# Patient Record
Sex: Female | Born: 1935 | Race: White | Hispanic: No | Marital: Single | Smoking: Never smoker
Health system: Southern US, Community
[De-identification: ages and names within clinical notes are randomized; demographics above are authoritative.]

## PROBLEM LIST (undated history)

## (undated) DIAGNOSIS — M797 Fibromyalgia: Secondary | ICD-10-CM

## (undated) DIAGNOSIS — K219 Gastro-esophageal reflux disease without esophagitis: Secondary | ICD-10-CM

## (undated) DIAGNOSIS — R5382 Chronic fatigue, unspecified: Secondary | ICD-10-CM

## (undated) DIAGNOSIS — M1990 Unspecified osteoarthritis, unspecified site: Secondary | ICD-10-CM

## (undated) DIAGNOSIS — E119 Type 2 diabetes mellitus without complications: Secondary | ICD-10-CM

## (undated) DIAGNOSIS — M810 Age-related osteoporosis without current pathological fracture: Secondary | ICD-10-CM

## (undated) DIAGNOSIS — G4730 Sleep apnea, unspecified: Secondary | ICD-10-CM

## (undated) DIAGNOSIS — M48061 Spinal stenosis, lumbar region without neurogenic claudication: Secondary | ICD-10-CM

## (undated) DIAGNOSIS — I10 Essential (primary) hypertension: Secondary | ICD-10-CM

## (undated) DIAGNOSIS — K222 Esophageal obstruction: Secondary | ICD-10-CM

## (undated) HISTORY — DX: Sleep apnea, unspecified: G47.30

## (undated) HISTORY — DX: Gastro-esophageal reflux disease without esophagitis: K21.9

## (undated) HISTORY — DX: Fibromyalgia: M79.7

## (undated) HISTORY — DX: Essential (primary) hypertension: I10

## (undated) HISTORY — DX: Unspecified osteoarthritis, unspecified site: M19.90

## (undated) HISTORY — PX: ABDOMINAL HYSTERECTOMY: SHX81

## (undated) HISTORY — DX: Esophageal obstruction: K22.2

## (undated) HISTORY — PX: OTHER SURGICAL HISTORY: SHX169

## (undated) HISTORY — PX: ROTATOR CUFF REPAIR: SHX139

## (undated) HISTORY — DX: Type 2 diabetes mellitus without complications: E11.9

## (undated) HISTORY — PX: APPENDECTOMY: SHX54

## (undated) HISTORY — DX: Age-related osteoporosis without current pathological fracture: M81.0

## (undated) HISTORY — DX: Chronic fatigue, unspecified: R53.82

## (undated) HISTORY — DX: Spinal stenosis, lumbar region without neurogenic claudication: M48.061

---

## 2004-06-09 ENCOUNTER — Ambulatory Visit: Payer: Self-pay | Admitting: Internal Medicine

## 2004-07-26 ENCOUNTER — Ambulatory Visit: Payer: Self-pay | Admitting: Internal Medicine

## 2004-08-07 ENCOUNTER — Ambulatory Visit: Payer: Self-pay | Admitting: Internal Medicine

## 2004-09-06 ENCOUNTER — Ambulatory Visit: Payer: Self-pay | Admitting: Internal Medicine

## 2004-10-07 ENCOUNTER — Ambulatory Visit: Payer: Self-pay | Admitting: Internal Medicine

## 2004-12-14 ENCOUNTER — Ambulatory Visit: Payer: Self-pay | Admitting: Internal Medicine

## 2005-06-24 ENCOUNTER — Ambulatory Visit: Payer: Self-pay | Admitting: Internal Medicine

## 2006-06-26 ENCOUNTER — Ambulatory Visit: Payer: Self-pay | Admitting: Internal Medicine

## 2007-01-31 ENCOUNTER — Ambulatory Visit: Payer: Self-pay | Admitting: Internal Medicine

## 2007-06-28 ENCOUNTER — Ambulatory Visit: Payer: Self-pay | Admitting: Internal Medicine

## 2007-08-02 ENCOUNTER — Ambulatory Visit: Payer: Self-pay | Admitting: Podiatry

## 2008-05-20 ENCOUNTER — Ambulatory Visit: Payer: Self-pay | Admitting: Unknown Physician Specialty

## 2008-05-27 ENCOUNTER — Ambulatory Visit: Payer: Self-pay | Admitting: Unknown Physician Specialty

## 2008-06-26 ENCOUNTER — Ambulatory Visit: Payer: Self-pay | Admitting: Unknown Physician Specialty

## 2009-02-04 ENCOUNTER — Ambulatory Visit: Payer: Self-pay | Admitting: Pain Medicine

## 2009-02-11 ENCOUNTER — Ambulatory Visit: Payer: Self-pay | Admitting: Pain Medicine

## 2009-03-17 ENCOUNTER — Ambulatory Visit: Payer: Self-pay | Admitting: Pain Medicine

## 2009-03-23 ENCOUNTER — Ambulatory Visit: Payer: Self-pay | Admitting: Pain Medicine

## 2009-04-01 ENCOUNTER — Emergency Department: Payer: Self-pay | Admitting: Emergency Medicine

## 2009-04-28 ENCOUNTER — Ambulatory Visit: Payer: Self-pay | Admitting: Pain Medicine

## 2009-05-06 ENCOUNTER — Ambulatory Visit: Payer: Self-pay | Admitting: Pain Medicine

## 2009-05-28 ENCOUNTER — Ambulatory Visit: Payer: Self-pay | Admitting: Internal Medicine

## 2009-06-09 ENCOUNTER — Ambulatory Visit: Payer: Self-pay | Admitting: Pain Medicine

## 2009-06-15 ENCOUNTER — Ambulatory Visit: Payer: Self-pay | Admitting: Pain Medicine

## 2009-07-21 ENCOUNTER — Ambulatory Visit: Payer: Self-pay | Admitting: Pain Medicine

## 2009-07-27 ENCOUNTER — Ambulatory Visit: Payer: Self-pay | Admitting: Pain Medicine

## 2009-10-20 ENCOUNTER — Ambulatory Visit: Payer: Self-pay | Admitting: Pain Medicine

## 2009-10-26 ENCOUNTER — Ambulatory Visit: Payer: Self-pay | Admitting: Pain Medicine

## 2009-12-01 ENCOUNTER — Ambulatory Visit: Payer: Self-pay | Admitting: Pain Medicine

## 2010-05-31 ENCOUNTER — Ambulatory Visit: Payer: Self-pay | Admitting: Internal Medicine

## 2010-09-09 ENCOUNTER — Ambulatory Visit: Payer: Self-pay | Admitting: Unknown Physician Specialty

## 2010-10-25 ENCOUNTER — Ambulatory Visit: Payer: Self-pay | Admitting: Pain Medicine

## 2010-11-30 ENCOUNTER — Ambulatory Visit: Payer: Self-pay | Admitting: Pain Medicine

## 2010-12-06 ENCOUNTER — Ambulatory Visit: Payer: Self-pay | Admitting: Pain Medicine

## 2010-12-21 ENCOUNTER — Ambulatory Visit: Payer: Self-pay

## 2010-12-28 ENCOUNTER — Ambulatory Visit: Payer: Self-pay | Admitting: Pain Medicine

## 2010-12-31 ENCOUNTER — Ambulatory Visit: Payer: Self-pay | Admitting: Unknown Physician Specialty

## 2011-01-04 ENCOUNTER — Ambulatory Visit: Payer: Self-pay | Admitting: Unknown Physician Specialty

## 2011-05-25 ENCOUNTER — Ambulatory Visit: Payer: Self-pay | Admitting: Unknown Physician Specialty

## 2011-06-09 ENCOUNTER — Ambulatory Visit: Payer: Self-pay | Admitting: Internal Medicine

## 2011-12-27 ENCOUNTER — Ambulatory Visit: Payer: Self-pay | Admitting: Unknown Physician Specialty

## 2012-01-20 ENCOUNTER — Other Ambulatory Visit: Payer: Self-pay | Admitting: Unknown Physician Specialty

## 2012-01-20 ENCOUNTER — Ambulatory Visit: Payer: Self-pay | Admitting: Unknown Physician Specialty

## 2012-06-13 ENCOUNTER — Ambulatory Visit: Payer: Self-pay | Admitting: Internal Medicine

## 2013-03-11 ENCOUNTER — Ambulatory Visit: Payer: Self-pay | Admitting: Physical Medicine and Rehabilitation

## 2013-07-18 ENCOUNTER — Ambulatory Visit: Payer: Self-pay | Admitting: Ophthalmology

## 2013-07-18 LAB — HEMOGLOBIN: HGB: 12.4 g/dL (ref 12.0–16.0)

## 2013-07-30 ENCOUNTER — Ambulatory Visit: Payer: Self-pay | Admitting: Ophthalmology

## 2013-09-18 DIAGNOSIS — M48061 Spinal stenosis, lumbar region without neurogenic claudication: Secondary | ICD-10-CM | POA: Insufficient documentation

## 2013-09-18 DIAGNOSIS — M48062 Spinal stenosis, lumbar region with neurogenic claudication: Secondary | ICD-10-CM | POA: Insufficient documentation

## 2013-10-03 ENCOUNTER — Ambulatory Visit: Payer: Self-pay | Admitting: Ophthalmology

## 2013-10-03 LAB — HEMOGLOBIN: HGB: 13 g/dL (ref 12.0–16.0)

## 2013-10-08 ENCOUNTER — Ambulatory Visit: Payer: Self-pay | Admitting: Ophthalmology

## 2013-12-23 DIAGNOSIS — E119 Type 2 diabetes mellitus without complications: Secondary | ICD-10-CM | POA: Insufficient documentation

## 2013-12-23 DIAGNOSIS — E538 Deficiency of other specified B group vitamins: Secondary | ICD-10-CM | POA: Insufficient documentation

## 2013-12-23 DIAGNOSIS — I10 Essential (primary) hypertension: Secondary | ICD-10-CM | POA: Insufficient documentation

## 2013-12-23 DIAGNOSIS — G4733 Obstructive sleep apnea (adult) (pediatric): Secondary | ICD-10-CM | POA: Insufficient documentation

## 2014-04-14 DIAGNOSIS — M5030 Other cervical disc degeneration, unspecified cervical region: Secondary | ICD-10-CM | POA: Insufficient documentation

## 2014-08-30 NOTE — Op Note (Signed)
PATIENT NAME:  Loretta Webb, Loretta Webb MR#:  [id number] DATE OF BIRTH:  [birth year]  DATE OF PROCEDURE:  10/08/2013  PREOPERATIVE DIAGNOSIS: Visually significant cataract of the right eye.   POSTOPERATIVE DIAGNOSIS: Visually significant cataract of the right eye.   OPERATIVE PROCEDURE: Cataract extraction by phacoemulsification with implant of intraocular lens to right eye.   SURGEON: Galen ManilaWilliam Easten Maceachern, MD.   ANESTHESIA:  1.  Managed anesthesia care.  2.  Topical tetracaine drops followed by 2% Xylocaine jelly applied in the preoperative holding area.   COMPLICATIONS: None.   TECHNIQUE:  Stop and chop.  DESCRIPTION OF PROCEDURE: The patient was examined and consented in the preoperative holding area where the aforementioned topical anesthesia was applied to the right eye and then brought back to the Operating Room where the right eye was prepped and draped in the usual sterile ophthalmic fashion and a lid speculum was placed. A paracentesis was created with the side port blade and the anterior chamber was filled with viscoelastic. A near clear corneal incision was performed with the steel keratome. A continuous curvilinear capsulorrhexis was performed with a cystotome followed by the capsulorrhexis forceps. Hydrodissection and hydrodelineation were carried out with BSS on a blunt cannula. The lens was removed in a stop and chop technique and the remaining cortical material was removed with the irrigation-aspiration handpiece. The capsular bag was inflated with viscoelastic and the Tecnis ZCB00 22.5-diopter lens, serial number [id number] was placed in the capsular bag without complication. The remaining viscoelastic was removed from the eye with the irrigation-aspiration handpiece. The wounds were hydrated. The anterior chamber was flushed with Miostat and the eye was inflated to physiologic pressure. 0.1 mL of cefuroxime concentration 10 mg/mL was placed in the anterior chamber. The wounds were found to be  water tight. The eye was dressed with Vigamox. The patient was given protective glasses to wear throughout the day and a shield with which to sleep tonight. The patient was also given drops with which to begin a drop regimen today and will follow-up with me in one day.      ____________________________ Loretta FieldWilliam L. Tyrick Dunagan, MD wlp:dmm D: 10/08/2013 21:27:15 ET T: 10/08/2013 21:44:24 ET JOB#: [id number]  cc: Loretta Sandefer L. Reha Martinovich, MD, <Dictator> Loretta FieldWILLIAM L Felecia Stanfill MD ELECTRONICALLY SIGNED 10/09/2013 13:44

## 2014-08-30 NOTE — Op Note (Signed)
PATIENT NAME:  Loretta ChessmanHUMBLE, Kennedy S MR#:  [id number] DATE OF BIRTH:  February 27, 1932  DATE OF PROCEDURE:  07/30/2013  PREOPERATIVE DIAGNOSIS: Visually significant cataract of the left eye.   POSTOPERATIVE DIAGNOSIS: Visually significant cataract of the left eye.   OPERATIVE PROCEDURE: Cataract extraction by phacoemulsification with implant of intraocular lens to the left eye.   SURGEON: Galen ManilaWilliam Della Homan, MD.   ANESTHESIA:  1. Managed anesthesia care.  2. Topical tetracaine drops followed by 2% Xylocaine jelly applied in the preoperative holding area.   COMPLICATIONS: None.   TECHNIQUE:  Stop and chop.   DESCRIPTION OF PROCEDURE: The patient was examined and consented in the preoperative holding area where the aforementioned topical anesthesia was applied to the left eye and then brought back to the Operating Room where the left eye was prepped and draped in the usual sterile ophthalmic fashion and a lid speculum was placed. A paracentesis was created with the side port blade and the anterior chamber was filled with viscoelastic. A near clear corneal incision was performed with the steel keratome. A continuous curvilinear capsulorrhexis was performed with a cystotome followed by the capsulorrhexis forceps. Hydrodissection and hydrodelineation were carried out with BSS on a blunt cannula. The lens was removed in a stop and chop technique and the remaining cortical material was removed with the irrigation-aspiration handpiece. The capsular bag was inflated with viscoelastic and the Tecnis ZCB00 22.0-diopter lens, serial number [id number] was placed in the capsular bag without complication. The remaining viscoelastic was removed from the eye with the irrigation-aspiration handpiece. The wounds were hydrated. The anterior chamber was flushed with Miostat and the eye was inflated to physiologic pressure. 0.1 mL of cefuroxime concentration 10 mg/mL was placed in the anterior chamber. The wounds were found to be  water tight. The eye was dressed with Vigamox. The patient was given protective glasses to wear throughout the day and a shield with which to sleep tonight. The patient was also given drops with which to begin a drop regimen today and will follow-up with me in one day.   ____________________________ Jerilee FieldWilliam L. Kirat Mezquita, MD wlp:gb D: 07/30/2013 22:41:54 ET T: 07/30/2013 23:12:15 ET JOB#: [id number]  cc: [place] L. Kanani Mowbray, MD, <Dictator> Jerilee FieldWILLIAM L Quanetta Truss MD ELECTRONICALLY SIGNED 08/01/2013 11:26

## 2014-09-09 ENCOUNTER — Encounter: Payer: Self-pay | Admitting: Pulmonary Disease

## 2014-11-19 NOTE — Discharge Summary (Signed)
PATIENT NAME:  Loretta ChessmanHUMBLE, Loretta Webb MR#:  [id number] DATE OF BIRTH:  December 02, 1931  DATE OF ADMISSION:  01/20/2012 DATE OF DISCHARGE:  01/20/2012  HISTORY:: The patient is a [age] year old white female who came in for dysphagia to have an upper endoscopy and dilatation. She has a history of diabetes, sleep apnea, and hypertension. No history of intrinsic heart disease. During the evaluation phase, after vital signs were taken and an IV was inserted, and she was hooked up to the monitor, it was noted that she had significant ST depression and T wave inversion.  She was asymptomatic at the time. I consulted Dr. Darrold JunkerParaschos and attempted to contact her primary doctor, Dr. Bethann PunchesMark Miller. Plan was made to cancel the procedure. I gave her a copy of her EKG and the nurses will schedule an appointment to see Dr. Darrold JunkerParaschos next week. He did not feel that she needed urgent attention in the absence of symptoms. ____________________________ Scot Junobert T. Elliott, MD rte:slb D: 01/20/2012 08:59:08 ET T: 01/20/2012 10:06:10 ET JOB#: [id number]  cc: Danella PentonMark F. Miller, MD Marcina MillardAlexander Paraschos, MD Scot Junobert T. Elliott, MD, <Dictator> Scot JunOBERT T ELLIOTT MD ELECTRONICALLY SIGNED 01/23/2012 16:08

## 2015-01-05 DIAGNOSIS — M5116 Intervertebral disc disorders with radiculopathy, lumbar region: Secondary | ICD-10-CM | POA: Insufficient documentation

## 2015-04-08 ENCOUNTER — Encounter: Payer: Self-pay | Admitting: Pulmonary Disease

## 2015-04-08 ENCOUNTER — Encounter (INDEPENDENT_AMBULATORY_CARE_PROVIDER_SITE_OTHER): Payer: Self-pay

## 2015-04-08 ENCOUNTER — Ambulatory Visit (INDEPENDENT_AMBULATORY_CARE_PROVIDER_SITE_OTHER): Payer: Medicare Other | Admitting: Pulmonary Disease

## 2015-04-08 VITALS — BP 122/62 | HR 63 | Ht 64.0 in | Wt 165.0 lb

## 2015-04-08 DIAGNOSIS — R0902 Hypoxemia: Secondary | ICD-10-CM

## 2015-04-08 DIAGNOSIS — R5382 Chronic fatigue, unspecified: Secondary | ICD-10-CM | POA: Diagnosis not present

## 2015-04-08 DIAGNOSIS — I272 Other secondary pulmonary hypertension: Secondary | ICD-10-CM | POA: Diagnosis not present

## 2015-04-08 DIAGNOSIS — G4733 Obstructive sleep apnea (adult) (pediatric): Secondary | ICD-10-CM

## 2015-04-08 NOTE — Progress Notes (Signed)
PULMONARY CONSULT NOTE  Requesting: Self referred  Date of initial consult: 04/08/15 Reason for consultation: Second opinion  HPI:  [age] F who has been followed by Dr Meredeth IdeFleming "for years" for poorly explained dyspnea and OSA first diagnosed 8-10 yrs ago. It seems that the desire for this second opinion is on her daughter's insistence. Ms Ulyses AmorHumble was diagnosed with OSA many years ago and has been intolerant to CPAP. She wears nocturnal O2. She has no symptoms of daytime hypersomnolence but her daughter notes that she is a heavy snorer and the pt does report chronic fatigue limiting her ability to complete all of her daily routine. Iin fact, she carries a diagnosis of chronic fatigue syndrome and fibromyalgia syndrome. She is followed by Dr Bethann PunchesMark Miller for these problems. She has undergone an echocardiogram (apparently to evaluate chronic exertional dyspnea) which revealed pulmonary hypertension and was told by Dr Meredeth IdeFleming that there was no indication for medical therapy. She has undergone overnight oximetry and ambulatory oximetry testing. Her daughter reports that her SpO2 dropped to 92% with ambulation. The results of the overnight oximetry are not known at this time. Her daughter expresses concern that there has been a lack of communication by Dr Meredeth IdeFleming and this too has prompted this "2nd opinion".   Past Medical History  Diagnosis Date  . Hypertension   . Sleep apnea   . DM2 (diabetes mellitus, type 2) (HCC)   . Arthritis   . Fibromyalgia   . Chronic fatigue   . GERD (gastroesophageal reflux disease)   . Lumbar stenosis   . Osteoporosis   . Esophageal stricture     MEDICATIONS: reviewed  Social History   Social History  . Marital Status: Divorced    Spouse Name: N/A  . Number of Children: N/A  . Years of Education: N/A   Occupational History  . Not on file.   Social History Main Topics  . Smoking status: Never Smoker   . Smokeless tobacco: Not on file  . Alcohol Use: No  . Drug  Use: No  . Sexual Activity: Not on file   Other Topics Concern  . Not on file   Social History Narrative  . No narrative on file    Family History  Problem Relation Age of Onset  . Diabetes Father   . Cancer Mother   . Cancer Father   . Cerebral aneurysm Father   . Cancer Sister   . Depression Sister     ROS - negative except as noted above  Filed Vitals:   04/08/15 1037  BP: 122/62  Pulse: 63  Height: 5\' 4"  (1.626 m)  Weight: 165 lb (74.844 kg)  SpO2: 98%    EXAM:   Gen: WDWN in NAD HEENT: All WNL Neck: NO LAN, no JVD noted Lungs: full BS, normal percussion note throughout, no adventitious sounds Cardiovascular: Reg rate, normal rhythm, no M noted Abdomen: Soft, NT +BS Ext: no C/C/E Neuro: CNs intact, motor/sens grossly intact Skin: No lesions noted    DATA:  CBC Latest Ref Rng 10/03/2013 07/18/2013  Hemoglobin 12.0-16.0 g/dL 16.113.0 09.612.4    No flowsheet data found.  No CXR available for review   IMPRESSION:   OSA - suspect mild. Intolerant to CPAP. Symptomatically controlled with nocturnal O2 Borderline hypoxemia - Plan: DG Chest 2 View, Pulmonary function test Pulmonary hypertension - suspect mild and simply an incidental finding. Repeat echocardiogram already ordered for 04/13/15  PLAN:  We will obtain records from Dr Reita ClicheFleming's office and  overnight oximetry report from [facility] Follow up in 4-6 wks with CXR and PFTs Cont current medical regimen and oxygen therapy as prescribed previously   Billy Fischer, MD PCCM service Mobile [phone] Pager [phone]

## 2015-04-24 ENCOUNTER — Ambulatory Visit (INDEPENDENT_AMBULATORY_CARE_PROVIDER_SITE_OTHER): Payer: Medicare Other | Admitting: Sports Medicine

## 2015-04-24 ENCOUNTER — Encounter: Payer: Self-pay | Admitting: Sports Medicine

## 2015-04-24 DIAGNOSIS — M79672 Pain in left foot: Secondary | ICD-10-CM

## 2015-04-24 DIAGNOSIS — M79671 Pain in right foot: Secondary | ICD-10-CM

## 2015-04-24 DIAGNOSIS — B351 Tinea unguium: Secondary | ICD-10-CM | POA: Diagnosis not present

## 2015-04-24 DIAGNOSIS — L84 Corns and callosities: Secondary | ICD-10-CM

## 2015-04-24 DIAGNOSIS — E1142 Type 2 diabetes mellitus with diabetic polyneuropathy: Secondary | ICD-10-CM

## 2015-04-24 NOTE — Progress Notes (Signed)
Patient ID: Loretta Webb, female   DOB: May 24, 1931, 81 y.o.   MRN: [id number] Subjective: Loretta Webb is a 81 y.o. female patient with history of type 2 diabetes who presents to office today complaining of long, painful nails  while ambulating in shoes; unable to trim. Patient states that the glucose reading this morning was not recorded. Patient denies any new changes in medication or new problems. Patient denies any new cramping, numbness, burning or tingling in the legs.  There are no active problems to display for this patient.  Current Outpatient Prescriptions on File Prior to Visit  Medication Sig Dispense Refill  . ALPRAZolam (XANAX) 0.25 MG tablet Take by mouth as needed.    . bumetanide (BUMEX) 1 MG tablet Take by mouth.    . cyanocobalamin (,VITAMIN B-12,) 1000 MCG/ML injection Inject into the muscle.    . DULoxetine (CYMBALTA) 60 MG capsule Take by mouth.    . gabapentin (NEURONTIN) 300 MG capsule Take by mouth.    Marland Kitchen. HYDROcodone-acetaminophen (NORCO/VICODIN) 5-325 MG tablet Take by mouth as needed.    Marland Kitchen. losartan (COZAAR) 50 MG tablet Take by mouth.    . nabumetone (RELAFEN) 500 MG tablet Take by mouth.    . pravastatin (PRAVACHOL) 40 MG tablet Take by mouth.    . propranolol (INDERAL) 20 MG tablet Take by mouth.    . traZODone (DESYREL) 50 MG tablet Take by mouth.     No current facility-administered medications on file prior to visit.   Not on File   Labs: HEMOGLOBIN A1C-No recent labs on file  Objective: General: Patient is awake, alert, and oriented x 3 and in no acute distress.  Integument: Skin is warm, dry and supple bilateral. Nails are tender, long, thickened and  dystrophic with subungual debris, consistent with onychomycosis, 1-5 bilateral. No signs of infection. No open lesions bilateral. There are callousities at the medial hallux bilateral with no signs of infection. Remaining integument unremarkable.  Vasculature:  Dorsalis Pedis pulse 1/4 bilateral.  Posterior Tibial pulse  1/4 bilateral.  Capillary fill time <4 sec 1-5 bilateral. Scant hair growth to the level of the digits. Temperature gradient within normal limits. Mild varicosities present bilateral. No edema present bilateral.   Neurology: The patient has diminished sensation measured with a 5.07/10g Semmes Weinstein Monofilament at all pedal sites bilateral . Vibratory sensation diminished bilateral with tuning fork. No Babinski sign present bilateral.   Musculoskeletal: No gross pedal deformities noted bilateral. Muscular strength 5/5 in all lower extremity muscular groups bilateral without pain or limitation on range of motion . No tenderness with calf compression bilateral.  Assessment and Plan: Problem List Items Addressed This Visit    None    Visit Diagnoses    Callus of foot    -  Primary    Dermatophytosis of nail        Foot pain, bilateral        Diabetic polyneuropathy associated with type 2 diabetes mellitus (HCC)          -Examined patient. -Discussed and educated patient on diabetic foot care, especially with  regards to the vascular, neurological and musculoskeletal systems.  -Stressed the importance of good glycemic control and the detriment of not  controlling glucose levels in relation to the foot. -Mechanically debrided callus x 2 using sterile chisel blade and all nails 1-5 bilateral using sterile nail nipper and filed with dremel without incident  -Answered all patient questions -Patient to return in 3 months for at  risk foot care -Patient advised to call the office if any problems or questions arise in the meantime.  Landis Martins, DPM

## 2015-05-06 ENCOUNTER — Other Ambulatory Visit: Payer: Self-pay | Admitting: Unknown Physician Specialty

## 2015-05-06 DIAGNOSIS — R1310 Dysphagia, unspecified: Secondary | ICD-10-CM

## 2015-05-08 ENCOUNTER — Ambulatory Visit
Admission: RE | Admit: 2015-05-08 | Discharge: 2015-05-08 | Disposition: A | Discharge status: Home / Self Care | Payer: Medicare Other | Source: Ambulatory Visit | Attending: Unknown Physician Specialty | Admitting: Unknown Physician Specialty

## 2015-05-08 ENCOUNTER — Ambulatory Visit
Admission: RE | Admit: 2015-05-08 | Discharge: 2015-05-08 | Disposition: A | Discharge status: Home / Self Care | Payer: Medicare Other | Source: Ambulatory Visit | Attending: Pulmonary Disease | Admitting: Pulmonary Disease

## 2015-05-08 DIAGNOSIS — K449 Diaphragmatic hernia without obstruction or gangrene: Secondary | ICD-10-CM | POA: Diagnosis not present

## 2015-05-08 DIAGNOSIS — K219 Gastro-esophageal reflux disease without esophagitis: Secondary | ICD-10-CM | POA: Diagnosis not present

## 2015-05-08 DIAGNOSIS — R1310 Dysphagia, unspecified: Secondary | ICD-10-CM | POA: Diagnosis not present

## 2015-05-08 DIAGNOSIS — I272 Other secondary pulmonary hypertension: Secondary | ICD-10-CM | POA: Insufficient documentation

## 2015-05-08 DIAGNOSIS — R0902 Hypoxemia: Secondary | ICD-10-CM

## 2015-05-14 ENCOUNTER — Ambulatory Visit (INDEPENDENT_AMBULATORY_CARE_PROVIDER_SITE_OTHER): Payer: Medicare Other | Admitting: *Deleted

## 2015-05-14 DIAGNOSIS — I272 Other secondary pulmonary hypertension: Secondary | ICD-10-CM | POA: Diagnosis not present

## 2015-05-14 DIAGNOSIS — R0902 Hypoxemia: Secondary | ICD-10-CM | POA: Diagnosis not present

## 2015-05-14 LAB — PULMONARY FUNCTION TEST
DL/VA % PRED: 66 %
DL/VA: 3.18 ml/min/mmHg/L
DLCO UNC: 15.8 ml/min/mmHg
DLCO unc % pred: 65 %
FEF 25-75 POST: 2.04 L/s
FEF 25-75 Pre: 1.32 L/sec
FEF2575-%Change-Post: 54 %
FEF2575-%PRED-PRE: 99 %
FEF2575-%Pred-Post: 153 %
FEV1-%Change-Post: 7 %
FEV1-%PRED-PRE: 86 %
FEV1-%Pred-Post: 93 %
FEV1-PRE: 1.64 L
FEV1-Post: 1.76 L
FEV1FVC-%CHANGE-POST: 2 %
FEV1FVC-%PRED-PRE: 108 %
FEV6-%CHANGE-POST: 4 %
FEV6-%PRED-PRE: 86 %
FEV6-%Pred-Post: 90 %
FEV6-Post: 2.17 L
FEV6-Pre: 2.07 L
FEV6FVC-%PRED-POST: 106 %
FEV6FVC-%Pred-Pre: 106 %
FVC-%Change-Post: 4 %
FVC-%PRED-POST: 85 %
FVC-%Pred-Pre: 81 %
FVC-Post: 2.17 L
FVC-Pre: 2.07 L
POST FEV6/FVC RATIO: 100 %
PRE FEV1/FVC RATIO: 79 %
Post FEV1/FVC ratio: 81 %
Pre FEV6/FVC Ratio: 100 %

## 2015-05-14 NOTE — Progress Notes (Signed)
PFT performed today. 

## 2015-05-20 ENCOUNTER — Ambulatory Visit (INDEPENDENT_AMBULATORY_CARE_PROVIDER_SITE_OTHER): Payer: Medicare Other | Admitting: Pulmonary Disease

## 2015-05-20 ENCOUNTER — Encounter: Payer: Self-pay | Admitting: Pulmonary Disease

## 2015-05-20 VITALS — BP 132/82 | HR 68 | Ht 64.0 in | Wt 162.0 lb

## 2015-05-20 DIAGNOSIS — R0600 Dyspnea, unspecified: Secondary | ICD-10-CM

## 2015-05-20 DIAGNOSIS — R0902 Hypoxemia: Secondary | ICD-10-CM

## 2015-05-20 NOTE — Patient Instructions (Addendum)
Your CXR is entirely normal Your lung function measurements reveal no major abnormalities Your echocardiogram reveals excellent heart function  Continue oxygen @ night as you are already doing  Follow up in 4-6 months  If you are hospitalized for any reason that involves your chest or breathing, please have the doctors give us a call

## 2015-05-21 NOTE — Progress Notes (Signed)
PULMONARY OFFICE FOLLOW UP NOTE  Date of initial consult: 04/08/15 Reason for consultation: Second opinion  Initial HPI:  [id number] F who has been followed by Dr Meredeth IdeFleming "for years" for poorly explained dyspnea and OSA first diagnosed 8-10 yrs ago. It seems that the desire for this second opinion is on her daughter's insistence. Loretta Webb was diagnosed with OSA many years ago and has been intolerant to CPAP. She wears nocturnal O2. She has no symptoms of daytime hypersomnolence but her daughter notes that she is a heavy snorer and the pt does report chronic fatigue limiting her ability to complete all of her daily routine. Iin fact, she carries a diagnosis of chronic fatigue syndrome and fibromyalgia syndrome. She is followed by Dr Bethann PunchesMark Miller for these problems. She has undergone an echocardiogram (apparently to evaluate chronic exertional dyspnea) which revealed pulmonary hypertension and was told by Dr Meredeth IdeFleming that there was no indication for medical therapy. She has undergone overnight oximetry and ambulatory oximetry testing. Her daughter reports that her SpO2 dropped to 92% with ambulation. The results of the overnight oximetry are not known at this time. Her daughter expresses concern that there has been a lack of communication by Dr Meredeth IdeFleming and this too has prompted this "2nd opinion".   INITIAL IMPRESSION/PLAN:   Previously diagnosed OSA - suspect mild. Intolerant to CPAP. Symptomatically controlled with nocturnal O2 Borderline hypoxemia - Plan: DG Chest 2 View, Pulmonary function test Pulmonary hypertension - suspect mild and simply an incidental finding. Repeat echocardiogram already ordered for 04/13/15 PLAN:  We will obtain records from Dr Reita ClicheFleming's office and overnight oximetry report from [facility] Follow up in 4-6 wks with CXR and PFTs Cont current medical regimen and oxygen therapy as prescribed previously  SUBJ: Here to review results of CXR, PFTs and TTE. No new complaints.    OBJ:  Filed Vitals:   05/20/15 1007  BP: 132/82  Pulse: 68  Height: 5\' 4"  (1.626 m)  Weight: 162 lb (73.483 kg)  SpO2: 94%    EXAM:   Gen: WDWN in NAD HEENT: All WNL Neck: NO LAN, no JVD noted Lungs: full BS, no adventitious sounds Cardiovascular: Reg rate, normal rhythm, no M noted Abdomen: Soft, NT +BS Ext: no C/C/E Neuro: CNs intact, motor/sens grossly intact    DATA: CXR 04/28/15: normal  TTE 04/13/15: normal LVEF, mild increased in LA size, RVSP estimate 31 mmHg  PFTs 05/14/15: Normal spirometry, normal lung volumes, mild decrease in DlCO  Overnight oximetry previously performed - results not available. ([facility])  IMPRESSION:   OSA - suspect mild. Intolerant to CPAP. Symptomatically controlled with nocturnal O2.  Minimal pulmonary hypertension - insignificant. No further eval or mgmt indicated Nocturnal hypoxemia  PLAN:  Continue nocturnal oxygen supplementation We will obtain results of overnight oximetry which was performed on RA and communicate these to pt's daughter via My Chart per her request ROV 4-6 months or as needed  Billy Fischeravid Zionah Criswell, MD PCCM service Mobile [phone] Pager [phone]

## 2015-07-07 ENCOUNTER — Ambulatory Visit: Payer: Medicare Other | Attending: Physical Medicine and Rehabilitation

## 2015-07-07 ENCOUNTER — Ambulatory Visit: Payer: Medicare Other | Admitting: Physical Therapy

## 2015-07-07 DIAGNOSIS — M7062 Trochanteric bursitis, left hip: Secondary | ICD-10-CM

## 2015-07-07 DIAGNOSIS — M25551 Pain in right hip: Secondary | ICD-10-CM

## 2015-07-07 DIAGNOSIS — R531 Weakness: Secondary | ICD-10-CM | POA: Diagnosis present

## 2015-07-07 DIAGNOSIS — M5416 Radiculopathy, lumbar region: Secondary | ICD-10-CM | POA: Diagnosis present

## 2015-07-07 DIAGNOSIS — R262 Difficulty in walking, not elsewhere classified: Secondary | ICD-10-CM

## 2015-07-07 NOTE — Therapy (Signed)
[place] [facility] PH[facility]ORTS MEDICINE [address], [place], [address] Phone: [phone]   Fax:  [phone]  Physical Therapy Evaluation  Patient Details  Name: Loretta Webb MRN: [id number] Date of Birth: [birth year] Referring Provider: Merri Ray, DO  Encounter Date: 07/07/2015      PT End of Session - 07/07/15 0806    Visit Number 1   Number of Visits 9   Date for PT Re-Evaluation 07/30/15   Authorization Type 1   Authorization Time Period of 10   PT Start Time 0806   PT Stop Time 0908   PT Time Calculation (min) 62 min   Activity Tolerance Patient tolerated treatment well   Behavior During Therapy [facility] for tasks assessed/performed      Past Medical History  Diagnosis Date  . Hypertension   . Sleep apnea   . DM2 (diabetes mellitus, type 2) (HCC)   . Arthritis   . Fibromyalgia   . Chronic fatigue   . GERD (gastroesophageal reflux disease)   . Lumbar stenosis   . Osteoporosis   . Esophageal stricture     Past Surgical History  Procedure Laterality Date  . Knee replacements    . Abdominal hysterectomy    . Rotator cuff repair    . Appendectomy      There were no vitals filed for this visit.  Visit Diagnosis:  Trochanteric bursitis of left hip - Plan: PT plan of care cert/re-cert  Lumbar radiculitis - Plan: PT plan of care cert/re-cert  Right hip pain - Plan: PT plan of care cert/re-cert  Difficulty walking - Plan: PT plan of care cert/re-cert  Weakness - Plan: PT plan of care cert/re-cert      Subjective Assessment - 07/07/15 0815    Subjective Back pain: 0/10 currently (pt sitting on a chair), 8/10 back pain at worst (when standing about 15 minutes). L hip pain: 0/10 currently, 8-10/10 at worst (when pt is standing a little while or walking in a store, getting groceries). R hip pain: 0/10 currently, 7-8/10 (when pt puts pressure on her R foot to get up a step)   Pertinent History Low back and L  hip pain. Had an injection in her L hip last week. Which helped to some extent. Pt states having back and L hip pain for years. Has recently started having R hip pain about a year ago. Has been getting injection for her L hip for years which usually helps. Has not had any injections for her R hip. Pt believes that her back pain occured first. Getting an injection for her back 07/22/2015. Pt denies bowel or bladder problems.  Pain does not wake her up at night (takes [place]).  States that her feet feel  numb from diabetes.    Diagnostic tests Had an MRI several years ago for her back but does not remember the results.    Patient Stated Goals Walk a little bit longer than 5-6 minutes.   Currently in Pain? Yes   Aggravating Factors  Back: standing for a few minutes (such as cooking in the kitchen), walking, bending over to brush her teeth.   Hip: laying on her bed on her back and moves her L or right leg, walking, going up stairs, picking up items from the floor   Pain Relieving Factors heating pad on her back when sitting; hip pain: ibuprofen   Multiple Pain Sites Yes  [facility] PT Assessment - 07/07/15 0808    Assessment   Medical Diagnosis L hip trochanteric bursitis; lumbar radiculitis   Referring Provider Merri Ray, DO   Onset Date/Surgical Date 06/30/15  Date physical therapy order signed   Prior Therapy No known physical therapy for current condition   Precautions   Precaution Comments Possible fall risk   Restrictions   Other Position/Activity Restrictions No known restrictions   Balance Screen   Has the patient fallen in the past 6 months No   Has the patient had a decrease in activity level because of a fear of falling?  No  Pt states having fear of falling.    Is the patient reluctant to leave their home because of a fear of falling?  No   Prior Function   Vocation Requirements PLOF: better able to stand, walk longer distances, negotiate stairs   Observation/Other  Assessments   Observations (+) Slump L LE but does not reproduce symptoms. (-) Obers test bilaterally. Tenderness to L greater trochanter. Slight unsteadiness in standing   Lower Extremity Functional Scale  31/80   Posture/Postural Control   Posture Comments slight R side bend, bilaterally protracted shoulders and neck, forward flexed, decreased bilateral hip extension, slight  lordisis at L2/L3, slight L lateral shift, some unsteadiness with static standing, R iliac crest higher, L foot pronation   AROM   Overall AROM Comments R hip pain reproducted after back movements, pt does not know specific movement that caused it.    Lumbar Flexion limited with most reproduction of low back (sacral) pain   Lumbar Extension very limited with low back pain   Lumbar - Right Side [place] with L posterior hip tightness   Lumbar - Left Side Bend WFL   Lumbar - Right Rotation WFL with low back tightness   Lumbar - Left Rotation very limited   Strength   Right Hip Flexion 4-/5   Right Hip External Rotation  4-/5   Right Hip Internal Rotation 4-/5   Right Hip ABduction 4/5   Left Hip Flexion 4-/5   Left Hip External Rotation 3+/5   Left Hip Internal Rotation 4-/5   Left Hip ABduction 4-/5  with anterior thigh pulling sensation   Right Knee Flexion 4/5   Right Knee Extension 5/5   Left Knee Flexion 4/5   Left Knee Extension 4+/5   Palpation   Palpation comment TTP L posterior greater trochanter. TTP R posterior hip around the greater trochanter and ischial tuberosity.    Ambulation/Gait   Gait Comments antalgic, decreased stance R LE, R lateral lean, forward flexed                           PT Education - 07/07/15 1419    Education provided Yes   Education Details plan of care   Person(s) Educated Patient   Methods Explanation   Comprehension Verbalized understanding             PT Long Term Goals - 07/07/15 1427    PT LONG TERM GOAL #1   Title Patient will have a  decrease in low back pain to 6/10 or less at worst to promote ability to stand and cook as well as to go grocery shopping.    Baseline 8/10 at worst   Time 4   Period Weeks   Status New   PT LONG TERM GOAL #2   Title Patient will have a decrease in  L hip pain to 6/10 or less at worst, and R hip pain to 5/10 or less at worst to promote ability to walk, perform standing tasks, negotiate stairs.    Baseline L hip 10/10 at worst; R hip 8/10 at worst   Time 4   Period Weeks   Status New   PT LONG TERM GOAL #3   Title Patient will improve her LEFS score by at least 9 points as a demonstration of improved function.    Baseline 31/80   Time 4   Period Weeks   Status New   PT LONG TERM GOAL #4   Title Patient will report being able to tolerate standing for at least 23 minutes to promote ability to perform standing tasks such as cooking.    Baseline Pt able to tolerate 15 minutes of standing due to back pain   Time 4   Period Weeks   Status New   PT LONG TERM GOAL #5   Title Patient will improve bilateral hip strength by at least 1/2 MMT grade to help decrease back and hip pain when walking as well as to promote balance.    Time 4   Period Weeks   Status New               Plan - Jul 28, 2015 1420    Clinical Impression Statement Patient is an 82 year old female who came to phyiscal therapy secondary to low back, L hip, and R hip pain. She also presents with altered gait pattern and posture, TTP to bilateral hip around the greater trochanter area and R ischial tuberosity, bilateral hip weakness, L LE neural tension, reproduction of back pain with lumbar movements such as flexion, R side bending and extension; some unsteadiness while standing, and difficulty performing functional tasks such as walking, standing while cooking and going up and down steps. Patient will benefit from skilled physical therapy services to address the aforementioned deficits.    Pt will benefit from skilled  therapeutic intervention in order to improve on the following deficits Pain;Decreased balance;Postural dysfunction;Difficulty walking;Abnormal gait;Decreased strength   Rehab Potential Good   Clinical Impairments Affecting Rehab Potential chronicity of condition, age, pain   PT Frequency 2x / week   PT Duration 4 weeks   PT Treatment/Interventions Manual techniques;Therapeutic activities;Therapeutic exercise;Electrical Stimulation;Aquatic Therapy;Gait training;Functional mobility training;Iontophoresis /ml Dexamethasone;Ultrasound;Patient/family education;Neuromuscular re-education;Balance training   PT Next Visit Plan hip strengthening, thoracic extension, scapular strengthening, femoral control, core strengthening, lumbopelvic stability and control   Consulted and Agree with Plan of Care Patient          G-Codes - 28-Jul-2015 1436    Functional Assessment Tool Used LEFS, patient interview, clinical presentation   Functional Limitation Mobility: Walking and moving around   Mobility: Walking and Moving Around Current Status [phone]) At least 60 percent but less than 80 percent impaired, limited or restricted   Mobility: Walking and Moving Around Goal Status [phone]) At least 40 percent but less than 60 percent impaired, limited or restricted   Mobility: Walking and Moving Around Discharge Status [phone]) --       Problem List There are no active problems to display for this patient.   Thank you for your referral.  Loralyn Freshwater PT, DPT    July 28, 2015, 2:46 PM  [place] [facility] PHYSICAL AND SPORTS MEDICINE [address] [place], [address] Phone: [phone]   Fax:  [phone]  Name: JADZIA IBSEN MRN: [id number] Date of Birth: 02/28/1932

## 2015-07-09 ENCOUNTER — Encounter: Payer: Medicare Other | Admitting: Physical Therapy

## 2015-07-13 ENCOUNTER — Ambulatory Visit: Payer: Medicare Other | Attending: Physical Medicine and Rehabilitation | Admitting: Physical Therapy

## 2015-07-13 DIAGNOSIS — M7062 Trochanteric bursitis, left hip: Secondary | ICD-10-CM | POA: Insufficient documentation

## 2015-07-13 DIAGNOSIS — R531 Weakness: Secondary | ICD-10-CM | POA: Diagnosis present

## 2015-07-13 DIAGNOSIS — R262 Difficulty in walking, not elsewhere classified: Secondary | ICD-10-CM | POA: Diagnosis present

## 2015-07-13 DIAGNOSIS — M25551 Pain in right hip: Secondary | ICD-10-CM | POA: Diagnosis present

## 2015-07-13 DIAGNOSIS — M5416 Radiculopathy, lumbar region: Secondary | ICD-10-CM | POA: Diagnosis present

## 2015-07-13 NOTE — Therapy (Signed)
[place] [facility] PHYS[facility]ORTS MEDICINE [address], [place], [address] Phone: [phone]   Fax:  [phone]  Physical Therapy Treatment  Patient Details  Name: Loretta Webb MRN: [id number] Date of Birth: [birth year] Referring Provider: Merri Ray, DO  Encounter Date: 07/13/2015      PT End of Session - 07/13/15 1037    Visit Number 2   Number of Visits 9   Date for PT Re-Evaluation 07/30/15   PT Start Time 0930   PT Stop Time 1011   PT Time Calculation (min) 41 min   Activity Tolerance Patient tolerated treatment well   Behavior During Therapy [facility] for tasks assessed/performed      Past Medical History  Diagnosis Date  . Hypertension   . Sleep apnea   . DM2 (diabetes mellitus, type 2) (HCC)   . Arthritis   . Fibromyalgia   . Chronic fatigue   . GERD (gastroesophageal reflux disease)   . Lumbar stenosis   . Osteoporosis   . Esophageal stricture     Past Surgical History  Procedure Laterality Date  . Knee replacements    . Abdominal hysterectomy    . Rotator cuff repair    . Appendectomy      There were no vitals filed for this visit.  Visit Diagnosis:  Trochanteric bursitis of left hip  Right hip pain  Difficulty walking  Weakness      Subjective Assessment - 07/13/15 0930    Subjective Patient reports she has pain in her L hip "always" when she walks due to "the bursitis" she has had multiple injections which have not seemed to help.    Pertinent History Low back and L hip pain. Had an injection in her L hip last week. Which helped to some extent. Pt states having back and L hip pain for years. Has recently started having R hip pain about a year ago. Has been getting injection for her L hip for years which usually helps. Has not had any injections for her R hip. Pt believes that her back pain occured first. Getting an injection for her back 07/22/2015. Pt denies bowel or bladder problems.  Pain does  not wake her up at night (takes [place]).  States that her feet feel  numb from diabetes.    Diagnostic tests Had an MRI several years ago for her back but does not remember the results.    Patient Stated Goals Walk a little bit longer than 5-6 minutes.   Currently in Pain? Yes   Pain Score --  She does not rate pain this session, just that there is no pain at rest and she feels some pain as she is walking in her lateral hips.       Gait assessment no R arm swing, R side either lateral shift or Trendelenburg (R shoulder lower than L shoulder). Multiple episodes of posterior loss of balance with poor ability to self correct requiring PT to assist patient.   Sit to stand assessment - unable to complete without significant valgus, multiple attempts (2-3). Placed yellow t-band around knees, 3 sets x 5 repetitions with no hands completed (less valgus at knees noted).   Sidelying clamshells x 10 with bodyweight with significant time spent on cuing for pure hip ER (was lifting feet, poor set up etc.), progressed to 2 sets of 8 repetitions of isometric. X 10 with yellow t-band around knees. Performed bilaterally, mild pain increase in glute med/piriformis on  L side.  Gait training with SPC in RUE, required mod cuing for sequencing with RUE and LLE moving at same time, after patient was able to complete reciprocally, only 1 minor loss of balance noted and patient reported no L hip pain.                             PT Education - 07/13/15 1036    Education provided Yes   Education Details Stressed to patient the need to progress towards strengthening and how her weakness can be contributing to the symptoms she feels in her hips. Use of SPC in the home.    Person(s) Educated Patient   Methods Explanation;Demonstration;Handout   Comprehension Verbalized understanding;Need further instruction;Returned demonstration             PT Long Term Goals - 07/07/15 1427    PT LONG  TERM GOAL #1   Title Patient will have a decrease in low back pain to 6/10 or less at worst to promote ability to stand and cook as well as to go grocery shopping.    Baseline 8/10 at worst   Time 4   Period Weeks   Status New   PT LONG TERM GOAL #2   Title Patient will have a decrease in L hip pain to 6/10 or less at worst, and R hip pain to 5/10 or less at worst to promote ability to walk, perform standing tasks, negotiate stairs.    Baseline L hip 10/10 at worst; R hip 8/10 at worst   Time 4   Period Weeks   Status New   PT LONG TERM GOAL #3   Title Patient will improve her LEFS score by at least 9 points as a demonstration of improved function.    Baseline 31/80   Time 4   Period Weeks   Status New   PT LONG TERM GOAL #4   Title Patient will report being able to tolerate standing for at least 23 minutes to promote ability to perform standing tasks such as cooking.    Baseline Pt able to tolerate 15 minutes of standing due to back pain   Time 4   Period Weeks   Status New   PT LONG TERM GOAL #5   Title Patient will improve bilateral hip strength by at least 1/2 MMT grade to help decrease back and hip pain when walking as well as to promote balance.    Time 4   Period Weeks   Status New               Plan - 07/13/15 1037    Clinical Impression Statement Patient demonstrates significant hip abductor/ER weakness, in this setting of generalized deconditioning and weakness. She responds well to treatment today with no increased pain, and reports of no pain with use of SPC. She also demonstrates poor balance with multiple near falls during gait training secondary to weakness. Patient would benefit from isometric hip abductor/ER strengthening to increase her tolerance for mobility.    Pt will benefit from skilled therapeutic intervention in order to improve on the following deficits Pain;Decreased balance;Postural dysfunction;Difficulty walking;Abnormal gait;Decreased strength    Rehab Potential Good   Clinical Impairments Affecting Rehab Potential chronicity of condition, age, pain   PT Frequency 2x / week   PT Duration 4 weeks   PT Treatment/Interventions Manual techniques;Therapeutic activities;Therapeutic exercise;Electrical Stimulation;Aquatic Therapy;Gait training;Functional mobility training;Iontophoresis 4mg /ml Dexamethasone;Ultrasound;Patient/family education;Neuromuscular re-education;Balance training  PT Next Visit Plan hip strengthening, thoracic extension, scapular strengthening, femoral control, core strengthening, lumbopelvic stability and control   Consulted and Agree with Plan of Care Patient        Problem List There are no active problems to display for this patient.  Kerin Ransom, PT, DPT    07/13/2015, 7:01 PM  [place] [facility] PHYSICAL AND SPORTS MEDICINE [address] [place], [address] Phone: [phone]   Fax:  [phone]  Name: Loretta Webb MRN: [id number] Date of Birth: [birth year]

## 2015-07-16 ENCOUNTER — Ambulatory Visit: Payer: Medicare Other | Admitting: Physical Therapy

## 2015-07-16 DIAGNOSIS — R531 Weakness: Secondary | ICD-10-CM

## 2015-07-16 DIAGNOSIS — R262 Difficulty in walking, not elsewhere classified: Secondary | ICD-10-CM

## 2015-07-16 DIAGNOSIS — M7062 Trochanteric bursitis, left hip: Secondary | ICD-10-CM | POA: Diagnosis not present

## 2015-07-16 DIAGNOSIS — M25551 Pain in right hip: Secondary | ICD-10-CM

## 2015-07-16 NOTE — Therapy (Signed)
[facility] PH[facility]ORTS MEDICINE [address] [place], [address] Phone: [phone]   Fax:  [phone]  Physical Therapy Treatment  Patient Details  Name: Loretta Webb MRN: [id number] Date of Birth: [birth year] Referring Provider: Merri Ray, DO  Encounter Date: 07/16/2015      PT End of Session - 07/16/15 1033    Visit Number 3   Number of Visits 9   Date for PT Re-Evaluation 07/30/15   Authorization Type 3   Authorization Time Period of 10   PT Start Time [phone]   PT Stop Time 1027   PT Time Calculation (min) 41 min   Activity Tolerance Patient tolerated treatment well   Behavior During Therapy [facility] for tasks assessed/performed      Past Medical History  Diagnosis Date  . Hypertension   . Sleep apnea   . DM2 (diabetes mellitus, type 2) (HCC)   . Arthritis   . Fibromyalgia   . Chronic fatigue   . GERD (gastroesophageal reflux disease)   . Lumbar stenosis   . Osteoporosis   . Esophageal stricture     Past Surgical History  Procedure Laterality Date  . Knee replacements    . Abdominal hysterectomy    . Rotator cuff repair    . Appendectomy      There were no vitals filed for this visit.  Visit Diagnosis:  Trochanteric bursitis of left hip  Right hip pain  Difficulty walking  Weakness      Subjective Assessment - 07/16/15 1007    Subjective Patient reports she had difficulty with some of her home exercises but has tried standing without use of her hands consistently and has found it easier. She reports she noticed some improvement in her balance as well.    Pertinent History Low back and L hip pain. Had an injection in her L hip last week. Which helped to some extent. Pt states having back and L hip pain for years. Has recently started having R hip pain about a year ago. Has been getting injection for her L hip for years which usually helps. Has not had any injections for her R hip. Pt believes  that her back pain occured first. Getting an injection for her back 07/22/2015. Pt denies bowel or bladder problems.  Pain does not wake her up at night (takes [place]).  States that her feet feel  numb from diabetes.    Diagnostic tests Had an MRI several years ago for her back but does not remember the results.    Patient Stated Goals Walk a little bit longer than 5-6 minutes.   Currently in Pain? --  Patient does not complain of pain throughout the session aside from the table being too rigid on sidelying clamshells   Aggravating Factors  Going up stairs usually    Pain Relieving Factors Heating pad, ibuprofen.       Sit to stand (without use of hands -- decreased valgus noted) 5x sit to stand in 10.97 seconds. Able to complete 10 consecutive sit to stands without use of hands.   Sidelying hip abductions with RLE (painful in hip and knee) x 6  Sidelying clamshells against manual resistance 3 sets x 10 reptitions (increased resistance from last session).   Standing hip abductions x 6 for 2 sets with 3 second holds each side   Ascending/descending steps with use of UEs on rail, progressed to 1 rail. Able to complete x 4 rounds up  4 steps with no increase in pain, reciprocal stepping.   Single leg stance alternating legs every 5" with HHA (unable to complete without HHA) for 1.5 minutes for two bouts. Appropriate challenge on hip/thigh musculature.                            PT Education - 07/16/15 1032    Education provided Yes   Education Details Progressed HEP to include balance and hip strengthening.    Person(s) Educated Patient   Methods Explanation;Demonstration;Handout   Comprehension Verbalized understanding;Returned demonstration             PT Long Term Goals - 07/07/15 1427    PT LONG TERM GOAL #1   Title Patient will have a decrease in low back pain to 6/10 or less at worst to promote ability to stand and cook as well as to go grocery shopping.     Baseline 8/10 at worst   Time 4   Period Weeks   Status New   PT LONG TERM GOAL #2   Title Patient will have a decrease in L hip pain to 6/10 or less at worst, and R hip pain to 5/10 or less at worst to promote ability to walk, perform standing tasks, negotiate stairs.    Baseline L hip 10/10 at worst; R hip 8/10 at worst   Time 4   Period Weeks   Status New   PT LONG TERM GOAL #3   Title Patient will improve her LEFS score by at least 9 points as a demonstration of improved function.    Baseline 31/80   Time 4   Period Weeks   Status New   PT LONG TERM GOAL #4   Title Patient will report being able to tolerate standing for at least 23 minutes to promote ability to perform standing tasks such as cooking.    Baseline Pt able to tolerate 15 minutes of standing due to back pain   Time 4   Period Weeks   Status New   PT LONG TERM GOAL #5   Title Patient will improve bilateral hip strength by at least 1/2 MMT grade to help decrease back and hip pain when walking as well as to promote balance.    Time 4   Period Weeks   Status New               Plan - 07/16/15 1034    Clinical Impression Statement Patient is able to complete multiple sit to stands today with no increase in pain or discomfort. SHe is able to ascend/descend steps (which she states is typically painful) with no increase in pain today. Patient demonstrates inability to complete SLS without use of UEs in this session and mild compensations in standing hip abductions, which are likley contributing to history of bilateral bursitis.    Pt will benefit from skilled therapeutic intervention in order to improve on the following deficits Pain;Decreased balance;Postural dysfunction;Difficulty walking;Abnormal gait;Decreased strength   Rehab Potential Good   Clinical Impairments Affecting Rehab Potential chronicity of condition, age, pain   PT Frequency 2x / week   PT Duration 4 weeks   PT Treatment/Interventions Manual  techniques;Therapeutic activities;Therapeutic exercise;Electrical Stimulation;Aquatic Therapy;Gait training;Functional mobility training;Iontophoresis 4mg /ml Dexamethasone;Ultrasound;Patient/family education;Neuromuscular re-education;Balance training   PT Next Visit Plan hip strengthening, thoracic extension, scapular strengthening, femoral control, core strengthening, lumbopelvic stability and control   Consulted and Agree with Plan of Care Patient  Problem List There are no active problems to display for this patient.  Kerin Ransom, PT, DPT    07/16/2015, 10:55 AM  [place] [facility] PHYSICAL AND SPORTS MEDICINE [address], [place], [address] Phone: [phone]   Fax:  [phone]  Name: Loretta Webb MRN: [id number] Date of Birth: Aug 30, 1931

## 2015-07-20 ENCOUNTER — Ambulatory Visit: Payer: Medicare Other | Admitting: Physical Therapy

## 2015-07-20 DIAGNOSIS — M7062 Trochanteric bursitis, left hip: Secondary | ICD-10-CM | POA: Diagnosis not present

## 2015-07-20 DIAGNOSIS — M25551 Pain in right hip: Secondary | ICD-10-CM

## 2015-07-20 DIAGNOSIS — R531 Weakness: Secondary | ICD-10-CM

## 2015-07-20 DIAGNOSIS — R262 Difficulty in walking, not elsewhere classified: Secondary | ICD-10-CM

## 2015-07-20 NOTE — Therapy (Signed)
[facility] PHYSICAL AND SPORTS MEDICINE [address] [place], [place], [address] Phone: [phone]   Fax:  [phone]  Physical Therapy Treatment  Patient Details  Name: Loretta Webb MRN: [id number] Date of Birth: [birth year] Referring Provider: Merri RayBenjamin Chasnis, DO  Encounter Date: 07/20/2015      PT End of Session - 07/20/15 1114    Visit Number 4   Number of Visits 9   Date for PT Re-Evaluation 07/30/15   Authorization Type 4   Authorization Time Period of 10   PT Start Time 1030   PT Stop Time 1109   PT Time Calculation (min) 39 min   Activity Tolerance Patient tolerated treatment well   Behavior [facility] for tasks assessed/performed      Past Medical History  Diagnosis Date  . Hypertension   . Sleep apnea   . DM2 (diabetes mellitus, type 2) (HCC)   . Arthritis   . Fibromyalgia   . Chronic fatigue   . GERD (gastroesophageal reflux disease)   . Lumbar stenosis   . Osteoporosis   . Esophageal stricture     Past Surgical History  Procedure Laterality Date  . Knee replacements    . Abdominal hysterectomy    . Rotator cuff repair    . Appendectomy      There were no vitals filed for this visit.  Visit Diagnosis:  Trochanteric bursitis of left hip  Right hip pain  Difficulty walking  Weakness      Subjective Assessment - 07/20/15 1048    Subjective Patient reports she had a headache over the weekend. She reports her pain in her hips is now intermittent or sporadic rather than constant. No back pain today either.    Pertinent History Low back and L hip pain. Had an injection in her L hip last week. Which helped to some extent. Pt states having back and L hip pain for years. Has recently started having R hip pain about a year ago. Has been getting injection for her L hip for years which usually helps. Has not had any injections for her R hip. Pt believes that her back pain occured first. Getting an  injection for her back 07/22/2015. Pt denies bowel or bladder problems.  Pain does not wake her up at night ([place]  States that her feet feel  numb from diabetes.    Diagnostic tests Had an MRI several years ago for her back but does not remember the results.    Patient Stated Goals Walk a little bit longer than 5-6 minutes.   Currently in Pain? No/denies      Supine clamshells with red t-band (2 sets x 10 repetitions) (started with yellow t-band x 10 repetitions)  Sit to stands with BW, Red t-band adjusted footing to allow more posterior chain activity. 3 sets x10 repetitions -- 2 sets with foot cuing and red-tband which increased posterior chain activity)  Mini knee bends on blue foam pad 2 sets x 8 repetitions with HHA   Standing hip abductions on blue foam pad 2 sets x 8 reps with HHA   Single leg stance on blue foam pad alternating legs every 5" for 1 minute x 2 bouts with HHA  Side stepping on blue foam pad 5 laps x 2 repetitions with HHA   No pain with ascending/descending steps reciprocally with HHA x 4 steps  PT Education - 07/20/15 1601    Education provided Yes   Education Details Stressed importance of HEP, educated on DOMS.    Person(s) Educated Patient   Methods Explanation;Demonstration   Comprehension Verbalized understanding;Returned demonstration             PT Long Term Goals - 07/07/15 1427    PT LONG TERM GOAL #1   Title Patient will have a decrease in low back pain to 6/10 or less at worst to promote ability to stand and cook as well as to go grocery shopping.    Baseline 8/10 at worst   Time 4   Period Weeks   Status New   PT LONG TERM GOAL #2   Title Patient will have a decrease in L hip pain to 6/10 or less at worst, and R hip pain to 5/10 or less at worst to promote ability to walk, perform standing tasks, negotiate stairs.    Baseline L hip 10/10 at worst; R hip 8/10 at worst   Time 4    Period Weeks   Status New   PT LONG TERM GOAL #3   Title Patient will improve her LEFS score by at least 9 points as a demonstration of improved function.    Baseline 31/80   Time 4   Period Weeks   Status New   PT LONG TERM GOAL #4   Title Patient will report being able to tolerate standing for at least 23 minutes to promote ability to perform standing tasks such as cooking.    Baseline Pt able to tolerate 15 minutes of standing due to back pain   Time 4   Period Weeks   Status New   PT LONG TERM GOAL #5   Title Patient will improve bilateral hip strength by at least 1/2 MMT grade to help decrease back and hip pain when walking as well as to promote balance.    Time 4   Period Weeks   Status New               Plan - 07/20/15 1114    Pt will benefit from skilled therapeutic intervention in order to improve on the following deficits Pain;Decreased balance;Postural dysfunction;Difficulty walking;Abnormal gait;Decreased strength   Rehab Potential Good   Clinical Impairments Affecting Rehab Potential chronicity of condition, age, pain   PT Frequency 2x / week   PT Duration 4 weeks   PT Treatment/Interventions Manual techniques;Therapeutic activities;Therapeutic exercise;Electrical Stimulation;Aquatic Therapy;Gait training;Functional mobility training;Iontophoresis /ml Dexamethasone;Ultrasound;Patient/family education;Neuromuscular re-education;Balance training   PT Next Visit Plan hip strengthening, thoracic extension, scapular strengthening, femoral control, core strengthening, lumbopelvic stability and control   Consulted and Agree with Plan of Care Patient        Problem List There are no active problems to display for this patient.  Kerin Ransom, PT, DPT    07/20/2015, 4:08 PM  [facility] PHYSICAL AND SPORTS MEDICINE [address], [place], [address] Phone: [phone]   Fax:  [phone]  Name: Loretta Webb MRN: [id number] Date of Birth: 03/21/1932

## 2015-07-23 ENCOUNTER — Ambulatory Visit: Payer: Medicare Other | Admitting: Physical Therapy

## 2015-07-24 ENCOUNTER — Ambulatory Visit: Payer: Medicare Other | Admitting: Sports Medicine

## 2015-07-24 ENCOUNTER — Ambulatory Visit (INDEPENDENT_AMBULATORY_CARE_PROVIDER_SITE_OTHER): Payer: Medicare Other | Admitting: Sports Medicine

## 2015-07-24 ENCOUNTER — Encounter: Payer: Self-pay | Admitting: Sports Medicine

## 2015-07-24 DIAGNOSIS — E1142 Type 2 diabetes mellitus with diabetic polyneuropathy: Secondary | ICD-10-CM

## 2015-07-24 DIAGNOSIS — M79672 Pain in left foot: Secondary | ICD-10-CM | POA: Diagnosis not present

## 2015-07-24 DIAGNOSIS — L84 Corns and callosities: Secondary | ICD-10-CM | POA: Diagnosis not present

## 2015-07-24 DIAGNOSIS — M79671 Pain in right foot: Secondary | ICD-10-CM | POA: Diagnosis not present

## 2015-07-24 DIAGNOSIS — B351 Tinea unguium: Secondary | ICD-10-CM | POA: Diagnosis not present

## 2015-07-24 DIAGNOSIS — R519 Headache, unspecified: Secondary | ICD-10-CM | POA: Insufficient documentation

## 2015-07-24 DIAGNOSIS — R51 Headache: Secondary | ICD-10-CM

## 2015-07-24 NOTE — Progress Notes (Signed)
Patient ID: Loretta ChessmanRichie S More, female   DOB: 1931/09/21, 82 y.o.   MRN: [id number]  Subjective: Loretta Webb is a 82 y.o. female patient with history of type 2 diabetes who presents to office today complaining of long, painful nails  while ambulating in shoes; unable to trim. Patient states that the glucose reading this morning was "good". Patient denies any new changes in medication or new problems. Patient denies any new cramping, numbness, burning or tingling in the legs.  Patient Active Problem List   Diagnosis Date Noted  . Cephalalgia 07/24/2015  . Neuritis or radiculitis due to rupture of lumbar intervertebral disc 01/05/2015  . Degeneration of intervertebral disc of cervical region 04/14/2014  . B12 deficiency 12/23/2013  . Benign essential HTN 12/23/2013  . Type 2 diabetes mellitus (HCC) 12/23/2013  . Obstructive apnea 12/23/2013  . Lumbar canal stenosis 09/18/2013   Current Outpatient Prescriptions on File Prior to Visit  Medication Sig Dispense Refill  . ALPRAZolam (XANAX) 0.25 MG tablet Take by mouth as needed.    . bumetanide (BUMEX) 1 MG tablet Take by mouth.    . cyanocobalamin (,VITAMIN B-12,) 1000 MCG/ML injection Inject into the muscle. Reported on 07/07/2015    . DULoxetine (CYMBALTA) 60 MG capsule Take by mouth.    . gabapentin (NEURONTIN) 300 MG capsule Take by mouth.    Marland Kitchen. HYDROcodone-acetaminophen (NORCO/VICODIN) 5-325 MG tablet Take by mouth as needed.    Marland Kitchen. losartan (COZAAR) 50 MG tablet Take by mouth.    . nabumetone (RELAFEN) 500 MG tablet Take by mouth.    . pravastatin (PRAVACHOL) 40 MG tablet Take by mouth.    . propranolol (INDERAL) 20 MG tablet Take by mouth.    . ranitidine (ZANTAC) 150 MG tablet Take 150 mg by mouth 2 (two) times daily.    . traZODone (DESYREL) 50 MG tablet Take by mouth.     No current facility-administered medications on file prior to visit.   No Known Allergies  Objective: General: Patient is awake, alert, and oriented x 3 and in  no acute distress.  Integument: Skin is warm, dry and supple bilateral. Nails are tender, long, thickened and  dystrophic with subungual debris, consistent with onychomycosis, 1-5 bilateral. No signs of infection. No open lesions bilateral. There are callousities at the medial hallux bilateral L>R with no signs of infection. Remaining integument unremarkable.  Vasculature:  Dorsalis Pedis pulse 1/4 bilateral. Posterior Tibial pulse 1/4 bilateral. Capillary fill time <4 sec 1-5 bilateral. Scant hair growth to the level of the digits.Temperature gradient within normal limits. Mild varicosities present bilateral. No edema present bilateral.   Neurology: The patient has diminished sensation measured with a 5.07/10g Semmes Weinstein Monofilament at all pedal sites bilateral . Vibratory sensation diminished bilateral with tuning fork. No Babinski sign present bilateral.   Musculoskeletal: No gross pedal deformities noted bilateral. Muscular strength 5/5 in all lower extremity muscular groups bilateral without pain or limitation on range of motion . No tenderness with calf compression bilateral.  Assessment and Plan: Problem List Items Addressed This Visit    None    Visit Diagnoses    Dermatophytosis of nail    -  Primary    Callus of foot        Foot pain, bilateral        Diabetic polyneuropathy associated with type 2 diabetes mellitus (HCC)          -Examined patient. -Discussed and educated patient on diabetic foot care, especially with  regards to the vascular, neurological and musculoskeletal systems.  -Stressed the importance of good glycemic control and the detriment of not  controlling glucose levels in relation to the foot. -Mechanically debrided callus x 2 using sterile chisel blade and all nails 1-5 bilateral using sterile nail nipper and filed with dremel without incident  -Answered all patient questions -Patient to return in 3 months for at risk foot care -Patient advised to  call the office if any problems or questions arise in the meantime.  Asencion Islam, DPM

## 2015-07-27 ENCOUNTER — Ambulatory Visit: Payer: Medicare Other | Admitting: Physical Therapy

## 2015-07-27 DIAGNOSIS — M7062 Trochanteric bursitis, left hip: Secondary | ICD-10-CM

## 2015-07-27 DIAGNOSIS — R531 Weakness: Secondary | ICD-10-CM

## 2015-07-27 DIAGNOSIS — M25551 Pain in right hip: Secondary | ICD-10-CM

## 2015-07-27 DIAGNOSIS — R262 Difficulty in walking, not elsewhere classified: Secondary | ICD-10-CM

## 2015-07-28 NOTE — Therapy (Signed)
[place] PHYSICAL AND SPORTS MEDICINE [address] [place], [address] Phone: [phone]   Fax:  [phone]  Physical Therapy Treatment  Patient Details  Name: Loretta Webb MRN: [id number] Date of Birth: 03/23/1932 Referring Provider: Sharlet Salina, DO  Encounter Date: 07/27/2015      PT End of Session - 07/27/15 1058    Visit Number 5   Number of Visits 9   Date for PT Re-Evaluation 07/30/15   Authorization Type 4   Authorization Time Period of 10   PT Start Time 1030   PT Stop Time 1110   PT Time Calculation (min) 40 min   Activity Tolerance Patient tolerated treatment well   Behavior During Therapy [facility] for tasks assessed/performed      Past Medical History  Diagnosis Date  . Hypertension   . Sleep apnea   . DM2 (diabetes mellitus, type 2) ([place])   . Arthritis   . Fibromyalgia   . Chronic fatigue   . GERD (gastroesophageal reflux disease)   . Lumbar stenosis   . Osteoporosis   . Esophageal stricture     Past Surgical History  Procedure Laterality Date  . Knee replacements    . Abdominal hysterectomy    . Rotator cuff repair    . Appendectomy      There were no vitals filed for this visit.  Visit Diagnosis:  Trochanteric bursitis of left hip  Right hip pain  Difficulty walking  Weakness      Subjective Assessment - 07/27/15 1035    Subjective Patient reports she had the injection on Wednesday and was told not to come to her appointment on Thursday. She reports no pain in her hips or lower back since. She is not feeling her best today, she attributes to her allergies.    Pertinent History Low back and L hip pain. Had an injection in her L hip last week. Which helped to some extent. Pt states having back and L hip pain for years. Has recently started having R hip pain about a year ago. Has been getting injection for her L hip for years which usually helps. Has not had any injections for her R hip. Pt  believes that her back pain occured first. Getting an injection for her back 07/22/2015. Pt denies bowel or bladder problems.  Pain does not wake her up at night (takes [place]).  States that her feet feel  numb from diabetes.    Diagnostic tests Had an MRI several years ago for her back but does not remember the results.    Patient Stated Goals Walk a little bit longer than 5-6 minutes.   Currently in Pain? No/denies      Side stepping on blue foam pad (easy) progressed to yellow t-band x 2 laps on side of treadmill (challenging)   Sit to stand without use of hands, continues to show some valgus, though no pain or use of UEs required for 10 repetitions.   LEFS - 44/80  Single leg stance 1 minute x 5" holds with HHA on railing for 2 sets.   Ascending/descending steps with use of rail on LUE while ascending. X 16 steps, no loss of balance or increased pain.   Blue foam pad hip abductions/extensions  (2 sets x 10 repetitions bilaterally)   Hip IR mobilizations x 3 bouts for 30" on each side as IR limited but not painful, ER not limited. Flexion not limited or painful.  PT Education - 07/28/15 1317    Education provided Yes   Education Details Educated patient on progression of PT and time expected to complete therapy.    Person(s) Educated Patient   Methods Explanation;Demonstration   Comprehension Verbalized understanding;Returned demonstration             PT Long Term Goals - 07/27/15 1037    PT LONG TERM GOAL #1   Title Patient will have a decrease in low back pain to 6/10 or less at worst to promote ability to stand and cook as well as to go grocery shopping.    Baseline 8/10 at worst, 0/10 pain since the epidural, patient recognizes this is temporary.    Time 4   Period Weeks   Status Partially Met   PT LONG TERM GOAL #2   Title Patient will have a decrease in L hip pain to 6/10 or less at worst, and R hip pain to 5/10 or  less at worst to promote ability to walk, perform standing tasks, negotiate stairs.    Baseline L hip 10/10 at worst; R hip 8/10 at worst. Similar to back, if she moves RLE "wrong" her L hip catches. No pain with walking since epidural.    Time 4   Period Weeks   Status Partially Met   PT LONG TERM GOAL #3   Title Patient will improve her LEFS score by at least 9 points as a demonstration of improved function.    Baseline 31/80, 44/80 on 3/20   Time 4   Period Weeks   Status Achieved   PT LONG TERM GOAL #4   Title Patient will report being able to tolerate standing for at least 23 minutes to promote ability to perform standing tasks such as cooking.    Baseline Pt able to tolerate 15 minutes of standing due to back pain. On 3/20 reports she is able to cook for 1 hour, begins to have pain when cooking 2-3 hours.    Time 4   Period Weeks   Status Achieved   PT LONG TERM GOAL #5   Title Patient will improve bilateral hip strength by at least 1/2 MMT grade to help decrease back and hip pain when walking as well as to promote balance.    Baseline Hip flexors, ER, IR 5/5    Time 4   Period Weeks   Status Partially Met               Plan - 07/27/15 1058    Clinical Impression Statement Patient struggles to communicate changes in her symptoms, though she reports no additional pain symptoms since her injection. She reports increase in LEFS score and ability to complete ADLs, though she has still not attempted to ascend/descend steps. At this time, she continues to have some balance deficits, and would benefit from several follow up visits to encourage HEP to address her residual balance and mobility deficits.    Pt will benefit from skilled therapeutic intervention in order to improve on the following deficits Pain;Decreased balance;Postural dysfunction;Difficulty walking;Abnormal gait;Decreased strength   Rehab Potential Good   Clinical Impairments Affecting Rehab Potential chronicity of  condition, age, pain   PT Frequency 2x / week   PT Duration 4 weeks   PT Treatment/Interventions Manual techniques;Therapeutic activities;Therapeutic exercise;Electrical Stimulation;Aquatic Therapy;Gait training;Functional mobility training;Iontophoresis '4mg'$ /ml Dexamethasone;Ultrasound;Patient/family education;Neuromuscular re-education;Balance training   PT Next Visit Plan hip strengthening, thoracic extension, scapular strengthening, femoral control, core strengthening, lumbopelvic stability and control   Consulted  and Agree with Plan of Care Patient        Problem List Patient Active Problem List   Diagnosis Date Noted  . Cephalalgia 07/24/2015  . Neuritis or radiculitis due to rupture of lumbar intervertebral disc 01/05/2015  . Degeneration of intervertebral disc of cervical region 04/14/2014  . B12 deficiency 12/23/2013  . Benign essential HTN 12/23/2013  . Type 2 diabetes mellitus ([place]) 12/23/2013  . Obstructive apnea 12/23/2013  . Lumbar canal stenosis 09/18/2013   Kerman Passey, PT, DPT    07/28/2015, 1:26 PM  [facility] PHYSICAL AND SPORTS MEDICINE [address], [place], [address] Phone: [phone]   Fax:  [phone]  Name: Loretta Webb MRN: [id number] Date of Birth: 29-Sep-1931

## 2015-07-30 ENCOUNTER — Ambulatory Visit: Payer: Medicare Other | Admitting: Physical Therapy

## 2015-07-30 DIAGNOSIS — M7062 Trochanteric bursitis, left hip: Secondary | ICD-10-CM | POA: Diagnosis not present

## 2015-07-30 DIAGNOSIS — R262 Difficulty in walking, not elsewhere classified: Secondary | ICD-10-CM

## 2015-07-30 DIAGNOSIS — M5416 Radiculopathy, lumbar region: Secondary | ICD-10-CM

## 2015-07-30 DIAGNOSIS — M25551 Pain in right hip: Secondary | ICD-10-CM

## 2015-07-30 NOTE — Therapy (Signed)
[place] PHYSICAL AND SPORTS MEDICINE [address] [place], [address] Phone: [phone]   Fax:  [phone]  Physical Therapy Treatment  Patient Details  Name: Loretta Webb MRN: [id number] Date of Birth: 01-07-1932 Referring Provider: Sharlet Salina, DO  Encounter Date: 07/30/2015      PT End of Session - 07/30/15 1254    Visit Number 6   Number of Visits 9   Date for PT Re-Evaluation 07/30/15   Authorization Type 5   Authorization Time Period of 10   PT Start Time 0940   PT Stop Time 1025   PT Time Calculation (min) 45 min   Activity Tolerance Patient tolerated treatment well   Behavior During Therapy [facility] for tasks assessed/performed      Past Medical History  Diagnosis Date  . Hypertension   . Sleep apnea   . DM2 (diabetes mellitus, type 2) ([place])   . Arthritis   . Fibromyalgia   . Chronic fatigue   . GERD (gastroesophageal reflux disease)   . Lumbar stenosis   . Osteoporosis   . Esophageal stricture     Past Surgical History  Procedure Laterality Date  . Knee replacements    . Abdominal hysterectomy    . Rotator cuff repair    . Appendectomy      There were no vitals filed for this visit.  Visit Diagnosis:  Trochanteric bursitis of left hip  Right hip pain  Difficulty walking  Lumbar radiculitis      Subjective Assessment - 07/30/15 1008    Subjective Patient continues to report decreased pain in her hips as well as the ability to ascend/descend the steps in her house without complaint. She would like a few more sessions to solidify her HEP.    Pertinent History Low back and L hip pain. Had an injection in her L hip last week. Which helped to some extent. Pt states having back and L hip pain for years. Has recently started having R hip pain about a year ago. Has been getting injection for her L hip for years which usually helps. Has not had any injections for her R hip. Pt believes that her back  pain occured first. Getting an injection for her back 07/22/2015. Pt denies bowel or bladder problems.  Pain does not wake her up at night (takes [place]).  States that her feet feel  numb from diabetes.    Diagnostic tests Had an MRI several years ago for her back but does not remember the results.    Patient Stated Goals Walk a little bit longer than 5-6 minutes.   Currently in Pain? No/denies  Patient reports she is feeling pretty good today.       Standing hip extensions x 15 repetitions for 2 sets  Side stepping x 12 repetitions for 2 rounds for 2 sets bilaterally   Sit to stand with red t-band around knees x 8, 10, 10   Step ups to single step x 12 for 2 sets bilaterally     ** Patient required prolonged rest breaks secondary to fatigue throughout session.                         PT Education - 07/30/15 1254    Education provided Yes   Education Details Progress with therapy, how to commit to an exercise program, and how to progress exercise program.    Person(s) Educated Patient   Methods  Explanation;Demonstration;Handout   Comprehension Returned demonstration;Verbalized understanding             PT Long Term Goals - 07/27/15 1037    PT LONG TERM GOAL #1   Title Patient will have a decrease in low back pain to 6/10 or less at worst to promote ability to stand and cook as well as to go grocery shopping.    Baseline 8/10 at worst, 0/10 pain since the epidural, patient recognizes this is temporary.    Time 4   Period Weeks   Status Partially Met   PT LONG TERM GOAL #2   Title Patient will have a decrease in L hip pain to 6/10 or less at worst, and R hip pain to 5/10 or less at worst to promote ability to walk, perform standing tasks, negotiate stairs.    Baseline L hip 10/10 at worst; R hip 8/10 at worst. Similar to back, if she moves RLE "wrong" her L hip catches. No pain with walking since epidural.    Time 4   Period Weeks   Status Partially Met    PT LONG TERM GOAL #3   Title Patient will improve her LEFS score by at least 9 points as a demonstration of improved function.    Baseline 31/80, 44/80 on 3/20   Time 4   Period Weeks   Status Achieved   PT LONG TERM GOAL #4   Title Patient will report being able to tolerate standing for at least 23 minutes to promote ability to perform standing tasks such as cooking.    Baseline Pt able to tolerate 15 minutes of standing due to back pain. On 3/20 reports she is able to cook for 1 hour, begins to have pain when cooking 2-3 hours.    Time 4   Period Weeks   Status Achieved   PT LONG TERM GOAL #5   Title Patient will improve bilateral hip strength by at least 1/2 MMT grade to help decrease back and hip pain when walking as well as to promote balance.    Baseline Hip flexors, ER, IR 5/5    Time 4   Period Weeks   Status Partially Met               Plan - 07/30/15 1255    Clinical Impression Statement Patient has now been able to ascend/descend her steps at home with  no adverse reactions. She continues to struggle with SLS, however significant improvement in her ability to perform sit to stands without use of UEs and ascending/descending steps reciprocally.    Pt will benefit from skilled therapeutic intervention in order to improve on the following deficits Pain;Decreased balance;Postural dysfunction;Difficulty walking;Abnormal gait;Decreased strength   Rehab Potential Good   Clinical Impairments Affecting Rehab Potential chronicity of condition, age, pain   PT Frequency 2x / week   PT Duration 4 weeks   PT Treatment/Interventions Manual techniques;Therapeutic activities;Therapeutic exercise;Electrical Stimulation;Aquatic Therapy;Gait training;Functional mobility training;Iontophoresis 46m/ml Dexamethasone;Ultrasound;Patient/family education;Neuromuscular re-education;Balance training   PT Next Visit Plan hip strengthening, thoracic extension, scapular strengthening, femoral  control, core strengthening, lumbopelvic stability and control   Consulted and Agree with Plan of Care Patient        Problem List Patient Active Problem List   Diagnosis Date Noted  . Cephalalgia 07/24/2015  . Neuritis or radiculitis due to rupture of lumbar intervertebral disc 01/05/2015  . Degeneration of intervertebral disc of cervical region 04/14/2014  . B12 deficiency 12/23/2013  . Benign essential HTN  12/23/2013  . Type 2 diabetes mellitus ([place]) 12/23/2013  . Obstructive apnea 12/23/2013  . Lumbar canal stenosis 09/18/2013    Kerman Passey, PT, DPT    07/30/2015, 12:57 PM  [place] [facility] [address], [place], [address] Phone: [phone]   Fax:  [phone]  Name: KRYSTIAN FERRENTINO MRN: [id number] Date of Birth: 09/21/1931

## 2015-08-03 ENCOUNTER — Ambulatory Visit: Payer: Medicare Other | Admitting: Physical Therapy

## 2015-08-03 DIAGNOSIS — M7062 Trochanteric bursitis, left hip: Secondary | ICD-10-CM | POA: Diagnosis not present

## 2015-08-03 DIAGNOSIS — R262 Difficulty in walking, not elsewhere classified: Secondary | ICD-10-CM

## 2015-08-03 DIAGNOSIS — M25551 Pain in right hip: Secondary | ICD-10-CM

## 2015-08-03 DIAGNOSIS — M5416 Radiculopathy, lumbar region: Secondary | ICD-10-CM

## 2015-08-04 ENCOUNTER — Encounter: Payer: Medicare Other | Admitting: Physical Therapy

## 2015-08-04 NOTE — Therapy (Signed)
[place] [facility] [address] [place], [address] Phone: [phone]   Fax:  [phone]  Physical Therapy Treatment  Patient Details  Name: Loretta Webb MRN: [id number] Date of Birth: 1931/10/16 Referring Provider: Sharlet Salina, DO  Encounter Date: 08/03/2015      PT End of Session - 08/03/15 0946    Visit Number 7   Number of Visits 9   Date for PT Re-Evaluation 08/10/15   Authorization Type 6   Authorization Time Period of 10   PT Start Time 0858   PT Stop Time 0940   PT Time Calculation (min) 42 min   Activity Tolerance Patient tolerated treatment well   Behavior During Therapy [facility] for tasks assessed/performed      Past Medical History  Diagnosis Date  . Hypertension   . Sleep apnea   . DM2 (diabetes mellitus, type 2) ([place])   . Arthritis   . Fibromyalgia   . Chronic fatigue   . GERD (gastroesophageal reflux disease)   . Lumbar stenosis   . Osteoporosis   . Esophageal stricture     Past Surgical History  Procedure Laterality Date  . Knee replacements    . Abdominal hysterectomy    . Rotator cuff repair    . Appendectomy      There were no vitals filed for this visit.  Visit Diagnosis:  Trochanteric bursitis of left hip  Right hip pain  Difficulty walking  Lumbar radiculitis      Subjective Assessment - 08/03/15 0942    Subjective Patient reports she was able to go up and down a hill over the weekend with her daughter while on a trip. She had some L hip pain/discomfort on Saturday morning, which resolved itself. She has been able to complete her HEP without complaint.    Pertinent History Low back and L hip pain. Had an injection in her L hip last week. Which helped to some extent. Pt states having back and L hip pain for years. Has recently started having R hip pain about a year ago. Has been getting injection for her L hip for years which usually helps. Has not had any  injections for her R hip. Pt believes that her back pain occured first. Getting an injection for her back 07/22/2015. Pt denies bowel or bladder problems.  Pain does not wake her up at night (takes [place]).  States that her feet feel  numb from diabetes.    Diagnostic tests Had an MRI several years ago for her back but does not remember the results.    Patient Stated Goals Walk a little bit longer than 5-6 minutes.   Currently in Pain? --  She reports some discomfort in her L hip and mid back.      TherEx DGI Vertical head turns - 2 Horizontal - 2 Steps - 2 Level surface - 3 Change in speed - 3 Over Obstacle - 1 Around Obstacle - 2 Pivot - 3   Assessed Single leg stance balance, able to complete 5-10" of balance once she has used HHA to get to SLS, unable to independently attain SLS  Standing hip abductions x 10 on blue foam pad x 2 sets with HHA for balance support.  Educated patient to use yellow t-band for sit to stand home exercise rather than red.  Step ups to blue foam pad x 10 for 2 sets with HHA (lateral step ups) challenging, but well tolerated.   **  Continue to target glute med strength given her history of bursitis and Trendelenburg pattern.                            PT Education - 08/03/15 0945    Education provided Yes   Education Details HEP and discharge after next session.    Person(s) Educated Patient   Methods Explanation;Demonstration;Handout   Comprehension Verbalized understanding;Returned demonstration             PT Long Term Goals - 07/27/15 1037    PT LONG TERM GOAL #1   Title Patient will have a decrease in low back pain to 6/10 or less at worst to promote ability to stand and cook as well as to go grocery shopping.    Baseline 8/10 at worst, 0/10 pain since the epidural, patient recognizes this is temporary.    Time 4   Period Weeks   Status Partially Met   PT LONG TERM GOAL #2   Title Patient will have a decrease in L  hip pain to 6/10 or less at worst, and R hip pain to 5/10 or less at worst to promote ability to walk, perform standing tasks, negotiate stairs.    Baseline L hip 10/10 at worst; R hip 8/10 at worst. Similar to back, if she moves RLE "wrong" her L hip catches. No pain with walking since epidural.    Time 4   Period Weeks   Status Partially Met   PT LONG TERM GOAL #3   Title Patient will improve her LEFS score by at least 9 points as a demonstration of improved function.    Baseline 31/80, 44/80 on 3/20   Time 4   Period Weeks   Status Achieved   PT LONG TERM GOAL #4   Title Patient will report being able to tolerate standing for at least 23 minutes to promote ability to perform standing tasks such as cooking.    Baseline Pt able to tolerate 15 minutes of standing due to back pain. On 3/20 reports she is able to cook for 1 hour, begins to have pain when cooking 2-3 hours.    Time 4   Period Weeks   Status Achieved   PT LONG TERM GOAL #5   Title Patient will improve bilateral hip strength by at least 1/2 MMT grade to help decrease back and hip pain when walking as well as to promote balance.    Baseline Hip flexors, ER, IR 5/5    Time 4   Period Weeks   Status Partially Met               Plan - 08/03/15 0946    Clinical Impression Statement Patient continues to report decreased L hip pain throughout the day, with only sporadic/intermittent pain now. She has been able to ascend steps, a hill, and perform HEP/walking program without complaints. Her DGI score of 18/24 indicates she is still at risk for falling, though it appears her deficit is primarily a result of decreased SLS.    Pt will benefit from skilled therapeutic intervention in order to improve on the following deficits Pain;Decreased balance;Postural dysfunction;Difficulty walking;Abnormal gait;Decreased strength   Rehab Potential Good   Clinical Impairments Affecting Rehab Potential chronicity of condition, age, pain    PT Frequency 2x / week   PT Duration 4 weeks   PT Treatment/Interventions Manual techniques;Therapeutic activities;Therapeutic exercise;Electrical Stimulation;Aquatic Therapy;Gait training;Functional mobility training;Iontophoresis '4mg'$ /ml Dexamethasone;Ultrasound;Patient/family education;Neuromuscular re-education;Balance  training   PT Next Visit Plan hip strengthening, thoracic extension, scapular strengthening, femoral control, core strengthening, lumbopelvic stability and control   Consulted and Agree with Plan of Care Patient          G-Codes - 2015/09/02 1147    Functional Assessment Tool Used LEFS, patient interview, clinical presentation, DGI    Functional Limitation Mobility: Walking and moving around   Mobility: Walking and Moving Around Current Status [phone]) At least 20 percent but less than 40 percent impaired, limited or restricted   Mobility: Walking and Moving Around Goal Status [phone]) At least 1 percent but less than 20 percent impaired, limited or restricted      Problem List Patient Active Problem List   Diagnosis Date Noted  . Cephalalgia 07/24/2015  . Neuritis or radiculitis due to rupture of lumbar intervertebral disc 01/05/2015  . Degeneration of intervertebral disc of cervical region 04/14/2014  . B12 deficiency 12/23/2013  . Benign essential HTN 12/23/2013  . Type 2 diabetes mellitus ([place]) 12/23/2013  . Obstructive apnea 12/23/2013  . Lumbar canal stenosis 09/18/2013   Kerman Passey, PT, DPT    September 02, 2015, 11:48 AM  [place] [facility] [address], [place], [address] Phone: [phone]   Fax:  [phone]  Name: Loretta Webb MRN: [id number] Date of Birth: 04-19-1932

## 2015-08-06 ENCOUNTER — Ambulatory Visit: Payer: Medicare Other | Admitting: Physical Therapy

## 2015-08-06 DIAGNOSIS — M7062 Trochanteric bursitis, left hip: Secondary | ICD-10-CM

## 2015-08-06 DIAGNOSIS — M5416 Radiculopathy, lumbar region: Secondary | ICD-10-CM

## 2015-08-06 DIAGNOSIS — M25551 Pain in right hip: Secondary | ICD-10-CM

## 2015-08-06 DIAGNOSIS — R262 Difficulty in walking, not elsewhere classified: Secondary | ICD-10-CM

## 2015-08-06 NOTE — Therapy (Signed)
[place] [facility] [address], [place], [address] Phone: [phone]   Fax:  [phone]  Physical Therapy Treatment  Patient Details  Name: Loretta Webb MRN: [id number] Date of Birth: 01/11/1932 Referring Provider: Sharlet Salina, DO  Encounter Date: 08/06/2015      PT End of Session - 08/06/15 1026    Visit Number 8   Number of Visits 9   Date for PT Re-Evaluation 08/10/15   Authorization Type 7   Authorization Time Period of 10   PT Start Time [phone]   PT Stop Time 1022   PT Time Calculation (min) 35 min   Activity Tolerance Patient tolerated treatment well   Behavior During Therapy [facility] for tasks assessed/performed      Past Medical History  Diagnosis Date  . Hypertension   . Sleep apnea   . DM2 (diabetes mellitus, type 2) ([place])   . Arthritis   . Fibromyalgia   . Chronic fatigue   . GERD (gastroesophageal reflux disease)   . Lumbar stenosis   . Osteoporosis   . Esophageal stricture     Past Surgical History  Procedure Laterality Date  . Knee replacements    . Abdominal hysterectomy    . Rotator cuff repair    . Appendectomy      There were no vitals filed for this visit.  Visit Diagnosis:  Trochanteric bursitis of left hip  Right hip pain  Difficulty walking  Lumbar radiculitis      Subjective Assessment - 08/06/15 1020    Subjective Patient reports she gets some pain in her thighs first ting in the morning but it goes away quickly. She was able to do her HEP, with no complaints.    Pertinent History Low back and L hip pain. Had an injection in her L hip last week. Which helped to some extent. Pt states having back and L hip pain for years. Has recently started having R hip pain about a year ago. Has been getting injection for her L hip for years which usually helps. Has not had any injections for her R hip. Pt believes that her back pain occured first. Getting an injection for  her back 07/22/2015. Pt denies bowel or bladder problems.  Pain does not wake her up at night (takes [place]).  States that her feet feel  numb from diabetes.    Diagnostic tests Had an MRI several years ago for her back but does not remember the results.    Patient Stated Goals Walk a little bit longer than 5-6 minutes.   Currently in Pain? No/denies      Supine bridging x8 for 2 sets (she had pain/cramping if she tried to sustain holds, had her just go through the movement).   L piriformis stretching 3 bouts x 30" with relief of symptoms from supine bridging   Side step ups to blue foam pad x 10 for 2 sets with no HHA (she noticed weakness in her ankle, and how this exercise would be helpful)   ** Patient educated extensively on home program, to continue walking program to continue to develop strength and stamina to reduce dependence on lumbar extensors.                          PT Education - 08/06/15 1025    Education provided Yes   Education Details Discharge HEP, to know if anything comes up she  can return as needed.    Person(s) Educated Patient   Methods Explanation;Demonstration;Handout   Comprehension Verbalized understanding;Returned demonstration             PT Long Term Goals - 07/27/15 1037    PT LONG TERM GOAL #1   Title Patient will have a decrease in low back pain to 6/10 or less at worst to promote ability to stand and cook as well as to go grocery shopping.    Baseline 8/10 at worst, 0/10 pain since the epidural, patient recognizes this is temporary.    Time 4   Period Weeks   Status Partially Met   PT LONG TERM GOAL #2   Title Patient will have a decrease in L hip pain to 6/10 or less at worst, and R hip pain to 5/10 or less at worst to promote ability to walk, perform standing tasks, negotiate stairs.    Baseline L hip 10/10 at worst; R hip 8/10 at worst. Similar to back, if she moves RLE "wrong" her L hip catches. No pain with walking  since epidural.    Time 4   Period Weeks   Status Partially Met   PT LONG TERM GOAL #3   Title Patient will improve her LEFS score by at least 9 points as a demonstration of improved function.    Baseline 31/80, 44/80 on 3/20   Time 4   Period Weeks   Status Achieved   PT LONG TERM GOAL #4   Title Patient will report being able to tolerate standing for at least 23 minutes to promote ability to perform standing tasks such as cooking.    Baseline Pt able to tolerate 15 minutes of standing due to back pain. On 3/20 reports she is able to cook for 1 hour, begins to have pain when cooking 2-3 hours.    Time 4   Period Weeks   Status Achieved   PT LONG TERM GOAL #5   Title Patient will improve bilateral hip strength by at least 1/2 MMT grade to help decrease back and hip pain when walking as well as to promote balance.    Baseline Hip flexors, ER, IR 5/5    Time 4   Period Weeks   Status Partially Met               Plan - 08/06/15 1027    Clinical Impression Statement Patient reports she is able to ascend/descend her steps at home with no discomfort several times a day, her symptoms have reduced markedly since her evaluation. She also demonstrates significantly improved balance measures from baseline. She is comfortable and independent with her HEP and appears to be appropriate for discharge.    Pt will benefit from skilled therapeutic intervention in order to improve on the following deficits Pain;Decreased balance;Postural dysfunction;Difficulty walking;Abnormal gait;Decreased strength   Rehab Potential Good   Clinical Impairments Affecting Rehab Potential chronicity of condition, age, pain   PT Frequency 2x / week   PT Duration 4 weeks   PT Treatment/Interventions Manual techniques;Therapeutic activities;Therapeutic exercise;Electrical Stimulation;Aquatic Therapy;Gait training;Functional mobility training;Iontophoresis 65m/ml Dexamethasone;Ultrasound;Patient/family  education;Neuromuscular re-education;Balance training   PT Next Visit Plan hip strengthening, thoracic extension, scapular strengthening, femoral control, core strengthening, lumbopelvic stability and control   Consulted and Agree with Plan of Care Patient        Problem List Patient Active Problem List   Diagnosis Date Noted  . Cephalalgia 07/24/2015  . Neuritis or radiculitis due to rupture of lumbar intervertebral  disc 01/05/2015  . Degeneration of intervertebral disc of cervical region 04/14/2014  . B12 deficiency 12/23/2013  . Benign essential HTN 12/23/2013  . Type 2 diabetes mellitus ([place]) 12/23/2013  . Obstructive apnea 12/23/2013  . Lumbar canal stenosis 09/18/2013   Kerman Passey, PT, DPT    08/06/2015, 2:18 PM  [facility] [address], [place], [address] Phone: [phone]   Fax:  [phone]  Name: Loretta Webb MRN: [id number] Date of Birth: Sep 08, 1931

## 2015-09-11 ENCOUNTER — Encounter: Payer: Self-pay | Admitting: Emergency Medicine

## 2015-09-11 ENCOUNTER — Emergency Department: Payer: Medicare Other

## 2015-09-11 ENCOUNTER — Emergency Department
Admission: EM | Admit: 2015-09-11 | Discharge: 2015-09-11 | Disposition: A | Discharge status: Home / Self Care | Payer: Medicare Other | Attending: Emergency Medicine | Admitting: Emergency Medicine

## 2015-09-11 DIAGNOSIS — I10 Essential (primary) hypertension: Secondary | ICD-10-CM | POA: Insufficient documentation

## 2015-09-11 DIAGNOSIS — E119 Type 2 diabetes mellitus without complications: Secondary | ICD-10-CM | POA: Insufficient documentation

## 2015-09-11 DIAGNOSIS — R109 Unspecified abdominal pain: Secondary | ICD-10-CM | POA: Diagnosis present

## 2015-09-11 DIAGNOSIS — M810 Age-related osteoporosis without current pathological fracture: Secondary | ICD-10-CM | POA: Insufficient documentation

## 2015-09-11 DIAGNOSIS — N289 Disorder of kidney and ureter, unspecified: Secondary | ICD-10-CM | POA: Diagnosis not present

## 2015-09-11 DIAGNOSIS — Z79899 Other long term (current) drug therapy: Secondary | ICD-10-CM | POA: Diagnosis not present

## 2015-09-11 DIAGNOSIS — K5641 Fecal impaction: Secondary | ICD-10-CM

## 2015-09-11 DIAGNOSIS — Z96659 Presence of unspecified artificial knee joint: Secondary | ICD-10-CM | POA: Insufficient documentation

## 2015-09-11 DIAGNOSIS — Z7984 Long term (current) use of oral hypoglycemic drugs: Secondary | ICD-10-CM | POA: Diagnosis not present

## 2015-09-11 DIAGNOSIS — M5030 Other cervical disc degeneration, unspecified cervical region: Secondary | ICD-10-CM | POA: Diagnosis not present

## 2015-09-11 DIAGNOSIS — M1990 Unspecified osteoarthritis, unspecified site: Secondary | ICD-10-CM | POA: Diagnosis not present

## 2015-09-11 DIAGNOSIS — K5901 Slow transit constipation: Secondary | ICD-10-CM | POA: Diagnosis not present

## 2015-09-11 DIAGNOSIS — K5900 Constipation, unspecified: Secondary | ICD-10-CM | POA: Insufficient documentation

## 2015-09-11 LAB — CBC
HCT: 37.1 % (ref 35.0–47.0)
HEMOGLOBIN: 12.4 g/dL (ref 12.0–16.0)
MCH: 27.7 pg (ref 26.0–34.0)
MCHC: 33.5 g/dL (ref 32.0–36.0)
MCV: 82.7 fL (ref 80.0–100.0)
Platelets: 332 10*3/uL (ref 150–440)
RBC: 4.49 MIL/uL (ref 3.80–5.20)
RDW: 15.1 % — ABNORMAL HIGH (ref 11.5–14.5)
WBC: 8.6 10*3/uL (ref 3.6–11.0)

## 2015-09-11 LAB — COMPREHENSIVE METABOLIC PANEL
ALBUMIN: 4.3 g/dL (ref 3.5–5.0)
ALK PHOS: 38 U/L (ref 38–126)
ALT: 15 U/L (ref 14–54)
ANION GAP: 14 (ref 5–15)
AST: 29 U/L (ref 15–41)
BUN: 24 mg/dL — ABNORMAL HIGH (ref 6–20)
CALCIUM: 9.8 mg/dL (ref 8.9–10.3)
CO2: 22 mmol/L (ref 22–32)
Chloride: 104 mmol/L (ref 101–111)
Creatinine, Ser: 1.15 mg/dL — ABNORMAL HIGH (ref 0.44–1.00)
GFR calc Af Amer: 50 mL/min — ABNORMAL LOW (ref >60)
GFR calc non Af Amer: 43 mL/min — ABNORMAL LOW (ref >60)
GLUCOSE: 213 mg/dL — AB (ref 65–99)
Potassium: 3.5 mmol/L (ref 3.5–5.1)
SODIUM: 140 mmol/L (ref 135–145)
Total Bilirubin: 1 mg/dL (ref 0.3–1.2)
Total Protein: 7.6 g/dL (ref 6.5–8.1)

## 2015-09-11 LAB — LIPASE, BLOOD: Lipase: 42 U/L (ref 11–51)

## 2015-09-11 MED ORDER — ONDANSETRON HCL 4 MG/2ML IJ SOLN
4.0000 mg | Freq: Once | INTRAMUSCULAR | Status: AC
  Administered 2015-09-11: 4 mg via INTRAVENOUS
  Filled 2015-09-11: qty 2

## 2015-09-11 MED ORDER — KETOROLAC TROMETHAMINE 30 MG/ML IJ SOLN
30.0000 mg | Freq: Once | INTRAMUSCULAR | Status: AC
  Administered 2015-09-11: 30 mg via INTRAVENOUS
  Filled 2015-09-11: qty 1

## 2015-09-11 MED ORDER — MORPHINE SULFATE (PF) 2 MG/ML IV SOLN
2.0000 mg | Freq: Once | INTRAVENOUS | Status: AC
  Administered 2015-09-11: 2 mg via INTRAVENOUS
  Filled 2015-09-11: qty 1

## 2015-09-11 MED ORDER — LACTULOSE 10 GM/15ML PO SOLN
20.0000 g | Freq: Once | ORAL | Status: AC
  Administered 2015-09-11: 20 g via ORAL
  Filled 2015-09-11: qty 30

## 2015-09-11 MED ORDER — LACTULOSE 10 GM/15ML PO SOLN: 10.0000 g | Freq: Every day | ORAL | Status: DC | PRN

## 2015-09-11 MED ORDER — KETOROLAC TROMETHAMINE 60 MG/2ML IM SOLN: 60.0000 mg | Freq: Once | INTRAMUSCULAR | Status: DC

## 2015-09-11 MED ORDER — MINERAL OIL RE ENEM
1.0000 | ENEMA | Freq: Once | RECTAL | Status: AC
  Administered 2015-09-11: 1 via RECTAL

## 2015-09-11 NOTE — Discharge Instructions (Signed)
Please continue to take your daily laxative and stool softener, as prescribed by your primary care doctor.  Drink plenty of fluids, eat a high fiber diet, and stay physically active to prevent further constipation.  Return to the emergency department for severe pain, inability to keep down fluids, fever, vomiting, or any other symptoms concerning to you.   Constipation, Adult Constipation is when a person:  Poops (has a bowel movement) less than 3 times a week.  Has a hard time pooping.  Has poop that is dry, hard, or bigger than normal. HOME CARE   Eat foods with a lot of fiber in them. This includes fruits, vegetables, beans, and whole grains such as brown rice.  Avoid fatty foods and foods with a lot of sugar. This includes french fries, hamburgers, cookies, candy, and soda.  If you are not getting enough fiber from food, take products with added fiber in them (supplements).  Drink enough fluid to keep your pee (urine) clear or pale yellow.  Exercise on a regular basis, or as told by your doctor.  Go to the restroom when you feel like you need to poop. Do not hold it.  Only take medicine as told by your doctor. Do not take medicines that help you poop (laxatives) without talking to your doctor first. GET HELP RIGHT AWAY IF:   You have bright red blood in your poop (stool).  Your constipation lasts more than 4 days or gets worse.  You have belly (abdominal) or butt (rectal) pain.  You have thin poop (as thin as a pencil).  You lose weight, and it cannot be explained. MAKE SURE YOU:   Understand these instructions.  Will watch your condition.  Will get help right away if you are not doing well or get worse.   This information is not intended to replace advice given to you by your health care provider. Make sure you discuss any questions you have with your health care provider.   Document Released: 10/12/2007 Document Revised: 05/16/2014 Document Reviewed:  02/04/2013 Elsevier Interactive Patient Education 2016 Else[employer] Inc.  High-Fiber Diet Fiber, also called dietary fiber, is a type of carbohydrate found in fruits, vegetables, whole grains, and beans. A high-fiber diet can have many health benefits. Your health care provider may recommend a high-fiber diet to help:  Prevent constipation. Fiber can make your bowel movements more regular.  Lower your cholesterol.  Relieve hemorrhoids, uncomplicated diverticulosis, or irritable bowel syndrome.  Prevent overeating as part of a weight-loss plan.  Prevent heart disease, type 2 diabetes, and certain cancers. WHAT IS MY PLAN? The recommended daily intake of fiber includes:  38 grams for men under age 80.  30 grams for men over age 80.  25 grams for women under age 80.  21 grams for women over age 80. You can get the recommended daily intake of dietary fiber by eating a variety of fruits, vegetables, grains, and beans. Your health care provider may also recommend a fiber supplement if it is not possible to get enough fiber through your diet. WHAT DO I NEED TO KNOW ABOUT A HIGH-FIBER DIET?  Fiber supplements have not been widely studied for their effectiveness, so it is better to get fiber through food sources.  Always check the fiber content on thenutrition facts label of any prepackaged food. Look for foods that contain at least 5 grams of fiber per serving.  Ask your dietitian if you have questions about specific foods that are related to your  condition, especially if those foods are not listed in the following section.  Increase your daily fiber consumption gradually. Increasing your intake of dietary fiber too quickly may cause bloating, cramping, or gas.  Drink plenty of water. Water helps you to digest fiber. WHAT FOODS CAN I EAT? Grains Whole-grain breads. Multigrain cereal. Oats and oatmeal. Brown rice. Barley. Bulgur wheat. Millet. Bran muffins. Popcorn. Rye wafer  crackers. Vegetables Sweet potatoes. Spinach. Kale. Artichokes. Cabbage. Broccoli. Green peas. Carrots. Squash. Fruits Berries. Pears. Apples. Oranges. Avocados. Prunes and raisins. Dried figs. Meats and Other Protein Sources Navy, kidney, pinto, and soy beans. Split peas. Lentils. Nuts and seeds. Dairy Fiber-fortified yogurt. Beverages Fiber-fortified soy milk. Fiber-fortified orange juice. Other Fiber bars. The items listed above may not be a complete list of recommended foods or beverages. Contact your dietitian for more options. WHAT FOODS ARE NOT RECOMMENDED? Grains White bread. Pasta made with refined flour. White rice. Vegetables Fried potatoes. Canned vegetables. Well-cooked vegetables.  Fruits Fruit juice. Cooked, strained fruit. Meats and Other Protein Sources Fatty cuts of meat. Fried Environmental education officer or fried fish. Dairy Milk. Yogurt. Cream cheese. Sour cream. Beverages Soft drinks. Other Cakes and pastries. Butter and oils. The items listed above may not be a complete list of foods and beverages to avoid. Contact your dietitian for more information. WHAT ARE SOME TIPS FOR INCLUDING HIGH-FIBER FOODS IN MY DIET?  Eat a wide variety of high-fiber foods.  Make sure that half of all grains consumed each day are whole grains.  Replace breads and cereals made from refined flour or white flour with whole-grain breads and cereals.  Replace white rice with brown rice, bulgur wheat, or millet.  Start the day with a breakfast that is high in fiber, such as a cereal that contains at least 5 grams of fiber per serving.  Use beans in place of meat in soups, salads, or pasta.  Eat high-fiber snacks, such as berries, raw vegetables, nuts, or popcorn.   This information is not intended to replace advice given to you by your health care provider. Make sure you discuss any questions you have with your health care provider.   Document Released: 04/25/2005 Document Revised: 05/16/2014  Document Reviewed: 10/08/2013 Elsevier Interactive Patient Education [employer]

## 2015-09-11 NOTE — ED Notes (Signed)
Pt presents to the ER from home accompanied by husband with complains of severe abdominal pain since this morning. Pt reports she has a history of constipation and has not have a bowel movement since last Saturday, pt reports she took some laxatives (Miralax) last night and woke up with abdominal cramping reports not BM. Pt talks in complete sentences no respiratory distress noted, pt in noticeable discomfort from abdominal pain taken to exam room

## 2015-09-11 NOTE — ED Provider Notes (Signed)
[facility] Emergency Department Provider Note  ____________________________________________  Time seen: Approximately 7:23 AM  I have reviewed the triage vital signs and the nursing notes.   HISTORY  Chief Complaint Abdominal Pain and Constipation    HPI Loretta Webb is a 82 y.o. female with a history of chronic constipation requiring daily stool softener and laxative presenting for constipation. The patient reports that since Sunday she has not had a normal bowel movement. She is still passing gas. Last night she took 3 stool softeners and to laxatives, and this morning she took MiraLAX without any production of stool. She has associated diffuse abdominal cramping. No nausea or vomiting, fever, chills, urinary symptoms.   Past Medical History  Diagnosis Date  . Hypertension   . Sleep apnea   . DM2 (diabetes mellitus, type 2) (HCC)   . Arthritis   . Fibromyalgia   . Chronic fatigue   . GERD (gastroesophageal reflux disease)   . Lumbar stenosis   . Osteoporosis   . Esophageal stricture     Patient Active Problem List   Diagnosis Date Noted  . Cephalalgia 07/24/2015  . Neuritis or radiculitis due to rupture of lumbar intervertebral disc 01/05/2015  . Degeneration of intervertebral disc of cervical region 04/14/2014  . B12 deficiency 12/23/2013  . Benign essential HTN 12/23/2013  . Type 2 diabetes mellitus (HCC) 12/23/2013  . Obstructive apnea 12/23/2013  . Lumbar canal stenosis 09/18/2013    Past Surgical History  Procedure Laterality Date  . Knee replacements    . Abdominal hysterectomy    . Rotator cuff repair    . Appendectomy      Current Outpatient Rx  Name  Route  Sig  Dispense  Refill  . bumetanide (BUMEX) 1 MG tablet   Oral   Take 1 mg by mouth daily.          . busPIRone (BUSPAR) 15 MG tablet   Oral   Take 15 mg by mouth 2 (two) times daily.         . DULoxetine (CYMBALTA) 60 MG capsule   Oral   Take 60 mg by  mouth daily.          Marland Kitchen gabapentin (NEURONTIN) 300 MG capsule   Oral   Take 300 mg by mouth at bedtime.          Marland Kitchen losartan (COZAAR) 50 MG tablet   Oral   Take 50 mg by mouth daily.          . metFORMIN (GLUCOPHAGE) 1000 MG tablet   Oral   Take 1,000 mg by mouth 2 (two) times daily with a meal.         . nabumetone (RELAFEN) 500 MG tablet   Oral   Take 500 mg by mouth 2 (two) times daily as needed.          . pravastatin (PRAVACHOL) 40 MG tablet   Oral   Take 40 mg by mouth at bedtime.          . propranolol (INDERAL) 20 MG tablet   Oral   Take 20 mg by mouth 2 (two) times daily.          . traZODone (DESYREL) 50 MG tablet   Oral   Take 100 mg by mouth at bedtime.          Marland Kitchen zolpidem (AMBIEN) 10 MG tablet   Oral   Take 10 mg by mouth at bedtime. Take one tablet daily  at bedtime         . lactulose (CHRONULAC) 10 GM/15ML solution   Oral   Take 15 mLs (10 g total) by mouth daily as needed for severe constipation.   30 mL   0     Allergies Review of patient's allergies indicates no known allergies.  Family History  Problem Relation Age of Onset  . Diabetes Father   . Cancer Mother   . Cancer Father   . Cerebral aneurysm Father   . Cancer Sister   . Depression Sister     Social History Social History  Substance Use Topics  . Smoking status: Never Smoker   . Smokeless tobacco: None  . Alcohol Use: No    Review of Systems Constitutional: No fever/chills.No lightheadedness or syncope. Eyes: No visual changes. ENT: No sore throat. No congestion or rhinorrhea. Cardiovascular: Denies chest pain. Denies palpitations. Respiratory: Denies shortness of breath.  No cough. Gastrointestinal: Positive abdominal cramping.  No nausea, no vomiting.  No diarrhea.  Positive constipation. Genitourinary: Negative for dysuria. Musculoskeletal: Negative for back pain. Skin: Negative for rash. Neurological: Negative for headaches. No focal numbness,  tingling or weakness.   10-point ROS otherwise negative.  ____________________________________________   PHYSICAL EXAM:  VITAL SIGNS: ED Triage Vitals  Enc Vitals Group     BP 09/11/15 0557 149/85 mmHg     Pulse Rate 09/11/15 0557 103     Resp 09/11/15 0557 24     Temp 09/11/15 0617 97.7 F (36.5 C)     Temp Source 09/11/15 0617 Oral     SpO2 09/11/15 0557 98 %     Weight 09/11/15 0557 150 lb (68.04 kg)     Height 09/11/15 0557  (1.626 m)     Head Cir --      Peak Flow --      Pain Score 09/11/15 0558 10     Pain Loc --      Pain Edu? --      Excl. in GC? --     Constitutional: Alert and oriented. Uncomfortable but in no acute distress. Answers questions appropriately. Eyes: Conjunctivae are normal.  EOMI. No scleral icterus. Head: Atraumatic. Nose: No congestion/rhinnorhea. Mouth/Throat: Mucous membranes are mildly dry.  Neck: No stridor.  Supple.   Cardiovascular: Normal rate, regular rhythm. No murmurs, rubs or gallops.  Respiratory: Normal respiratory effort.  No accessory muscle use or retractions. Lungs CTAB.  No wheezes, rales or ronchi. Gastrointestinal: Abdomen is soft, mildly distended and diffusely mildly tender to palpation without any focality.  No guarding or rebound.  No peritoneal signs. Musculoskeletal: No LE edema. No ttp in the calves or palpable cords.  Negative Homan's sign. Neurologic:  A&Ox3.  Speech is clear.  Face and smile are symmetric.  EOMI.  Moves all extremities well. Skin:  Skin is warm, dry and intact. No rash noted. Psychiatric: Mood and affect are normal. Speech and behavior are normal.  Normal judgement.  ____________________________________________   LABS (all labs ordered are listed, but only abnormal results are displayed)  Labs Reviewed  CBC - Abnormal; Notable for the following:    RDW 15.1 (*)    All other components within normal limits  COMPREHENSIVE METABOLIC PANEL - Abnormal; Notable for the following:    Glucose,  Bld 213 (*)    BUN 24 (*)    Creatinine, Ser 1.15 (*)    GFR calc non Af Amer 43 (*)    GFR calc Af Amer 50 (*)  All other components within normal limits  LIPASE, BLOOD   ____________________________________________  EKG  ED ECG REPORT I, Rockne Menghini, the attending physician, personally viewed and interpreted this ECG.   Date: 09/11/2015  EKG Time: 602  Rate: 87  Rhythm: normal sinus rhythm  Axis: Normal  Intervals:none  ST&T Change: Nonspecific T-wave inversions in V4 V5 and V6.  These changes are present and an EKG 9/13 and overall the morphology of the EKG is grossly unchanged.  ____________________________________________  RADIOLOGY  Dg Abd 1 View  09/11/2015  CLINICAL DATA:  Constipation. EXAM: ABDOMEN - 1 VIEW COMPARISON:  None. FINDINGS: Large amount of stool is seen in the distal sigmoid colon and rectum consistent with impaction. Otherwise no abnormal bowel dilatation is noted. Phleboliths are noted in the pelvis. IMPRESSION: Large amount of stool seen in distal sigmoid colon and rectum consistent with impaction. Electronically Signed   By: Lupita Raider, M.D.   On: 09/11/2015 07:07    ____________________________________________   PROCEDURES  Procedure(s) performed: None  Critical Care performed: No ____________________________________________   INITIAL IMPRESSION / ASSESSMENT AND PLAN / ED COURSE  Pertinent labs & imaging results that were available during my care of the patient were reviewed by me and considered in my medical decision making (see chart for details).  82 y.o. female whose only abdominal surgery history is hysterectomy, presenting with constipation without nausea or vomiting, fever or focal pain on exam. The most likely etiology of the patient's symptoms is constipation or fecal impaction. At the time of my exam, the patient reports "it's running out," and I assisted her to the bathroom where her diaper did have some liquid  brown stool. She will sit on the toilet see if she is able to produce more stool at this time. If she is able to evacuate stool in her symptoms improve, we will not proceed with any additional treatment. If she continues to have discomfort, my plan will be to give her lactulose and an enema. At this time, it is much less likely that her symptoms are due to obstruction or an intra-abdominal infection, nor is her presentation consistent with an intra-abdominal surgical pathology.  ----------------------------------------- 8:06 AM on 09/11/2015 -----------------------------------------  The patient produced only a small amount of stool and continues to have lower abdominal cramping. We will try an enema and lactulose at this time and reevaluate the patient after treatment.  ----------------------------------------- 11:40 AM on 09/11/2015 -----------------------------------------  After lactulose and the enema, the patient has produced some stool with the nurse. I performed a rectal exam and the patient had external nonthrombosed nonbleeding hemorrhoids with no palpable internal hemorrhoids. She did not have any stool ball or palpable stool in the vault. She had some mild discomfort on the exam but no bleeding. The result was liquid brown stool without evidence of blood.  At this time, the patient is no longer having any abdominal pain or cramping. Overall, she is feeling better. We will plan to discharge her home. She will follow up with her primary care physician Elberta Spaniel understand return precautions as well as follow-up instructions.  The patient also notes that she has mild renal insufficiency today and will need to drink plenty of fluid and follow up with her primary care physician to have this rechecked. I, we do not have any previous creatinine on her.   ____________________________________________  FINAL CLINICAL IMPRESSION(S) / ED DIAGNOSES  Final diagnoses:  Slow transit constipation   Fecal impaction in rectum [facility])  Renal insufficiency  NEW MEDICATIONS STARTED DURING THIS VISIT:  New Prescriptions   LACTULOSE (CHRONULAC) 10 GM/15ML SOLUTION    Take 15 mLs (10 g total) by mouth daily as needed for severe constipation.     Rockne MenghiniAnne-Caroline Ethelda Deangelo, MD 09/11/15 1141

## 2015-09-11 NOTE — ED Notes (Signed)
Pt taken to xray 

## 2015-09-11 NOTE — ED Notes (Signed)
Pt ambulated to bathroom. Pt had small stool output. Pt reports on and off abdominal pain. Reports currently pain free.

## 2015-10-23 ENCOUNTER — Ambulatory Visit: Payer: Medicare Other | Admitting: Sports Medicine

## 2015-10-28 DIAGNOSIS — E782 Mixed hyperlipidemia: Secondary | ICD-10-CM | POA: Insufficient documentation

## 2015-10-29 ENCOUNTER — Ambulatory Visit (INDEPENDENT_AMBULATORY_CARE_PROVIDER_SITE_OTHER): Payer: Medicare Other | Admitting: Pulmonary Disease

## 2015-10-29 ENCOUNTER — Encounter: Payer: Self-pay | Admitting: Pulmonary Disease

## 2015-10-29 VITALS — BP 130/72 | HR 60 | Ht 64.0 in | Wt 152.0 lb

## 2015-10-29 DIAGNOSIS — G4734 Idiopathic sleep related nonobstructive alveolar hypoventilation: Secondary | ICD-10-CM

## 2015-10-29 DIAGNOSIS — G4733 Obstructive sleep apnea (adult) (pediatric): Secondary | ICD-10-CM

## 2015-10-29 DIAGNOSIS — I272 Other secondary pulmonary hypertension: Secondary | ICD-10-CM

## 2015-10-29 NOTE — Patient Instructions (Signed)
Contineu oxygen with sleep as previously prescribed Follow up as needed

## 2015-10-29 NOTE — Progress Notes (Signed)
PULMONARY OFFICE FOLLOW UP NOTE  Date of initial consult: 04/08/15  PROBLEMS: Mild OSA - CPAP intolerant Nocturnal hypoxemia with mild PAH  DATA: CXR 04/28/15: normal TTE 04/13/15: normal LVEF, mild increased in LA size, RVSP estimate 31 mmHg PFTs 05/14/15: Normal spirometry, normal lung volumes, mild decrease in DlCO Overnight oximetry previously performed - desats to 77% on RA  SUBJ: No new complaints. Denies CP, fever, purulent sputum, hemoptysis, LE edema and calf tenderness  OBJ:  Filed Vitals:   10/29/15 0930  BP: 130/72  Pulse: 60  Height: 5\' 4"  (1.626 m)  Weight: 152 lb (68.947 kg)  SpO2: 98%    EXAM:   Gen: NAD HEENT: WNL Neck: NO LAN, no JVD noted Lungs: full BS, no adventitious sounds Cardiovascular: Reg rate, normal rhythm, no M noted Abdomen: Soft, NT +BS Ext: no C/C/E Neuro: CNs intact, motor/sens grossly intact    IMPRESSION:   Mild OSA  Nocturnal hypoxemia with very mild pulmonary hypertension    PLAN:  Continue nocturnal oxygen supplementation Follow up PRN or as deemed necessary by Dr Olin HauserMark Miller  Alyssa Rotondo, MD PCCM service Mobile [phone] Pager [phone]

## 2016-05-04 DIAGNOSIS — G4734 Idiopathic sleep related nonobstructive alveolar hypoventilation: Secondary | ICD-10-CM | POA: Insufficient documentation

## 2016-10-26 ENCOUNTER — Ambulatory Visit: Payer: Medicare Other | Attending: Physical Medicine and Rehabilitation

## 2016-10-26 DIAGNOSIS — R531 Weakness: Secondary | ICD-10-CM | POA: Diagnosis present

## 2016-10-26 DIAGNOSIS — M5416 Radiculopathy, lumbar region: Secondary | ICD-10-CM | POA: Diagnosis present

## 2016-10-26 DIAGNOSIS — R262 Difficulty in walking, not elsewhere classified: Secondary | ICD-10-CM

## 2016-10-26 NOTE — Therapy (Signed)
[facility] PHYSICAL AND SPORTS MEDICINE [address] [place], [address] Phone: [phone]   Fax:  [phone]  Physical Therapy Evaluation  Patient Details  Name: Loretta Webb MRN: [id number] Date of Birth: 25-Feb-1932 Referring Provider: Merri Ray  Encounter Date: 10/26/2016      PT End of Session - 10/26/16 1046    Visit Number 1   Number of Visits 13   Date for PT Re-Evaluation 31-Dec-2016   Authorization Type g codes 1/10   PT Start Time 0935   PT Stop Time 1035   PT Time Calculation (min) 60 min   Activity Tolerance Patient tolerated treatment well   Behavior During Therapy [facility] for tasks assessed/performed      Past Medical History:  Diagnosis Date  . Arthritis   . Chronic fatigue   . DM2 (diabetes mellitus, type 2) (HCC)   . Esophageal stricture   . Fibromyalgia   . GERD (gastroesophageal reflux disease)   . Hypertension   . Lumbar stenosis   . Osteoporosis   . Sleep apnea     Past Surgical History:  Procedure Laterality Date  . ABDOMINAL HYSTERECTOMY    . APPENDECTOMY    . knee replacements    . ROTATOR CUFF REPAIR      There were no vitals filed for this visit.       Subjective Assessment - 10/26/16 1004    Subjective Bilateral thigh pain and difficulty with balance   Pertinent History Pt reports bilateral LE pain primarily in the back of the thighs. Pt reports that this has been occurring for a couple months. She denies any similar problems in the past. Pt reports sudden onset but no known trauma or change in activity. Pt has a history of a herniated disc in her low back but currently pt is not complaining of any back pain. Per medical record pt has a history of spinal stenosis. Lumbar MRI results reviewed and confirm moderate to severe spinal stenosis at L2-L3. Pain also occurs sometimes in her buttocks. She has a history of bilateral hip bursitis which is mild in pain currently. She has had remote  injections for her bursitis with relief. She describes the pain in her thighs as aching and throbbing. Easing factors: ibuprofen, hydrocodone, voltaren gel, sitting down, hasn't tried ice/heat or massage: Aggravating factors: unknown; Denies numbness/tingling. Denies bruising or swelling. Denies chills, fever, night sweats. Pt reports over the last two weeks worsening of bladder control but she is very unclear about this and it is not clear if it is accurate. Pain rarely wakes patient up at night sometimes. She does not note a difference in pain from AM to PM. Reports that the pain comes on suddenly and resolves as soon as she is able to use her voltaren gel and take pain meds. nable to notice change. Occurs approximately once/day. Worst: 9/10, Best: 0/10, Present 0/10; Pt reports she has been having frequent falls as well and has had one in January, May, and June (10/12/16). No injuries reported during falls. Two of the falls occurred when she has looked to the left and lost her balance. ROS negative for red flags with the exception of possible problems with bladder control but again this is nonspecific and poorly communicated.    Diagnostic tests Lumbar MRI: multilevel degenerative changes, of note L2-L3 moderate to severe spinal stenosis   Patient Stated Goals Resolve pain in her legs, "know why I'm falling"   Currently in  Pain? Yes   Pain Score 0-No pain  Best: 0/10, Worst: 9/10   Pain Location Leg   Pain Descriptors / Indicators Aching;Throbbing   Pain Type Chronic pain   Pain Radiating Towards Buttocks, denies pain in lower back    Pain Onset More than a month ago   Pain Frequency Intermittent   Aggravating Factors  see history   Pain Relieving Factors see history            [facility] PT Assessment - 10/26/16 1006      Assessment   Medical Diagnosis Lumbar radiculitis, impairment of balance   Referring Provider Merri RayBenjamin Chasnis   Onset Date/Surgical Date 08/26/16  Approximate   Hand  Dominance Right   Next MD Visit Bethann PunchesMark Miller next week for CPE, Follow-up with Dr. Yves Dillhasnis July 2018   Prior Therapy None     Precautions   Precautions Fall     Restrictions   Weight Bearing Restrictions No     Balance Screen   Has the patient fallen in the past 6 months Yes   How many times? 3   Has the patient had a decrease in activity level because of a fear of falling?  No   Is the patient reluctant to leave their home because of a fear of falling?  No     Home Nurse, mental healthnvironment   Living Environment Private residence   Living Arrangements Alone   Available Help at Discharge Other (Comment)  Not much family support close   Type of Home House   Home Access Stairs to enter   Entrance Stairs-Number of Steps 4   Entrance Stairs-Rails Right   Home Layout One level     Prior Function   Level of Independence Independent     Cognition   Overall Cognitive Status No family/caregiver present to determine baseline cognitive functioning     Observation/Other Assessments   Other Surveys  Other Surveys   Modified [place] 42%     Sensation   Additional Comments Intact to light touch bilateral LE     Posture/Postural Control   Posture Comments Forward head and rounded shoulders. Pt with flattened lumbar spine in sitting with loss of natural lordosis     ROM / Strength   AROM / PROM / Strength Strength;AROM     AROM   Overall AROM Comments Decreased lumbar flexion with "pulling" in back of thighs but no reproduction of pain. Limited lumbar extension AROM but pain-free. No reproduction of pain with lateral lumbar flexion and rotation. Decreased range of motion throughout lumbar spine. Hip PROM IR/ER WNL for age without gross deficits. Decreased HS length to approximately 70 degrees bilaterally with significant pulling in bilateral LEs but no reproduction of pain. Knee to chest is painless. Negative FADIR and FABER. Negative hip scour. CPA to lumbar spine are mildly hypomobile. Upper lumbar  segments (L1-L2) are painful but no reproduction of LE pain. Positive [place] for decreased rectus length but no reproduction of thigh pain only stretching in anterior thigh.      Strength   Overall Strength Comments Bilateral LE strength is grossly WFL and symmetrical. 4-/5 hip flexion, 5/5 knee extension, 4+/5 knee flexion, 4+/5 R ankle DF, 4/5 L ankle DF. Able to perform full heel and toe raise bilaterally. R KJ 1+, L KJ absent, bilateral AJ absent, babinski absent, no foot drop.        Palpation   Palpation comment Pain with palpation to upper lumbar segment paraspinals but no reproduction of  bilateral leg pain. No pain with palpation along hamstrings bilaterally. Sensitive to palpation along quadriceps but no positive reproduction of pain.      Ambulation/Gait   Gait Comments antalgic, decreased stance R LE, R lateral lean, forward flexed               Objective measurements completed on examination: See above findings.     TREATMENT  Ther-ex Supine single knee to chest stretch with towel 30s hold x 2 bilateral; Seated HS stretch 30s hold x 2 bilateral; Pt requiring heavy verbal and cues for proper form/technique Provided written handout with education about how to perform HEP;          PT Education - 10/26/16 1046    Education provided Yes   Education Details HEP and plan of care   Person(s) Educated Patient   Methods Explanation   Comprehension Verbalized understanding             PT Long Term Goals - 10/26/16 1413      PT LONG TERM GOAL #1   Title  Pt will be independent with HEP in order to improve strength and balance in order to decrease fall risk, improve function at home, and decrease bilateral thigh pain.    Time 6   Period Weeks   Status New     PT LONG TERM GOAL #2   Title Pt will decrease mODI scoreby at least 13 points in order demonstrate clinically significant reduction in pain/disability    Baseline 10/26/16: 42%   Time 6   Period Weeks    Status New     PT LONG TERM GOAL #3   Title Pt will report worst bilateral thigh pain as 6/10 in order to demonstrate clinically significant reduction in her pain   Baseline 10/26/16: worst: 9/10   Time 6   Period Weeks   Status New                Plan - 10/26/16 1401    Clinical Impression Statement Pt is a pleasant 83 yo female referred for bilateral lumbar radiculitis and impairment in her balance. Patient is a difficult historian and is frequently unable to answer questions provided by therapist. She reports bilateral posterior thigh pain for the last 2 months with acute onset and no known trauma. Pain improves with voltaren gel and pain medication. She is unable to provide any known aggravating factors. Pt presents with impairments in LE strength as well as hamstring and quad flexibility. Forward flexed posture and decreased natural lumbar lordosis. Per medical record pt has a history of spinal stenosis and MRI from 2013 confirms moderate to severe spinal stenosis at L2-L3. Unable to mechanically reproduce patient's pain during examination. Pt also reports that pain sometimes wakes her up out of sleep. She reports some worsening bladder control over the last 2 weeks but denies saddle paresthesia. However she is a very poor historian and it is very difficult to get clear details. Limited neurological examination is normal. She reports 3 falls over the last 6 months and it is unclear if her legs are buckling or if she is simply losing her balance. Further balance assessment will be performed at her next visit. She will benefit from skilled PT services to address deficits in LE strength, pain, and balance in order to improve function at decrease fall risk. If she does not improve with physical therapy she may benefit from further medical screening given her history of moderate to severe spinal  stenosis.    History and Personal Factors relevant to plan of care: Memory deficits, lives alone  without close family support, poor historian, history of moderate/severe spinal stenosis.    Clinical Presentation Unstable   Clinical Presentation due to: Unable to mechanically reproduce during evaluation, intermittent without known aggravating factors, varies in intensity   Clinical Decision Making Moderate   Rehab Potential Fair   Clinical Impairments Affecting Rehab Potential chronic back issues, age, pain, memory deficits   PT Frequency 2x / week   PT Duration 6 weeks   PT Treatment/Interventions ADLs/Self Care Home Management;Aquatic Therapy;Cryotherapy;Canalith Repostioning;Electrical Stimulation;Iontophoresis 4mg /ml Dexamethasone;Moist Heat;Ultrasound;DME Instruction;Gait training;Stair training;Functional mobility training;Therapeutic activities;Patient/family education;Balance training;Therapeutic exercise;Neuromuscular re-education;Manual techniques;Vestibular   PT Next Visit Plan Assess balance (BERG, DGI, 5TSTS, TUG), review HEP and progress as tolerated. Continue attempting to mechanically reproduce bilateral posterior ghith pain   PT Home Exercise Plan Single knee to chest stretch, seated hamstring stretch   Consulted and Agree with Plan of Care Patient      Patient will benefit from skilled therapeutic intervention in order to improve the following deficits and impairments:  Pain, Decreased balance, Postural dysfunction, Difficulty walking, Abnormal gait, Decreased strength  Visit Diagnosis: Difficulty walking - Plan: PT plan of care cert/re-cert  Lumbar radiculitis - Plan: PT plan of care cert/re-cert  Weakness - Plan: PT plan of care cert/re-cert      G-Codes - 2016-11-22 1416    Functional Assessment Tool Used (Outpatient Only) mODI, patient interview, clinical presentation, NPRS   Functional Limitation Mobility: Walking and moving around   Mobility: Walking and Moving Around Current Status ([id number]) At least 40 percent but less than 60 percent impaired, limited or  restricted   Mobility: Walking and Moving Around Goal Status [phone]) At least 20 percent but less than 40 percent impaired, limited or restricted       Problem List Patient Active Problem List   Diagnosis Date Noted  . Cephalalgia 07/24/2015  . Neuritis or radiculitis due to rupture of lumbar intervertebral disc 01/05/2015  . Degeneration of intervertebral disc of cervical region 04/14/2014  . B12 deficiency 12/23/2013  . Benign essential HTN 12/23/2013  . Type 2 diabetes mellitus (HCC) 12/23/2013  . Obstructive apnea 12/23/2013  . Lumbar canal stenosis 09/18/2013   Lynnea Maizes PT, DPT   Cassey Bacigalupo 11-22-16, 2:22 PM  [place] [facility] PHYSICAL AND SPORTS MEDICINE [address] [place], [address] Phone: [phone]   Fax:  [phone]  Name: LIZETH BENCOSME MRN: [id number] Date of Birth: 05-05-1932

## 2016-10-31 ENCOUNTER — Ambulatory Visit: Payer: Medicare Other | Admitting: Physical Therapy

## 2016-10-31 ENCOUNTER — Encounter: Payer: Self-pay | Admitting: Physical Therapy

## 2016-10-31 DIAGNOSIS — R531 Weakness: Secondary | ICD-10-CM

## 2016-10-31 DIAGNOSIS — M5416 Radiculopathy, lumbar region: Secondary | ICD-10-CM

## 2016-10-31 DIAGNOSIS — R262 Difficulty in walking, not elsewhere classified: Secondary | ICD-10-CM

## 2016-10-31 NOTE — Therapy (Signed)
[facility] [address] [place], [address] Phone: [phone]   Fax:  [phone]  Physical Therapy Treatment  Patient Details  Name: Loretta Webb MRN: [id number] Date of Birth: 18-Nov-1931 Referring Provider: Merri Ray  Encounter Date: 10/31/2016      PT End of Session - 10/31/16 0902    Visit Number 2   Number of Visits 13   Date for PT Re-Evaluation December 30, 2016   Authorization Type g codes July 11, 2022   PT Start Time 0900   PT Stop Time 0946   PT Time Calculation (min) 46 min   Activity Tolerance Patient tolerated treatment well   Behavior During Therapy [facility] for tasks assessed/performed      Past Medical History:  Diagnosis Date  . Arthritis   . Chronic fatigue   . DM2 (diabetes mellitus, type 2) (HCC)   . Esophageal stricture   . Fibromyalgia   . GERD (gastroesophageal reflux disease)   . Hypertension   . Lumbar stenosis   . Osteoporosis   . Sleep apnea     Past Surgical History:  Procedure Laterality Date  . ABDOMINAL HYSTERECTOMY    . APPENDECTOMY    . knee replacements    . ROTATOR CUFF REPAIR      There were no vitals filed for this visit.      Subjective Assessment - 10/31/16 0904    Subjective Pt reports she completed her HEP on Saturday and reports decreased pain in her legs following.  Pt was busy yesterday and did not complete her exercises.  Says she had some pain in her RLE this morning so she used her gel which helped and pt has no pain currently.   Pertinent History Pt reports bilateral LE pain primarily in the back of the thighs. Pt reports that this has been occurring for a couple months. She denies any similar problems in the past. Pt reports sudden onset but no known trauma or change in activity. Pt has a history of a herniated disc in her low back but currently pt is not complaining of any back pain. Per medical record pt has a history of spinal stenosis. Lumbar MRI  results reviewed and confirm moderate to severe spinal stenosis at L2-L3. Pain also occurs sometimes in her buttocks. She has a history of bilateral hip bursitis which is mild in pain currently. She has had remote injections for her bursitis with relief. She describes the pain in her thighs as aching and throbbing. Easing factors: ibuprofen, hydrocodone, voltaren gel, sitting down, hasn't tried ice/heat or massage: Aggravating factors: unknown; Denies numbness/tingling. Denies bruising or swelling. Denies chills, fever, night sweats. Pt reports over the last two weeks worsening of bladder control but she is very unclear about this and it is not clear if it is accurate. Pain rarely wakes patient up at night sometimes. She does not note a difference in pain from AM to PM. Reports that the pain comes on suddenly and resolves as soon as she is able to use her voltaren gel and take pain meds. nable to notice change. Occurs approximately once/day. Worst: 9/10, Best: 0/10, Present 0/10; Pt reports she has been having frequent falls as well and has had one in January, May, and June (10/12/16). No injuries reported during falls. Two of the falls occurred when she has looked to the left and lost her balance. ROS negative for red flags with the exception of possible problems with bladder control but  again this is nonspecific and poorly communicated.    Diagnostic tests Lumbar MRI: multilevel degenerative changes, of note L2-L3 moderate to severe spinal stenosis   Patient Stated Goals Resolve pain in her legs, "know why I'm falling"   Currently in Pain? No/denies   Pain Onset More than a month ago       TREATMENT   Outcome measures completed and results explained to the pt:  Berg Balance Test: 37/56  5xSTS: 12.39 seconds   Supine HS stretch 30s hold x 2 bilateral   Sidelying quad stretch 30 sec hold x2 BLE   Bridging with cues for glute activation x10 with 5 second holds     Pt reports dizziness when  looking L or turning her head to her L. She says that the past several falls she has experience have been when turning to the L. Completed Dix-Hallpike test which was positive for significant dizziness (L>R). No nystagmus appreciated but pt anxious and reports severe dizziness. L Eply's Maneuver performed and pt instructed to not lie down for a few hours after session. To follow up at future sessions to assess pt's response.          [facility] Adult PT Treatment/Exercise - 10/31/16 0909      Balance   Balance Assessed Yes     Standardized Balance Assessment   Standardized Balance Assessment Berg Balance Test     Berg Balance Test   Sit to Stand Able to stand without using hands and stabilize independently   Standing Unsupported Able to stand safely 2 minutes   Sitting with Back Unsupported but Feet Supported on Floor or Stool Able to sit safely and securely 2 minutes   Stand to Sit Sits safely with minimal use of hands   Transfers Able to transfer safely, minor use of hands   Standing Unsupported with Eyes Closed Able to stand 10 seconds with supervision   Standing Ubsupported with Feet Together Able to place feet together independently and stand for 1 minute with supervision   From Standing, Reach Forward with Outstretched Arm Can reach forward >12 cm safely (5")   From Standing Position, Pick up Object from Floor Able to pick up shoe, needs supervision   From Standing Position, Turn to Look Behind Over each Shoulder Looks behind one side only/other side shows less weight shift   Turn 360 Degrees Able to turn 360 degrees safely but slowly   Standing Unsupported, Alternately Place Feet on Step/Stool Needs assistance to keep from falling or unable to try   Standing Unsupported, One Foot in [employer] Loses balance while stepping or standing   Standing on One Leg Unable to try or needs assist to prevent fall   Total Score 37                PT Education - 10/31/16 0901    Education  provided Yes   Education Details Exercise technique; results of outcome measures; reasoning behind performing Eply's Maneuver   Person(s) Educated Patient   Methods Explanation;Demonstration;Verbal cues   Comprehension Returned demonstration;Verbal cues required;Need further instruction;Verbalized understanding             PT Long Term Goals - 10/26/16 1413      PT LONG TERM GOAL #1   Title  Pt will be independent with HEP in order to improve strength and balance in order to decrease fall risk, improve function at home, and decrease bilateral thigh pain.    Time 6   Period Weeks  Status New     PT LONG TERM GOAL #2   Title Pt will decrease mODI scoreby at least 13 points in order demonstrate clinically significant reduction in pain/disability    Baseline 10/26/16: 42%   Time 6   Period Weeks   Status New     PT LONG TERM GOAL #3   Title Pt will report worst bilateral thigh pain as 6/10 in order to demonstrate clinically significant reduction in her pain   Baseline 10/26/16: worst: 9/10   Time 6   Period Weeks   Status New               Plan - 10/31/16 1133    Clinical Impression Statement Pt scored a 37/56 on the Berg Balance Test, indicating she is at an increased risk of falling due to impaired balance. Will plan on initiating balance interventions at next session.  Pt reported to this PT several times that her past several falls have occurred when looking or turning to the L.  Pt with positive Dix Hallpike (L>R) and completed L Eply's Maneuver this session.  Will continue to assess effectiveness of maneuver at future sessions to determine if this may need to be repeated.  Pt will benefit from continued skilled PT interventions for improved strength, balance, and decreased dizzy episodes.     Rehab Potential Fair   Clinical Impairments Affecting Rehab Potential chronic back issues, age, pain, memory deficits   PT Frequency 2x / week   PT Duration 6 weeks   PT  Treatment/Interventions ADLs/Self Care Home Management;Aquatic Therapy;Cryotherapy;Canalith Repostioning;Electrical Stimulation;Iontophoresis 4mg /ml Dexamethasone;Moist Heat;Ultrasound;DME Instruction;Gait training;Stair training;Functional mobility training;Therapeutic activities;Patient/family education;Balance training;Therapeutic exercise;Neuromuscular re-education;Manual techniques;Vestibular   PT Next Visit Plan Initiate balance exercises, assess effectiveness of Eply's Manuever and repeat if appropriate.  Continue attempting to mechanically reproduce bilateral posterior ghith pain   PT Home Exercise Plan Single knee to chest stretch, seated hamstring stretch   Consulted and Agree with Plan of Care Patient      Patient will benefit from skilled therapeutic intervention in order to improve the following deficits and impairments:  Pain, Decreased balance, Postural dysfunction, Difficulty walking, Abnormal gait, Decreased strength  Visit Diagnosis: Difficulty walking  Lumbar radiculitis  Weakness     Problem List Patient Active Problem List   Diagnosis Date Noted  . Cephalalgia 07/24/2015  . Neuritis or radiculitis due to rupture of lumbar intervertebral disc 01/05/2015  . Degeneration of intervertebral disc of cervical region 04/14/2014  . B12 deficiency 12/23/2013  . Benign essential HTN 12/23/2013  . Type 2 diabetes mellitus (HCC) 12/23/2013  . Obstructive apnea 12/23/2013  . Lumbar canal stenosis 09/18/2013    Encarnacion Chu PT, DPT 10/31/2016, 1:11 PM  [place] [facility] SPORTS MEDICINE [address] [place], [address] Phone: [phone]   Fax:  [phone]  Name: Loretta Webb MRN: [id number] Date of Birth: 1932/04/19

## 2016-11-02 ENCOUNTER — Other Ambulatory Visit: Payer: Self-pay | Admitting: Internal Medicine

## 2016-11-02 DIAGNOSIS — R55 Syncope and collapse: Secondary | ICD-10-CM

## 2016-11-03 ENCOUNTER — Ambulatory Visit: Payer: Medicare Other | Admitting: Physical Therapy

## 2016-11-03 DIAGNOSIS — M5416 Radiculopathy, lumbar region: Secondary | ICD-10-CM

## 2016-11-03 DIAGNOSIS — R262 Difficulty in walking, not elsewhere classified: Secondary | ICD-10-CM

## 2016-11-03 DIAGNOSIS — R531 Weakness: Secondary | ICD-10-CM

## 2016-11-03 NOTE — Therapy (Addendum)
[place] [facility] [address] [place], [address] Phone: [phone]   Fax:  [phone]  Physical Therapy Treatment  Patient Details  Name: Loretta Webb MRN: [id number] Date of Birth: 15-Jul-1931 Referring Provider: Merri Ray  Encounter Date: 11/03/2016      PT End of Session - 11/03/16 0948    Visit Number 3   Number of Visits 13   Date for PT Re-Evaluation 01-03-2017   Authorization Type g codes 2022-08-13   PT Start Time 0904   PT Stop Time 0945   PT Time Calculation (min) 41 min   Activity Tolerance Patient tolerated treatment well   Behavior During Therapy [facility] for tasks assessed/performed      Past Medical History:  Diagnosis Date  . Arthritis   . Chronic fatigue   . DM2 (diabetes mellitus, type 2) (HCC)   . Esophageal stricture   . Fibromyalgia   . GERD (gastroesophageal reflux disease)   . Hypertension   . Lumbar stenosis   . Osteoporosis   . Sleep apnea     Past Surgical History:  Procedure Laterality Date  . ABDOMINAL HYSTERECTOMY    . APPENDECTOMY    . knee replacements    . ROTATOR CUFF REPAIR      There were no vitals filed for this visit.      Subjective Assessment - 11/03/16 0909    Subjective Patient reports she felt quite sick after the Epley maneuver last session. She reports she has had no further symptoms. She is due for an ultrasound of her carotid artery next week. She reports she gets thigh pain first thing in the morning but that the stretching has been helpoful.    Pertinent History Pt reports bilateral LE pain primarily in the back of the thighs. Pt reports that this has been occurring for a couple months. She denies any similar problems in the past. Pt reports sudden onset but no known trauma or change in activity. Pt has a history of a herniated disc in her low back but currently pt is not complaining of any back pain. Per medical record pt has a history of spinal  stenosis. Lumbar MRI results reviewed and confirm moderate to severe spinal stenosis at L2-L3. Pain also occurs sometimes in her buttocks. She has a history of bilateral hip bursitis which is mild in pain currently. She has had remote injections for her bursitis with relief. She describes the pain in her thighs as aching and throbbing. Easing factors: ibuprofen, hydrocodone, voltaren gel, sitting down, hasn't tried ice/heat or massage: Aggravating factors: unknown; Denies numbness/tingling. Denies bruising or swelling. Denies chills, fever, night sweats. Pt reports over the last two weeks worsening of bladder control but she is very unclear about this and it is not clear if it is accurate. Pain rarely wakes patient up at night sometimes. She does not note a difference in pain from AM to PM. Reports that the pain comes on suddenly and resolves as soon as she is able to use her voltaren gel and take pain meds. nable to notice change. Occurs approximately once/day. Worst: 9/10, Best: 0/10, Present 0/10; Pt reports she has been having frequent falls as well and has had one in January, May, and June (10/12/16). No injuries reported during falls. Two of the falls occurred when she has looked to the left and lost her balance. ROS negative for red flags with the exception of possible problems with bladder control but  again this is nonspecific and poorly communicated.    Diagnostic tests Lumbar MRI: multilevel degenerative changes, of note L2-L3 moderate to severe spinal stenosis   Patient Stated Goals Resolve pain in her legs, "know why I'm falling"   Currently in Pain? No/denies     HS stretching -- initially uncomfortable for her, though reduced in soreness with repeated bouts and increased ROM throughout bout.   Supine bridging 3 sets x 8 repetitions -- challenging but able to get through most of the ROM.   Sidelying hip abductions 3 sets x 8 repetitions -- challenging, but appropriate, performed bilaterally    BP - 111/95  HS Curls in sitting with green t-band x 10 for 3 sets   Standing hip extensions with yellow t-band - 2sets x 10, with red and yellow 1 sets x 12                              PT Education - 11/03/16 1256    Education provided Yes   Education Details Patient needs to use a cane as she is a very high falls risk. Continue with stretching.    Person(s) Educated Patient   Methods Explanation;Demonstration   Comprehension Verbalized understanding;Returned demonstration             PT Long Term Goals - 10/26/16 1413      PT LONG TERM GOAL #1   Title  Pt will be independent with HEP in order to improve strength and balance in order to decrease fall risk, improve function at home, and decrease bilateral thigh pain.    Time 6   Period Weeks   Status New     PT LONG TERM GOAL #2   Title Pt will decrease mODI scoreby at least 13 points in order demonstrate clinically significant reduction in pain/disability    Baseline 10/26/16: 42%   Time 6   Period Weeks   Status New     PT LONG TERM GOAL #3   Title Pt will report worst bilateral thigh pain as 6/10 in order to demonstrate clinically significant reduction in her pain   Baseline 10/26/16: worst: 9/10   Time 6   Period Weeks   Status New               Plan - 11/03/16 0948    Clinical Impression Statement Patient has multiple stumbles in brief gait bouts observed by therapist and is an imminent falls risk given her Berg score in last session. Educated patient she really needs to use a cane at all times. She has signs/symptoms consistent with hamstring strain like pain, likely a result of weakened hamstrings and hip extensors, thus would benefit from stretching and strengthening program.    Clinical Presentation Unstable   Clinical Decision Making Moderate   Rehab Potential Fair   Clinical Impairments Affecting Rehab Potential chronic back issues, age, pain, memory deficits   PT  Frequency 2x / week   PT Duration 6 weeks   PT Treatment/Interventions ADLs/Self Care Home Management;Aquatic Therapy;Cryotherapy;Canalith Repostioning;Electrical Stimulation;Iontophoresis 4mg /ml Dexamethasone;Moist Heat;Ultrasound;DME Instruction;Gait training;Stair training;Functional mobility training;Therapeutic activities;Patient/family education;Balance training;Therapeutic exercise;Neuromuscular re-education;Manual techniques;Vestibular   PT Next Visit Plan Initiate balance exercises, assess effectiveness of Eply's Manuever and repeat if appropriate.  Continue attempting to mechanically reproduce bilateral posterior ghith pain   PT Home Exercise Plan Single knee to chest stretch, seated hamstring stretch   Consulted and Agree with Plan of Care Patient  Patient will benefit from skilled therapeutic intervention in order to improve the following deficits and impairments:  Pain, Decreased balance, Postural dysfunction, Difficulty walking, Abnormal gait, Decreased strength  Visit Diagnosis: Difficulty walking  Weakness  Lumbar radiculitis     Problem List Patient Active Problem List   Diagnosis Date Noted  . Cephalalgia 07/24/2015  . Neuritis or radiculitis due to rupture of lumbar intervertebral disc 01/05/2015  . Degeneration of intervertebral disc of cervical region 04/14/2014  . B12 deficiency 12/23/2013  . Benign essential HTN 12/23/2013  . Type 2 diabetes mellitus (HCC) 12/23/2013  . Obstructive apnea 12/23/2013  . Lumbar canal stenosis 09/18/2013   Alva Garnet PT, DPT, CSCS    11/03/2016, 12:59 PM  [place] [facility] PHYS[facility] AND SPORTS MEDICINE [address] [place], [address] Phone: [phone]   Fax:  [phone]  Name: Loretta Webb MRN: [id number] Date of Birth: Sep 04, 1931

## 2016-11-03 NOTE — Patient Instructions (Addendum)
HS stretching   Supine bridging 3 sets x 8 repetitions   Sidelying hip abductions 3 sets x 8 repetitions   BP - 111/95  HS Curls in sitting with green t-band x 10 for 3 sets   Standing hip extensions with yellow t-band - 2sets x 10, with red and yellow 1 sets x 12

## 2016-11-08 ENCOUNTER — Ambulatory Visit: Payer: Medicare Other | Attending: Physical Medicine and Rehabilitation | Admitting: Physical Therapy

## 2016-11-08 DIAGNOSIS — M7062 Trochanteric bursitis, left hip: Secondary | ICD-10-CM | POA: Insufficient documentation

## 2016-11-08 DIAGNOSIS — M25551 Pain in right hip: Secondary | ICD-10-CM | POA: Diagnosis present

## 2016-11-08 DIAGNOSIS — R262 Difficulty in walking, not elsewhere classified: Secondary | ICD-10-CM | POA: Diagnosis present

## 2016-11-08 DIAGNOSIS — M5416 Radiculopathy, lumbar region: Secondary | ICD-10-CM | POA: Insufficient documentation

## 2016-11-08 DIAGNOSIS — R531 Weakness: Secondary | ICD-10-CM

## 2016-11-08 NOTE — Therapy (Signed)
[facility] [address] [place], [address] Phone: [phone]   Fax:  [phone]  Physical Therapy Treatment  Patient Details  Name: Loretta Webb MRN: [id number] Date of Birth: 06/27/1931 Referring Provider: Merri Ray  Encounter Date: 11/08/2016      PT End of Session - 11/08/16 0945    Visit Number 4   Number of Visits 13   Date for PT Re-Evaluation December 28, 2016   Authorization Type g codes 07-Sep-2022   PT Start Time 0908   PT Stop Time 0949   PT Time Calculation (min) 41 min   Activity Tolerance Patient tolerated treatment well   Behavior During Therapy [facility] for tasks assessed/performed      Past Medical History:  Diagnosis Date  . Arthritis   . Chronic fatigue   . DM2 (diabetes mellitus, type 2) (HCC)   . Esophageal stricture   . Fibromyalgia   . GERD (gastroesophageal reflux disease)   . Hypertension   . Lumbar stenosis   . Osteoporosis   . Sleep apnea     Past Surgical History:  Procedure Laterality Date  . ABDOMINAL HYSTERECTOMY    . APPENDECTOMY    . knee replacements    . ROTATOR CUFF REPAIR      There were no vitals filed for this visit.      Subjective Assessment - 11/08/16 0915    Subjective Patient reports her thigh pain is much better, she only gets it temporarily in the mornings and that the stretching has been helpful. She has had difficulty sleeping and is weaning off her Ambien and has had weird dreams.    Pertinent History Pt reports bilateral LE pain primarily in the back of the thighs. Pt reports that this has been occurring for a couple months. She denies any similar problems in the past. Pt reports sudden onset but no known trauma or change in activity. Pt has a history of a herniated disc in her low back but currently pt is not complaining of any back pain. Per medical record pt has a history of spinal stenosis. Lumbar MRI results reviewed and confirm moderate to  severe spinal stenosis at L2-L3. Pain also occurs sometimes in her buttocks. She has a history of bilateral hip bursitis which is mild in pain currently. She has had remote injections for her bursitis with relief. She describes the pain in her thighs as aching and throbbing. Easing factors: ibuprofen, hydrocodone, voltaren gel, sitting down, hasn't tried ice/heat or massage: Aggravating factors: unknown; Denies numbness/tingling. Denies bruising or swelling. Denies chills, fever, night sweats. Pt reports over the last two weeks worsening of bladder control but she is very unclear about this and it is not clear if it is accurate. Pain rarely wakes patient up at night sometimes. She does not note a difference in pain from AM to PM. Reports that the pain comes on suddenly and resolves as soon as she is able to use her voltaren gel and take pain meds. nable to notice change. Occurs approximately once/day. Worst: 9/10, Best: 0/10, Present 0/10; Pt reports she has been having frequent falls as well and has had one in January, May, and June (10/12/16). No injuries reported during falls. Two of the falls occurred when she has looked to the left and lost her balance. ROS negative for red flags with the exception of possible problems with bladder control but again this is nonspecific and poorly communicated.    Diagnostic  tests Lumbar MRI: multilevel degenerative changes, of note L2-L3 moderate to severe spinal stenosis   Patient Stated Goals Resolve pain in her legs, "know why I'm falling"   Currently in Pain? No/denies      TRX sit to stand - x 12 with quick transitions noted, quite easy for her Progressed to sit to stands without use of UEs - initially she required rocking to complete x 6, progressed to x5 with no rocking quite quickly   Side stepping on blue foam pad x 3 laps total with minimal use of UEs - quite challenging on her balance but appropriate, required rest breaks in between each lap   Standing hip  abduction on blue foam pad x 12 for 2 sets with yellow t-band -- quite challenging for her secondary to gluteal strength deficits   Side step ups to BOSU with blue side up x 10 per side for 2 sets with minimal use of UEs to challenge deep hip musculature and balance, difficult for her to complete but appropriate.                         PT Education - 11/08/16 [phone]    Education provided Yes   Education Details Will focus more on balance activities, needs to use cane due to observed falls risk.    Person(s) Educated Patient   Methods Explanation;Demonstration   Comprehension Verbalized understanding;Returned demonstration             PT Long Term Goals - 10/26/16 1413      PT LONG TERM GOAL #1   Title  Pt will be independent with HEP in order to improve strength and balance in order to decrease fall risk, improve function at home, and decrease bilateral thigh pain.    Time 6   Period Weeks   Status New     PT LONG TERM GOAL #2   Title Pt will decrease mODI scoreby at least 13 points in order demonstrate clinically significant reduction in pain/disability    Baseline 10/26/16: 42%   Time 6   Period Weeks   Status New     PT LONG TERM GOAL #3   Title Pt will report worst bilateral thigh pain as 6/10 in order to demonstrate clinically significant reduction in her pain   Baseline 10/26/16: worst: 9/10   Time 6   Period Weeks   Status New               Plan - 11/08/16 1011    Clinical Impression Statement Patient reports her thigh pain symptoms are much improved, would like to focus on balance deficits. She has significant glute-med and hip ER/abductor weakness as well as poor trunk control in gait demonstrated by Trendelenburg pattern and crossing over intermittently in gait. Provided introductory balance/strengthening program today to target her deficits, her quadricep strength is quite good demonstrated by quick 5x sit to stand without use of her UEs.     Clinical Presentation Stable   Clinical Decision Making Moderate   Rehab Potential Fair   Clinical Impairments Affecting Rehab Potential chronic back issues, age, pain, memory deficits   PT Frequency 2x / week   PT Duration 6 weeks   PT Treatment/Interventions ADLs/Self Care Home Management;Aquatic Therapy;Cryotherapy;Canalith Repostioning;Electrical Stimulation;Iontophoresis 4mg /ml Dexamethasone;Moist Heat;Ultrasound;DME Instruction;Gait training;Stair training;Functional mobility training;Therapeutic activities;Patient/family education;Balance training;Therapeutic exercise;Neuromuscular re-education;Manual techniques;Vestibular   PT Next Visit Plan Initiate balance exercises, assess effectiveness of Eply's Manuever and repeat if appropriate.  Continue  attempting to mechanically reproduce bilateral posterior ghith pain   PT Home Exercise Plan Single knee to chest stretch, seated hamstring stretch   Consulted and Agree with Plan of Care Patient      Patient will benefit from skilled therapeutic intervention in order to improve the following deficits and impairments:  Pain, Decreased balance, Postural dysfunction, Difficulty walking, Abnormal gait, Decreased strength  Visit Diagnosis: Difficulty walking  Weakness     Problem List Patient Active Problem List   Diagnosis Date Noted  . Cephalalgia 07/24/2015  . Neuritis or radiculitis due to rupture of lumbar intervertebral disc 01/05/2015  . Degeneration of intervertebral disc of cervical region 04/14/2014  . B12 deficiency 12/23/2013  . Benign essential HTN 12/23/2013  . Type 2 diabetes mellitus (HCC) 12/23/2013  . Obstructive apnea 12/23/2013  . Lumbar canal stenosis 09/18/2013   Alva GarnetPatrick Jeneen Doutt PT, DPT, CSCS    11/08/2016, 10:13 AM  [facility] [address] [place], [place], [address] Phone: [phone]   Fax:  [phone]  Name: Loretta Webb MRN:  [id number] Date of Birth: 1931/06/07

## 2016-11-08 NOTE — Patient Instructions (Signed)
Side stepping on blue foam pad   Standing hip abduction on blue foam pad   Side step ups to BOSU with blue side up

## 2016-11-10 ENCOUNTER — Ambulatory Visit: Payer: Medicare Other | Admitting: Physical Therapy

## 2016-11-10 DIAGNOSIS — R262 Difficulty in walking, not elsewhere classified: Secondary | ICD-10-CM

## 2016-11-10 DIAGNOSIS — R531 Weakness: Secondary | ICD-10-CM

## 2016-11-10 NOTE — Therapy (Signed)
[facility] [address], [place], [address] Phone: [phone]   Fax:  [phone]  Physical Therapy Treatment  Patient Details  Name: Loretta Webb MRN: [id number] Date of Birth: 1932-04-12 Referring Provider: Merri Webb  Encounter Date: 11/10/2016      PT End of Session - 11/10/16 1131    Visit Number 5   Number of Visits 13   Date for PT Re-Evaluation 12/15/2016   Authorization Type g codes 25-Aug-2022   PT Start Time 0904   PT Stop Time 0943   PT Time Calculation (min) 39 min   Activity Tolerance Patient tolerated treatment well   Behavior During Therapy [facility] for tasks assessed/performed      Past Medical History:  Diagnosis Date  . Arthritis   . Chronic fatigue   . DM2 (diabetes mellitus, type 2) (HCC)   . Esophageal stricture   . Fibromyalgia   . GERD (gastroesophageal reflux disease)   . Hypertension   . Lumbar stenosis   . Osteoporosis   . Sleep apnea     Past Surgical History:  Procedure Laterality Date  . ABDOMINAL HYSTERECTOMY    . APPENDECTOMY    . knee replacements    . ROTATOR CUFF REPAIR      There were no vitals filed for this visit.      Subjective Assessment - 11/10/16 0913    Subjective Patient reports she woke up a little sore in her thighs but this has dissipated. She reports no new falls, believes her LEs are getting stronger.    Pertinent History Pt reports bilateral LE pain primarily in the back of the thighs. Pt reports that this has been occurring for a couple months. She denies any similar problems in the past. Pt reports sudden onset but no known trauma or change in activity. Pt has a history of a herniated disc in her low back but currently pt is not complaining of any back pain. Per medical record pt has a history of spinal stenosis. Lumbar MRI results reviewed and confirm moderate to severe spinal stenosis at L2-L3. Pain also occurs sometimes in her buttocks.  She has a history of bilateral hip bursitis which is mild in pain currently. She has had remote injections for her bursitis with relief. She describes the pain in her thighs as aching and throbbing. Easing factors: ibuprofen, hydrocodone, voltaren gel, sitting down, hasn't tried ice/heat or massage: Aggravating factors: unknown; Denies numbness/tingling. Denies bruising or swelling. Denies chills, fever, night sweats. Pt reports over the last two weeks worsening of bladder control but she is very unclear about this and it is not clear if it is accurate. Pain rarely wakes patient up at night sometimes. She does not note a difference in pain from AM to PM. Reports that the pain comes on suddenly and resolves as soon as she is able to use her voltaren gel and take pain meds. nable to notice change. Occurs approximately once/day. Worst: 9/10, Best: 0/10, Present 0/10; Pt reports she has been having frequent falls as well and has had one in January, May, and June (10/12/16). No injuries reported during falls. Two of the falls occurred when she has looked to the left and lost her balance. ROS negative for red flags with the exception of possible problems with bladder control but again this is nonspecific and poorly communicated.    Diagnostic tests Lumbar MRI: multilevel degenerative changes, of note L2-L3 moderate to severe spinal  stenosis   Patient Stated Goals Resolve pain in her legs, "know why I'm falling"   Currently in Pain? No/denies       Sit to stands x 8 with no weight in hands (easy), with 1# DB x 8 (still farily easy) x 6 x 2 sets with 4# (fatigued at this point)  SLS on rocker board A-P orientation x 5 bouts x 8-15" holds with minimal use of UEs  Lateral walking on foam pad x 2 laps for 2 rounds with no HHA (much improved balance and lack of trunk lean as noted from last session)   Agility ladder forward, retro, lateral upon cuing to stimulate reactions x 30" x 3 -- several instances where she  began to lose balance and held on to therapists hand throughout this session.   Standing hip abduction with 10# x 6 repetitions x 2 sets, x5 for 1 set.                           PT Education - 11/10/16 1130    Education provided Yes   Education Details PT can contact MD about cane referral, use of cane in community.    Person(s) Educated Patient   Methods Explanation;Demonstration   Comprehension Verbalized understanding;Returned demonstration             PT Long Term Goals - 10/26/16 1413      PT LONG TERM GOAL #1   Title  Pt will be independent with HEP in order to improve strength and balance in order to decrease fall risk, improve function at home, and decrease bilateral thigh pain.    Time 6   Period Weeks   Status New     PT LONG TERM GOAL #2   Title Pt will decrease mODI scoreby at least 13 points in order demonstrate clinically significant reduction in pain/disability    Baseline 10/26/16: 42%   Time 6   Period Weeks   Status New     PT LONG TERM GOAL #3   Title Pt will report worst bilateral thigh pain as 6/10 in order to demonstrate clinically significant reduction in her pain   Baseline 10/26/16: worst: 9/10   Time 6   Period Weeks   Status New               Plan - 11/10/16 1131    Clinical Impression Statement Patient is demonstrating improving LE strength and reaction speed with dynamic balance activities. She is quite deficit in hip abductor strength which is severely limiting her medial-lateral control. She subjectively reports improved confidence in her balance.She will benefit from additional strengthening and activities focusing on trunk and LE control dynamically to reduce falls risk.    Clinical Presentation Stable   Clinical Decision Making Moderate   Rehab Potential Fair   Clinical Impairments Affecting Rehab Potential chronic back issues, age, pain, memory deficits   PT Frequency 2x / week   PT Duration 6 weeks   PT  Treatment/Interventions ADLs/Self Care Home Management;Aquatic Therapy;Cryotherapy;Canalith Repostioning;Electrical Stimulation;Iontophoresis 4mg /ml Dexamethasone;Moist Heat;Ultrasound;DME Instruction;Gait training;Stair training;Functional mobility training;Therapeutic activities;Patient/family education;Balance training;Therapeutic exercise;Neuromuscular re-education;Manual techniques;Vestibular   PT Next Visit Plan Initiate balance exercises, assess effectiveness of Eply's Manuever and repeat if appropriate.  Continue attempting to mechanically reproduce bilateral posterior ghith pain   PT Home Exercise Plan Single knee to chest stretch, seated hamstring stretch   Consulted and Agree with Plan of Care Patient      Patient  will benefit from skilled therapeutic intervention in order to improve the following deficits and impairments:  Pain, Decreased balance, Postural dysfunction, Difficulty walking, Abnormal gait, Decreased strength  Visit Diagnosis: Weakness  Difficulty walking     Problem List Patient Active Problem List   Diagnosis Date Noted  . Cephalalgia 07/24/2015  . Neuritis or radiculitis due to rupture of lumbar intervertebral disc 01/05/2015  . Degeneration of intervertebral disc of cervical region 04/14/2014  . B12 deficiency 12/23/2013  . Benign essential HTN 12/23/2013  . Type 2 diabetes mellitus (HCC) 12/23/2013  . Obstructive apnea 12/23/2013  . Lumbar canal stenosis 09/18/2013   Alva GarnetPatrick Zander Ingham PT, DPT, CSCS   11/10/2016, 11:41 AM  [facility] [address] [place], [place], [address] Phone: [phone]   Fax:  [phone]  Name: Loretta Webb MRN: [id number] Date of Birth: 1931/10/13

## 2016-11-10 NOTE — Patient Instructions (Addendum)
Sit to stands   SLS on rocker board A-P orientation   Lateral walking on foam pad x 2 laps for 2 rounds with no HHA   Agility ladder forward, retro, lateral upon cuing to stimulate reactions x 30" x 3   Standing hip abduction with 10# x 6 repetitions x 2 sets, x5 for 1 set.

## 2016-11-11 ENCOUNTER — Ambulatory Visit
Admission: RE | Admit: 2016-11-11 | Discharge: 2016-11-11 | Disposition: A | Discharge status: Home / Self Care | Payer: Medicare Other | Source: Ambulatory Visit | Attending: Internal Medicine | Admitting: Internal Medicine

## 2016-11-11 DIAGNOSIS — R55 Syncope and collapse: Secondary | ICD-10-CM | POA: Diagnosis present

## 2016-11-11 DIAGNOSIS — I6523 Occlusion and stenosis of bilateral carotid arteries: Secondary | ICD-10-CM | POA: Diagnosis not present

## 2016-11-15 ENCOUNTER — Ambulatory Visit: Payer: Medicare Other | Admitting: Physical Therapy

## 2016-11-15 DIAGNOSIS — R531 Weakness: Secondary | ICD-10-CM

## 2016-11-15 DIAGNOSIS — R262 Difficulty in walking, not elsewhere classified: Secondary | ICD-10-CM | POA: Diagnosis not present

## 2016-11-15 NOTE — Therapy (Signed)
[facility] [address], [place], [address] Phone: [phone]   Fax:  [phone]  Physical Therapy Treatment  Patient Details  Name: Loretta Webb MRN: [id number] Date of Birth: 06-09-1931 Referring Provider: Merri Ray  Encounter Date: 11/15/2016      PT End of Session - 11/15/16 0944    Visit Number 6   Number of Visits 13   Date for PT Re-Evaluation 12-09-16   Authorization Type g codes 19-Aug-2022   PT Start Time 0905   PT Stop Time 0944   PT Time Calculation (min) 39 min   Activity Tolerance Patient tolerated treatment well   Behavior During Therapy [facility] for tasks assessed/performed      Past Medical History:  Diagnosis Date  . Arthritis   . Chronic fatigue   . DM2 (diabetes mellitus, type 2) (HCC)   . Esophageal stricture   . Fibromyalgia   . GERD (gastroesophageal reflux disease)   . Hypertension   . Lumbar stenosis   . Osteoporosis   . Sleep apnea     Past Surgical History:  Procedure Laterality Date  . ABDOMINAL HYSTERECTOMY    . APPENDECTOMY    . knee replacements    . ROTATOR CUFF REPAIR      There were no vitals filed for this visit.      Subjective Assessment - 11/15/16 0910    Subjective Patient reports she will be seeing her MD for her allergies today. Reports she has not been feeling as good as she is being weaned off of her sleeping medications.    Pertinent History Pt reports bilateral LE pain primarily in the back of the thighs. Pt reports that this has been occurring for a couple months. She denies any similar problems in the past. Pt reports sudden onset but no known trauma or change in activity. Pt has a history of a herniated disc in her low back but currently pt is not complaining of any back pain. Per medical record pt has a history of spinal stenosis. Lumbar MRI results reviewed and confirm moderate to severe spinal stenosis at L2-L3. Pain also occurs sometimes  in her buttocks. She has a history of bilateral hip bursitis which is mild in pain currently. She has had remote injections for her bursitis with relief. She describes the pain in her thighs as aching and throbbing. Easing factors: ibuprofen, hydrocodone, voltaren gel, sitting down, hasn't tried ice/heat or massage: Aggravating factors: unknown; Denies numbness/tingling. Denies bruising or swelling. Denies chills, fever, night sweats. Pt reports over the last two weeks worsening of bladder control but she is very unclear about this and it is not clear if it is accurate. Pain rarely wakes patient up at night sometimes. She does not note a difference in pain from AM to PM. Reports that the pain comes on suddenly and resolves as soon as she is able to use her voltaren gel and take pain meds. nable to notice change. Occurs approximately once/day. Worst: 9/10, Best: 0/10, Present 0/10; Pt reports she has been having frequent falls as well and has had one in January, May, and June (10/12/16). No injuries reported during falls. Two of the falls occurred when she has looked to the left and lost her balance. ROS negative for red flags with the exception of possible problems with bladder control but again this is nonspecific and poorly communicated.    Diagnostic tests Lumbar MRI: multilevel degenerative changes, of note L2-L3  moderate to severe spinal stenosis   Patient Stated Goals Resolve pain in her legs, "know why I'm falling"   Currently in Pain? No/denies      BP - 133/106 HR 63   Gait training with quad cane - switched orientation of prongs -- cuing to have cane and contralateral LE moving simultaneously for optimal balance and weightbearing assistance (QC in LUE)   TRX eccentric single leg sit to stands 2 sets x 5 repetitions (very challenging)   Blue foam pad lateral stepping with yellow t-band x 2 laps (quite challenging for her) x 2 sets (minimal use of UEs for balance support)   Standing hip  abductions with yellow t-band around ankles x 8 (stepping touches with HHA) x 10 on second set -- quite challenging but much improved technique   Side stepping/forward/retro on agility ladder with PT providing HHA -- able to quickly respond to verbal cuing for directional changes, no losses of balance though 1 instance where she had some difficulty with maintaining erect posture on LLE noted.                            PT Education - 11/15/16 0944    Education provided Yes   Education Details PT will contact MD about cane, length of PT bout   Person(s) Educated Patient   Methods Explanation;Demonstration   Comprehension Verbalized understanding;Returned demonstration             PT Long Term Goals - 10/26/16 1413      PT LONG TERM GOAL #1   Title  Pt will be independent with HEP in order to improve strength and balance in order to decrease fall risk, improve function at home, and decrease bilateral thigh pain.    Time 6   Period Weeks   Status New     PT LONG TERM GOAL #2   Title Pt will decrease mODI scoreby at least 13 points in order demonstrate clinically significant reduction in pain/disability    Baseline 10/26/16: 42%   Time 6   Period Weeks   Status New     PT LONG TERM GOAL #3   Title Pt will report worst bilateral thigh pain as 6/10 in order to demonstrate clinically significant reduction in her pain   Baseline 10/26/16: worst: 9/10   Time 6   Period Weeks   Status New               Plan - 11/15/16 0944    Clinical Impression Statement Patient is progressing nicely with hip abductor strengthening and reactive balance drills. She still has significant limitations in both, but relative to her initial baseline there is significant improvement noted. She reports little to no pain in her legs since beginning PT.    Clinical Presentation Stable   Clinical Decision Making Moderate   Rehab Potential Fair   Clinical Impairments Affecting  Rehab Potential chronic back issues, age, pain, memory deficits   PT Frequency 2x / week   PT Duration 6 weeks   PT Treatment/Interventions ADLs/Self Care Home Management;Aquatic Therapy;Cryotherapy;Canalith Repostioning;Electrical Stimulation;Iontophoresis 4mg /ml Dexamethasone;Moist Heat;Ultrasound;DME Instruction;Gait training;Stair training;Functional mobility training;Therapeutic activities;Patient/family education;Balance training;Therapeutic exercise;Neuromuscular re-education;Manual techniques;Vestibular   PT Next Visit Plan Initiate balance exercises, assess effectiveness of Eply's Manuever and repeat if appropriate.  Continue attempting to mechanically reproduce bilateral posterior ghith pain   PT Home Exercise Plan Single knee to chest stretch, seated hamstring stretch   Consulted and Agree with  Plan of Care Patient      Patient will benefit from skilled therapeutic intervention in order to improve the following deficits and impairments:  Pain, Decreased balance, Postural dysfunction, Difficulty walking, Abnormal gait, Decreased strength  Visit Diagnosis: Weakness  Difficulty walking     Problem List Patient Active Problem List   Diagnosis Date Noted  . Cephalalgia 07/24/2015  . Neuritis or radiculitis due to rupture of lumbar intervertebral disc 01/05/2015  . Degeneration of intervertebral disc of cervical region 04/14/2014  . B12 deficiency 12/23/2013  . Benign essential HTN 12/23/2013  . Type 2 diabetes mellitus (HCC) 12/23/2013  . Obstructive apnea 12/23/2013  . Lumbar canal stenosis 09/18/2013   Alva Garnet PT, DPT, CSCS    11/15/2016, 9:53 AM  [facility] PHYSICAL AND SPORTS MEDICINE [address] [place], [address] Phone: [phone]   Fax:  [phone]  Name: BRANDOLYN SHORTRIDGE MRN: [id number] Date of Birth: 1931/07/23

## 2016-11-15 NOTE — Patient Instructions (Addendum)
BP - 133/106 HR 63   Gait training with quad cane - switched orientation of prongs   TRX eccentric single leg sit to stands 2 sets x 5 repetitions (very challenging)   Blue foam pad lateral stepping with yellow t-band x 2 laps (quite challenging for her) x 2 sets (minimal use of UEs for balance support)   Standing hip abductions with yellow t-band around ankles x 8 (stepping touches with HHA) x 10 on second set -- quite challenging but much improved technique   Side stepping/forward/retro on agility ladder with PT providing HHA

## 2016-11-17 ENCOUNTER — Ambulatory Visit: Payer: Medicare Other | Admitting: Physical Therapy

## 2016-11-17 DIAGNOSIS — R262 Difficulty in walking, not elsewhere classified: Secondary | ICD-10-CM | POA: Diagnosis not present

## 2016-11-17 DIAGNOSIS — R531 Weakness: Secondary | ICD-10-CM

## 2016-11-17 NOTE — Therapy (Signed)
[facility] PHYSICAL AND SPORTS MEDICINE [address] [place], [place], [address] Phone: [phone]   Fax:  [phone]  Physical Therapy Treatment  Patient Details  Name: Loretta Webb MRN: [id number] Date of Birth: 02/28/1932 Referring Provider: Merri RayBenjamin Chasnis  Encounter Date: 11/17/2016      PT End of Session - 11/17/16 0947    Visit Number 7   Number of Visits 13   Date for PT Re-Evaluation 12/07/16   Authorization Type g codes 4/10   PT Start Time 0904   PT Stop Time 0945   PT Time Calculation (min) 41 min   Activity Tolerance Patient tolerated treatment well   [facility]      Past Medical History:  Diagnosis Date  . Arthritis   . Chronic fatigue   . DM2 (diabetes mellitus, type 2) (HCC)   . Esophageal stricture   . Fibromyalgia   . GERD (gastroesophageal reflux disease)   . Hypertension   . Lumbar stenosis   . Osteoporosis   . Sleep apnea     Past Surgical History:  Procedure Laterality Date  . ABDOMINAL HYSTERECTOMY    . APPENDECTOMY    . knee replacements    . ROTATOR CUFF REPAIR      There were no vitals filed for this visit.      Subjective Assessment - 11/17/16 0915    Subjective Patient reports she was feeling fairly sore (in her muscles) yesterday after PT session on Tuesday, this has resolved today. She is feeling more confident in her balance ambulating.    Pertinent History Pt reports bilateral LE pain primarily in the back of the thighs. Pt reports that this has been occurring for a couple months. She denies any similar problems in the past. Pt reports sudden onset but no known trauma or change in activity. Pt has a history of a herniated disc in her low back but currently pt is not complaining of any back pain. Per medical record pt has a history of spinal stenosis. Lumbar MRI results reviewed and confirm moderate to severe spinal stenosis at L2-L3. Pain also  occurs sometimes in her buttocks. She has a history of bilateral hip bursitis which is mild in pain currently. She has had remote injections for her bursitis with relief. She describes the pain in her thighs as aching and throbbing. Easing factors: ibuprofen, hydrocodone, voltaren gel, sitting down, hasn't tried ice/heat or massage: Aggravating factors: unknown; Denies numbness/tingling. Denies bruising or swelling. Denies chills, fever, night sweats. Pt reports over the last two weeks worsening of bladder control but she is very unclear about this and it is not clear if it is accurate. Pain rarely wakes patient up at night sometimes. She does not note a difference in pain from AM to PM. Reports that the pain comes on suddenly and resolves as soon as she is able to use her voltaren gel and take pain meds. nable to notice change. Occurs approximately once/day. Worst: 9/10, Best: 0/10, Present 0/10; Pt reports she has been having frequent falls as well and has had one in January, May, and June (10/12/16). No injuries reported during falls. Two of the falls occurred when she has looked to the left and lost her balance. ROS negative for red flags with the exception of possible problems with bladder control but again this is nonspecific and poorly communicated.    Diagnostic tests Lumbar MRI: multilevel degenerative changes, of note L2-L3 moderate  to severe spinal stenosis   Patient Stated Goals Resolve pain in her legs, "know why I'm falling"   Currently in Pain? No/denies      Leg Press (brought down to 15# as heavier weights were too heavy) x 20 repetitions, progressed to 20# x 12 repetitions, 25# x 10 repetitions   Standing hip abductions on MATRIX x 10# x 12 repetitions for 2 sets bilaterally   Ladder drills (forwards 2 per set) -- laterally and then in response to COD commands from therapist - patient demonstrated significant increase in condifence and speed of change of direction and movement performed.  No loss of balance with minimal balance assistance from therapist.   Step ups to 1 riser x 8 per side with no HHA x 2 sets (fatigued at this point)                            PT Education - 11/17/16 1251    Education provided Yes   Education Details Soreness is normal as the body is adapting to a new stimulus, will continue to monitor to make as tolerable as possible.    Person(s) Educated Patient   Methods Explanation   Comprehension Verbalized understanding             PT Long Term Goals - 10/26/16 1413      PT LONG TERM GOAL #1   Title  Pt will be independent with HEP in order to improve strength and balance in order to decrease fall risk, improve function at home, and decrease bilateral thigh pain.    Time 6   Period Weeks   Status New     PT LONG TERM GOAL #2   Title Pt will decrease mODI scoreby at least 13 points in order demonstrate clinically significant reduction in pain/disability    Baseline 10/26/16: 42%   Time 6   Period Weeks   Status New     PT LONG TERM GOAL #3   Title Pt will report worst bilateral thigh pain as 6/10 in order to demonstrate clinically significant reduction in her pain   Baseline 10/26/16: worst: 9/10   Time 6   Period Weeks   Status New               Plan - 11/17/16 0947    Clinical Impression Statement Patient demonstrates improving gait speed, change of direction confidence, and decreased reliance on AD during ambulation all indicative of positive improvement in dynamic balance. Balance does take time to translate from general skill of strength to specific scenarios thus scores on test may take some time to demonstrate strength gains.    Clinical Presentation Stable   Clinical Decision Making Moderate   Rehab Potential Fair   Clinical Impairments Affecting Rehab Potential chronic back issues, age, pain, memory deficits   PT Frequency 2x / week   PT Duration 6 weeks   PT Treatment/Interventions ADLs/Self  Care Home Management;Aquatic Therapy;Cryotherapy;Canalith Repostioning;Electrical Stimulation;Iontophoresis 4mg /ml Dexamethasone;Moist Heat;Ultrasound;DME Instruction;Gait training;Stair training;Functional mobility training;Therapeutic activities;Patient/family education;Balance training;Therapeutic exercise;Neuromuscular re-education;Manual techniques;Vestibular   PT Next Visit Plan Initiate balance exercises, assess effectiveness of Eply's Manuever and repeat if appropriate.  Continue attempting to mechanically reproduce bilateral posterior ghith pain   PT Home Exercise Plan Single knee to chest stretch, seated hamstring stretch   Consulted and Agree with Plan of Care Patient      Patient will benefit from skilled therapeutic intervention in order to improve the following  deficits and impairments:  Pain, Decreased balance, Postural dysfunction, Difficulty walking, Abnormal gait, Decreased strength  Visit Diagnosis: Weakness  Difficulty walking     Problem List Patient Active Problem List   Diagnosis Date Noted  . Cephalalgia 07/24/2015  . Neuritis or radiculitis due to rupture of lumbar intervertebral disc 01/05/2015  . Degeneration of intervertebral disc of cervical region 04/14/2014  . B12 deficiency 12/23/2013  . Benign essential HTN 12/23/2013  . Type 2 diabetes mellitus (HCC) 12/23/2013  . Obstructive apnea 12/23/2013  . Lumbar canal stenosis 09/18/2013    Alva Garnet PT, DPT, CSCS    11/17/2016, 12:52 PM  [facility] PHYSICAL AND SPORTS MEDICINE [address], [place], [address] Phone: [phone]   Fax:  [phone]  Name: Loretta Webb MRN: [id number] Date of Birth: 1932/04/25

## 2016-11-17 NOTE — Patient Instructions (Signed)
Leg Press (brought down to 15# as heavier weights were too heavy) x 20 repetitions, progressed to 20# x 12 repetitions, 25# x 10 repetitions   Standing hip abductions on MATRIX x 10# x 12 repetitions for 2 sets bilaterally   Ladder drills (forwards 2 per set)   Step ups to 1 riser x 8 per side with no HHA x 2 sets

## 2016-11-22 ENCOUNTER — Ambulatory Visit: Payer: Medicare Other | Admitting: Physical Therapy

## 2016-11-24 ENCOUNTER — Ambulatory Visit: Payer: Medicare Other | Admitting: Physical Therapy

## 2016-11-24 DIAGNOSIS — R262 Difficulty in walking, not elsewhere classified: Secondary | ICD-10-CM

## 2016-11-24 DIAGNOSIS — M5416 Radiculopathy, lumbar region: Secondary | ICD-10-CM

## 2016-11-24 DIAGNOSIS — R531 Weakness: Secondary | ICD-10-CM

## 2016-11-24 NOTE — Therapy (Signed)
[facility] [id number] [place], [address] Phone: [phone]   Fax:  [phone]  Physical Therapy Treatment  Patient Details  Name: Loretta Webb MRN: [id number] Date of Birth: [birth year] Referring Provider: Merri Ray  Encounter Date: 11/24/2016      PT End of Session - 11/24/16 0927    Visit Number 8   Number of Visits 13   Date for PT Re-Evaluation 10-Dec-2016   Authorization Type g codes 2022/08/20   PT Start Time 0903   PT Stop Time 0942   PT Time Calculation (min) 39 min   Activity Tolerance Patient tolerated treatment well   Behavior During Therapy [facility] for tasks assessed/performed      Past Medical History:  Diagnosis Date  . Arthritis   . Chronic fatigue   . DM2 (diabetes mellitus, type 2) (HCC)   . Esophageal stricture   . Fibromyalgia   . GERD (gastroesophageal reflux disease)   . Hypertension   . Lumbar stenosis   . Osteoporosis   . Sleep apnea     Past Surgical History:  Procedure Laterality Date  . ABDOMINAL HYSTERECTOMY    . APPENDECTOMY    . knee replacements    . ROTATOR CUFF REPAIR      There were no vitals filed for this visit.      Subjective Assessment - 11/24/16 0910    Subjective Patient reports no new falls, though she is having difficulty with sleep-medication dosing. She reports she has had more posterior thigh and posterior hip pain over the hamstrings region, especially when she wakes up.    Pertinent History Pt reports bilateral LE pain primarily in the back of the thighs. Pt reports that this has been occurring for a couple months. She denies any similar problems in the past. Pt reports sudden onset but no known trauma or change in activity. Pt has a history of a herniated disc in her low back but currently pt is not complaining of any back pain. Per medical record pt has a history of spinal stenosis. Lumbar MRI results reviewed and confirm moderate to severe  spinal stenosis at L2-L3. Pain also occurs sometimes in her buttocks. She has a history of bilateral hip bursitis which is mild in pain currently. She has had remote injections for her bursitis with relief. She describes the pain in her thighs as aching and throbbing. Easing factors: ibuprofen, hydrocodone, voltaren gel, sitting down, hasn't tried ice/heat or massage: Aggravating factors: unknown; Denies numbness/tingling. Denies bruising or swelling. Denies chills, fever, night sweats. Pt reports over the last two weeks worsening of bladder control but she is very unclear about this and it is not clear if it is accurate. Pain rarely wakes patient up at night sometimes. She does not note a difference in pain from AM to PM. Reports that the pain comes on suddenly and resolves as soon as she is able to use her voltaren gel and take pain meds. nable to notice change. Occurs approximately once/day. Worst: 9/10, Best: 0/10, Present 0/10; Pt reports she has been having frequent falls as well and has had one in January, May, and June (10/12/16). No injuries reported during falls. Two of the falls occurred when she has looked to the left and lost her balance. ROS negative for red flags with the exception of possible problems with bladder control but again this is nonspecific and poorly communicated.    Diagnostic tests Lumbar MRI: multilevel degenerative  changes, of note L2-L3 moderate to severe spinal stenosis   Patient Stated Goals Resolve pain in her legs, "know why I'm falling"   Currently in Pain? Yes   Pain Score --  Does not rate but indicates mild to moderate discomfort in her HS region bilaterally      Supine bridging x 6 for 4 sets (appropriate technique noted)   PT assisted with HS stretching x 5 sets bilaterally x 15-30"   Standing hip abduction x 12 repetitions at 10# bilaterally for 2 sets on OMEGA   Educated and observed patient 90-90 HS stretch x 4 sets for 2 bouts bilaterally x 15" holds   BP  136/67 HR - 70  Gait observation -- noted to have minimal hip extension, Trendelenburg, and occasional scissoring gait pattern.                            PT Education - 11/24/16 [phone]    Education provided Yes   Education Details Educated on use of cane, need to begin HS stretching daily in the morning   Person(s) Educated Patient   Methods Explanation;Demonstration;Handout   Comprehension Need further instruction;Returned demonstration;Verbalized understanding             PT Long Term Goals - 10/26/16 1413      PT LONG TERM GOAL #1   Title  Pt will be independent with HEP in order to improve strength and balance in order to decrease fall risk, improve function at home, and decrease bilateral thigh pain.    Time 6   Period Weeks   Status New     PT LONG TERM GOAL #2   Title Pt will decrease mODI scoreby at least 13 points in order demonstrate clinically significant reduction in pain/disability    Baseline 10/26/16: 42%   Time 6   Period Weeks   Status New     PT LONG TERM GOAL #3   Title Pt will report worst bilateral thigh pain as 6/10 in order to demonstrate clinically significant reduction in her pain   Baseline 10/26/16: worst: 9/10   Time 6   Period Weeks   Status New               Plan - 11/24/16 [id number]    Clinical Impression Statement Patient reports increased difficulty with balance this date due to change in sleeping medications. She is also having increase in hamstring pain, which is likely exacerbated by her sedentary lifestyle and severe hip extensor/abductor weakness. She was provided with HEP to begin to address HS stretching and gluteal strengthening.    Clinical Presentation Stable   Clinical Decision Making Moderate   Rehab Potential Fair   Clinical Impairments Affecting Rehab Potential chronic back issues, age, pain, memory deficits   PT Frequency 2x / week   PT Duration 6 weeks   PT Treatment/Interventions ADLs/Self Care  Home Management;Aquatic Therapy;Cryotherapy;Canalith Repostioning;Electrical Stimulation;Iontophoresis 4mg /ml Dexamethasone;Moist Heat;Ultrasound;DME Instruction;Gait training;Stair training;Functional mobility training;Therapeutic activities;Patient/family education;Balance training;Therapeutic exercise;Neuromuscular re-education;Manual techniques;Vestibular   PT Next Visit Plan Initiate balance exercises, assess effectiveness of Eply's Manuever and repeat if appropriate.  Continue attempting to mechanically reproduce bilateral posterior ghith pain   PT Home Exercise Plan Single knee to chest stretch, seated hamstring stretch   Consulted and Agree with Plan of Care Patient      Patient will benefit from skilled therapeutic intervention in order to improve the following deficits and impairments:  Pain, Decreased balance, Postural  dysfunction, Difficulty walking, Abnormal gait, Decreased strength  Visit Diagnosis: Weakness  Difficulty walking  Lumbar radiculitis       G-Codes - 12/04/2016 0943    Functional Assessment Tool Used (Outpatient Only) Patient interview, gait observation    Functional Limitation Mobility: Walking and moving around   Mobility: Walking and Moving Around Current Status ([id number]) At least 40 percent but less than 60 percent impaired, limited or restricted   Mobility: Walking and Moving Around Goal Status [phone]) At least 20 percent but less than 40 percent impaired, limited or restricted      Problem List Patient Active Problem List   Diagnosis Date Noted  . Cephalalgia 07/24/2015  . Neuritis or radiculitis due to rupture of lumbar intervertebral disc 01/05/2015  . Degeneration of intervertebral disc of cervical region 04/14/2014  . B12 deficiency 12/23/2013  . Benign essential HTN 12/23/2013  . Type 2 diabetes mellitus (HCC) 12/23/2013  . Obstructive apnea 12/23/2013  . Lumbar canal stenosis 09/18/2013   Alva Garnet PT, DPT, CSCS    12/04/2016, 12:53  PM  [facility] PHYSICAL AND SPORTS MEDICINE [address], [place], [address] Phone: [phone]   Fax:  [phone]  Name: DENAISHA SWANGO MRN: [id number] Date of Birth: Mar 25, 1932

## 2016-11-24 NOTE — Patient Instructions (Addendum)
Supine bridging x 6 for 4 sets   PT assisted with HS stretching   Standing hip abduction x 12 repetitions at 10# bilaterally   Educated and observed patient 90-90 HS stretch  BP 136/67 HR - 70

## 2016-11-25 ENCOUNTER — Emergency Department
Admission: EM | Admit: 2016-11-25 | Discharge: 2016-11-25 | Disposition: A | Discharge status: Home / Self Care | Payer: Medicare Other | Attending: Emergency Medicine | Admitting: Emergency Medicine

## 2016-11-25 ENCOUNTER — Encounter: Payer: Self-pay | Admitting: Emergency Medicine

## 2016-11-25 ENCOUNTER — Emergency Department: Payer: Medicare Other

## 2016-11-25 DIAGNOSIS — E119 Type 2 diabetes mellitus without complications: Secondary | ICD-10-CM | POA: Diagnosis not present

## 2016-11-25 DIAGNOSIS — R079 Chest pain, unspecified: Secondary | ICD-10-CM

## 2016-11-25 DIAGNOSIS — I10 Essential (primary) hypertension: Secondary | ICD-10-CM | POA: Diagnosis not present

## 2016-11-25 DIAGNOSIS — Z79899 Other long term (current) drug therapy: Secondary | ICD-10-CM | POA: Diagnosis not present

## 2016-11-25 DIAGNOSIS — Z7984 Long term (current) use of oral hypoglycemic drugs: Secondary | ICD-10-CM | POA: Insufficient documentation

## 2016-11-25 LAB — CBC
HEMATOCRIT: 39.4 % (ref 35.0–47.0)
HEMOGLOBIN: 13.4 g/dL (ref 12.0–16.0)
MCH: 30.2 pg (ref 26.0–34.0)
MCHC: 34 g/dL (ref 32.0–36.0)
MCV: 88.8 fL (ref 80.0–100.0)
Platelets: 311 10*3/uL (ref 150–440)
RBC: 4.43 MIL/uL (ref 3.80–5.20)
RDW: 14 % (ref 11.5–14.5)
WBC: 8.9 10*3/uL (ref 3.6–11.0)

## 2016-11-25 LAB — COMPREHENSIVE METABOLIC PANEL
ALBUMIN: 3.9 g/dL (ref 3.5–5.0)
ALT: 10 U/L — AB (ref 14–54)
AST: 21 U/L (ref 15–41)
Alkaline Phosphatase: 61 U/L (ref 38–126)
Anion gap: 9 (ref 5–15)
BILIRUBIN TOTAL: 0.6 mg/dL (ref 0.3–1.2)
BUN: 22 mg/dL — AB (ref 6–20)
CO2: 23 mmol/L (ref 22–32)
CREATININE: 1.2 mg/dL — AB (ref 0.44–1.00)
Calcium: 9.5 mg/dL (ref 8.9–10.3)
Chloride: 107 mmol/L (ref 101–111)
GFR calc Af Amer: 47 mL/min — ABNORMAL LOW (ref >60)
GFR calc non Af Amer: 41 mL/min — ABNORMAL LOW (ref >60)
GLUCOSE: 149 mg/dL — AB (ref 65–99)
POTASSIUM: 4 mmol/L (ref 3.5–5.1)
Sodium: 139 mmol/L (ref 135–145)
TOTAL PROTEIN: 6.9 g/dL (ref 6.5–8.1)

## 2016-11-25 LAB — TROPONIN I: Troponin I: <0.03 ng/mL (ref <0.03)

## 2016-11-25 MED ORDER — TRAMADOL HCL 50 MG PO TABS: 50.0000 mg | ORAL_TABLET | Freq: Four times a day (QID) | ORAL | 0 refills | Status: AC | PRN

## 2016-11-25 NOTE — ED Notes (Signed)
Pt verbalized understanding of discharge instructions. NAD at this time. 

## 2016-11-25 NOTE — ED Provider Notes (Signed)
[facility] Emergency Department Provider Note  Time seen: 5:55 AM  I have reviewed the triage vital signs and the nursing notes.   HISTORY  Chief Complaint Fall and Chest Pain    HPI Loretta Webb is a 83 y.o. female with a past medical history of diabetes, fibromyalgia, gastric reflux, hypertension, presents to the emergency department for chest discomfort. According to the patient she had a fall approximately 10 days ago when she experienced chest pain. States she has been having some mild chest discomfort ever since the fall. She states she had physical therapy yesterday, and ever since her physical therapy she states the pain has been worse. She got nauseated this morning with the pain which concerned her so she came to the emergency department for evaluation. Denies any trouble breathing. She states the pain is somewhat worse if she takes a deep breath or moves the chest such as twists or bends or if she presses on the area. Denies any leg pain or swelling. Denies any past cardiac history.  Past Medical History:  Diagnosis Date  . Arthritis   . Chronic fatigue   . DM2 (diabetes mellitus, type 2) (HCC)   . Esophageal stricture   . Fibromyalgia   . GERD (gastroesophageal reflux disease)   . Hypertension   . Lumbar stenosis   . Osteoporosis   . Sleep apnea     Patient Active Problem List   Diagnosis Date Noted  . Cephalalgia 07/24/2015  . Neuritis or radiculitis due to rupture of lumbar intervertebral disc 01/05/2015  . Degeneration of intervertebral disc of cervical region 04/14/2014  . B12 deficiency 12/23/2013  . Benign essential HTN 12/23/2013  . Type 2 diabetes mellitus (HCC) 12/23/2013  . Obstructive apnea 12/23/2013  . Lumbar canal stenosis 09/18/2013    Past Surgical History:  Procedure Laterality Date  . ABDOMINAL HYSTERECTOMY    . APPENDECTOMY    . knee replacements    . ROTATOR CUFF REPAIR      Prior to Admission medications    Medication Sig Start Date End Date Taking? Authorizing Provider  bumetanide (BUMEX) 1 MG tablet Take 1 mg by mouth daily.  04/24/14   [provider]  busPIRone (BUSPAR) 15 MG tablet Take 15 mg by mouth 2 (two) times daily.    [provider]  DULoxetine (CYMBALTA) 60 MG capsule Take 60 mg by mouth daily.  04/24/14   [provider]  gabapentin (NEURONTIN) 300 MG capsule Take 300 mg by mouth at bedtime.  04/24/14   [provider]  lactulose (CHRONULAC) 10 GM/15ML solution Take 15 mLs (10 g total) by mouth daily as needed for severe constipation. Patient not taking: Reported on 10/26/2016 09/11/15   Rockne Menghini, MD  losartan (COZAAR) 50 MG tablet Take 50 mg by mouth daily.     [provider]  metFORMIN (GLUCOPHAGE) 1000 MG tablet Take 1,000 mg by mouth 2 (two) times daily with a meal.    [provider]  nabumetone (RELAFEN) 500 MG tablet Take 500 mg by mouth 2 (two) times daily as needed.     [provider]  pravastatin (PRAVACHOL) 40 MG tablet Take 40 mg by mouth at bedtime.  10/13/14 10/29/15  [provider]  propranolol (INDERAL) 20 MG tablet Take 20 mg by mouth 2 (two) times daily.  04/24/14   [provider]  traZODone (DESYREL) 50 MG tablet Take 100 mg by mouth at bedtime.  04/24/14   [provider]  zolpidem (AMBIEN) 10 MG tablet Take 10 mg by mouth at bedtime. Take one tablet daily at bedtime    [provider]    No Known Allergies  Family History  Problem Relation Age of Onset  . Diabetes Father   . Cancer Father   . Cerebral aneurysm Father   . Cancer Mother   . Cancer Sister   . Depression Sister     Social History Social History  Substance Use Topics  . Smoking status: Never Smoker  . Smokeless tobacco: Never Used  . Alcohol use No    Review of Systems Constitutional: Negative for fever. Cardiovascular: Positive for chest pain. Worse with movement or deep  breath. Respiratory: Negative for shortness of breath. Gastrointestinal: Negative for abdominal pain Musculoskeletal: Negative for leg pain or swelling All other ROS negative  ____________________________________________   PHYSICAL EXAM:  VITAL SIGNS: ED Triage Vitals [11/25/16 0548]  Enc Vitals Group     BP (!) 189/85     Pulse Rate 75     Resp 18     Temp 97.6 F (36.4 C)     Temp Source Oral     SpO2 97 %     Weight 158 lb (71.7 kg)     Height 5\' 4"  (1.626 m)     Head Circumference      Peak Flow      Pain Score 8     Pain Loc      Pain Edu?      Excl. in GC?     Constitutional: Alert and oriented. Well appearing and in no distress. Eyes: Normal exam ENT   Head: Normocephalic and atraumatic   Mouth/Throat: Mucous membranes are moist. Cardiovascular: Normal rate, regular rhythm. No murmur Respiratory: Normal respiratory effort without tachypnea nor retractions. Breath sounds are clear. Moderate right-sided anterior chest tenderness to palpation. Gastrointestinal: Soft and nontender. No distention. Musculoskeletal: Nontender with normal range of motion in all extremities. No lower extremity tenderness or edema. Neurologic:  Normal speech and language. No gross focal neurologic deficits Skin:  Skin is warm, dry and intact.  Psychiatric: Mood and affect are normal.   ____________________________________________    EKG  EKG reviewed and interpreted by myself shows sinus rhythm at 64 bpm, narrow QRS, normal axis, normal intervals. Patient has fairly the use T-wave inversions which is largely unchanged from prior 07/18/13.  ____________________________________________    RADIOLOGY  Chest x-ray negative  ____________________________________________   INITIAL IMPRESSION / ASSESSMENT AND PLAN / ED COURSE  Pertinent labs & imaging results that were available during my care of the patient were reviewed by me and considered in my medical decision making (see  chart for details).  Patient presents to the emergency department for chest pain after a fall 10 days ago. States the chest pain is acutely worse over the past 24 hours since physical therapy yesterday. Patient's chest pain is somewhat reproducible with palpation or movement. She does state some pain with deep inspiration as well. No leg pain or swelling. Overall she appears very well. Calm and comfortable appearing. No distress. We will check labs, chest x-ray, EKG and continue to closely monitor.  EKG does show T-wave inversions fairly diffusely however this is unchanged from prior.  Chest x-ray is negative. Labs are within normal limits with a negative troponin. We will discharge the patient with PCP follow-up. A highly suspect the patient's chest pain is musculoskeletal given its reproducibility on exam and as she has been having  chest pain since her fall 10 days ago.    ____________________________________________   FINAL CLINICAL IMPRESSION(S) / ED DIAGNOSES  Chest pain    Minna AntisPaduchowski, Graden Hoshino, MD 11/25/16 [phone]

## 2016-11-25 NOTE — Discharge Instructions (Signed)
You have been seen in the emergency department today for chest pain. Your workup has shown normal results, and your chest pain could be a result from your recent fall. As we discussed please follow-up with your primary care physician in the next 1-2 days for recheck. Return to the emergency department for any further chest pain, trouble breathing, or any other symptom personally concerning to yourself.

## 2016-11-25 NOTE — ED Triage Notes (Addendum)
Pt ambulatory to STAT, unsteady gait noted, accomp by husband; no distress noted; pt reports that she fell 10 days ago landing on her chest; did not seek any medical attention for such; has been receiving PT therapy for leg weakness and began new exercises yesterday; has been having increased pain to upper chest, lateral ribcage & sternum since with some SHOB; took 2 ibuprofen and 1/2 tab hydrocodone PTA without relief; pt taken to room 8 via w/c by EDT, Raynelle FanningJulie, to be placed on card monitor for EKG and further evaluation

## 2016-11-25 NOTE — ED Notes (Signed)
Awaiting transport.

## 2016-11-29 ENCOUNTER — Ambulatory Visit: Payer: Medicare Other | Admitting: Physical Therapy

## 2016-11-29 DIAGNOSIS — R262 Difficulty in walking, not elsewhere classified: Secondary | ICD-10-CM

## 2016-11-29 DIAGNOSIS — M5416 Radiculopathy, lumbar region: Secondary | ICD-10-CM

## 2016-11-29 DIAGNOSIS — R531 Weakness: Secondary | ICD-10-CM

## 2016-11-29 NOTE — Therapy (Signed)
[facility] PHYS[facility] AND SPORTS MEDICINE [address] [place], [address] Phone: [phone]   Fax:  [phone]  Physical Therapy Treatment  Patient Details  Name: Loretta Webb MRN: [id number] Date of Birth: 1932-01-31 Referring Provider: Merri Ray  Encounter Date: 11/29/2016    Past Medical History:  Diagnosis Date  . Arthritis   . Chronic fatigue   . DM2 (diabetes mellitus, type 2) (HCC)   . Esophageal stricture   . Fibromyalgia   . GERD (gastroesophageal reflux disease)   . Hypertension   . Lumbar stenosis   . Osteoporosis   . Sleep apnea     Past Surgical History:  Procedure Laterality Date  . ABDOMINAL HYSTERECTOMY    . APPENDECTOMY    . knee replacements    . ROTATOR CUFF REPAIR      There were no vitals filed for this visit.      Subjective Assessment - 11/29/16 1305    Subjective Patient presents with daughter, patient apparently began having severe chest pain Friday morning of last week after going through therapy session. She went to the ED, but testing was negative for any acute cardiac origin. Patient believes bridging exercises in previous session may have caused the soreness which she describes as originating from superior portion of her sternum, radiating through her abdominal region and wrapping around her rib cage.    Pertinent History Pt reports bilateral LE pain primarily in the back of the thighs. Pt reports that this has been occurring for a couple months. She denies any similar problems in the past. Pt reports sudden onset but no known trauma or change in activity. Pt has a history of a herniated disc in her low back but currently pt is not complaining of any back pain. Per medical record pt has a history of spinal stenosis. Lumbar MRI results reviewed and confirm moderate to severe spinal stenosis at L2-L3. Pain also occurs sometimes in her buttocks. She has a history of bilateral hip bursitis  which is mild in pain currently. She has had remote injections for her bursitis with relief. She describes the pain in her thighs as aching and throbbing. Easing factors: ibuprofen, hydrocodone, voltaren gel, sitting down, hasn't tried ice/heat or massage: Aggravating factors: unknown; Denies numbness/tingling. Denies bruising or swelling. Denies chills, fever, night sweats. Pt reports over the last two weeks worsening of bladder control but she is very unclear about this and it is not clear if it is accurate. Pain rarely wakes patient up at night sometimes. She does not note a difference in pain from AM to PM. Reports that the pain comes on suddenly and resolves as soon as she is able to use her voltaren gel and take pain meds. nable to notice change. Occurs approximately once/day. Worst: 9/10, Best: 0/10, Present 0/10; Pt reports she has been having frequent falls as well and has had one in January, May, and June (10/12/16). No injuries reported during falls. Two of the falls occurred when she has looked to the left and lost her balance. ROS negative for red flags with the exception of possible problems with bladder control but again this is nonspecific and poorly communicated.    Diagnostic tests Lumbar MRI: multilevel degenerative changes, of note L2-L3 moderate to severe spinal stenosis   Patient Stated Goals Resolve pain in her legs, "know why I'm falling"   Currently in Pain? Yes   Pain Score --  Patient does not provide number, but does  state she is still having moderate to severe pain in chest and abdominal region                                 PT Education - 11/29/16 1308    Education provided Yes   Education Details Will have patient begin going to the pool, use of RW at all times. Need to continue with physical activity to reduce decline physically.    Person(s) Educated Patient;Child(ren)   Methods Demonstration;Explanation   Comprehension Verbalized  understanding;Returned demonstration             PT Long Term Goals - 10/26/16 1413      PT LONG TERM GOAL #1   Title  Pt will be independent with HEP in order to improve strength and balance in order to decrease fall risk, improve function at home, and decrease bilateral thigh pain.    Time 6   Period Weeks   Status New     PT LONG TERM GOAL #2   Title Pt will decrease mODI scoreby at least 13 points in order demonstrate clinically significant reduction in pain/disability    Baseline 10/26/16: 42%   Time 6   Period Weeks   Status New     PT LONG TERM GOAL #3   Title Pt will report worst bilateral thigh pain as 6/10 in order to demonstrate clinically significant reduction in her pain   Baseline 10/26/16: worst: 9/10   Time 6   Period Weeks   Status New               Plan - 11/29/16 1309    Clinical Impression Statement Patient had a poor reaction to previous therapy session. Unclear exactly why as she had completed bridging exercises previously. She has a host of other medical co-morbidities and is being managed for difficulty sleeping as well, perhaps leading to excessive soreness noted post therapy. Bridging should not typically stress any of the thoracic musculature. Given her declining physical state (4 falls in the past few months) and poor tolerance for land based therapy, she is likely more appropriate for aquatic therapy to build activity tolerance. Discussed all aspects of the patient's case including sedentary lifestyle, DM2, fibromyalgia, chronic history of bilateral hip pain, low back pain, and falls as well as the necessity of physical activity to maintain her independence. She and her daughter were in agreement of transition to aquatic based therapy prior to starting land based therapy again.    Clinical Presentation Evolving   Clinical Decision Making Moderate   Rehab Potential Fair   Clinical Impairments Affecting Rehab Potential chronic back issues, age,  pain, memory deficits   PT Frequency 2x / week   PT Duration 6 weeks   PT Treatment/Interventions ADLs/Self Care Home Management;Aquatic Therapy;Cryotherapy;Canalith Repostioning;Electrical Stimulation;Iontophoresis 4mg /ml Dexamethasone;Moist Heat;Ultrasound;DME Instruction;Gait training;Stair training;Functional mobility training;Therapeutic activities;Patient/family education;Balance training;Therapeutic exercise;Neuromuscular re-education;Manual techniques;Vestibular   PT Next Visit Plan Initiate balance exercises, assess effectiveness of Eply's Manuever and repeat if appropriate.  Continue attempting to mechanically reproduce bilateral posterior ghith pain   PT Home Exercise Plan Single knee to chest stretch, seated hamstring stretch   Consulted and Agree with Plan of Care Patient      Patient will benefit from skilled therapeutic intervention in order to improve the following deficits and impairments:  Pain, Decreased balance, Postural dysfunction, Difficulty walking, Abnormal gait, Decreased strength  Visit Diagnosis: Weakness  Difficulty walking  Lumbar  radiculitis     Problem List Patient Active Problem List   Diagnosis Date Noted  . Cephalalgia 07/24/2015  . Neuritis or radiculitis due to rupture of lumbar intervertebral disc 01/05/2015  . Degeneration of intervertebral disc of cervical region 04/14/2014  . B12 deficiency 12/23/2013  . Benign essential HTN 12/23/2013  . Type 2 diabetes mellitus (HCC) 12/23/2013  . Obstructive apnea 12/23/2013  . Lumbar canal stenosis 09/18/2013   Alva GarnetPatrick McNamara PT, DPT, CSCS    11/29/2016, 1:13 PM  [facility] [address] [place], [place], [address] Phone: [phone]   Fax:  [phone]  Name: Loretta Webb MRN: [id number] Date of Birth: 05/20/1931

## 2016-12-01 ENCOUNTER — Ambulatory Visit: Payer: Medicare Other | Admitting: Physical Therapy

## 2016-12-06 ENCOUNTER — Ambulatory Visit: Payer: Medicare Other | Admitting: Physical Therapy

## 2016-12-06 ENCOUNTER — Encounter: Payer: Medicare Other | Admitting: Physical Therapy

## 2016-12-06 DIAGNOSIS — R262 Difficulty in walking, not elsewhere classified: Secondary | ICD-10-CM | POA: Diagnosis not present

## 2016-12-06 DIAGNOSIS — R531 Weakness: Secondary | ICD-10-CM

## 2016-12-06 DIAGNOSIS — M5416 Radiculopathy, lumbar region: Secondary | ICD-10-CM

## 2016-12-06 DIAGNOSIS — M7062 Trochanteric bursitis, left hip: Secondary | ICD-10-CM

## 2016-12-06 DIAGNOSIS — M25551 Pain in right hip: Secondary | ICD-10-CM

## 2016-12-06 NOTE — Therapy (Addendum)
[facility] [address] , [place], [address] Phone: [phone]   Fax:  [phone]  Physical Therapy Treatment  Patient Details  Name: Loretta Webb MRN: [id number] Date of Birth: 1931/12/25 Referring Provider: Merri RayBenjamin Chasnis  Encounter Date: 12/06/2016      PT End of Session - 12/06/16 0957    Visit Number 9   Number of Visits 13   Date for PT Re-Evaluation 12/07/16   Authorization Type g codes 5/10   PT Start Time 0910   PT Stop Time 1000   PT Time Calculation (min) 50 min   Activity Tolerance Patient tolerated treatment well   Behavior During Therapy [facility] for tasks assessed/performed      Past Medical History:  Diagnosis Date  . Arthritis   . Chronic fatigue   . DM2 (diabetes mellitus, type 2) (HCC)   . Esophageal stricture   . Fibromyalgia   . GERD (gastroesophageal reflux disease)   . Hypertension   . Lumbar stenosis   . Osteoporosis   . Sleep apnea     Past Surgical History:  Procedure Laterality Date  . ABDOMINAL HYSTERECTOMY    . APPENDECTOMY    . knee replacements    . ROTATOR CUFF REPAIR      There were no vitals filed for this visit.      Subjective Assessment - 12/06/16 0918    Subjective (P)  Pt reports she has not had any chest pains since her ER visit. Pt no longer perform the brdiging exercises.  Pt has pain traveling down back of B thighs to the level knee. Pt rubs Voltaren gel to relieve pain. The radaiting pain occurs when she is in bed, wake up, or trying to go to sleep.  Pt sleeps on her R side. States she does not sleep with a pillow between knees. Pt has not been walking because she has fallen so much ( 4x this year in the house).  Pt cooks, clean, buy groceries, go to church.  Pt has been sitting too long reading.  Pt thinks she knows what causes it, she thinks it is due to inactivity.     Pertinent History Pt reports bilateral LE pain primarily in the back of the  thighs. Pt reports that this has been occurring for a couple months. She denies any similar problems in the past. Pt reports sudden onset but no known trauma or change in activity. Pt has a history of a herniated disc in her low back but currently pt is not complaining of any back pain. Per medical record pt has a history of spinal stenosis. Lumbar MRI results reviewed and confirm moderate to severe spinal stenosis at L2-L3. Pain also occurs sometimes in her buttocks. She has a history of bilateral hip bursitis which is mild in pain currently. She has had remote injections for her bursitis with relief. She describes the pain in her thighs as aching and throbbing. Easing factors: ibuprofen, hydrocodone, voltaren gel, sitting down, hasn't tried ice/heat or massage: Aggravating factors: unknown; Denies numbness/tingling. Denies bruising or swelling. Denies chills, fever, night sweats. Pt reports over the last two weeks worsening of bladder control but she is very unclear about this and it is not clear if it is accurate. Pain rarely wakes patient up at night sometimes. She does not note a difference in pain from AM to PM. Reports that the pain comes on suddenly and resolves as soon as she is able to  use her voltaren gel and take pain meds. nable to notice change. Occurs approximately once/day. Worst: 9/10, Best: 0/10, Present 0/10; Pt reports she has been having frequent falls as well and has had one in January, May, and June (10/12/16). No injuries reported during falls. Two of the falls occurred when she has looked to the left and lost her balance. ROS negative for red flags with the exception of possible problems with bladder control but again this is nonspecific and poorly communicated.    Diagnostic tests Lumbar MRI: multilevel degenerative changes, of note L2-L3 moderate to severe spinal stenosis   Patient Stated Goals Resolve pain in her legs, "know why I'm falling"            [facility] -  12/06/16 0926      Palpation   SI assessment  R PSIS more inferior than L, R ASIS more anterior.  ( Post Tx: symmetrical)     Palpation comment decreased sacral mobility      Ambulation/Gait   Gait Comments narrow BOS, decreased R stance phase, trendelenberg (L SLS weak with R pelvic shift),  No change post Tx.  Adjusted squad cane to wrist height.   R shoulder lower than L                    Pelvic Floor Special Questions - 12/06/16 0949    Diastasis Recti 5 fingers width            [facility] Adult PT Treatment/Exercise - 12/06/16 0949      Therapeutic Activites    Therapeutic Activities --  see pt instructions      Exercises   Exercises --  see pt instructions      Manual Therapy   Manual therapy comments R sidelying,      Prosthetics   Prosthetic Care Comments   long axis distraction on LLE, rotational mob, inferior sacral mob, MWM on L lateral border of border with clam shells                 PT Education - 12/06/16 0957    Education provided Yes   Education Details HEP    Person(s) Educated Patient   Methods Explanation;Demonstration;Tactile cues;Verbal cues;Handout   Comprehension Returned demonstration;Verbalized understanding             PT Long Term Goals - 12/06/16 1835      PT LONG TERM GOAL #1   Title  Pt will be independent with HEP in order to improve strength and balance in order to decrease fall risk, improve function at home, and decrease bilateral thigh pain.    Time 6   Period Weeks   Status On-going     PT LONG TERM GOAL #2   Title Pt will decrease mODI scoreby at least 13 points in order demonstrate clinically significant reduction in pain/disability    Baseline 10/26/16: 42%   Time 6   Period Weeks   Status On-going     PT LONG TERM GOAL #3   Title Pt will report worst bilateral thigh pain as 6/10 in order to demonstrate clinically significant reduction in her pain   Baseline 10/26/16: worst: 9/10   Time 6    Period Weeks   Status On-going     PT LONG TERM GOAL #4   Title Pt will demo no pelvic obliquities and increased L SIJ mobility across 2 visits in order to improve gait    Time 12  Period Weeks   Status New   Target Date 02/28/17               Plan - 12/06/16 1834    Clinical Impression Statement Pt is progressing well towards her goals. Pt demo'd improved SIJ mobility following Tx today. Pt continues to benefit from skilled PT.   Rehab Potential Fair   Clinical Impairments Affecting Rehab Potential chronic back issues, age, pain, memory deficits   PT Frequency 2x / week   PT Duration 6 weeks   PT Treatment/Interventions ADLs/Self Care Home Management;Aquatic Therapy;Cryotherapy;Canalith Repostioning;Electrical Stimulation;Iontophoresis 4mg /ml Dexamethasone;Moist Heat;Ultrasound;DME Instruction;Gait training;Stair training;Functional mobility training;Therapeutic activities;Patient/family education;Balance training;Therapeutic exercise;Neuromuscular re-education;Manual techniques;Vestibular   PT Next Visit Plan Initiate balance exercises, assess effectiveness of Eply's Manuever and repeat if appropriate.  Continue attempting to mechanically reproduce bilateral posterior ghith pain   PT Home Exercise Plan Single knee to chest stretch, seated hamstring stretch   Consulted and Agree with Plan of Care Patient      Patient will benefit from skilled therapeutic intervention in order to improve the following deficits and impairments:  Pain, Decreased balance, Postural dysfunction, Difficulty walking, Abnormal gait, Decreased strength  Visit Diagnosis: Weakness  Difficulty walking  Lumbar radiculitis  Trochanteric bursitis of left hip  Right hip pain     Problem List Patient Active Problem List   Diagnosis Date Noted  . Cephalalgia 07/24/2015  . Neuritis or radiculitis due to rupture of lumbar intervertebral disc 01/05/2015  . Degeneration of intervertebral disc of  cervical region 04/14/2014  . B12 deficiency 12/23/2013  . Benign essential HTN 12/23/2013  . Type 2 diabetes mellitus (HCC) 12/23/2013  . Obstructive apnea 12/23/2013  . Lumbar canal stenosis 09/18/2013    Mariane Masters ,PT, DPT, E-RYT    12/06/2016, 6:36 PM  [facility] MAIN [facility] SERVICES [address] [place], [place], [address] Phone: [phone]   Fax:  [phone]  Name: Loretta Webb MRN: [id number] Date of Birth: 01/11/1932

## 2016-12-06 NOTE — Patient Instructions (Addendum)
Morning/ night     Clam Shell 45 Degrees   Lying with hips and knees bent 45, one pillow between knees and ankles. Lift knee with exhale. Be sure pelvis does not roll backward. Do not arch back. Do 10 times, each leg, 2 times per day.  http://ss.exer.us/75   Copyright  VHI. All rights reserved.     __________  1)  Seated exercise to strengthen stomach muscles  3 x day   Solid chair not a couch  Tapping knee as you lift foot off floor 2" ,  30 x  L and R   3x  Day  Place feet hip width  Breathing   2)  Standing at kitchen exercise to strengthen buttock muscles Both hands on counter  Feet are hip width apart  Tap foot back 10 x reps each leg  3 x day

## 2016-12-08 ENCOUNTER — Ambulatory Visit: Payer: Medicare Other | Attending: Physical Medicine and Rehabilitation | Admitting: Physical Therapy

## 2016-12-08 DIAGNOSIS — M5416 Radiculopathy, lumbar region: Secondary | ICD-10-CM | POA: Diagnosis present

## 2016-12-08 DIAGNOSIS — R262 Difficulty in walking, not elsewhere classified: Secondary | ICD-10-CM | POA: Diagnosis present

## 2016-12-08 DIAGNOSIS — R531 Weakness: Secondary | ICD-10-CM | POA: Diagnosis not present

## 2016-12-08 DIAGNOSIS — M7062 Trochanteric bursitis, left hip: Secondary | ICD-10-CM | POA: Diagnosis present

## 2016-12-08 DIAGNOSIS — M25551 Pain in right hip: Secondary | ICD-10-CM

## 2016-12-09 NOTE — Therapy (Addendum)
[facility] MAIN [facility] SERVICES [address] [place], [place], [address] Phone: [phone]   Fax:  [phone]  Physical Therapy Treatment  Patient Details  Name: Loretta Webb MRN: [id number] Date of Birth: [birth year] Referring Provider: Merri Ray  Encounter Date: 12/08/2016      PT End of Session - 12/09/16 1250    Visit Number 10   Number of Visits    Date for PT Re-Evaluation 06-Mar-2017   Authorization Type g codes 6/10   PT Start Time 0945   PT Stop Time 1030   PT Time Calculation (min) 45 min   Activity Tolerance Patient tolerated treatment well   Behavior During Therapy [facility] for tasks assessed/performed      Past Medical History:  Diagnosis Date  . Arthritis   . Chronic fatigue   . DM2 (diabetes mellitus, type 2) (HCC)   . Esophageal stricture   . Fibromyalgia   . GERD (gastroesophageal reflux disease)   . Hypertension   . Lumbar stenosis   . Osteoporosis   . Sleep apnea     Past Surgical History:  Procedure Laterality Date  . ABDOMINAL HYSTERECTOMY    . APPENDECTOMY    . knee replacements    . ROTATOR CUFF REPAIR      There were no vitals filed for this visit.      Subjective Assessment - 12/09/16 0902    Subjective Pt reports she has had no pain since last session except a twinge in the back. Pt has woken up in the morning without the pain. She feels she is able to walk a little bit better. Pt arrived without her rolling walker.    Pertinent History Pt reports bilateral LE pain primarily in the back of the thighs. Pt reports that this has been occurring for a couple months. She denies any similar problems in the past. Pt reports sudden onset but no known trauma or change in activity. Pt has a history of a herniated disc in her low back but currently pt is not complaining of any back pain. Per medical record pt has a history of spinal stenosis. Lumbar MRI results reviewed and confirm moderate to severe spinal  stenosis at L2-L3. Pain also occurs sometimes in her buttocks. She has a history of bilateral hip bursitis which is mild in pain currently. She has had remote injections for her bursitis with relief. She describes the pain in her thighs as aching and throbbing. Easing factors: ibuprofen, hydrocodone, voltaren gel, sitting down, hasn't tried ice/heat or massage: Aggravating factors: unknown; Denies numbness/tingling. Denies bruising or swelling. Denies chills, fever, night sweats. Pt reports over the last two weeks worsening of bladder control but she is very unclear about this and it is not clear if it is accurate. Pain rarely wakes patient up at night sometimes. She does not note a difference in pain from AM to PM. Reports that the pain comes on suddenly and resolves as soon as she is able to use her voltaren gel and take pain meds. nable to notice change. Occurs approximately once/day. Worst: 9/10, Best: 0/10, Present 0/10; Pt reports she has been having frequent falls as well and has had one in January, May, and June (10/12/16). No injuries reported during falls. Two of the falls occurred when she has looked to the left and lost her balance. ROS negative for red flags with the exception of possible problems with bladder control but again this is nonspecific and poorly communicated.  Diagnostic tests Lumbar MRI: multilevel degenerative changes, of note L2-L3 moderate to severe spinal stenosis   Patient Stated Goals Resolve pain in her legs, "know why I'm falling"                     Adult Aquatic Therapy - 12/09/16 0903      Aquatic Therapy Subjective   Subjective Pt reports no pain.           O: Pt entered/exited the pool via ramp with UE support on rail, gait belt donned by PT, PT provided CGA/SBA from locker room <> pool and during exercises. Pt displayed signs of unsteadiness and reaching for wall or PT for balance  50 ft =1 lap  Exercises performed in 3'6" depth   2 laps  Sidestepping along ramp L / R.cue for feet under hips instead of narrow and explained about balance    Seated: leg circles, LAQ, abduction/ add 8' 10 sit to stand  2 laps Sidestepping with buttocks against bench 4120ft L/R   3 laps L /R  Sidestepping along rail  10 ft each    hip 3 way with cue for feet under hips instead of narrow and explained about balance    Box stepping with noodles under arms 5'   Seated:  abduction/ add with UE 3'            PT Long Term Goals - 12/06/16 1835      PT LONG TERM GOAL #1   Title  Pt will be independent with HEP in order to improve strength and balance in order to decrease fall risk, improve function at home, and decrease bilateral thigh pain.    Time 6   Period Weeks   Status On-going     PT LONG TERM GOAL #2   Title Pt will decrease mODI scoreby at least 13 points in order demonstrate clinically significant reduction in pain/disability    Baseline 10/26/16: 42%   Time 6   Period Weeks   Status On-going     PT LONG TERM GOAL #3   Title Pt will report worst bilateral thigh pain as 6/10 in order to demonstrate clinically significant reduction in her pain   Baseline 10/26/16: worst: 9/10   Time 6   Period Weeks   Status On-going     PT LONG TERM GOAL #4   Title Pt will demo no pelvic obliquities and increased L SIJ mobility across 2 visits in order to improve gait    Time 12   Period Weeks   Status New   Target Date 02/28/17               Plan - 12/09/16 1251    Clinical Impression Statement Pt responded well to previous manual Tx provided by pelvic health PT as pt reported she has not had any pain in her back except a twinge in her back and has been able to walk better. Pt tolerated aquatic Tx today without report of pain and was able to complete low grade exercises. Pt arrived without her roller walker and PT provided SBA/CGA with gait belt donned and assisted to/ from lockerroom to pool.  Pt showed unsteadiness in the pool  and walking out/into  lockerroom with repeated reaching for walls and PT's hand .   Deferred pt to land appts for the rest of her POC given her risks for falls and pt has not family member to assist her in the locker room  for safety at future sessions. Pt continues to benefit from skilled PT with a focus on low grade strengthening exercises at future sessions. Pt was educated to utilize her rolling walker at home and out in the community.     Rehab Potential Fair   Clinical Impairments Affecting Rehab Potential chronic back issues, age, pain, memory deficits   PT Frequency 1x / week   PT Duration 12 weeks   PT Treatment/Interventions ADLs/Self Care Home Management;Aquatic Therapy;Cryotherapy;Canalith Repostioning;Electrical Stimulation;Iontophoresis 4mg /ml Dexamethasone;Moist Heat;Ultrasound;DME Instruction;Gait training;Stair training;Functional mobility training;Therapeutic activities;Patient/family education;Balance training;Therapeutic exercise;Neuromuscular re-education;Manual techniques;Vestibular   PT Next Visit Plan Initiate balance exercises, assess effectiveness of Eply's Manuever and repeat if appropriate.  Continue attempting to mechanically reproduce bilateral posterior ghith pain   PT Home Exercise Plan Single knee to chest stretch, seated hamstring stretch   Consulted and Agree with Plan of Care Patient      Patient will benefit from skilled therapeutic intervention in order to improve the following deficits and impairments:  Pain, Decreased balance, Postural dysfunction, Difficulty walking, Abnormal gait, Decreased strength  Visit Diagnosis: Weakness  Difficulty walking  Lumbar radiculitis  Trochanteric bursitis of left hip  Right hip pain     Problem List Patient Active Problem List   Diagnosis Date Noted  . Cephalalgia 07/24/2015  . Neuritis or radiculitis due to rupture of lumbar intervertebral disc 01/05/2015  . Degeneration of intervertebral disc of cervical  region 04/14/2014  . B12 deficiency 12/23/2013  . Benign essential HTN 12/23/2013  . Type 2 diabetes mellitus (HCC) 12/23/2013  . Obstructive apnea 12/23/2013  . Lumbar canal stenosis 09/18/2013    Mariane MastersYeung,Shin Yiing ,PT, DPT, E-RYT  12/09/2016, 1:08 PM  [facility] [address] [place], [place], [address] Phone: [phone]   Fax:  [phone]  Name: Loretta Webb MRN: [id number] Date of Birth: May 30, 1931

## 2016-12-15 ENCOUNTER — Ambulatory Visit: Payer: Medicare Other | Admitting: Physical Therapy

## 2016-12-15 DIAGNOSIS — R531 Weakness: Secondary | ICD-10-CM | POA: Diagnosis not present

## 2016-12-15 DIAGNOSIS — M25551 Pain in right hip: Secondary | ICD-10-CM

## 2016-12-15 DIAGNOSIS — M7062 Trochanteric bursitis, left hip: Secondary | ICD-10-CM

## 2016-12-15 DIAGNOSIS — R262 Difficulty in walking, not elsewhere classified: Secondary | ICD-10-CM

## 2016-12-15 DIAGNOSIS — M5416 Radiculopathy, lumbar region: Secondary | ICD-10-CM

## 2016-12-15 NOTE — Patient Instructions (Signed)
Hip extension, ( tap toe behind you on floor) while leaning on counter. 10x 3 reps each leg   Sit to stand 5 reps x 5 x day

## 2016-12-15 NOTE — Therapy (Addendum)
[facility] [address] [place], [place], [address] Phone: [phone]   Fax:  [phone]  Physical Therapy Treatment  Patient Details  Name: Loretta ChessmanRichie S Erdmann MRN: [id number] Date of Birth: [birth year] Referring Provider: Merri RayBenjamin Chasnis  Encounter Date: 12/15/2016      PT End of Session - 12/15/16 0947    Visit Number 11   Number of Visits --   Date for PT Re-Evaluation 03/02/17   Authorization Type g codes 4/10   PT Start Time 0930   PT Stop Time 0950   PT Time Calculation (min) 20 min   Activity Tolerance Patient tolerated treatment well   Behavior During Therapy [facility] for tasks assessed/performed      Past Medical History:  Diagnosis Date  . Arthritis   . Chronic fatigue   . DM2 (diabetes mellitus, type 2) (HCC)   . Esophageal stricture   . Fibromyalgia   . GERD (gastroesophageal reflux disease)   . Hypertension   . Lumbar stenosis   . Osteoporosis   . Sleep apnea     Past Surgical History:  Procedure Laterality Date  . ABDOMINAL HYSTERECTOMY    . APPENDECTOMY    . knee replacements    . ROTATOR CUFF REPAIR      There were no vitals filed for this visit.      Subjective Assessment - 12/15/16 0944    Subjective Pt reported she now only had radiating pain when sleeping and upon waking.    Pertinent History Pt reports bilateral LE pain primarily in the back of the thighs. Pt reports that this has been occurring for a couple months. She denies any similar problems in the past. Pt reports sudden onset but no known trauma or change in activity. Pt has a history of a herniated disc in her low back but currently pt is not complaining of any back pain. Per medical record pt has a history of spinal stenosis. Lumbar MRI results reviewed and confirm moderate to severe spinal stenosis at L2-L3. Pain also occurs sometimes in her buttocks. She has a history of bilateral hip bursitis which is mild in pain currently.  She has had remote injections for her bursitis with relief. She describes the pain in her thighs as aching and throbbing. Easing factors: ibuprofen, hydrocodone, voltaren gel, sitting down, hasn't tried ice/heat or massage: Aggravating factors: unknown; Denies numbness/tingling. Denies bruising or swelling. Denies chills, fever, night sweats. Pt reports over the last two weeks worsening of bladder control but she is very unclear about this and it is not clear if it is accurate. Pain rarely wakes patient up at night sometimes. She does not note a difference in pain from AM to PM. Reports that the pain comes on suddenly and resolves as soon as she is able to use her voltaren gel and take pain meds. nable to notice change. Occurs approximately once/day. Worst: 9/10, Best: 0/10, Present 0/10; Pt reports she has been having frequent falls as well and has had one in January, May, and June (10/12/16). No injuries reported during falls. Two of the falls occurred when she has looked to the left and lost her balance. ROS negative for red flags with the exception of possible problems with bladder control but again this is nonspecific and poorly communicated.    Diagnostic tests Lumbar MRI: multilevel degenerative changes, of note L2-L3 moderate to severe spinal stenosis   Patient Stated Goals Resolve pain in her legs, "know why I'm  falling"            [facility] PT Assessment - 12/15/16 1210      Ambulation/Gait   Gait Comments pt arrived with squad cane and continued to demo pelvic shift with decreased stance phase on R but with less reaching for walls, PT                      [facility] Adult PT Treatment/Exercise - 12/15/16 0945      Exercises   Exercises --  see pt instructions      Nu step 9 min load 0   Piriformis stretch  Seated, cool down   Standing hip ext with BUE at counter 10 rep x B                 PT Education - 12/15/16 0946    Education provided Yes   Education  Details HEP   Person(s) Educated Patient   Comprehension Verbalized understanding;Returned demonstration;Verbal cues required;Tactile cues required             PT Long Term Goals - 12/15/16 0947      PT LONG TERM GOAL #1   Title  Pt will be independent with HEP in order to improve strength and balance in order to decrease fall risk, improve function at home, and decrease bilateral thigh pain.    Time 6   Period Weeks   Status On-going     PT LONG TERM GOAL #2   Title Pt will decrease mODI scoreby at least 13 points in order demonstrate clinically significant reduction in pain/disability    Baseline 10/26/16: 42%   Time 6   Period Weeks   Status On-going     PT LONG TERM GOAL #3   Title Pt will report worst bilateral thigh pain as 6/10 in order to demonstrate clinically significant reduction in her pain   Baseline 10/26/16: worst: 9/10,  8/9: 6-7/10    Time 6   Period Weeks   Status On-going     PT LONG TERM GOAL #4   Title Pt will demo no pelvic obliquities and increased L SIJ mobility across 2 visits in order to improve gait    Time 12   Period Weeks   Status Achieved     PT LONG TERM GOAL #5   Title Pt will report no radiating pain during sleep and upon waking across 5 days in order to improve quality of sleep   Time 12   Period Weeks   Status New   Target Date 03/02/17               Plan - 12/15/16 1212    Clinical Impression Statement Pt arrived with squad cane with less UE reaching for furniture and PT for stability. Pt reports she only feels radiating pain with sleeping and upon waking up which signfies continued improvement. Today, pt tolerated Nu-step for 9 min, hip ext exercises, and sit-to stand exercises today without complaints. Pt voiced motivation with continuing with hthese low-grade functional exercises at home. Pt continues to benefit from land appts and skilled PT. Plan to incorporate pelvic girdle stability exercises into next session to  address pt's remaining complaint of radiating pain while sleeping.   Today's session was abbreviated due to pt's late arrival.    Rehab Potential Fair   Clinical Impairments Affecting Rehab Potential chronic back issues, age, pain, memory deficits   PT Frequency 2x / week   PT Duration  12 weeks   PT Treatment/Interventions ADLs/Self Care Home Management;Aquatic Therapy;Cryotherapy;Canalith Repostioning;Electrical Stimulation;Iontophoresis 4mg /ml Dexamethasone;Moist Heat;Ultrasound;DME Instruction;Gait training;Stair training;Functional mobility training;Therapeutic activities;Patient/family education;Balance training;Therapeutic exercise;Neuromuscular re-education;Manual techniques;Vestibular   PT Next Visit Plan Initiate balance exercises, assess effectiveness of Eply's Manuever and repeat if appropriate.  Continue attempting to mechanically reproduce bilateral posterior ghith pain   PT Home Exercise Plan Single knee to chest stretch, seated hamstring stretch   Consulted and Agree with Plan of Care Patient      Patient will benefit from skilled therapeutic intervention in order to improve the following deficits and impairments:  Pain, Decreased balance, Postural dysfunction, Difficulty walking, Abnormal gait, Decreased strength  Visit Diagnosis: Weakness  Difficulty walking  Lumbar radiculitis  Trochanteric bursitis of left hip  Right hip pain     Problem List Patient Active Problem List   Diagnosis Date Noted  . Cephalalgia 07/24/2015  . Neuritis or radiculitis due to rupture of lumbar intervertebral disc 01/05/2015  . Degeneration of intervertebral disc of cervical region 04/14/2014  . B12 deficiency 12/23/2013  . Benign essential HTN 12/23/2013  . Type 2 diabetes mellitus (HCC) 12/23/2013  . Obstructive apnea 12/23/2013  . Lumbar canal stenosis 09/18/2013    Mariane Masters ,PT, DPT, E-RYT  12/15/2016, 12:16 PM  [facility] MAIN [facility]  SERVICES [address] [place], [place], [address] Phone: [phone]   Fax:  [phone]  Name: ARIELA MOCHIZUKI MRN: [id number] Date of Birth: 03/06/1932

## 2016-12-15 NOTE — Addendum Note (Signed)
Addended by: Mariane MastersYEUNG, SHIN-YIING on: 12/15/2016 12:09 PM   Modules accepted: Orders

## 2016-12-22 ENCOUNTER — Ambulatory Visit: Payer: Medicare Other | Admitting: Physical Therapy

## 2016-12-22 DIAGNOSIS — M7062 Trochanteric bursitis, left hip: Secondary | ICD-10-CM

## 2016-12-22 DIAGNOSIS — R262 Difficulty in walking, not elsewhere classified: Secondary | ICD-10-CM

## 2016-12-22 DIAGNOSIS — M25551 Pain in right hip: Secondary | ICD-10-CM

## 2016-12-22 DIAGNOSIS — M5416 Radiculopathy, lumbar region: Secondary | ICD-10-CM

## 2016-12-22 DIAGNOSIS — R531 Weakness: Secondary | ICD-10-CM | POA: Diagnosis not present

## 2016-12-22 NOTE — Therapy (Addendum)
[facility] MAIN [facility] SERVICES [address] [place], [place], [address] Phone: [phone]   Fax:  [phone]  Physical Therapy Treatment  Patient Details  Name: Loretta Webb MRN: [id number] Date of Birth: 1931/08/12 Referring Provider: Merri RayBenjamin Chasnis  Encounter Date: 12/22/2016      PT End of Session - 12/22/16 0958    Visit Number 12   Date for PT Re-Evaluation 03/02/17   Authorization Type g codes 5/10   PT Start Time 0905   PT Stop Time 0940   PT Time Calculation (min) 35 min   Activity Tolerance Patient tolerated treatment well   Behavior During Therapy [facility] for tasks assessed/performed      Past Medical History:  Diagnosis Date  . Arthritis   . Chronic fatigue   . DM2 (diabetes mellitus, type 2) (HCC)   . Esophageal stricture   . Fibromyalgia   . GERD (gastroesophageal reflux disease)   . Hypertension   . Lumbar stenosis   . Osteoporosis   . Sleep apnea     Past Surgical History:  Procedure Laterality Date  . ABDOMINAL HYSTERECTOMY    . APPENDECTOMY    . knee replacements    . ROTATOR CUFF REPAIR      There were no vitals filed for this visit.      Subjective Assessment - 12/22/16 0947    Subjective Pt reported she feels she is "getting around better" and has been doing her exercises at home, She only feels the back of her thighs with muscle ache.     Pertinent History Pt reports bilateral LE pain primarily in the back of the thighs. Pt reports that this has been occurring for a couple months. She denies any similar problems in the past. Pt reports sudden onset but no known trauma or change in activity. Pt has a history of a herniated disc in her low back but currently pt is not complaining of any back pain. Per medical record pt has a history of spinal stenosis. Lumbar MRI results reviewed and confirm moderate to severe spinal stenosis at L2-L3. Pain also occurs sometimes in her buttocks. She has a history of  bilateral hip bursitis which is mild in pain currently. She has had remote injections for her bursitis with relief. She describes the pain in her thighs as aching and throbbing. Easing factors: ibuprofen, hydrocodone, voltaren gel, sitting down, hasn't tried ice/heat or massage: Aggravating factors: unknown; Denies numbness/tingling. Denies bruising or swelling. Denies chills, fever, night sweats. Pt reports over the last two weeks worsening of bladder control but she is very unclear about this and it is not clear if it is accurate. Pain rarely wakes patient up at night sometimes. She does not note a difference in pain from AM to PM. Reports that the pain comes on suddenly and resolves as soon as she is able to use her voltaren gel and take pain meds. nable to notice change. Occurs approximately once/day. Worst: 9/10, Best: 0/10, Present 0/10; Pt reports she has been having frequent falls as well and has had one in January, May, and June (10/12/16). No injuries reported during falls. Two of the falls occurred when she has looked to the left and lost her balance. ROS negative for red flags with the exception of possible problems with bladder control but again this is nonspecific and poorly communicated.    Diagnostic tests Lumbar MRI: multilevel degenerative changes, of note L2-L3 moderate to severe spinal stenosis   Patient  Stated Goals Resolve pain in her legs, "know why I'm falling"                         [facility] Adult PT Treatment/Exercise - 12/22/16 0957      Exercises   Exercises --  see pt instructions      Nu step: 2 load, RLE only 7 min, 10 load BLE, 5 min   6 sit to stand, pt places feet wide without cuing  3-way hip ext with single UE at counter  Mini side plank with hand at counter   seated rest with piriformis stretch              PT Long Term Goals - 12/15/16 0947      PT LONG TERM GOAL #1   Title  Pt will be independent with HEP in order to improve  strength and balance in order to decrease fall risk, improve function at home, and decrease bilateral thigh pain.    Time 6   Period Weeks   Status On-going     PT LONG TERM GOAL #2   Title Pt will decrease mODI scoreby at least 13 points in order demonstrate clinically significant reduction in pain/disability    Baseline 10/26/16: 42%   Time 6   Period Weeks   Status On-going     PT LONG TERM GOAL #3   Title Pt will report worst bilateral thigh pain as 6/10 in order to demonstrate clinically significant reduction in her pain   Baseline 10/26/16: worst: 9/10,  8/9: 6-7/10    Time 6   Period Weeks   Status On-going     PT LONG TERM GOAL #4   Title Pt will demo no pelvic obliquities and increased L SIJ mobility across 2 visits in order to improve gait    Time 12   Period Weeks   Status Achieved     PT LONG TERM GOAL #5   Title Pt will report no radiating pain during sleep and upon waking across 5 days in order to improve quality of sleep   Time 12   Period Weeks   Status New   Target Date 03/02/17               Plan - 12/22/16 9147    Clinical Impression Statement Pt continues to show improved strength with sit to stands without use of UE for support. Pt advanced in intensity with Nu Step to day and also completed right leg strengthening.  Initiated 3-way hip extension to maintain arthrokinematics of SIJ and modified sideplank at kitchen counter to strengthen trunk muscles. Plan to address trendelenberg and assess for spinal deviations at next session because pt demonstrates R hip sway with decreased R stance phase in gait.  Pt continues to benefit from skilled PT.   Rehab Potential Fair   Clinical Impairments Affecting Rehab Potential chronic back issues, age, pain, memory deficits   PT Frequency 2x / week   PT Duration 12 weeks   PT Treatment/Interventions ADLs/Self Care Home Management;Aquatic Therapy;Cryotherapy;Canalith Repostioning;Electrical Stimulation;Iontophoresis  4mg /ml Dexamethasone;Moist Heat;Ultrasound;DME Instruction;Gait training;Stair training;Functional mobility training;Therapeutic activities;Patient/family education;Balance training;Therapeutic exercise;Neuromuscular re-education;Manual techniques;Vestibular   PT Next Visit Plan Initiate balance exercises, assess effectiveness of Eply's Manuever and repeat if appropriate.  Continue attempting to mechanically reproduce bilateral posterior ghith pain   PT Home Exercise Plan Single knee to chest stretch, seated hamstring stretch   Consulted and Agree with Plan of Care Patient  Patient will benefit from skilled therapeutic intervention in order to improve the following deficits and impairments:  Pain, Decreased balance, Postural dysfunction, Difficulty walking, Abnormal gait, Decreased strength  Visit Diagnosis: Weakness  Difficulty walking  Lumbar radiculitis  Trochanteric bursitis of left hip  Right hip pain     Problem List Patient Active Problem List   Diagnosis Date Noted  . Cephalalgia 07/24/2015  . Neuritis or radiculitis due to rupture of lumbar intervertebral disc 01/05/2015  . Degeneration of intervertebral disc of cervical region 04/14/2014  . B12 deficiency 12/23/2013  . Benign essential HTN 12/23/2013  . Type 2 diabetes mellitus (HCC) 12/23/2013  . Obstructive apnea 12/23/2013  . Lumbar canal stenosis 09/18/2013    Mariane Masters ,PT, DPT, E-RYT   12/22/2016, 10:02 AM  [place] [facility] MAIN [facility] SERVICES [address] [place], [place], [address] Phone: [phone]   Fax:  [phone]  Name: Loretta Webb MRN: [id number] Date of Birth: 03-10-1932

## 2016-12-22 NOTE — Patient Instructions (Signed)
   3-way hip ext with single UE at counter  Mini side plank with hand at counter   Seated figure-4 stretches

## 2016-12-27 ENCOUNTER — Ambulatory Visit: Payer: Medicare Other | Admitting: Physical Therapy

## 2016-12-27 ENCOUNTER — Ambulatory Visit: Payer: Medicare Other

## 2016-12-27 DIAGNOSIS — R531 Weakness: Secondary | ICD-10-CM | POA: Diagnosis not present

## 2016-12-27 DIAGNOSIS — M7062 Trochanteric bursitis, left hip: Secondary | ICD-10-CM

## 2016-12-27 DIAGNOSIS — M25551 Pain in right hip: Secondary | ICD-10-CM

## 2016-12-27 DIAGNOSIS — M5416 Radiculopathy, lumbar region: Secondary | ICD-10-CM

## 2016-12-27 DIAGNOSIS — R262 Difficulty in walking, not elsewhere classified: Secondary | ICD-10-CM

## 2016-12-28 NOTE — Patient Instructions (Signed)
  Clam Shell 45 Degrees   Lying with hips and knees bent 45, one pillow between knees and ankles. Lift knee with exhale. Be sure pelvis does not roll backward. Do not arch back. Do 10 times, each leg, 2 times per day.  http://ss.exer.us/75   Copyright  VHI. All rights reserved.

## 2016-12-28 NOTE — Therapy (Addendum)
[place] [facility] MAIN [facility] SERVICES [address] [place], [place], [address] Phone: [phone]   Fax:  [phone]  Physical Therapy Treatment  Patient Details  Name: Loretta Webb MRN: [id number] Date of Birth: February 10, 1932 Referring Provider: Merri Ray  Encounter Date: 12/27/2016      PT End of Session - 12/28/16 2253    Visit Number 13   Date for PT Re-Evaluation 28-Mar-2017   Authorization Type g codes 6/10   PT Start Time 1015   PT Stop Time 1100   PT Time Calculation (min) 45 min   Activity Tolerance Patient tolerated treatment well   Behavior During Therapy [facility] for tasks assessed/performed      Past Medical History:  Diagnosis Date  . Arthritis   . Chronic fatigue   . DM2 (diabetes mellitus, type 2) (HCC)   . Esophageal stricture   . Fibromyalgia   . GERD (gastroesophageal reflux disease)   . Hypertension   . Lumbar stenosis   . Osteoporosis   . Sleep apnea     Past Surgical History:  Procedure Laterality Date  . ABDOMINAL HYSTERECTOMY    . APPENDECTOMY    . knee replacements    . ROTATOR CUFF REPAIR      There were no vitals filed for this visit.          [facility] PT Assessment - 12/28/16 2253      Observation/Other Assessments   Observations femur length discrepancy: 44 cm R greater trochanter to lateral tibial plateau, 42 cm on L      Palpation   SI assessment  R ASIS more inferior than L    Palpation comment scars over knees and hysterectomy with minor restrictions. tenderness/ tensions along L adductors / ITB      Ambulation/Gait   Gait Comments less R hip sway with shoe lift in  L shoe ( R femur longer than L)                      [facility] Adult PT Treatment/Exercise - 12/28/16 2254      Ambulation/Gait   Gait Comments less R hip sway with shoe lift in  L shoe ( R femur longer than L)      Therapeutic Activites    Therapeutic Activities --  gait assessment     Exercises   Exercises  --  clams     Manual Therapy   Manual therapy comments scar release over abdomen, STM along adductor longus/ posterior. IT BAND L    STM, MWM, rotational mob on L SIJ                PT Education - 12/28/16 2258    Education provided Yes   Education Details HEP, shoe lift in L shoe   Person(s) Educated Patient   Methods Explanation;Demonstration;Tactile cues;Verbal cues   Comprehension Verbalized understanding             PT Long Term Goals - 12/15/16 0947      PT LONG TERM GOAL #1   Title  Pt will be independent with HEP in order to improve strength and balance in order to decrease fall risk, improve function at home, and decrease bilateral thigh pain.    Time 6   Period Weeks   Status On-going     PT LONG TERM GOAL #2   Title Pt will decrease mODI scoreby at least 13 points in order demonstrate clinically significant reduction  in pain/disability    Baseline 10/26/16: 42%   Time 6   Period Weeks   Status On-going     PT LONG TERM GOAL #3   Title Pt will report worst bilateral thigh pain as 6/10 in order to demonstrate clinically significant reduction in her pain   Baseline 10/26/16: worst: 9/10,  8/9: 6-7/10    Time 6   Period Weeks   Status On-going     PT LONG TERM GOAL #4   Title Pt will demo no pelvic obliquities and increased L SIJ mobility across 2 visits in order to improve gait    Time 12   Period Weeks   Status Achieved     PT LONG TERM GOAL #5   Title Pt will report no radiating pain during sleep and upon waking across 5 days in order to improve quality of sleep   Time 12   Period Weeks   Status New   Target Date 03/02/17               Plan - 12/28/16 2253    Clinical Impression Statement Pt showed less R hip sway with L shoe lift to account of longer R femur > L and associated increased L SIJ hypomobility, L adductor longus / ITB mm tensions/ tenderness.  Assess spine,  pelvic asymmetry ( R ASIS more inferior) and address diastasis  recti at next session. Pt continues to benefit from skilled PT.    Rehab Potential Fair   Clinical Impairments Affecting Rehab Potential chronic back issues, age, pain, memory deficits   PT Frequency 2x / week   PT Duration 12 weeks   PT Treatment/Interventions ADLs/Self Care Home Management;Aquatic Therapy;Cryotherapy;Canalith Repostioning;Electrical Stimulation;Iontophoresis 4mg /ml Dexamethasone;Moist Heat;Ultrasound;DME Instruction;Gait training;Stair training;Functional mobility training;Therapeutic activities;Patient/family education;Balance training;Therapeutic exercise;Neuromuscular re-education;Manual techniques;Vestibular   PT Next Visit Plan Initiate balance exercises, assess effectiveness of Eply's Manuever and repeat if appropriate.  Continue attempting to mechanically reproduce bilateral posterior ghith pain   PT Home Exercise Plan Single knee to chest stretch, seated hamstring stretch   Consulted and Agree with Plan of Care Patient      Patient will benefit from skilled therapeutic intervention in order to improve the following deficits and impairments:  Pain, Decreased balance, Postural dysfunction, Difficulty walking, Abnormal gait, Decreased strength  Visit Diagnosis: Weakness  Difficulty walking  Lumbar radiculitis  Trochanteric bursitis of left hip  Right hip pain     Problem List Patient Active Problem List   Diagnosis Date Noted  . Cephalalgia 07/24/2015  . Neuritis or radiculitis due to rupture of lumbar intervertebral disc 01/05/2015  . Degeneration of intervertebral disc of cervical region 04/14/2014  . B12 deficiency 12/23/2013  . Benign essential HTN 12/23/2013  . Type 2 diabetes mellitus (HCC) 12/23/2013  . Obstructive apnea 12/23/2013  . Lumbar canal stenosis 09/18/2013    Mariane Masters ,PT, DPT, E-RYT  12/28/2016, 11:12 PM  [place] [facility] MAIN [facility] SERVICES [address] [place], [place], [address] Phone:  [phone]   Fax:  [phone]  Name: NAKEESHA MAYHER MRN: [id number] Date of Birth: Feb 07, 1932

## 2016-12-29 ENCOUNTER — Ambulatory Visit: Payer: Medicare Other | Admitting: Physical Therapy

## 2016-12-29 ENCOUNTER — Ambulatory Visit: Payer: Medicare Other

## 2016-12-29 DIAGNOSIS — M5416 Radiculopathy, lumbar region: Secondary | ICD-10-CM

## 2016-12-29 DIAGNOSIS — R262 Difficulty in walking, not elsewhere classified: Secondary | ICD-10-CM

## 2016-12-29 DIAGNOSIS — R531 Weakness: Secondary | ICD-10-CM | POA: Diagnosis not present

## 2016-12-29 DIAGNOSIS — M25551 Pain in right hip: Secondary | ICD-10-CM

## 2016-12-29 DIAGNOSIS — M7062 Trochanteric bursitis, left hip: Secondary | ICD-10-CM

## 2016-12-29 NOTE — Patient Instructions (Addendum)
Seated L sidebend 10 reps x 3    Continue with deep core level 2 6 min 2 x daily     To decrease abdominal strain:   Avoid straining pelvic floor, abdominal muscles , spine  Use log rolling technique instead of getting out of bed with your neck or the sit-up   Log rolling out of bed  L  arm overhead  Raise hips and scoot hips to R   Drop knees to L,  scooting L shoulder back to get completely on your L side so your shoulders, hips, and knees point to the L   Then breathe as you drop feet off bed and prop onto L elbow and  use both hands to push yourself

## 2016-12-29 NOTE — Therapy (Addendum)
[place] [facility] MAIN [facility] SERVICES [address] [place], [place], [address] Phone: [phone]   Fax:  [phone]  Physical Therapy Treatment  Patient Details  Name: Loretta Webb MRN: [id number] Date of Birth: 1932-01-10 Referring Provider: Merri Ray  Encounter Date: 12/29/2016      PT End of Session - 12/29/16 1102    Visit Number 14   Date for PT Re-Evaluation 2017/03/08   Authorization Type g codes 7/10   PT Start Time 1009   PT Stop Time 1110   PT Time Calculation (min) 61 min   Activity Tolerance Patient tolerated treatment well   Behavior During Therapy [facility] for tasks assessed/performed      Past Medical History:  Diagnosis Date  . Arthritis   . Chronic fatigue   . DM2 (diabetes mellitus, type 2) (HCC)   . Esophageal stricture   . Fibromyalgia   . GERD (gastroesophageal reflux disease)   . Hypertension   . Lumbar stenosis   . Osteoporosis   . Sleep apnea     Past Surgical History:  Procedure Laterality Date  . ABDOMINAL HYSTERECTOMY    . APPENDECTOMY    . knee replacements    . ROTATOR CUFF REPAIR      There were no vitals filed for this visit.      Subjective Assessment - 12/29/16 1014    Subjective Pt reported this moring, she had no pain in her L posterior thigh and 50% less pain in her R posterior thigh. Pt felt she was able to sleep and roll over in bed better. Pt feels her L shoe lift does help her with walking   Pertinent History Pt reports bilateral LE pain primarily in the back of the thighs. Pt reports that this has been occurring for a couple months. She denies any similar problems in the past. Pt reports sudden onset but no known trauma or change in activity. Pt has a history of a herniated disc in her low back but currently pt is not complaining of any back pain. Per medical record pt has a history of spinal stenosis. Lumbar MRI results reviewed and confirm moderate to severe spinal stenosis at L2-L3.  Pain also occurs sometimes in her buttocks. She has a history of bilateral hip bursitis which is mild in pain currently. She has had remote injections for her bursitis with relief. She describes the pain in her thighs as aching and throbbing. Easing factors: ibuprofen, hydrocodone, voltaren gel, sitting down, hasn't tried ice/heat or massage: Aggravating factors: unknown; Denies numbness/tingling. Denies bruising or swelling. Denies chills, fever, night sweats. Pt reports over the last two weeks worsening of bladder control but she is very unclear about this and it is not clear if it is accurate. Pain rarely wakes patient up at night sometimes. She does not note a difference in pain from AM to PM. Reports that the pain comes on suddenly and resolves as soon as she is able to use her voltaren gel and take pain meds. nable to notice change. Occurs approximately once/day. Worst: 9/10, Best: 0/10, Present 0/10; Pt reports she has been having frequent falls as well and has had one in January, May, and June (10/12/16). No injuries reported during falls. Two of the falls occurred when she has looked to the left and lost her balance. ROS negative for red flags with the exception of possible problems with bladder control but again this is nonspecific and poorly communicated.    Diagnostic tests  Lumbar MRI: multilevel degenerative changes, of note L2-L3 moderate to severe spinal stenosis   Patient Stated Goals Resolve pain in her legs, "know why I'm falling"            [facility] PT Assessment - 12/29/16 1016      Observation/Other Assessments   Observations L ASIS to L malleoli 91 cm, R 89 cm      Coordination   Gross Motor Movements are Fluid and Coordinated --  chest breathing,limited lateral excursion/depression ribcage     Palpation   Spinal mobility R deviation of lumbar spine at L4    Palpation comment increased tensions at border of bicep femoris / adductor longus R      Bed Mobility   Bed Mobility --   half crunch, straining abdomen                  Pelvic Floor Special Questions - 12/29/16 1053    Diastasis Recti 5 fingers width pre Tx, 2.5 fingers post Tx           [facility] Adult PT Treatment/Exercise - 12/29/16 1035      Exercises   Exercises --  clams     Manual Therapy   Manual therapy comments STM along border of bicep femoris/ adductor longusR, MWM with Lat to medial mob Grade II with L sidelbend at L 4 .. plantigrade position over table: abdominal fascial pulling with trunk rotation/ shoulder flexion  thoracic releases in hooklying                PT Education - 12/29/16 1101    Education provided Yes   Education Details HEP, switch shoe lift in R    Person(s) Educated Patient   Methods Explanation;Demonstration;Tactile cues;Verbal cues;Handout   Comprehension Verbalized understanding;Returned demonstration             PT Long Term Goals - 12/15/16 0947      PT LONG TERM GOAL #1   Title  Pt will be independent with HEP in order to improve strength and balance in order to decrease fall risk, improve function at home, and decrease bilateral thigh pain.    Time 6   Period Weeks   Status On-going     PT LONG TERM GOAL #2   Title Pt will decrease mODI scoreby at least 13 points in order demonstrate clinically significant reduction in pain/disability    Baseline 10/26/16: 42%   Time 6   Period Weeks   Status On-going     PT LONG TERM GOAL #3   Title Pt will report worst bilateral thigh pain as 6/10 in order to demonstrate clinically significant reduction in her pain   Baseline 10/26/16: worst: 9/10,  8/9: 6-7/10    Time 6   Period Weeks   Status On-going     PT LONG TERM GOAL #4   Title Pt will demo no pelvic obliquities and increased L SIJ mobility across 2 visits in order to improve gait    Time 12   Period Weeks   Status Achieved     PT LONG TERM GOAL #5   Title Pt will report no radiating pain during sleep and upon waking across  5 days in order to improve quality of sleep   Time 12   Period Weeks   Status New   Target Date 03/02/17               Plan - 12/29/16 1102    Clinical Impression  Statement Pt reports no pain in L posterior thigh and 40% improvement in R. Addressed pt's R posterior mm tensions at border of bicep femoris/ adductor, lumbar deviation ( convex to R at L4), and diastasis recti with manual Tx. Educated log rolling to minimize abdominal strain to minimize worsening of DRA. Pt demo'd correctly after practice.  Although pt showed a longer L leg compared to R, shoe lift in L shoe elicited less R posterior thigh pain. Pt demo'd more stable gait and quad cane weas raised higher. R pelvic sway still present but to a lesser degree. Pt reported feeling less pain in R posterior thigh at end of session. Pt continues to benefit from skilled PT.           Rehab Potential Fair   Clinical Impairments Affecting Rehab Potential chronic back issues, age, pain, memory deficits   PT Frequency 2x / week   PT Duration 12 weeks   PT Treatment/Interventions ADLs/Self Care Home Management;Aquatic Therapy;Cryotherapy;Canalith Repostioning;Electrical Stimulation;Iontophoresis 4mg /ml Dexamethasone;Moist Heat;Ultrasound;DME Instruction;Gait training;Stair training;Functional mobility training;Therapeutic activities;Patient/family education;Balance training;Therapeutic exercise;Neuromuscular re-education;Manual techniques;Vestibular   PT Next Visit Plan Initiate balance exercises, assess effectiveness of Eply's Manuever and repeat if appropriate.  Continue attempting to mechanically reproduce bilateral posterior ghith pain   PT Home Exercise Plan Single knee to chest stretch, seated hamstring stretch   Consulted and Agree with Plan of Care Patient      Patient will benefit from skilled therapeutic intervention in order to improve the following deficits and impairments:  Pain, Decreased balance, Postural dysfunction,  Difficulty walking, Abnormal gait, Decreased strength  Visit Diagnosis: Weakness  Lumbar radiculitis  Difficulty walking  Trochanteric bursitis of left hip  Right hip pain     Problem List Patient Active Problem List   Diagnosis Date Noted  . Cephalalgia 07/24/2015  . Neuritis or radiculitis due to rupture of lumbar intervertebral disc 01/05/2015  . Degeneration of intervertebral disc of cervical region 04/14/2014  . B12 deficiency 12/23/2013  . Benign essential HTN 12/23/2013  . Type 2 diabetes mellitus (HCC) 12/23/2013  . Obstructive apnea 12/23/2013  . Lumbar canal stenosis 09/18/2013    Mariane Masters ,PT, DPT, E-RYT   12/29/2016, 11:15 AM  [place] [facility] MAIN [facility] SERVICES [address] [place], [place], [address] Phone: [phone]   Fax:  [phone]  Name: Loretta Webb MRN: [id number] Date of Birth: 1931-07-19

## 2016-12-29 NOTE — Addendum Note (Signed)
Addended by: Mariane Masters on: 12/29/2016 01:36 PM   Modules accepted: Orders

## 2017-01-03 ENCOUNTER — Ambulatory Visit: Payer: Medicare Other | Admitting: Physical Therapy

## 2017-01-05 ENCOUNTER — Ambulatory Visit: Payer: Medicare Other

## 2017-01-05 ENCOUNTER — Encounter: Payer: Medicare Other | Admitting: Physical Therapy

## 2017-01-10 ENCOUNTER — Ambulatory Visit: Payer: Medicare Other

## 2017-01-12 ENCOUNTER — Ambulatory Visit: Payer: Medicare Other | Attending: Physical Medicine and Rehabilitation | Admitting: Physical Therapy

## 2017-01-12 ENCOUNTER — Ambulatory Visit: Payer: Medicare Other

## 2017-01-12 DIAGNOSIS — R531 Weakness: Secondary | ICD-10-CM | POA: Diagnosis present

## 2017-01-12 DIAGNOSIS — R262 Difficulty in walking, not elsewhere classified: Secondary | ICD-10-CM

## 2017-01-12 DIAGNOSIS — R2689 Other abnormalities of gait and mobility: Secondary | ICD-10-CM | POA: Insufficient documentation

## 2017-01-12 DIAGNOSIS — M5416 Radiculopathy, lumbar region: Secondary | ICD-10-CM | POA: Insufficient documentation

## 2017-01-12 DIAGNOSIS — M25551 Pain in right hip: Secondary | ICD-10-CM | POA: Insufficient documentation

## 2017-01-12 DIAGNOSIS — M7062 Trochanteric bursitis, left hip: Secondary | ICD-10-CM | POA: Insufficient documentation

## 2017-01-12 NOTE — Patient Instructions (Signed)
R lean on bed with hand in line with hip  5 sec holds, x 10 reps x 2 day   Red band at shoes, seated with feet on ground Exhale and pull apart  10 x 2 x 2 day   Red band at wrist, pull part with elbow at sides Exhale and pull part while keeping elbow at sides 10 x  2 day

## 2017-01-13 NOTE — Therapy (Signed)
[place] [facility] MAIN [facility] [address] [place], [place], [address] Phone: [phone]   Fax:  [phone]  Physical Therapy Treatment  Patient Details  Name: Loretta Webb MRN: [id number] Date of Birth: May 31, 1931 Referring Provider: Merri RayBenjamin Chasnis  Encounter Date: 01/12/2017      PT End of Session - 01/12/17 0946    Visit Number 15   Date for PT Re-Evaluation 03/02/17   Authorization Type g codes 8/10   PT Start Time 0900   PT Stop Time 0955   PT Time Calculation (min) 55 min   Activity Tolerance Patient tolerated treatment well   Behavior During Therapy [facility] for tasks assessed/performed      Past Medical History:  Diagnosis Date  . Arthritis   . Chronic fatigue   . DM2 (diabetes mellitus, type 2) (HCC)   . Esophageal stricture   . Fibromyalgia   . GERD (gastroesophageal reflux disease)   . Hypertension   . Lumbar stenosis   . Osteoporosis   . Sleep apnea     Past Surgical History:  Procedure Laterality Date  . ABDOMINAL HYSTERECTOMY    . APPENDECTOMY    . knee replacements    . ROTATOR CUFF REPAIR      There were no vitals filed for this visit.      Subjective Assessment - 01/13/17 1331    Subjective Pt reports her pain does not wake her up anymore in the middle of the night. Pt feels the posterior thigh pain upon waking but once she moves around, it is relieved.              [facility] Assessment - 01/13/17 1330      Observation/Other Assessments   Observations smooth t/f sit to stand without UE support.      Ambulation/Gait   Gait Comments minor weight bearing on squad cane, minor R pelvic sway                      [facility] Adult PT Treatment/Exercise - 01/13/17 1330      Exercises   Exercises --  see pt instructions +  9 min Nu step, no resistance,                PT Education - 01/12/17 0946    Education provided Yes   Education Details HEP   Person(s) Educated Patient   Methods Explanation;Demonstration;Tactile cues;Verbal cues;Handout   Comprehension Returned demonstration;Verbalized understanding             PT Long Term Goals - 12/15/16 0947      PT LONG TERM GOAL #1   Title  Pt will be independent with HEP in order to improve strength and balance in order to decrease fall risk, improve function at home, and decrease bilateral thigh pain.    Time 6   Period Weeks   Status On-going     PT LONG TERM GOAL #2   Title Pt will decrease mODI scoreby at least 13 points in order demonstrate clinically significant reduction in pain/disability    Baseline 10/26/16: 42%   Time 6   Period Weeks   Status On-going     PT LONG TERM GOAL #3   Title Pt will report worst bilateral thigh pain as 6/10 in order to demonstrate clinically significant reduction in her pain   Baseline 10/26/16: worst: 9/10,  8/9: 6-7/10    Time 6   Period Weeks   Status  On-going     PT LONG TERM GOAL #4   Title Pt will demo no pelvic obliquities and increased L SIJ mobility across 2 visits in order to improve gait    Time 12   Period Weeks   Status Achieved     PT LONG TERM GOAL #5   Title Pt will report no radiating pain during sleep and upon waking across 5 days in order to improve quality of sleep   Time 12   Period Weeks   Status New   Target Date 03/02/17               Plan - 01/13/17 1335    Clinical Impression Statement Pt continues to respond well to skilled PT and states she has not had any radiating pain down her legs and is able to sleep through the night.  Continues to progress her with thoracolumbar strengthening for a more upright posutre and added R trunk strengthening to minimize pelvic sway. Modified new exercises due to rounded shoulders to minimize risk for injuries.  Pt continues to benefit from skilled PT.    Rehab Potential Fair   Clinical Impairments Affecting Rehab Potential chronic back issues, age, pain, memory deficits   PT Frequency 1x /  week   PT Duration 12 weeks   PT Treatment/Interventions ADLs/Self Care Home Management;Aquatic Therapy;Cryotherapy;Canalith Repostioning;Electrical Stimulation;Iontophoresis /ml Dexamethasone;Moist Heat;Ultrasound;DME Instruction;Gait training;Stair training;Functional mobility training;Therapeutic activities;Patient/family education;Balance training;Therapeutic exercise;Neuromuscular re-education;Manual techniques;Vestibular   PT Next Visit Plan Initiate balance exercises, assess effectiveness of Eply's Manuever and repeat if appropriate.  Continue attempting to mechanically reproduce bilateral posterior ghith pain   PT Home Exercise Plan Single knee to chest stretch, seated hamstring stretch   Consulted and Agree with Plan of Care Patient      Patient will benefit from skilled therapeutic intervention in order to improve the following deficits and impairments:  Pain, Decreased balance, Postural dysfunction, Difficulty walking, Abnormal gait, Decreased strength  Visit Diagnosis: Weakness  Lumbar radiculitis  Difficulty walking  Trochanteric bursitis of left hip  Right hip pain     Problem List Patient Active Problem List   Diagnosis Date Noted  . Cephalalgia 07/24/2015  . Neuritis or radiculitis due to rupture of lumbar intervertebral disc 01/05/2015  . Degeneration of intervertebral disc of cervical region 04/14/2014  . B12 deficiency 12/23/2013  . Benign essential HTN 12/23/2013  . Type 2 diabetes mellitus (HCC) 12/23/2013  . Obstructive apnea 12/23/2013  . Lumbar canal stenosis 09/18/2013    Mariane Masters ,PT, DPT, E-RYT  01/13/2017, 1:38 PM  [place] [facility] MAIN [facility] SERVICES [address] [place], [place], [address] Phone: [phone]   Fax:  [phone]  Name: Loretta Webb MRN: [id number] Date of Birth: 05-Aug-1931

## 2017-01-17 ENCOUNTER — Ambulatory Visit: Payer: Medicare Other

## 2017-01-19 ENCOUNTER — Ambulatory Visit: Payer: Medicare Other

## 2017-01-19 ENCOUNTER — Ambulatory Visit: Payer: Medicare Other | Admitting: Physical Therapy

## 2017-01-19 ENCOUNTER — Encounter: Payer: Medicare Other | Admitting: Physical Therapy

## 2017-01-24 ENCOUNTER — Ambulatory Visit: Payer: Medicare Other

## 2017-01-26 ENCOUNTER — Ambulatory Visit: Payer: Medicare Other

## 2017-01-30 ENCOUNTER — Ambulatory Visit: Payer: Medicare Other | Admitting: Physical Therapy

## 2017-01-30 DIAGNOSIS — M25551 Pain in right hip: Secondary | ICD-10-CM

## 2017-01-30 DIAGNOSIS — R531 Weakness: Secondary | ICD-10-CM | POA: Diagnosis not present

## 2017-01-30 DIAGNOSIS — R262 Difficulty in walking, not elsewhere classified: Secondary | ICD-10-CM

## 2017-01-30 DIAGNOSIS — M5416 Radiculopathy, lumbar region: Secondary | ICD-10-CM

## 2017-01-30 DIAGNOSIS — M7062 Trochanteric bursitis, left hip: Secondary | ICD-10-CM

## 2017-01-30 NOTE — Patient Instructions (Addendum)
Reclined on pillows Loop on L foot, R foot steps on the band, R hand holds the band  L Ankle to the L  10 x 3 reps     Progressing to Deep core level 3 Marching with lifting knee up above hip  30 reps ( L and R = 1 rep)    _______________  Progression from toe tap behind--> lift foot off floor  10 reps each side   Calf stretch by counter, knee straight and knee bent  5 breaths   Doorway:  Forward stepping lunges 10 reps

## 2017-01-31 ENCOUNTER — Ambulatory Visit: Payer: Medicare Other

## 2017-01-31 ENCOUNTER — Encounter: Payer: Medicare Other | Admitting: Physical Therapy

## 2017-01-31 NOTE — Therapy (Signed)
[place] [facility] MAIN [facility] SERVICES [address] [place], [place], [address] Phone: [phone]   Fax:  [phone]  Physical Therapy Treatment  Patient Details  Name: Loretta Webb MRN: [id number] Date of Birth: 07/18/1931 Referring Provider: Merri Ray  Encounter Date: 01/30/2017    Past Medical History:  Diagnosis Date  . Arthritis   . Chronic fatigue   . DM2 (diabetes mellitus, type 2) (HCC)   . Esophageal stricture   . Fibromyalgia   . GERD (gastroesophageal reflux disease)   . Hypertension   . Lumbar stenosis   . Osteoporosis   . Sleep apnea     Past Surgical History:  Procedure Laterality Date  . ABDOMINAL HYSTERECTOMY    . APPENDECTOMY    . knee replacements    . ROTATOR CUFF REPAIR      There were no vitals filed for this visit.      Subjective Assessment - 01/30/17 1514    Subjective Pt reports she no longer feels the posterior thigh pain. Pt experiences pulling and aching in L calf more than the R. R hip ache wakes her in the night. Both of her whole leg wakes her up at night.             [facility] PT Assessment - 01/31/17 2208      Strength   Overall Strength Comments hip flex, knee flex/ext R 4-/5, L 5/5 .  L ankle eversion 4/5, R  5/5      Palpation   Palpation comment increased STM restrictions along calves L > R      Ambulation/Gait   Gait Comments increased postural stability, less pelvic sway to the R, less stumped posture                  Pelvic Floor Special Questions - 01/31/17 2208    Diastasis Recti 3 fingers along linea alba            [facility] Adult PT Treatment/Exercise - 01/31/17 2208      Exercises   Exercises --  see pt instructions     Manual Therapy   Manual therapy comments STM releases along gastroc mm B                 PT Education - 01/31/17 2212    Education provided Yes   Education Details HEP   Person(s) Educated Patient   Methods  Explanation;Demonstration;Tactile cues;Verbal cues;Handout   Comprehension Returned demonstration;Verbalized understanding             PT Long Term Goals - 01/31/17 2219      PT LONG TERM GOAL #1   Title  Pt will be independent with HEP in order to improve strength and balance in order to decrease fall risk, improve function at home, and decrease bilateral thigh pain.    Time 6   Period Weeks   Status On-going     PT LONG TERM GOAL #2   Title Pt will decrease mODI scoreby at least 13 points in order demonstrate clinically significant reduction in pain/disability    Baseline 10/26/16: 42%   Time 6   Period Weeks   Status On-going     PT LONG TERM GOAL #3   Title Pt will report worst bilateral thigh pain as 6/10 in order to demonstrate clinically significant reduction in her pain   Baseline 10/26/16: worst: 9/10,  8/9: 6-7/10    Time 6   Period Weeks  Status On-going     PT LONG TERM GOAL #4   Title Pt will demo no pelvic obliquities and increased L SIJ mobility across 2 visits in order to improve gait    Time 12   Period Weeks   Status Achieved     PT LONG TERM GOAL #5   Title Pt will report no radiating pain during sleep and upon waking across 5 days in order to improve quality of sleep   Time 12   Period Weeks   Status On-going     Additional Long Term Goals   Additional Long Term Goals Yes     PT LONG TERM GOAL #6   Title Pt will maintain 4/5 BLE strength in hip ext, flexion, knee flexion/ext, hip abduction B across 1 month in order to transition safely to community Silver Sne[employer] classes    Time 12   Period Weeks   Status New               Plan - 01/31/17 2214    Clinical Impression Statement Pt no longer has radiating pain behind her thighs.  Pt's diastasis recti, posture, gait has improved since her last session 3 weeks ago.  Pt continues to remain compliant with shoe lift wear which addresses her length difference.  Today, pt reported calf tightness  and pain bilaterally.  Following manual Tx, tightness in her calves decreased and she report of no pain.  Future sessions will address the decreased strength in her R leg compared to L, dynamic stability, and readiness to integrate into Silver Sneakers classes (pt's interest).   Progressed her strengthening exercises with more challanges to dynamic stability with BUE support in addition to quad and ankle strengthening today. Added calf stretches and reviewed other stretches for pt to perform at home. Pt had no complaints with new exercises today. Anticipate flexibility program may help minimize pt's c/o B leg pain interrupting her sleep. Plan to also communicate with dtr and MD if leg pain persists and pt may require medical tests. Dtr informed PT that pt has told dtr that she feels a different pain compared to previous pain over the past two weeks when pt had no PT sessions due to PT's schedule lacking availabilty. Pt continues to benefit from skilled PT with increased frequency of visits which include working with another PT whose schedule can accommodate pt's appts. PT will remain in communication with dtr on pt's POC.    Rehab Potential Fair   Clinical Impairments Affecting Rehab Potential chronic back issues, age, pain, memory deficits   PT Frequency 1x / week   PT Duration 12 weeks   PT Treatment/Interventions ADLs/Self Care Home Management;Aquatic Therapy;Cryotherapy;Canalith Repostioning;Electrical Stimulation;Iontophoresis /ml Dexamethasone;Moist Heat;Ultrasound;DME Instruction;Gait training;Stair training;Functional mobility training;Therapeutic activities;Patient/family education;Balance training;Therapeutic exercise;Neuromuscular re-education;Manual techniques;Vestibular   PT Next Visit Plan Initiate balance exercises, assess effectiveness of Eply's Manuever and repeat if appropriate.  Continue attempting to mechanically reproduce bilateral posterior ghith pain   PT Home Exercise Plan Single  knee to chest stretch, seated hamstring stretch   Consulted and Agree with Plan of Care Patient      Patient will benefit from skilled therapeutic intervention in order to improve the following deficits and impairments:  Pain, Decreased balance, Postural dysfunction, Difficulty walking, Abnormal gait, Decreased strength  Visit Diagnosis: Weakness  Lumbar radiculitis  Difficulty walking  Trochanteric bursitis of left hip  Right hip pain     Problem List Patient Active Problem List   Diagnosis Date Noted  .  Cephalalgia 07/24/2015  . Neuritis or radiculitis due to rupture of lumbar intervertebral disc 01/05/2015  . Degeneration of intervertebral disc of cervical region 04/14/2014  . B12 deficiency 12/23/2013  . Benign essential HTN 12/23/2013  . Type 2 diabetes mellitus (HCC) 12/23/2013  . Obstructive apnea 12/23/2013  . Lumbar canal stenosis 09/18/2013    Mariane Masters ,PT, DPT, E-RYT  01/31/2017, 10:24 PM  [facility] MAIN [facility] SERVICES [address] [place], [place], [address] Phone: [phone]   Fax:  [phone]  Name: Loretta Webb MRN: [id number] Date of Birth: 1932/04/01

## 2017-02-02 ENCOUNTER — Ambulatory Visit: Payer: Medicare Other | Admitting: Physical Therapy

## 2017-02-02 ENCOUNTER — Ambulatory Visit: Payer: Medicare Other

## 2017-02-02 DIAGNOSIS — M5416 Radiculopathy, lumbar region: Secondary | ICD-10-CM

## 2017-02-02 DIAGNOSIS — M25551 Pain in right hip: Secondary | ICD-10-CM

## 2017-02-02 DIAGNOSIS — R531 Weakness: Secondary | ICD-10-CM

## 2017-02-02 DIAGNOSIS — M7062 Trochanteric bursitis, left hip: Secondary | ICD-10-CM

## 2017-02-02 DIAGNOSIS — R262 Difficulty in walking, not elsewhere classified: Secondary | ICD-10-CM

## 2017-02-06 ENCOUNTER — Ambulatory Visit: Payer: Medicare Other | Attending: Physical Medicine and Rehabilitation

## 2017-02-06 DIAGNOSIS — R531 Weakness: Secondary | ICD-10-CM

## 2017-02-06 DIAGNOSIS — M25551 Pain in right hip: Secondary | ICD-10-CM | POA: Insufficient documentation

## 2017-02-06 DIAGNOSIS — R262 Difficulty in walking, not elsewhere classified: Secondary | ICD-10-CM | POA: Insufficient documentation

## 2017-02-06 DIAGNOSIS — M5416 Radiculopathy, lumbar region: Secondary | ICD-10-CM

## 2017-02-06 DIAGNOSIS — M7062 Trochanteric bursitis, left hip: Secondary | ICD-10-CM | POA: Insufficient documentation

## 2017-02-06 NOTE — Therapy (Signed)
[place] [facility] MAIN [facility] SERVICES [address] [place], [place], [address] Phone: [phone]   Fax:  [phone]  Physical Therapy Treatment  Patient Details  Name: Loretta Webb MRN: [id number] Date of Birth: 11/02/1931 Referring Provider: Merri Ray  Encounter Date: 02/06/2017      PT End of Session - 02/06/17 1545    Visit Number 16   Date for PT Re-Evaluation 03/11/17   Authorization Type g codes 26-Jan-2023   PT Start Time 1515   PT Stop Time 1600   PT Time Calculation (min) 45 min   Activity Tolerance Patient tolerated treatment well   Behavior During Therapy [facility] for tasks assessed/performed      Past Medical History:  Diagnosis Date  . Arthritis   . Chronic fatigue   . DM2 (diabetes mellitus, type 2) (HCC)   . Esophageal stricture   . Fibromyalgia   . GERD (gastroesophageal reflux disease)   . Hypertension   . Lumbar stenosis   . Osteoporosis   . Sleep apnea     Past Surgical History:  Procedure Laterality Date  . ABDOMINAL HYSTERECTOMY    . APPENDECTOMY    . knee replacements    . ROTATOR CUFF REPAIR      There were no vitals filed for this visit.      Subjective Assessment - 02/06/17 1518    Subjective Patient reports no pain in L leg since last week, no night pain. Reports no falls, walks very gaurded though and occasionally has to use L hand to steady self.    Pertinent History Pt reports bilateral LE pain primarily in the back of the thighs. Pt reports that this has been occurring for a couple months. She denies any similar problems in the past. Pt reports sudden onset but no known trauma or change in activity. Pt has a history of a herniated disc in her low back but currently pt is not complaining of any back pain. Per medical record pt has a history of spinal stenosis. Lumbar MRI results reviewed and confirm moderate to severe spinal stenosis at L2-L3. Pain also occurs sometimes in her buttocks. She has a  history of bilateral hip bursitis which is mild in pain currently. She has had remote injections for her bursitis with relief. She describes the pain in her thighs as aching and throbbing. Easing factors: ibuprofen, hydrocodone, voltaren gel, sitting down, hasn't tried ice/heat or massage: Aggravating factors: unknown; Denies numbness/tingling. Denies bruising or swelling. Denies chills, fever, night sweats. Pt reports over the last two weeks worsening of bladder control but she is very unclear about this and it is not clear if it is accurate. Pain rarely wakes patient up at night sometimes. She does not note a difference in pain from AM to PM. Reports that the pain comes on suddenly and resolves as soon as she is able to use her voltaren gel and take pain meds. nable to notice change. Occurs approximately once/day. Worst: 01/26/2023, Best: 0/10, Present 0/10; Pt reports she has been having frequent falls as well and has had one in January, May, and June (10/12/16). No injuries reported during falls. Two of the falls occurred when she has looked to the left and lost her balance. ROS negative for red flags with the exception of possible problems with bladder control but again this is nonspecific and poorly communicated.    Diagnostic tests Lumbar MRI: multilevel degenerative changes, of note L2-L3 moderate to severe spinal stenosis  Patient Stated Goals Resolve pain in her legs, "know why I'm falling"   Currently in Pain? No/denies      - pt reported calf tightness and pain bilaterally.  Following manual Tx, tightness in her calves decreased and she report of no pain.  Future sessions will address the decreased strength in her R leg compared to L, dynamic stability, and readiness to integrate into Silver Sneakers classes (pt's interest).   Progressed her strengthening exercises with more challanges to dynamic stability with BUE support in addition to quad and ankle strengthening today. Added calf stretches and  reviewed other stretches for pt to perform at home.     TherEx:  Sit to stands form plinth table 10x 90 ft ambulation, forward trunk posture, poor foot clearance of RLE  RTB standing  Hip extension 15x each leg  Hip abduction 15x each leg  6 step 15x each leg, single UE support and CGA. Cues for task orientation.  Side steps in // bars 4x no UE support Walking lunges with 30 second hold stretches for hip flexor 4x Seated row with PT holding RTB 20x   Neuro Re-ed Squish three nubby bosus with single UE support in a row 10x each leg, cues for decreasing UE support  Walking marches in // bars 6x length of bars single UE support. Pt. Fatigued.  step forward and back over half foam roller 12x each leg                       PT Education - 02/06/17 1606    Education provided Yes   Education Details functional body mechanics   Person(s) Educated Patient   Methods Explanation;Demonstration;Verbal cues   Comprehension Returned demonstration;Verbalized understanding             PT Long Term Goals - 01/31/17 2219      PT LONG TERM GOAL #1   Title  Pt will be independent with HEP in order to improve strength and balance in order to decrease fall risk, improve function at home, and decrease bilateral thigh pain.    Time 6   Period Weeks   Status On-going     PT LONG TERM GOAL #2   Title Pt will decrease mODI scoreby at least 13 points in order demonstrate clinically significant reduction in pain/disability    Baseline 10/26/16: 42%   Time 6   Period Weeks   Status On-going     PT LONG TERM GOAL #3   Title Pt will report worst bilateral thigh pain as 6/10 in order to demonstrate clinically significant reduction in her pain   Baseline 10/26/16: worst: 9/10,  8/9: 6-7/10    Time 6   Period Weeks   Status On-going     PT LONG TERM GOAL #4   Title Pt will demo no pelvic obliquities and increased L SIJ mobility across 2 visits in order to improve gait    Time 12    Period Weeks   Status Achieved     PT LONG TERM GOAL #5   Title Pt will report no radiating pain during sleep and upon waking across 5 days in order to improve quality of sleep   Time 12   Period Weeks   Status On-going     Additional Long Term Goals   Additional Long Term Goals Yes     PT LONG TERM GOAL #6   Title Pt will maintain 4/5 BLE strength in hip ext, flexion, knee  flexion/ext, hip abduction B across 1 month in order to transition safely to community Silver Sneaker classes    Time 12   Period Weeks   Status New               Plan - 02/06/17 1609    Clinical Impression Statement Patient demonstrates bilateral LE weakness combined with difficulty standing on single limb leading to shuffling ambulatory mechanics and need for occasional UE support. Standing strengthening interventions performed to increase patient capacity for functional activity and challenge balance for improved stability. Patient will continue to benefit from skilled physical therapy to allow for safe mobility in natural environment.    Rehab Potential Fair   Clinical Impairments Affecting Rehab Potential chronic back issues, age, pain, memory deficits   PT Frequency 1x / week   PT Duration 12 weeks   PT Treatment/Interventions ADLs/Self Care Home Management;Aquatic Therapy;Cryotherapy;Canalith Repostioning;Electrical Stimulation;Iontophoresis /ml Dexamethasone;Moist Heat;Ultrasound;DME Instruction;Gait training;Stair training;Functional mobility training;Therapeutic activities;Patient/family education;Balance training;Therapeutic exercise;Neuromuscular re-education;Manual techniques;Vestibular   PT Next Visit Plan G codes   PT Home Exercise Plan Single knee to chest stretch, seated hamstring stretch   Consulted and Agree with Plan of Care Patient      Patient will benefit from skilled therapeutic intervention in order to improve the following deficits and impairments:  Pain, Decreased balance,  Postural dysfunction, Difficulty walking, Abnormal gait, Decreased strength  Visit Diagnosis: Weakness  Difficulty walking  Lumbar radiculitis     Problem List Patient Active Problem List   Diagnosis Date Noted  . Cephalalgia 07/24/2015  . Neuritis or radiculitis due to rupture of lumbar intervertebral disc 01/05/2015  . Degeneration of intervertebral disc of cervical region 04/14/2014  . B12 deficiency 12/23/2013  . Benign essential HTN 12/23/2013  . Type 2 diabetes mellitus (HCC) 12/23/2013  . Obstructive apnea 12/23/2013  . Lumbar canal stenosis 09/18/2013  Precious Bard, PT, DPT    Precious Bard 02/06/2017, 4:10 PM  [place] [facility] MAIN [facility] SERVICES [address] [place], [place], [address] Phone: [phone]   Fax:  [phone]  Name: LATARRA EAGLETON MRN: [id number] Date of Birth: 15-May-1931

## 2017-02-08 ENCOUNTER — Ambulatory Visit: Payer: Medicare Other

## 2017-02-08 DIAGNOSIS — R531 Weakness: Secondary | ICD-10-CM

## 2017-02-08 DIAGNOSIS — M5416 Radiculopathy, lumbar region: Secondary | ICD-10-CM

## 2017-02-08 DIAGNOSIS — R262 Difficulty in walking, not elsewhere classified: Secondary | ICD-10-CM

## 2017-02-08 NOTE — Therapy (Signed)
[facility] MAIN [facility] SERVICES [address] [place], [place], [address] Phone: [phone]   Fax:  [phone]  Physical Therapy Treatment  Patient Details  Name: Loretta Webb MRN: [id number] Date of Birth: April 05, 1932 Referring Provider: Merri Ray  Encounter Date: 02/08/2017      PT End of Session - 02/08/17 0804    Visit Number 17   Date for PT Re-Evaluation 03/12/2017   Authorization Type g codes 10/10 (next will be 1/10)   PT Start Time 0800   PT Stop Time 0845   PT Time Calculation (min) 45 min   Equipment Utilized During Treatment Gait belt   Activity Tolerance Patient tolerated treatment well   Behavior During Therapy WFL for tasks assessed/performed      Past Medical History:  Diagnosis Date  . Arthritis   . Chronic fatigue   . DM2 (diabetes mellitus, type 2) (HCC)   . Esophageal stricture   . Fibromyalgia   . GERD (gastroesophageal reflux disease)   . Hypertension   . Lumbar stenosis   . Osteoporosis   . Sleep apnea     Past Surgical History:  Procedure Laterality Date  . ABDOMINAL HYSTERECTOMY    . APPENDECTOMY    . knee replacements    . ROTATOR CUFF REPAIR      There were no vitals filed for this visit.      Subjective Assessment - 02/08/17 0803    Subjective Patient reports having no back pain or leg pain. Walks very guarded. No concerns    Pertinent History Pt reports bilateral LE pain primarily in the back of the thighs. Pt reports that this has been occurring for a couple months. She denies any similar problems in the past. Pt reports sudden onset but no known trauma or change in activity. Pt has a history of a herniated disc in her low back but currently pt is not complaining of any back pain. Per medical record pt has a history of spinal stenosis. Lumbar MRI results reviewed and confirm moderate to severe spinal stenosis at L2-L3. Pain also occurs sometimes in her buttocks. She has a history of  bilateral hip bursitis which is mild in pain currently. She has had remote injections for her bursitis with relief. She describes the pain in her thighs as aching and throbbing. Easing factors: ibuprofen, hydrocodone, voltaren gel, sitting down, hasn't tried ice/heat or massage: Aggravating factors: unknown; Denies numbness/tingling. Denies bruising or swelling. Denies chills, fever, night sweats. Pt reports over the last two weeks worsening of bladder control but she is very unclear about this and it is not clear if it is accurate. Pain rarely wakes patient up at night sometimes. She does not note a difference in pain from AM to PM. Reports that the pain comes on suddenly and resolves as soon as she is able to use her voltaren gel and take pain meds. nable to notice change. Occurs approximately once/day. Worst: 9/10, Best: 0/10, Present 0/10; Pt reports she has been having frequent falls as well and has had one in January, May, and June (10/12/16). No injuries reported during falls. Two of the falls occurred when she has looked to the left and lost her balance. ROS negative for red flags with the exception of possible problems with bladder control but again this is nonspecific and poorly communicated.    Diagnostic tests Lumbar MRI: multilevel degenerative changes, of note L2-L3 moderate to severe spinal stenosis   Patient Stated Goals Resolve  pain in her legs, "know why I'm falling"   Currently in Pain? No/denies      MODI=26%  MMT  Right Left  Hip flexion 4/5 4/5  Hip Abduction 4/5 4/5  Hip Adduction 4/5 4/5  Knee Extension  4/5 4/5  Knee Flexion 4-/5 4-/5  DF 4-/5 4-/5  PF 4-/5 4-/5        TherEx:  Sit to stands form plinth table 12x with 2lb bar overhead raises  Single leg pistol squats 5x from raised plinth at top height 5x each leg, BUE support  Heel raises 15x Single leg heel raises BUE support 10x Ambulating around 3 cones with CGA. Difficulty turning left, excessive trunk flexion  and knee flexion with increased shuffling of steps. 5x.  RTB standing             Hip extension 15x each leg             Hip abduction 15x each leg   Scapular retractions seated with towel rolled between shoulder blades  Seated knee extension with 3 sec hold 10x each side    Neuro Re-ed Marching in// bars with no UE support 2x20 step forward and back over orange hurdle 15x each leg, single UE, cueing for lifting LE higher Airex pad balance: 2 min, occasional forward trunk lean with pt demonstrating ability to return to COM ind.     Pt. response to medical necessity:  Patient will continue to benefit from skilled physical therapy to allow for safe mobility in natural environment.                      PT Education - 02/08/17 0804    Education provided Yes   Education Details functional body  mechanics   Person(s) Educated Patient   Methods Explanation;Demonstration;Verbal cues   Comprehension Verbalized understanding;Returned demonstration             PT Long Term Goals - 02/08/17 0847      PT LONG TERM GOAL #1   Title  Pt will be independent with HEP in order to improve strength and balance in order to decrease fall risk, improve function at home, and decrease bilateral thigh pain.    Baseline HEP given/modified   Time 6   Period Weeks   Status On-going     PT LONG TERM GOAL #2   Title Pt will decrease mODI scoreby at least 13 points in order demonstrate clinically significant reduction in pain/disability    Baseline 10/26/16: 42% 10/3: 26%   Time 6   Period Weeks   Status On-going     PT LONG TERM GOAL #3   Title Pt will report worst bilateral thigh pain as 6/10 in order to demonstrate clinically significant reduction in her pain   Baseline 10/26/16: worst: 9/10,  8/9: 6-7/10 10/3: no pain   Time 6   Period Weeks   Status Achieved     PT LONG TERM GOAL #4   Title Pt will demo no pelvic obliquities and increased L SIJ mobility across 2 visits in  order to improve gait    Time 12   Period Weeks   Status Achieved     PT LONG TERM GOAL #5   Title Pt will report no radiating pain during sleep and upon waking across 5 days in order to improve quality of sleep   Baseline reports no pain    Time 12   Period Weeks   Status Achieved  PT LONG TERM GOAL #6   Title Pt will maintain 4/5 BLE strength in hip ext, flexion, knee flexion/ext, hip abduction B across 1 month in order to transition safely to community Silver Sne[employer] classes    Baseline 10/3: 4/5 gross strength with knee flexion and ankles 4-/5   Time 12   Period Weeks   Status On-going               Plan - 2017-02-12 1610    Clinical Impression Statement Patient had gross 4-/5 LE strength bilaterally. MODI=26%. Patient has difficulty turning left, forward flexion of trunk and excessive flexion of bilateral knees occurred with turning both directions. Patient will continue to benefit from skilled physical therapy to allow for safe mobility in natural environment.    Rehab Potential Fair   Clinical Impairments Affecting Rehab Potential chronic back issues, age, pain, memory deficits   PT Frequency 1x / week   PT Duration 12 weeks   PT Treatment/Interventions ADLs/Self Care Home Management;Aquatic Therapy;Cryotherapy;Canalith Repostioning;Electrical Stimulation;Iontophoresis /ml Dexamethasone;Moist Heat;Ultrasound;DME Instruction;Gait training;Stair training;Functional mobility training;Therapeutic activities;Patient/family education;Balance training;Therapeutic exercise;Neuromuscular re-education;Manual techniques;Vestibular   PT Next Visit Plan turning around cones and corners   PT Home Exercise Plan Single knee to chest stretch, seated hamstring stretch   Consulted and Agree with Plan of Care Patient      Patient will benefit from skilled therapeutic intervention in order to improve the following deficits and impairments:  Pain, Decreased balance, Postural  dysfunction, Difficulty walking, Abnormal gait, Decreased strength  Visit Diagnosis: Weakness  Difficulty walking  Lumbar radiculitis       G-Codes - 2017/02/12 0855    Functional Assessment Tool Used (Outpatient Only) MMT, MODI, clinical judgement, VAS,    Functional Limitation Mobility: Walking and moving around   Mobility: Walking and Moving Around Current Status (R6045) At least 20 percent but less than 40 percent impaired, limited or restricted   Mobility: Walking and Moving Around Goal Status [phone]) At least 1 percent but less than 20 percent impaired, limited or restricted      Problem List Patient Active Problem List   Diagnosis Date Noted  . Cephalalgia 07/24/2015  . Neuritis or radiculitis due to rupture of lumbar intervertebral disc 01/05/2015  . Degeneration of intervertebral disc of cervical region 04/14/2014  . B12 deficiency 12/23/2013  . Benign essential HTN 12/23/2013  . Type 2 diabetes mellitus (HCC) 12/23/2013  . Obstructive apnea 12/23/2013  . Lumbar canal stenosis 09/18/2013   Precious Bard, PT, DPT   Precious Bard 02/12/17, 8:58 AM  [place] [facility] MAIN [facility] SERVICES [address] [place], [place], [address] Phone: [phone]   Fax:  [phone]  Name: Loretta Webb MRN: [id number] Date of Birth: 03/11/1932

## 2017-02-14 ENCOUNTER — Ambulatory Visit: Payer: Medicare Other

## 2017-02-14 DIAGNOSIS — M5416 Radiculopathy, lumbar region: Secondary | ICD-10-CM

## 2017-02-14 DIAGNOSIS — R262 Difficulty in walking, not elsewhere classified: Secondary | ICD-10-CM

## 2017-02-14 DIAGNOSIS — R531 Weakness: Secondary | ICD-10-CM | POA: Diagnosis not present

## 2017-02-14 NOTE — Therapy (Signed)
[place] [facility] MAIN [facility] SERVICES [address] [place], [place], [address] Phone: [phone]   Fax:  [phone]  Physical Therapy Treatment  Patient Details  Name: Loretta Webb MRN: [id number] Date of Birth: 1931-11-08 Referring Provider: Merri Ray  Encounter Date: 02/14/2017      PT End of Session - 02/14/17 0848    Visit Number 18   Date for PT Re-Evaluation 03-03-2017   Authorization Type G codes 1/10   PT Start Time 0845   PT Stop Time 0930   PT Time Calculation (min) 45 min   Equipment Utilized During Treatment Gait belt   Activity Tolerance Patient tolerated treatment well   Behavior During Therapy [facility] for tasks assessed/performed      Past Medical History:  Diagnosis Date  . Arthritis   . Chronic fatigue   . DM2 (diabetes mellitus, type 2) (HCC)   . Esophageal stricture   . Fibromyalgia   . GERD (gastroesophageal reflux disease)   . Hypertension   . Lumbar stenosis   . Osteoporosis   . Sleep apnea     Past Surgical History:  Procedure Laterality Date  . ABDOMINAL HYSTERECTOMY    . APPENDECTOMY    . knee replacements    . ROTATOR CUFF REPAIR      There were no vitals filed for this visit.      Subjective Assessment - 02/14/17 0846    Subjective Patient is a little fatigued from having some housework done yesterday. No questions or concerns.  Reports not walking much over the weekend.    Pertinent History Pt reports bilateral LE pain primarily in the back of the thighs. Pt reports that this has been occurring for a couple months. She denies any similar problems in the past. Pt reports sudden onset but no known trauma or change in activity. Pt has a history of a herniated disc in her low back but currently pt is not complaining of any back pain. Per medical record pt has a history of spinal stenosis. Lumbar MRI results reviewed and confirm moderate to severe spinal stenosis at L2-L3. Pain also occurs sometimes in  her buttocks. She has a history of bilateral hip bursitis which is mild in pain currently. She has had remote injections for her bursitis with relief. She describes the pain in her thighs as aching and throbbing. Easing factors: ibuprofen, hydrocodone, voltaren gel, sitting down, hasn't tried ice/heat or massage: Aggravating factors: unknown; Denies numbness/tingling. Denies bruising or swelling. Denies chills, fever, night sweats. Pt reports over the last two weeks worsening of bladder control but she is very unclear about this and it is not clear if it is accurate. Pain rarely wakes patient up at night sometimes. She does not note a difference in pain from AM to PM. Reports that the pain comes on suddenly and resolves as soon as she is able to use her voltaren gel and take pain meds. nable to notice change. Occurs approximately once/day. Worst: 9/10, Best: 0/10, Present 0/10; Pt reports she has been having frequent falls as well and has had one in January, May, and June (10/12/16). No injuries reported during falls. Two of the falls occurred when she has looked to the left and lost her balance. ROS negative for red flags with the exception of possible problems with bladder control but again this is nonspecific and poorly communicated.    Diagnostic tests Lumbar MRI: multilevel degenerative changes, of note L2-L3 moderate to severe spinal stenosis  Patient Stated Goals Resolve pain in her legs, "know why I'm falling"   Currently in Pain? No/denies       Nustep Lvl 3 3 minutes  TherEx  Quantum Leg press 60lb 2x15: cues for body  Mechanics, not allowing machine to slam down at end range-control eccentric movement  Quantum leg press, single limb press 30lb 2x10 each leg   3lb ankle weights  Straight leg hip flexion 12x each leg  Straight leg hip extension 2x12 each leg  Hamstring curls 2x12 each leg   Abduction 10x each leg   Seated knee extension 10x each leg   Large steps with long pause for  stretch in // bars 2x length of bars BUE support  Seated hamstring stretch 2x 60 seconds    Neuro Re-ed-CGA   Airex pad balance : 75 seconds frequent anterior LOB airex pad: marches 2 minutes airex pad: eyes closed 2x 30 seconds with frequent trunk sway/LOB   Pt. response to medical necessity: . Patient will continue to benefit from skilled physical therapy to allow for safe mobility in natural environment.                        PT Education - 02/14/17 0847    Education provided Yes   Education Details functional strengthening and body mechanics   Person(s) Educated Patient   Methods Explanation;Demonstration;Verbal cues   Comprehension Verbalized understanding;Returned demonstration             PT Long Term Goals - 02/08/17 0847      PT LONG TERM GOAL #1   Title  Pt will be independent with HEP in order to improve strength and balance in order to decrease fall risk, improve function at home, and decrease bilateral thigh pain.    Baseline HEP given/modified   Time 6   Period Weeks   Status On-going     PT LONG TERM GOAL #2   Title Pt will decrease mODI scoreby at least 13 points in order demonstrate clinically significant reduction in pain/disability    Baseline 10/26/16: 42% 10/3: 26%   Time 6   Period Weeks   Status On-going     PT LONG TERM GOAL #3   Title Pt will report worst bilateral thigh pain as 6/10 in order to demonstrate clinically significant reduction in her pain   Baseline 10/26/16: worst: 9/10,  8/9: 6-7/10 10/3: no pain   Time 6   Period Weeks   Status Achieved     PT LONG TERM GOAL #4   Title Pt will demo no pelvic obliquities and increased L SIJ mobility across 2 visits in order to improve gait    Time 12   Period Weeks   Status Achieved     PT LONG TERM GOAL #5   Title Pt will report no radiating pain during sleep and upon waking across 5 days in order to improve quality of sleep   Baseline reports no pain    Time 12    Period Weeks   Status Achieved     PT LONG TERM GOAL #6   Title Pt will maintain 4/5 BLE strength in hip ext, flexion, knee flexion/ext, hip abduction B across 1 month in order to transition safely to community Silver Sne[employer] classes    Baseline 10/3: 4/5 gross strength with knee flexion and ankles 4-/5   Time 12   Period Weeks   Status On-going  Plan - 02/14/17 0923    Clinical Impression Statement Patient educated on strengthening body mechanics on machines and ankle weights to allow for introduction into exercise classes/gym. Patient had frequent forward LOB on unstable surfaces. Patient will continue to benefit from skilled physical therapy to allow for safe mobility in natural environment.    Rehab Potential Fair   Clinical Impairments Affecting Rehab Potential chronic back issues, age, pain, memory deficits   PT Frequency 1x / week   PT Duration 12 weeks   PT Treatment/Interventions ADLs/Self Care Home Management;Aquatic Therapy;Cryotherapy;Canalith Repostioning;Electrical Stimulation;Iontophoresis /ml Dexamethasone;Moist Heat;Ultrasound;DME Instruction;Gait training;Stair training;Functional mobility training;Therapeutic activities;Patient/family education;Balance training;Therapeutic exercise;Neuromuscular re-education;Manual techniques;Vestibular   PT Next Visit Plan turning around cones and corners   PT Home Exercise Plan Single knee to chest stretch, seated hamstring stretch   Consulted and Agree with Plan of Care Patient      Patient will benefit from skilled therapeutic intervention in order to improve the following deficits and impairments:  Pain, Decreased balance, Postural dysfunction, Difficulty walking, Abnormal gait, Decreased strength  Visit Diagnosis: Weakness  Difficulty walking  Lumbar radiculitis     Problem List Patient Active Problem List   Diagnosis Date Noted  . Cephalalgia 07/24/2015  . Neuritis or radiculitis due to rupture  of lumbar intervertebral disc 01/05/2015  . Degeneration of intervertebral disc of cervical region 04/14/2014  . B12 deficiency 12/23/2013  . Benign essential HTN 12/23/2013  . Type 2 diabetes mellitus (HCC) 12/23/2013  . Obstructive apnea 12/23/2013  . Lumbar canal stenosis 09/18/2013   Precious Bard, PT, DPT   Precious Bard 02/14/2017, 9:36 AM  [facility] MAIN [facility] SERVICES [address] [place], [place], [address] Phone: [phone]   Fax:  [phone]  Name: Loretta Webb MRN: [id number] Date of Birth: 1931/06/06

## 2017-02-15 ENCOUNTER — Ambulatory Visit: Payer: Medicare Other

## 2017-02-15 DIAGNOSIS — R531 Weakness: Secondary | ICD-10-CM | POA: Diagnosis not present

## 2017-02-15 DIAGNOSIS — R262 Difficulty in walking, not elsewhere classified: Secondary | ICD-10-CM

## 2017-02-15 DIAGNOSIS — M5416 Radiculopathy, lumbar region: Secondary | ICD-10-CM

## 2017-02-15 NOTE — Therapy (Signed)
[place] [facility] MAIN [facility] SERVICES [address] [place], [place], [address] Phone: [phone]   Fax:  [phone]  Physical Therapy Treatment  Patient Details  Name: Loretta Webb MRN: [id number] Date of Birth: Sep 16, 1931 Referring Provider: Merri Ray  Encounter Date: 02/15/2017      PT End of Session - 02/15/17 0841    Visit Number 19   Date for PT Re-Evaluation 03-27-2017   Authorization Type G codes 2022/07/13   PT Start Time 0845   PT Stop Time 0930   PT Time Calculation (min) 45 min   Equipment Utilized During Treatment Gait belt   Activity Tolerance Patient tolerated treatment well   Behavior During Therapy [facility] for tasks assessed/performed      Past Medical History:  Diagnosis Date  . Arthritis   . Chronic fatigue   . DM2 (diabetes mellitus, type 2) (HCC)   . Esophageal stricture   . Fibromyalgia   . GERD (gastroesophageal reflux disease)   . Hypertension   . Lumbar stenosis   . Osteoporosis   . Sleep apnea     Past Surgical History:  Procedure Laterality Date  . ABDOMINAL HYSTERECTOMY    . APPENDECTOMY    . knee replacements    . ROTATOR CUFF REPAIR      There were no vitals filed for this visit.      Subjective Assessment - 02/15/17 0846    Subjective Patient reports being a little fatigued from previous session. Visited with brother yesterday. Will go grocery shopping after session.    Pertinent History Pt reports bilateral LE pain primarily in the back of the thighs. Pt reports that this has been occurring for a couple months. She denies any similar problems in the past. Pt reports sudden onset but no known trauma or change in activity. Pt has a history of a herniated disc in her low back but currently pt is not complaining of any back pain. Per medical record pt has a history of spinal stenosis. Lumbar MRI results reviewed and confirm moderate to severe spinal stenosis at L2-L3. Pain also occurs sometimes in her  buttocks. She has a history of bilateral hip bursitis which is mild in pain currently. She has had remote injections for her bursitis with relief. She describes the pain in her thighs as aching and throbbing. Easing factors: ibuprofen, hydrocodone, voltaren gel, sitting down, hasn't tried ice/heat or massage: Aggravating factors: unknown; Denies numbness/tingling. Denies bruising or swelling. Denies chills, fever, night sweats. Pt reports over the last two weeks worsening of bladder control but she is very unclear about this and it is not clear if it is accurate. Pain rarely wakes patient up at night sometimes. She does not note a difference in pain from AM to PM. Reports that the pain comes on suddenly and resolves as soon as she is able to use her voltaren gel and take pain meds. nable to notice change. Occurs approximately once/day. Worst: 9/10, Best: 0/10, Present 0/10; Pt reports she has been having frequent falls as well and has had one in January, May, and June (10/12/16). No injuries reported during falls. Two of the falls occurred when she has looked to the left and lost her balance. ROS negative for red flags with the exception of possible problems with bladder control but again this is nonspecific and poorly communicated.    Diagnostic tests Lumbar MRI: multilevel degenerative changes, of note L2-L3 moderate to severe spinal stenosis   Patient Stated Goals  Resolve pain in her legs, "know why I'm falling"   Currently in Pain? No/denies      TherEx: Sit to stands 5lb bar overhead raises 2x10  Quantum Leg press 75lb 2x15: cues for body  Mechanics, not allowing machine to slam down at end range-control eccentric movement   Quantum leg press, single limb press 30lb 2x10 each leg   4lb ankle weights             Straight leg hip flexion 2x10 each leg             Straight leg hip extension 2x12 each leg             Hamstring curls 2x12 each leg              Abduction 2x10 each leg                 Neuro Re-ed  static balance tossing ball back and forth with PT 30 tosses no LOB airex balance beam tandem walk, 4x length (require single UE support), side step 4x length        Pt. response to medical necessity: . Patient will continue to benefit from skilled physical therapy to allow for safe mobility in natural environment.                    PT Education - 02/15/17 0849    Education provided Yes   Education Details functional strength and body mechanics   Person(s) Educated Patient   Methods Explanation;Demonstration;Verbal cues   Comprehension Verbalized understanding;Returned demonstration             PT Long Term Goals - 02/08/17 0847      PT LONG TERM GOAL #1   Title  Pt will be independent with HEP in order to improve strength and balance in order to decrease fall risk, improve function at home, and decrease bilateral thigh pain.    Baseline HEP given/modified   Time 6   Period Weeks   Status On-going     PT LONG TERM GOAL #2   Title Pt will decrease mODI scoreby at least 13 points in order demonstrate clinically significant reduction in pain/disability    Baseline 10/26/16: 42% 10/3: 26%   Time 6   Period Weeks   Status On-going     PT LONG TERM GOAL #3   Title Pt will report worst bilateral thigh pain as 6/10 in order to demonstrate clinically significant reduction in her pain   Baseline 10/26/16: worst: 9/10,  8/9: 6-7/10 10/3: no pain   Time 6   Period Weeks   Status Achieved     PT LONG TERM GOAL #4   Title Pt will demo no pelvic obliquities and increased L SIJ mobility across 2 visits in order to improve gait    Time 12   Period Weeks   Status Achieved     PT LONG TERM GOAL #5   Title Pt will report no radiating pain during sleep and upon waking across 5 days in order to improve quality of sleep   Baseline reports no pain    Time 12   Period Weeks   Status Achieved     PT LONG TERM GOAL #6   Title Pt will maintain 4/5  BLE strength in hip ext, flexion, knee flexion/ext, hip abduction B across 1 month in order to transition safely to community Silver Sne[employer] classes    Baseline 10/3: 4/5 gross strength  with knee flexion and ankles 4-/5   Time 12   Period Weeks   Status On-going               Plan - 02/15/17 0920    Clinical Impression Statement Patient progressing with functional strengthening and body mechanics. Increase in resistance of weights for LE implemented. RLE was more challenged by weights today than LLE. Patient will continue to benefit from skilled physical therapy to allow for safe mobility in natural environment.    Rehab Potential Fair   Clinical Impairments Affecting Rehab Potential chronic back issues, age, pain, memory deficits   PT Frequency 1x / week   PT Duration 12 weeks   PT Treatment/Interventions ADLs/Self Care Home Management;Aquatic Therapy;Cryotherapy;Canalith Repostioning;Electrical Stimulation;Iontophoresis /ml Dexamethasone;Moist Heat;Ultrasound;DME Instruction;Gait training;Stair training;Functional mobility training;Therapeutic activities;Patient/family education;Balance training;Therapeutic exercise;Neuromuscular re-education;Manual techniques;Vestibular   PT Next Visit Plan turning around cones and corners   PT Home Exercise Plan Single knee to chest stretch, seated hamstring stretch   Consulted and Agree with Plan of Care Patient      Patient will benefit from skilled therapeutic intervention in order to improve the following deficits and impairments:  Pain, Decreased balance, Postural dysfunction, Difficulty walking, Abnormal gait, Decreased strength  Visit Diagnosis: Weakness  Difficulty walking  Lumbar radiculitis     Problem List Patient Active Problem List   Diagnosis Date Noted  . Cephalalgia 07/24/2015  . Neuritis or radiculitis due to rupture of lumbar intervertebral disc 01/05/2015  . Degeneration of intervertebral disc of cervical region  04/14/2014  . B12 deficiency 12/23/2013  . Benign essential HTN 12/23/2013  . Type 2 diabetes mellitus (HCC) 12/23/2013  . Obstructive apnea 12/23/2013  . Lumbar canal stenosis 09/18/2013   Precious Bard, PT, DPT   Precious Bard 02/15/2017, 9:31 AM  [place] [facility] MAIN [facility] SERVICES [address] [place], [place], [address] Phone: [phone]   Fax:  [phone]  Name: DANAYE SOBH MRN: [id number] Date of Birth: Aug 08, 1931

## 2017-02-16 ENCOUNTER — Ambulatory Visit: Payer: Medicare Other

## 2017-02-21 ENCOUNTER — Ambulatory Visit: Payer: Medicare Other

## 2017-02-21 DIAGNOSIS — R531 Weakness: Secondary | ICD-10-CM

## 2017-02-21 DIAGNOSIS — R262 Difficulty in walking, not elsewhere classified: Secondary | ICD-10-CM

## 2017-02-21 DIAGNOSIS — M5416 Radiculopathy, lumbar region: Secondary | ICD-10-CM

## 2017-02-21 NOTE — Therapy (Signed)
[place] [facility] MAIN [facility] SERVICES [address] [place], [place], [address] Phone: [phone]   Fax:  [phone]  Physical Therapy Treatment  Patient Details  Name: Loretta Webb MRN: [id number] Date of Birth: 08-28-1931 Referring Provider: Merri Ray  Encounter Date: 02/21/2017      PT End of Session - 02/21/17 0843    Visit Number 20   Date for PT Re-Evaluation 03-23-17   Authorization Type G codes August 07, 2022   PT Start Time 0845   PT Stop Time 0930   PT Time Calculation (min) 45 min   Equipment Utilized During Treatment Gait belt   Activity Tolerance Patient tolerated treatment well   Behavior During Therapy [facility] for tasks assessed/performed      Past Medical History:  Diagnosis Date  . Arthritis   . Chronic fatigue   . DM2 (diabetes mellitus, type 2) (HCC)   . Esophageal stricture   . Fibromyalgia   . GERD (gastroesophageal reflux disease)   . Hypertension   . Lumbar stenosis   . Osteoporosis   . Sleep apnea     Past Surgical History:  Procedure Laterality Date  . ABDOMINAL HYSTERECTOMY    . APPENDECTOMY    . knee replacements    . ROTATOR CUFF REPAIR      There were no vitals filed for this visit.       Nustep Lvl 3 3 minutes     Subjective Assessment - 02/21/17 0848    Subjective Patient had family over due to the power outage, Had a full house, no aches or pains. No pain or falls since last visit.    Pertinent History Pt reports bilateral LE pain primarily in the back of the thighs. Pt reports that this has been occurring for a couple months. She denies any similar problems in the past. Pt reports sudden onset but no known trauma or change in activity. Pt has a history of a herniated disc in her low back but currently pt is not complaining of any back pain. Per medical record pt has a history of spinal stenosis. Lumbar MRI results reviewed and confirm moderate to severe spinal stenosis at L2-L3. Pain also  occurs sometimes in her buttocks. She has a history of bilateral hip bursitis which is mild in pain currently. She has had remote injections for her bursitis with relief. She describes the pain in her thighs as aching and throbbing. Easing factors: ibuprofen, hydrocodone, voltaren gel, sitting down, hasn't tried ice/heat or massage: Aggravating factors: unknown; Denies numbness/tingling. Denies bruising or swelling. Denies chills, fever, night sweats. Pt reports over the last two weeks worsening of bladder control but she is very unclear about this and it is not clear if it is accurate. Pain rarely wakes patient up at night sometimes. She does not note a difference in pain from AM to PM. Reports that the pain comes on suddenly and resolves as soon as she is able to use her voltaren gel and take pain meds. nable to notice change. Occurs approximately once/day. Worst: 9/10, Best: 0/10, Present 0/10; Pt reports she has been having frequent falls as well and has had one in January, May, and June (10/12/16). No injuries reported during falls. Two of the falls occurred when she has looked to the left and lost her balance. ROS negative for red flags with the exception of possible problems with bladder control but again this is nonspecific and poorly communicated.    Diagnostic tests Lumbar MRI: multilevel  degenerative changes, of note L2-L3 moderate to severe spinal stenosis   Patient Stated Goals Resolve pain in her legs, "know why I'm falling"   Currently in Pain? No/denies     TherEx: Sit to stands 5lb bar overhead raises 2x10; improved body mechanics; occasional verbal cueing for sequencing of UE raise.    Quantum Leg press 75lb 2x15: cues for body  Mechanics, not allowing machine to slam down at end range-control eccentric movement; performed 90 lb x 5 tries   Quantum leg press, single limb press 30lb 1x15 each leg ; 45 lb 1x8 each leg    6" step:  Forward step ups 20x SUE support  Lateral step ups 20x  each leg; UE support.    High knee marching in // bars 4x;    Neuro Re-ed static balance tossing ball back and forth with PT 30 tosses no LOB airex : static balance 60 seconds, marching 60 seconds with 2 LOB, horizontal head turns 60 seconds, vertical head turns 60 seconds     Pt. response to medical necessity: . Patient will continue to benefit from skilled physical therapy to allow for safe mobility in natural environment.           PT Education - 02/21/17 0847    Education provided Yes   Education Details strengthening body mechanics   Person(s) Educated Patient   Methods Explanation;Demonstration   Comprehension Verbalized understanding;Returned demonstration             PT Long Term Goals - 02/08/17 0847      PT LONG TERM GOAL #1   Title  Pt will be independent with HEP in order to improve strength and balance in order to decrease fall risk, improve function at home, and decrease bilateral thigh pain.    Baseline HEP given/modified   Time 6   Period Weeks   Status On-going     PT LONG TERM GOAL #2   Title Pt will decrease mODI scoreby at least 13 points in order demonstrate clinically significant reduction in pain/disability    Baseline 10/26/16: 42% 10/3: 26%   Time 6   Period Weeks   Status On-going     PT LONG TERM GOAL #3   Title Pt will report worst bilateral thigh pain as 6/10 in order to demonstrate clinically significant reduction in her pain   Baseline 10/26/16: worst: 9/10,  8/9: 6-7/10 10/3: no pain   Time 6   Period Weeks   Status Achieved     PT LONG TERM GOAL #4   Title Pt will demo no pelvic obliquities and increased L SIJ mobility across 2 visits in order to improve gait    Time 12   Period Weeks   Status Achieved     PT LONG TERM GOAL #5   Title Pt will report no radiating pain during sleep and upon waking across 5 days in order to improve quality of sleep   Baseline reports no pain    Time 12   Period Weeks   Status Achieved      PT LONG TERM GOAL #6   Title Pt will maintain 4/5 BLE strength in hip ext, flexion, knee flexion/ext, hip abduction B across 1 month in order to transition safely to community Silver Sneaker classes    Baseline 10/3: 4/5 gross strength with knee flexion and ankles 4-/5   Time 12   Period Weeks   Status On-going  Plan - 02/21/17 0902    Clinical Impression Statement Patient presents with progressive strength and improved capacity for functional activities. Performance of dynamic balance on airex pad improved with patient performing  Marching on dynamic surface for first time with only two episodes of LOB. Patient will continue to benefit from skilled physical therapy to allow for safe mobility in natural environment.   Rehab Potential Fair   Clinical Impairments Affecting Rehab Potential chronic back issues, age, pain, memory deficits   PT Frequency 1x / week   PT Duration 12 weeks   PT Treatment/Interventions ADLs/Self Care Home Management;Aquatic Therapy;Cryotherapy;Canalith Repostioning;Electrical Stimulation;Iontophoresis /ml Dexamethasone;Moist Heat;Ultrasound;DME Instruction;Gait training;Stair training;Functional mobility training;Therapeutic activities;Patient/family education;Balance training;Therapeutic exercise;Neuromuscular re-education;Manual techniques;Vestibular   PT Next Visit Plan turning around cones and corners   PT Home Exercise Plan Single knee to chest stretch, seated hamstring stretch   Consulted and Agree with Plan of Care Patient      Patient will benefit from skilled therapeutic intervention in order to improve the following deficits and impairments:  Pain, Decreased balance, Postural dysfunction, Difficulty walking, Abnormal gait, Decreased strength  Visit Diagnosis: Weakness  Difficulty walking  Lumbar radiculitis     Problem List Patient Active Problem List   Diagnosis Date Noted  . Cephalalgia 07/24/2015  . Neuritis or  radiculitis due to rupture of lumbar intervertebral disc 01/05/2015  . Degeneration of intervertebral disc of cervical region 04/14/2014  . B12 deficiency 12/23/2013  . Benign essential HTN 12/23/2013  . Type 2 diabetes mellitus (HCC) 12/23/2013  . Obstructive apnea 12/23/2013  . Lumbar canal stenosis 09/18/2013   Precious Bard, PT, DPT   Precious Bard 02/21/2017, 9:35 AM  [place] MAIN [facility] SERVICES [address] [place], [place], [address] Phone: [phone]   Fax:  [phone]  Name: Loretta Webb MRN: [id number] Date of Birth: 1931/06/30

## 2017-02-23 ENCOUNTER — Ambulatory Visit: Payer: Medicare Other | Admitting: Physical Therapy

## 2017-02-23 DIAGNOSIS — M7062 Trochanteric bursitis, left hip: Secondary | ICD-10-CM

## 2017-02-23 DIAGNOSIS — R531 Weakness: Secondary | ICD-10-CM | POA: Diagnosis not present

## 2017-02-23 DIAGNOSIS — M5416 Radiculopathy, lumbar region: Secondary | ICD-10-CM

## 2017-02-23 DIAGNOSIS — R262 Difficulty in walking, not elsewhere classified: Secondary | ICD-10-CM

## 2017-02-23 DIAGNOSIS — M25551 Pain in right hip: Secondary | ICD-10-CM

## 2017-02-23 NOTE — Patient Instructions (Addendum)
Open book ( laying on L side  Pillow behind back and between knees:  Palms together and roll 3/4 turn to the R to relax midback muscles    10 reps each morning    ________   Deep core level exercise 6 min    ________ Wall squats with hands/ shoulders  Pressed by wall   10 reps   Knees behind toes

## 2017-02-24 NOTE — Therapy (Signed)
[place] [facility] MAIN [facility] SERVICES [address] [place], [place], [address] Phone: [phone]   Fax:  [phone]  Physical Therapy Treatment  Patient Details  Name: Loretta Webb MRN: [id number] Date of Birth: Apr 12, 1932 Referring Provider: Merri Ray  Encounter Date: 02/23/2017      PT End of Session - 02/23/17 1139    Visit Number 21   Date for PT Re-Evaluation 2017-03-30   Authorization Type G codes 09/14/2022   PT Start Time 1100   PT Stop Time 1145   PT Time Calculation (min) 45 min   Equipment Utilized During Treatment Gait belt   Activity Tolerance Patient tolerated treatment well   Behavior During Therapy [facility] for tasks assessed/performed      Past Medical History:  Diagnosis Date  . Arthritis   . Chronic fatigue   . DM2 (diabetes mellitus, type 2) (HCC)   . Esophageal stricture   . Fibromyalgia   . GERD (gastroesophageal reflux disease)   . Hypertension   . Lumbar stenosis   . Osteoporosis   . Sleep apnea     Past Surgical History:  Procedure Laterality Date  . ABDOMINAL HYSTERECTOMY    . APPENDECTOMY    . knee replacements    . ROTATOR CUFF REPAIR      There were no vitals filed for this visit.      Subjective Assessment - 02/23/17 1132    Subjective Pt reports her legs are achey when she wakes up in the morning but it gets better once she is up and moving around   Pertinent History Pt reports bilateral LE pain primarily in the back of the thighs. Pt reports that this has been occurring for a couple months. She denies any similar problems in the past. Pt reports sudden onset but no known trauma or change in activity. Pt has a history of a herniated disc in her low back but currently pt is not complaining of any back pain. Per medical record pt has a history of spinal stenosis. Lumbar MRI results reviewed and confirm moderate to severe spinal stenosis at L2-L3. Pain also occurs sometimes in her buttocks. She has a  history of bilateral hip bursitis which is mild in pain currently. She has had remote injections for her bursitis with relief. She describes the pain in her thighs as aching and throbbing. Easing factors: ibuprofen, hydrocodone, voltaren gel, sitting down, hasn't tried ice/heat or massage: Aggravating factors: unknown; Denies numbness/tingling. Denies bruising or swelling. Denies chills, fever, night sweats. Pt reports over the last two weeks worsening of bladder control but she is very unclear about this and it is not clear if it is accurate. Pain rarely wakes patient up at night sometimes. She does not note a difference in pain from AM to PM. Reports that the pain comes on suddenly and resolves as soon as she is able to use her voltaren gel and take pain meds. nable to notice change. Occurs approximately once/day. Worst: 9/10, Best: 0/10, Present 0/10; Pt reports she has been having frequent falls as well and has had one in January, May, and June (10/12/16). No injuries reported during falls. Two of the falls occurred when she has looked to the left and lost her balance. ROS negative for red flags with the exception of possible problems with bladder control but again this is nonspecific and poorly communicated.    Diagnostic tests Lumbar MRI: multilevel degenerative changes, of note L2-L3 moderate to severe spinal stenosis  Patient Stated Goals Resolve pain in her legs, "know why I'm falling"            [facility] PT Assessment - 02/23/17 1134      Coordination   Gross Motor Movements are Fluid and Coordinated --  limited ribcage depression     Palpation   Spinal mobility increased R interspinal mm / iliocostalis mm tightness on R by convex curve                     Pelvic Floor Special Questions - 02/24/17 1552    Diastasis Recti 3 fingers width below sternum but no fingers iwdth above and below umbilicus            [facility] Adult PT Treatment/Exercise - 02/24/17 1539      Exercises    Exercises --  see pt instructions      Manual Therapy   Manual therapy comments fascial releases over intercostals of lower ribs, STM with MWM along iliocostials mm on R                 PT Education - 02/23/17 1139    Education provided Yes   Education Details HEP   Person(s) Educated Patient   Methods Explanation;Demonstration;Tactile cues;Verbal cues;Handout   Comprehension Returned demonstration;Verbalized understanding             PT Long Term Goals - 02/08/17 0847      PT LONG TERM GOAL #1   Title  Pt will be independent with HEP in order to improve strength and balance in order to decrease fall risk, improve function at home, and decrease bilateral thigh pain.    Baseline HEP given/modified   Time 6   Period Weeks   Status On-going     PT LONG TERM GOAL #2   Title Pt will decrease mODI scoreby at least 13 points in order demonstrate clinically significant reduction in pain/disability    Baseline 10/26/16: 42% 10/3: 26%   Time 6   Period Weeks   Status On-going     PT LONG TERM GOAL #3   Title Pt will report worst bilateral thigh pain as 6/10 in order to demonstrate clinically significant reduction in her pain   Baseline 10/26/16: worst: 9/10,  8/9: 6-7/10 10/3: no pain   Time 6   Period Weeks   Status Achieved     PT LONG TERM GOAL #4   Title Pt will demo no pelvic obliquities and increased L SIJ mobility across 2 visits in order to improve gait    Time 12   Period Weeks   Status Achieved     PT LONG TERM GOAL #5   Title Pt will report no radiating pain during sleep and upon waking across 5 days in order to improve quality of sleep   Baseline reports no pain    Time 12   Period Weeks   Status Achieved     PT LONG TERM GOAL #6   Title Pt will maintain 4/5 BLE strength in hip ext, flexion, knee flexion/ext, hip abduction B across 1 month in order to transition safely to community Silver [employer]ne[employer] classes    Baseline 10/3: 4/5 gross strength with  knee flexion and ankles 4-/5   Time 12   Period Weeks   Status On-going               Plan - 02/24/17 1553    Clinical Impression Statement Pt is making progress as  pt showed improved postural stability, less forward lean, and less antalgic gait. Pt 's R low back and deep core mm increased in strength as well. Pt's diastasis recti recti has also improved which has contributed to her improved postural stability. Applied manual Tx to address mm imbalance along thoracic spine in order to continue promoting ribcage depression and upright posture. Advanced pt's HEP with wall mini-squat with cues for isometric strengthening of posterior back mm for upright posture. Pt continues to benefit from skilled PT.     Rehab Potential Fair   Clinical Impairments Affecting Rehab Potential chronic back issues, age, pain, memory deficits   PT Frequency 1x / week   PT Duration 12 weeks   PT Treatment/Interventions ADLs/Self Care Home Management;Aquatic Therapy;Cryotherapy;Canalith Repostioning;Electrical Stimulation;Iontophoresis 4mg /ml Dexamethasone;Moist Heat;Ultrasound;DME Instruction;Gait training;Stair training;Functional mobility training;Therapeutic activities;Patient/family education;Balance training;Therapeutic exercise;Neuromuscular re-education;Manual techniques;Vestibular   PT Next Visit Plan turning around cones and corners   PT Home Exercise Plan Single knee to chest stretch, seated hamstring stretch   Consulted and Agree with Plan of Care Patient      Patient will benefit from skilled therapeutic intervention in order to improve the following deficits and impairments:  Pain, Decreased balance, Postural dysfunction, Difficulty walking, Abnormal gait, Decreased strength  Visit Diagnosis: Weakness  Difficulty walking  Lumbar radiculitis  Trochanteric bursitis of left hip  Right hip pain     Problem List Patient Active Problem List   Diagnosis Date Noted  . Cephalalgia 07/24/2015   . Neuritis or radiculitis due to rupture of lumbar intervertebral disc 01/05/2015  . Degeneration of intervertebral disc of cervical region 04/14/2014  . B12 deficiency 12/23/2013  . Benign essential HTN 12/23/2013  . Type 2 diabetes mellitus (HCC) 12/23/2013  . Obstructive apnea 12/23/2013  . Lumbar canal stenosis 09/18/2013    Mariane MastersYeung,Shin Yiing ,PT, DPT, E-RYT  02/24/2017, 3:58 PM  [place] [facility] [address] [place], [place], [address] Phone: [phone]   Fax:  [phone]  Name: Angelica ChessmanRichie S Constable MRN: [id number] Date of Birth: July 08, 1931

## 2017-02-24 NOTE — Patient Instructions (Signed)
See routine

## 2017-02-24 NOTE — Therapy (Addendum)
[place] [facility] MAIN [facility] SERVICES [address] [place], [place], [address] Phone: [phone]   Fax:  [phone]  Physical Therapy Treatment  Patient Details  Name: Loretta Webb MRN: [id number] Date of Birth: [birth year] Referring Provider: Merri Ray  Encounter Date: 02/02/2017   Past Medical History:  Diagnosis Date  . Arthritis   . Chronic fatigue   . DM2 (diabetes mellitus, type 2) (HCC)   . Esophageal stricture   . Fibromyalgia   . GERD (gastroesophageal reflux disease)   . Hypertension   . Lumbar stenosis   . Osteoporosis   . Sleep apnea     Past Surgical History:  Procedure Laterality Date  . ABDOMINAL HYSTERECTOMY    . APPENDECTOMY    . knee replacements    . ROTATOR CUFF REPAIR      There were no vitals filed for this visit.      Subjective Assessment - 02/02/17 1132    Subjective Pt reports no complaints with HEP from last session   Pertinent History Pt reports bilateral LE pain primarily in the back of the thighs. Pt reports that this has been occurring for a couple months. She denies any similar problems in the past. Pt reports sudden onset but no known trauma or change in activity. Pt has a history of a herniated disc in her low back but currently pt is not complaining of any back pain. Per medical record pt has a history of spinal stenosis. Lumbar MRI results reviewed and confirm moderate to severe spinal stenosis at L2-L3. Pain also occurs sometimes in her buttocks. She has a history of bilateral hip bursitis which is mild in pain currently. She has had remote injections for her bursitis with relief. She describes the pain in her thighs as aching and throbbing. Easing factors: ibuprofen, hydrocodone, voltaren gel, sitting down, hasn't tried ice/heat or massage: Aggravating factors: unknown; Denies numbness/tingling. Denies bruising or swelling. Denies chills, fever, night sweats. Pt reports over the last two weeks  worsening of bladder control but she is very unclear about this and it is not clear if it is accurate. Pain rarely wakes patient up at night sometimes. She does not note a difference in pain from AM to PM. Reports that the pain comes on suddenly and resolves as soon as she is able to use her voltaren gel and take pain meds. nable to notice change. Occurs approximately once/day. Worst: 9/10, Best: 0/10, Present 0/10; Pt reports she has been having frequent falls as well and has had one in January, May, and June (10/12/16). No injuries reported during falls. Two of the falls occurred when she has looked to the left and lost her balance. ROS negative for red flags with the exception of possible problems with bladder control but again this is nonspecific and poorly communicated.    Diagnostic tests Lumbar MRI: multilevel degenerative changes, of note L2-L3 moderate to severe spinal stenosis   Patient Stated Goals Resolve pain in her legs, "know why I'm falling"             [facility] Adult PT Treatment/Exercise - 02/24/17 1539      Exercises   Exercises --  see pt instructions   Reveiwed Deep core level 3 .   toe tap behind--> lift foot off floor  10 reps each side   Calf stretch by counter, knee straight and knee bent  5 breaths   Doorway:  Forward stepping lunges 10 reps toe tap behind--> lift  foot off floor  10 reps each side   Calf stretch by counter, knee straight and knee bent  5 breaths   Doorway:  Forward stepping lunges 10 reps        Assessment: pt showed good carry over with new HEP        PT Education - 02/23/17 1139    Education provided Yes   Education Details HEP   Person(s) Educated Patient   Methods Explanation;Demonstration;Tactile cues;Verbal cues;Handout   Comprehension Returned demonstration;Verbalized understanding             PT Long Term Goals - 02/08/17 0847      PT LONG TERM GOAL #1   Title  Pt will be independent with HEP in order to improve  strength and balance in order to decrease fall risk, improve function at home, and decrease bilateral thigh pain.    Baseline HEP given/modified   Time 6   Period Weeks   Status On-going     PT LONG TERM GOAL #2   Title Pt will decrease mODI scoreby at least 13 points in order demonstrate clinically significant reduction in pain/disability    Baseline 10/26/16: 42% 10/3: 26%   Time 6   Period Weeks   Status On-going     PT LONG TERM GOAL #3   Title Pt will report worst bilateral thigh pain as 6/10 in order to demonstrate clinically significant reduction in her pain   Baseline 10/26/16: worst: 9/10,  8/9: 6-7/10 10/3: no pain   Time 6   Period Weeks   Status Achieved     PT LONG TERM GOAL #4   Title Pt will demo no pelvic obliquities and increased L SIJ mobility across 2 visits in order to improve gait    Time 12   Period Weeks   Status Achieved     PT LONG TERM GOAL #5   Title Pt will report no radiating pain during sleep and upon waking across 5 days in order to improve quality of sleep   Baseline reports no pain    Time 12   Period Weeks   Status Achieved     PT LONG TERM GOAL #6   Title Pt will maintain 4/5 BLE strength in hip ext, flexion, knee flexion/ext, hip abduction B across 1 month in order to transition safely to community Silver [employer]ne[employer] classes    Baseline 10/3: 4/5 gross strength with knee flexion and ankles 4-/5   Time 12   Period Weeks   Status On-going               Plan - 02/24/17 1553    Clinical Impression Statement Pt is making good progress with deep core strengthening to address her diastasis recti. Pt continues to show good carry over from previous HEP.  Pt continues to benefit from skilled PT.     Rehab Potential Fair   Clinical Impairments Affecting Rehab Potential chronic back issues, age, pain, memory deficits   PT Frequency 1x / week   PT Duration 12 weeks   PT Treatment/Interventions ADLs/Self Care Home Management;Aquatic  Therapy;Cryotherapy;Canalith Repostioning;Electrical Stimulation;Iontophoresis 4mg /ml Dexamethasone;Moist Heat;Ultrasound;DME Instruction;Gait training;Stair training;Functional mobility training;Therapeutic activities;Patient/family education;Balance training;Therapeutic exercise;Neuromuscular re-education;Manual techniques;Vestibular   PT Next Visit Plan turning around cones and corners   PT Home Exercise Plan Single knee to chest stretch, seated hamstring stretch   Consulted and Agree with Plan of Care Patient      Patient will benefit from skilled therapeutic intervention in order to improve  the following deficits and impairments:     Visit Diagnosis: Weakness  Lumbar radiculitis  Difficulty walking  Trochanteric bursitis of left hip  Right hip pain     Problem List Patient Active Problem List   Diagnosis Date Noted  . Cephalalgia 07/24/2015  . Neuritis or radiculitis due to rupture of lumbar intervertebral disc 01/05/2015  . Degeneration of intervertebral disc of cervical region 04/14/2014  . B12 deficiency 12/23/2013  . Benign essential HTN 12/23/2013  . Type 2 diabetes mellitus (HCC) 12/23/2013  . Obstructive apnea 12/23/2013  . Lumbar canal stenosis 09/18/2013    Mariane MastersYeung,Shin Yiing ,PT, DPT, E-RYT  02/24/2017, 4:52 PM  [place] [facility] [address] [place], [place], [address] Phone: [phone]   Fax:  [phone]  Name: Loretta Webb MRN: [id number] Date of Birth: 29-Apr-1932

## 2017-02-28 ENCOUNTER — Ambulatory Visit: Payer: Medicare Other

## 2017-03-03 ENCOUNTER — Ambulatory Visit: Payer: Medicare Other

## 2017-03-03 DIAGNOSIS — R531 Weakness: Secondary | ICD-10-CM | POA: Diagnosis not present

## 2017-03-03 DIAGNOSIS — M5416 Radiculopathy, lumbar region: Secondary | ICD-10-CM

## 2017-03-03 DIAGNOSIS — R262 Difficulty in walking, not elsewhere classified: Secondary | ICD-10-CM

## 2017-03-03 DIAGNOSIS — M7062 Trochanteric bursitis, left hip: Secondary | ICD-10-CM

## 2017-03-03 NOTE — Therapy (Signed)
[facility] [address] [place], [place], [address] Phone: [phone]   Fax:  [phone]  Physical Therapy Treatment  Patient Details  Name: Loretta ChessmanRichie S Moder MRN: [id number] Date of Birth: 09/08/1931 Referring Provider: Merri RayBenjamin Chasnis  Encounter Date: 03/03/2017      PT End of Session - 03/03/17 1052    Visit Number 22   Number of Visits 34   Date for PT Re-Evaluation 04/14/17   Authorization Type G codes 5/10 (next will be 1/10)   PT Start Time 1000   PT Stop Time 1046   PT Time Calculation (min) 46 min   Equipment Utilized During Treatment Gait belt   Activity Tolerance Patient tolerated treatment well   Behavior During Therapy WFL for tasks assessed/performed      Past Medical History:  Diagnosis Date  . Arthritis   . Chronic fatigue   . DM2 (diabetes mellitus, type 2) (HCC)   . Esophageal stricture   . Fibromyalgia   . GERD (gastroesophageal reflux disease)   . Hypertension   . Lumbar stenosis   . Osteoporosis   . Sleep apnea     Past Surgical History:  Procedure Laterality Date  . ABDOMINAL HYSTERECTOMY    . APPENDECTOMY    . knee replacements    . ROTATOR CUFF REPAIR      There were no vitals filed for this visit.      Subjective Assessment - 03/03/17 1002    Subjective Patient had difficulty at the dealership earlier this week when she had to sit on a stool causing her legs to feel like the circulation was cut off. Took two days to recover. Slightly sore this morning but has decreased to nothing now.    Pertinent History Pt reports bilateral LE pain primarily in the back of the thighs. Pt reports that this has been occurring for a couple months. She denies any similar problems in the past. Pt reports sudden onset but no known trauma or change in activity. Pt has a history of a herniated disc in her low back but currently pt is not complaining of any back pain. Per medical record pt has a  history of spinal stenosis. Lumbar MRI results reviewed and confirm moderate to severe spinal stenosis at L2-L3. Pain also occurs sometimes in her buttocks. She has a history of bilateral hip bursitis which is mild in pain currently. She has had remote injections for her bursitis with relief. She describes the pain in her thighs as aching and throbbing. Easing factors: ibuprofen, hydrocodone, voltaren gel, sitting down, hasn't tried ice/heat or massage: Aggravating factors: unknown; Denies numbness/tingling. Denies bruising or swelling. Denies chills, fever, night sweats. Pt reports over the last two weeks worsening of bladder control but she is very unclear about this and it is not clear if it is accurate. Pain rarely wakes patient up at night sometimes. She does not note a difference in pain from AM to PM. Reports that the pain comes on suddenly and resolves as soon as she is able to use her voltaren gel and take pain meds. nable to notice change. Occurs approximately once/day. Worst: 9/10, Best: 0/10, Present 0/10; Pt reports she has been having frequent falls as well and has had one in January, May, and June (10/12/16). No injuries reported during falls. Two of the falls occurred when she has looked to the left and lost her balance. ROS negative for red flags with the exception of possible problems  with bladder control but again this is nonspecific and poorly communicated.    Diagnostic tests Lumbar MRI: multilevel degenerative changes, of note L2-L3 moderate to severe spinal stenosis   Patient Stated Goals Resolve pain in her legs, "know why I'm falling"   Currently in Pain? No/denies      Not comfortable stepping off curbs and on cement yet, difficulty with slopes, more difficult going down.     MODI: 22% MMT  Right Left  Hip flexion 4-/5 4+/5  Hip Abduction 4-/5 4+/5  Hip Adduction 4-/5 4/5  Knee Extension  4/5 4+/5  Knee Flexion 4/5 4+/5  DF 4/5 4/5  PF 4/5 4/5   BERG: 42/56       [facility]  PT Assessment - 03/03/17 0001      Standardized Balance Assessment   Standardized Balance Assessment Berg Balance Test     Berg Balance Test   Sit to Stand Able to stand without using hands and stabilize independently   Standing Unsupported Able to stand safely 2 minutes   Sitting with Back Unsupported but Feet Supported on Floor or Stool Able to sit safely and securely 2 minutes   Stand to Sit Sits safely with minimal use of hands   Transfers Able to transfer safely, minor use of hands   Standing Unsupported with Eyes Closed Able to stand 3 seconds   Standing Ubsupported with Feet Together Able to place feet together independently and stand for 1 minute with supervision   From Standing, Reach Forward with Outstretched Arm Can reach forward >12 cm safely (5")   From Standing Position, Pick up Object from Floor Able to pick up shoe, needs supervision   From Standing Position, Turn to Look Behind Over each Shoulder Looks behind from both sides and weight shifts well   Turn 360 Degrees Able to turn 360 degrees safely but slowly   Standing Unsupported, Alternately Place Feet on Step/Stool Able to complete 4 steps without aid or supervision   Standing Unsupported, One Foot in Front Able to take small step independently and hold 30 seconds   Standing on One Leg Tries to lift leg/unable to hold 3 seconds but remains standing independently   Total Score 42      VAS: 0/10 now , 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 Airex pad: ball toss 30x with PT, cues for not relying upon posterior bar for  support.   Pt. response to medical necessity: . Patient will continue to benefit from skilled physical therapy to allow for safe mobility in natural environment.                     PT Education - 03/03/17 1003    Education provided Yes   Education Details POC   Person(s) Educated Patient   Methods Explanation   Comprehension Verbalized understanding          PT Short Term Goals - 03/03/17 1055      PT SHORT TERM GOAL #1  Title Patient will tolerate 5 seconds of single leg stance without loss of balance to improve ability to get in and out of shower safely.   Baseline 10/26: patient unable to perform SLS    Time 2   Period Weeks   Status New   Target Date 03/17/17     PT SHORT TERM GOAL #2   Title Patient will be independent in bending down towards floor and picking up small object (<5 pounds) and then stand back up without loss of balance as to improve ability to pick up and clean up room at home   Baseline Pt requires supervision when picking objects off of floor   Time 2   Period Weeks   Status New   Target Date 03/17/17           PT Long Term Goals - 03/03/17 1033      PT LONG TERM GOAL #1   Title  Pt will be independent with HEP in order to improve strength and balance in order to decrease fall risk, improve function at home, and decrease bilateral thigh pain.    Baseline HEP given/modified   Time 6   Period Weeks   Status On-going     PT LONG TERM GOAL #2   Title Pt will decrease mODI scoreby at least 13 points in order demonstrate clinically significant reduction in pain/disability    Baseline 10/26/16: 42% 10/3: 26% 10/26: 22%   Time 6   Period Weeks   Status Achieved     PT LONG TERM GOAL #3   Title Pt will report worst bilateral thigh pain as 6/10 in order to demonstrate clinically significant reduction in her pain   Baseline 10/26/16: worst: 9/10,  8/9: 6-7/10 10/3: no pain   Time 6   Period Weeks   Status Achieved     PT  LONG TERM GOAL #4   Title Pt will demo no pelvic obliquities and increased L SIJ mobility across 2 visits in order to improve gait    Time 12   Period Weeks   Status Achieved     PT LONG TERM GOAL #5   Title Pt will report no radiating pain during sleep and upon waking across 5 days in order to improve quality of sleep   Baseline reports no pain    Time 12   Period Weeks   Status Achieved     Additional Long Term Goals   Additional Long Term Goals Yes     PT LONG TERM GOAL #6   Title Pt will maintain 4/5 BLE strength in hip ext, flexion, knee flexion/ext, hip abduction B across 1 month in order to transition safely to community Silver Sneaker classes    Baseline 10/3: 4/5 gross strength with knee flexion and ankles 4-/5 10/ 10/26: R 4-/5 with L 4+/5   Time 6   Period Weeks   Status On-going     PT LONG TERM GOAL #7   Title Patient will increase Berg Balance score by > 6 points to demonstrate decreased fall risk during functional activities   Baseline 10/26: 42/56   Time 6   Period Weeks   Status New   Target Date 04/14/17               Plan - 03/03/17 1054    Clinical Impression Statement Patient progressing towards goals at this time. Strength of RLE is weaker than LLE when compared in seated manual test. Balance requiring single limb support  is challenging to patient, including tasks that require RLE to bear majority of patient's weight. BERG= 42/56 due to above reasoning. MODI=22%. Patient will continue to benefit from skilled physical therapy to allow for safe mobility in natural environment.   Rehab Potential Fair   Clinical Impairments Affecting Rehab Potential chronic back issues, age, pain, memory deficits   PT Frequency 1x / week   PT Duration 12 weeks   PT Treatment/Interventions ADLs/Self Care Home Management;Aquatic Therapy;Cryotherapy;Canalith Repostioning;Electrical Stimulation;Iontophoresis 4mg /ml Dexamethasone;Moist Heat;Ultrasound;DME Instruction;Gait  training;Stair training;Functional mobility training;Therapeutic activities;Patient/family education;Balance training;Therapeutic exercise;Neuromuscular re-education;Manual techniques;Vestibular   PT Next Visit Plan turning around cones and corners   PT Home Exercise Plan Single knee to chest stretch, seated hamstring stretch   Consulted and Agree with Plan of Care Patient      Patient will benefit from skilled therapeutic intervention in order to improve the following deficits and impairments:  Pain, Decreased balance, Postural dysfunction, Difficulty walking, Abnormal gait, Decreased strength  Visit Diagnosis: Weakness  Difficulty walking  Lumbar radiculitis  Trochanteric bursitis of left hip       G-Codes - Apr 01, 2017 1056    Functional Assessment Tool Used (Outpatient Only) MMT, MODI, clinical judgement, VAS, BERG   Functional Limitation Mobility: Walking and moving around   Mobility: Walking and Moving Around Current Status [phone]) At least 20 percent but less than 40 percent impaired, limited or restricted   Mobility: Walking and Moving Around Goal Status [phone]) At least 1 percent but less than 20 percent impaired, limited or restricted      Problem List Patient Active Problem List   Diagnosis Date Noted  . Cephalalgia 07/24/2015  . Neuritis or radiculitis due to rupture of lumbar intervertebral disc 01/05/2015  . Degeneration of intervertebral disc of cervical region 04/14/2014  . B12 deficiency 12/23/2013  . Benign essential HTN 12/23/2013  . Type 2 diabetes mellitus (HCC) 12/23/2013  . Obstructive apnea 12/23/2013  . Lumbar canal stenosis 09/18/2013  Precious Bard, PT, DPT    Precious Bard 2017/04/01, 10:57 AM  [facility] MAIN [facility] SERVICES [address] [place], [place], [address] Phone: [phone]   Fax:  [phone]  Name: CHAVON LUCARELLI MRN: [id number] Date of Birth: 1932-04-29

## 2017-03-07 ENCOUNTER — Ambulatory Visit: Payer: Medicare Other

## 2017-03-07 DIAGNOSIS — R531 Weakness: Secondary | ICD-10-CM

## 2017-03-07 DIAGNOSIS — M5416 Radiculopathy, lumbar region: Secondary | ICD-10-CM

## 2017-03-07 DIAGNOSIS — R262 Difficulty in walking, not elsewhere classified: Secondary | ICD-10-CM

## 2017-03-07 NOTE — Therapy (Signed)
[facility] MAIN [facility] SERVICES [address] [place], [place], [address] Phone: [phone]   Fax:  [phone]  Physical Therapy Treatment  Patient Details  Name: Loretta Webb MRN: [id number] Date of Birth: 1931-05-11 Referring Provider: Merri Ray  Encounter Date: 03/07/2017      PT End of Session - 03/07/17 0904    Visit Number 23   Number of Visits 34   Date for PT Re-Evaluation 04/14/17   Authorization Type 1/10   PT Start Time 0900   PT Stop Time 0945   PT Time Calculation (min) 45 min   Equipment Utilized During Treatment Gait belt   Activity Tolerance Patient tolerated treatment well   Behavior During Therapy [facility] for tasks assessed/performed      Past Medical History:  Diagnosis Date  . Arthritis   . Chronic fatigue   . DM2 (diabetes mellitus, type 2) (HCC)   . Esophageal stricture   . Fibromyalgia   . GERD (gastroesophageal reflux disease)   . Hypertension   . Lumbar stenosis   . Osteoporosis   . Sleep apnea     Past Surgical History:  Procedure Laterality Date  . ABDOMINAL HYSTERECTOMY    . APPENDECTOMY    . knee replacements    . ROTATOR CUFF REPAIR      There were no vitals filed for this visit.      Subjective Assessment - 03/07/17 0902    Subjective Patient feeling fatigued this morning. Reports she has been walking since last session, no concerns or questions at this time.    Pertinent History Pt reports bilateral LE pain primarily in the back of the thighs. Pt reports that this has been occurring for a couple months. She denies any similar problems in the past. Pt reports sudden onset but no known trauma or change in activity. Pt has a history of a herniated disc in her low back but currently pt is not complaining of any back pain. Per medical record pt has a history of spinal stenosis. Lumbar MRI results reviewed and confirm moderate to severe spinal stenosis at L2-L3. Pain also occurs sometimes  in her buttocks. She has a history of bilateral hip bursitis which is mild in pain currently. She has had remote injections for her bursitis with relief. She describes the pain in her thighs as aching and throbbing. Easing factors: ibuprofen, hydrocodone, voltaren gel, sitting down, hasn't tried ice/heat or massage: Aggravating factors: unknown; Denies numbness/tingling. Denies bruising or swelling. Denies chills, fever, night sweats. Pt reports over the last two weeks worsening of bladder control but she is very unclear about this and it is not clear if it is accurate. Pain rarely wakes patient up at night sometimes. She does not note a difference in pain from AM to PM. Reports that the pain comes on suddenly and resolves as soon as she is able to use her voltaren gel and take pain meds. nable to notice change. Occurs approximately once/day. Worst: 9/10, Best: 0/10, Present 0/10; Pt reports she has been having frequent falls as well and has had one in January, May, and June (10/12/16). No injuries reported during falls. Two of the falls occurred when she has looked to the left and lost her balance. ROS negative for red flags with the exception of possible problems with bladder control but again this is nonspecific and poorly communicated.    Diagnostic tests Lumbar MRI: multilevel degenerative changes, of note L2-L3 moderate to severe spinal stenosis  Patient Stated Goals Resolve pain in her legs, "know why I'm falling"   Currently in Pain? No/denies      Nustep Lvl 4 4 minutes (no charge)   TherEx: Sit to stands 5lb bar overhead raises 2x10; improved body mechanics; occasional verbal cueing for sequencing of UE raise.   Weaving between cones , CGA, 6x 2 episodes of LOB when turning to left, tripping over own feet, excessive knee bend when fearful of turning.    Quantum Leg press 75lb 2x15: cues for body  Mechanics, not allowing machine to slam down at end range-control eccentric movement;    Quantum  leg press, single limb press 45 lb 2x12 each leg    6" step:             toe taps 20x with finger tip support  Forward step ups 20x SUE support             Lateral step ups 20x each leg; UE support.      Neuro Re-ed Airex: static balance tossing ball back and forth with PT 30 tosses no LOB  Cone taps 3 cones with PT directing cone and color with decreasing UE support x 3 minutes   Soccer ball kicks 10x each leg  in // bars, requires finger tip support     Pt. response to medical necessity: . Patient will continue to benefit from skilled physical therapy to allow for safe mobility in natural environment.                          PT Education - 03/07/17 0903    Education provided Yes   Education Details POC, patient confused on timeline.    Person(s) Educated Patient   Methods Explanation;Demonstration;Verbal cues   Comprehension Verbalized understanding;Returned demonstration          PT Short Term Goals - 03/03/17 1055      PT SHORT TERM GOAL #1   Title Patient will tolerate 5 seconds of single leg stance without loss of balance to improve ability to get in and out of shower safely.   Baseline 10/26: patient unable to perform SLS    Time 2   Period Weeks   Status New   Target Date 03/17/17     PT SHORT TERM GOAL #2   Title Patient will be independent in bending down towards floor and picking up small object (<5 pounds) and then stand back up without loss of balance as to improve ability to pick up and clean up room at home   Baseline Pt requires supervision when picking objects off of floor   Time 2   Period Weeks   Status New   Target Date 03/17/17           PT Long Term Goals - 03/03/17 1033      PT LONG TERM GOAL #1   Title  Pt will be independent with HEP in order to improve strength and balance in order to decrease fall risk, improve function at home, and decrease bilateral thigh pain.    Baseline HEP given/modified   Time 6    Period Weeks   Status On-going     PT LONG TERM GOAL #2   Title Pt will decrease mODI scoreby at least 13 points in order demonstrate clinically significant reduction in pain/disability    Baseline 10/26/16: 42% 10/3: 26% 10/26: 22%   Time 6   Period Weeks   Status  Achieved     PT LONG TERM GOAL #3   Title Pt will report worst bilateral thigh pain as 6/10 in order to demonstrate clinically significant reduction in her pain   Baseline 10/26/16: worst: 9/10,  8/9: 6-7/10 10/3: no pain   Time 6   Period Weeks   Status Achieved     PT LONG TERM GOAL #4   Title Pt will demo no pelvic obliquities and increased L SIJ mobility across 2 visits in order to improve gait    Time 12   Period Weeks   Status Achieved     PT LONG TERM GOAL #5   Title Pt will report no radiating pain during sleep and upon waking across 5 days in order to improve quality of sleep   Baseline reports no pain    Time 12   Period Weeks   Status Achieved     Additional Long Term Goals   Additional Long Term Goals Yes     PT LONG TERM GOAL #6   Title Pt will maintain 4/5 BLE strength in hip ext, flexion, knee flexion/ext, hip abduction B across 1 month in order to transition safely to community Silver Sneaker classes    Baseline 10/3: 4/5 gross strength with knee flexion and ankles 4-/5 10/ 10/26: R 4-/5 with L 4+/5   Time 6   Period Weeks   Status On-going     PT LONG TERM GOAL #7   Title Patient will increase Berg Balance score by > 6 points to demonstrate decreased fall risk during functional activities   Baseline 10/26: 42/56   Time 6   Period Weeks   Status New   Target Date 04/14/17               Plan - 03/07/17 1610    Clinical Impression Statement  Patient progressing with functional strength and balance. Single limb balance challenges patient at this time and requires finger tip support. Limited balance without bilateral LE support. Patient will continue to benefit from skilled physical  therapy to allow for safe mobility in natural environment.   Rehab Potential Fair   Clinical Impairments Affecting Rehab Potential chronic back issues, age, pain, memory deficits   PT Frequency 1x / week   PT Duration 12 weeks   PT Treatment/Interventions ADLs/Self Care Home Management;Aquatic Therapy;Cryotherapy;Canalith Repostioning;Electrical Stimulation;Iontophoresis 4mg /ml Dexamethasone;Moist Heat;Ultrasound;DME Instruction;Gait training;Stair training;Functional mobility training;Therapeutic activities;Patient/family education;Balance training;Therapeutic exercise;Neuromuscular re-education;Manual techniques;Vestibular   PT Next Visit Plan turning around cones and corners   PT Home Exercise Plan Single knee to chest stretch, seated hamstring stretch   Consulted and Agree with Plan of Care Patient      Patient will benefit from skilled therapeutic intervention in order to improve the following deficits and impairments:  Pain, Decreased balance, Postural dysfunction, Difficulty walking, Abnormal gait, Decreased strength  Visit Diagnosis: Weakness  Difficulty walking  Lumbar radiculitis     Problem List Patient Active Problem List   Diagnosis Date Noted  . Cephalalgia 07/24/2015  . Neuritis or radiculitis due to rupture of lumbar intervertebral disc 01/05/2015  . Degeneration of intervertebral disc of cervical region 04/14/2014  . B12 deficiency 12/23/2013  . Benign essential HTN 12/23/2013  . Type 2 diabetes mellitus (HCC) 12/23/2013  . Obstructive apnea 12/23/2013  . Lumbar canal stenosis 09/18/2013   Precious Bard, PT, DPT   Precious Bard 03/07/2017, 9:45 AM  [place] [facility] MAIN [facility] SERVICES [address] [place], [place], [address] Phone: [phone]  Fax:  [phone]  Name: Angelica ChessmanRichie S Oberman MRN: [id number] Date of Birth: [birth year]

## 2017-03-09 ENCOUNTER — Ambulatory Visit: Payer: Medicare Other | Attending: Physical Medicine and Rehabilitation

## 2017-03-09 DIAGNOSIS — M7062 Trochanteric bursitis, left hip: Secondary | ICD-10-CM | POA: Insufficient documentation

## 2017-03-09 DIAGNOSIS — M5416 Radiculopathy, lumbar region: Secondary | ICD-10-CM | POA: Insufficient documentation

## 2017-03-09 DIAGNOSIS — R531 Weakness: Secondary | ICD-10-CM | POA: Diagnosis present

## 2017-03-09 DIAGNOSIS — R262 Difficulty in walking, not elsewhere classified: Secondary | ICD-10-CM

## 2017-03-09 NOTE — Therapy (Signed)
[place] [facility] MAIN [facility] SERVICES [address] [place], [place], [address] Phone: [phone]   Fax:  [phone]  Physical Therapy Treatment  Patient Details  Name: Loretta Webb MRN: [id number] Date of Birth: 1931/11/05 Referring Provider: Merri Ray  Encounter Date: 03/09/2017      PT End of Session - 03/09/17 0950    Visit Number 24   Number of Visits 34   Date for PT Re-Evaluation 04/14/17   Authorization Type 2/10   PT Start Time 0945   PT Stop Time 1030   PT Time Calculation (min) 45 min   Equipment Utilized During Treatment Gait belt   Activity Tolerance Patient tolerated treatment well   Behavior During Therapy [facility] for tasks assessed/performed      Past Medical History:  Diagnosis Date  . Arthritis   . Chronic fatigue   . DM2 (diabetes mellitus, type 2) (HCC)   . Esophageal stricture   . Fibromyalgia   . GERD (gastroesophageal reflux disease)   . Hypertension   . Lumbar stenosis   . Osteoporosis   . Sleep apnea     Past Surgical History:  Procedure Laterality Date  . ABDOMINAL HYSTERECTOMY    . APPENDECTOMY    . knee replacements    . ROTATOR CUFF REPAIR      There were no vitals filed for this visit.      Subjective Assessment - 03/09/17 0949    Subjective Patient reports feeling sore after last session. Compliant with HEP.    Pertinent History Pt reports bilateral LE pain primarily in the back of the thighs. Pt reports that this has been occurring for a couple months. She denies any similar problems in the past. Pt reports sudden onset but no known trauma or change in activity. Pt has a history of a herniated disc in her low back but currently pt is not complaining of any back pain. Per medical record pt has a history of spinal stenosis. Lumbar MRI results reviewed and confirm moderate to severe spinal stenosis at L2-L3. Pain also occurs sometimes in her buttocks. She has a history of bilateral hip bursitis  which is mild in pain currently. She has had remote injections for her bursitis with relief. She describes the pain in her thighs as aching and throbbing. Easing factors: ibuprofen, hydrocodone, voltaren gel, sitting down, hasn't tried ice/heat or massage: Aggravating factors: unknown; Denies numbness/tingling. Denies bruising or swelling. Denies chills, fever, night sweats. Pt reports over the last two weeks worsening of bladder control but she is very unclear about this and it is not clear if it is accurate. Pain rarely wakes patient up at night sometimes. She does not note a difference in pain from AM to PM. Reports that the pain comes on suddenly and resolves as soon as she is able to use her voltaren gel and take pain meds. nable to notice change. Occurs approximately once/day. Worst: 9/10, Best: 0/10, Present 0/10; Pt reports she has been having frequent falls as well and has had one in January, May, and June (10/12/16). No injuries reported during falls. Two of the falls occurred when she has looked to the left and lost her balance. ROS negative for red flags with the exception of possible problems with bladder control but again this is nonspecific and poorly communicated.    Diagnostic tests Lumbar MRI: multilevel degenerative changes, of note L2-L3 moderate to severe spinal stenosis   Patient Stated Goals Resolve pain in her legs, "  know why I'm falling"   Currently in Pain? No/denies     Nustep Lvl 4 4 minutes (no charge)    TherEx: Sit to stands 2.5lb bar overhead raises 2x12; improved body mechanics; occasional verbal cueing for sequencing of UE raise.     3lb ankle weights:  Hip flexion 12x each leg  Hip extension 12x each leg  Hamstring curls (buttkickers) 12x each leg  Hip abduction 12x each leg  Quantum Leg press 75lb 2x15: cues for body  Mechanics, not allowing machine to slam down at end range-control eccentric movement;    Quantum leg press, single limb press 45 lb 2x15 each leg     6" step:             toe taps 20x with finger tip support             Forward step ups 15x each side SUE support             Lateral step ups 20x each leg; UE support.      Neuro Re-ed Airex: static balance tossing ball back and forth with PT 30 tosses no LOB  Airex: marching 40 marches occasional cueing to get back onto mat due to forward progression.        Pt. response to medical necessity: . Patient will continue to benefit from skilled physical therapy to allow for safe mobility in natural environment.                             PT Education - 03/09/17 0949    Education provided Yes   Education Details strengthening and body mechanics   Person(s) Educated Patient   Methods Explanation;Demonstration;Verbal cues   Comprehension Verbalized understanding;Returned demonstration          PT Short Term Goals - 03/03/17 1055      PT SHORT TERM GOAL #1   Title Patient will tolerate 5 seconds of single leg stance without loss of balance to improve ability to get in and out of shower safely.   Baseline 10/26: patient unable to perform SLS    Time 2   Period Weeks   Status New   Target Date 03/17/17     PT SHORT TERM GOAL #2   Title Patient will be independent in bending down towards floor and picking up small object (<5 pounds) and then stand back up without loss of balance as to improve ability to pick up and clean up room at home   Baseline Pt requires supervision when picking objects off of floor   Time 2   Period Weeks   Status New   Target Date 03/17/17           PT Long Term Goals - 03/03/17 1033      PT LONG TERM GOAL #1   Title  Pt will be independent with HEP in order to improve strength and balance in order to decrease fall risk, improve function at home, and decrease bilateral thigh pain.    Baseline HEP given/modified   Time 6   Period Weeks   Status On-going     PT LONG TERM GOAL #2   Title Pt will decrease mODI scoreby at  least 13 points in order demonstrate clinically significant reduction in pain/disability    Baseline 10/26/16: 42% 10/3: 26% 10/26: 22%   Time 6   Period Weeks   Status Achieved     PT  LONG TERM GOAL #3   Title Pt will report worst bilateral thigh pain as 6/10 in order to demonstrate clinically significant reduction in her pain   Baseline 10/26/16: worst: 9/10,  8/9: 6-7/10 10/3: no pain   Time 6   Period Weeks   Status Achieved     PT LONG TERM GOAL #4   Title Pt will demo no pelvic obliquities and increased L SIJ mobility across 2 visits in order to improve gait    Time 12   Period Weeks   Status Achieved     PT LONG TERM GOAL #5   Title Pt will report no radiating pain during sleep and upon waking across 5 days in order to improve quality of sleep   Baseline reports no pain    Time 12   Period Weeks   Status Achieved     Additional Long Term Goals   Additional Long Term Goals Yes     PT LONG TERM GOAL #6   Title Pt will maintain 4/5 BLE strength in hip ext, flexion, knee flexion/ext, hip abduction B across 1 month in order to transition safely to community Silver Sneaker classes    Baseline 10/3: 4/5 gross strength with knee flexion and ankles 4-/5 10/ 10/26: R 4-/5 with L 4+/5   Time 6   Period Weeks   Status On-going     PT LONG TERM GOAL #7   Title Patient will increase Berg Balance score by > 6 points to demonstrate decreased fall risk during functional activities   Baseline 10/26: 42/56   Time 6   Period Weeks   Status New   Target Date 04/14/17               Plan - 03/09/17 1043    Clinical Impression Statement Patient progressing with strength and mobility. Leg press requires less frequent verbal cueing for activation and body mechanics. UE support occasionally required for stepping up mechanisms due to limited ability to retain single limb support. Patient will continue to benefit from skilled physical therapy to allow for safe mobility in natural  environment.   Rehab Potential Fair   Clinical Impairments Affecting Rehab Potential chronic back issues, age, pain, memory deficits   PT Frequency 1x / week   PT Duration 12 weeks   PT Treatment/Interventions ADLs/Self Care Home Management;Aquatic Therapy;Cryotherapy;Canalith Repostioning;Electrical Stimulation;Iontophoresis 4mg /ml Dexamethasone;Moist Heat;Ultrasound;DME Instruction;Gait training;Stair training;Functional mobility training;Therapeutic activities;Patient/family education;Balance training;Therapeutic exercise;Neuromuscular re-education;Manual techniques;Vestibular   PT Next Visit Plan turning around cones and corners   PT Home Exercise Plan Single knee to chest stretch, seated hamstring stretch   Consulted and Agree with Plan of Care Patient      Patient will benefit from skilled therapeutic intervention in order to improve the following deficits and impairments:  Pain, Decreased balance, Postural dysfunction, Difficulty walking, Abnormal gait, Decreased strength  Visit Diagnosis: Weakness  Difficulty walking  Lumbar radiculitis     Problem List Patient Active Problem List   Diagnosis Date Noted  . Cephalalgia 07/24/2015  . Neuritis or radiculitis due to rupture of lumbar intervertebral disc 01/05/2015  . Degeneration of intervertebral disc of cervical region 04/14/2014  . B12 deficiency 12/23/2013  . Benign essential HTN 12/23/2013  . Type 2 diabetes mellitus (HCC) 12/23/2013  . Obstructive apnea 12/23/2013  . Lumbar canal stenosis 09/18/2013   Precious Bard, PT, DPT   Precious Bard 03/09/2017, 10:46 AM  [place] [facility] MAIN [facility] SERVICES [address] [place], [place], [address]  Phone: [phone]   Fax:  [phone]  Name: Loretta Webb MRN: [id number] Date of Birth: August 16, 1931

## 2017-03-13 ENCOUNTER — Ambulatory Visit: Payer: Medicare Other

## 2017-03-13 DIAGNOSIS — R531 Weakness: Secondary | ICD-10-CM

## 2017-03-13 DIAGNOSIS — R262 Difficulty in walking, not elsewhere classified: Secondary | ICD-10-CM

## 2017-03-13 DIAGNOSIS — M5416 Radiculopathy, lumbar region: Secondary | ICD-10-CM

## 2017-03-13 NOTE — Therapy (Signed)
[place] [facility] MAIN [facility] SERVICES [address] [place], [place], [address] Phone: [phone]   Fax:  [phone]  Physical Therapy Treatment  Patient Details  Name: Loretta Webb MRN: [id number] Date of Birth: 1932/04/21 Referring Provider: Merri Ray   Encounter Date: 03/13/2017  PT End of Session - 03/13/17 1119    Visit Number  25    Number of Visits  34    Date for PT Re-Evaluation  04/14/17    Authorization Type  3/10    PT Start Time  1114    PT Stop Time  1200    PT Time Calculation (min)  46 min    Equipment Utilized During Treatment  Gait belt    Activity Tolerance  Patient tolerated treatment well    Behavior During Therapy  [facility] for tasks assessed/performed       Past Medical History:  Diagnosis Date  . Arthritis   . Chronic fatigue   . DM2 (diabetes mellitus, type 2) (HCC)   . Esophageal stricture   . Fibromyalgia   . GERD (gastroesophageal reflux disease)   . Hypertension   . Lumbar stenosis   . Osteoporosis   . Sleep apnea     Past Surgical History:  Procedure Laterality Date  . ABDOMINAL HYSTERECTOMY    . APPENDECTOMY    . knee replacements    . ROTATOR CUFF REPAIR      There were no vitals filed for this visit.  Subjective Assessment - 03/13/17 1117    Subjective  Patient had a quiet morning, reports not doing her exercises but does not have a reason for why.     Pertinent History  Pt reports bilateral LE pain primarily in the back of the thighs. Pt reports that this has been occurring for a couple months. She denies any similar problems in the past. Pt reports sudden onset but no known trauma or change in activity. Pt has a history of a herniated disc in her low back but currently pt is not complaining of any back pain. Per medical record pt has a history of spinal stenosis. Lumbar MRI results reviewed and confirm moderate to severe spinal stenosis at L2-L3. Pain also occurs sometimes in her buttocks.  She has a history of bilateral hip bursitis which is mild in pain currently. She has had remote injections for her bursitis with relief. She describes the pain in her thighs as aching and throbbing. Easing factors: ibuprofen, hydrocodone, voltaren gel, sitting down, hasn't tried ice/heat or massage: Aggravating factors: unknown; Denies numbness/tingling. Denies bruising or swelling. Denies chills, fever, night sweats. Pt reports over the last two weeks worsening of bladder control but she is very unclear about this and it is not clear if it is accurate. Pain rarely wakes patient up at night sometimes. She does not note a difference in pain from AM to PM. Reports that the pain comes on suddenly and resolves as soon as she is able to use her voltaren gel and take pain meds. nable to notice change. Occurs approximately once/day. Worst: 9/10, Best: 0/10, Present 0/10; Pt reports she has been having frequent falls as well and has had one in January, May, and June (10/12/16). No injuries reported during falls. Two of the falls occurred when she has looked to the left and lost her balance. ROS negative for red flags with the exception of possible problems with bladder control but again this is nonspecific and poorly communicated.  Diagnostic tests  Lumbar MRI: multilevel degenerative changes, of note L2-L3 moderate to severe spinal stenosis    Patient Stated Goals  Resolve pain in her legs, "know why I'm falling"    Currently in Pain?  No/denies         Nustep Lvl 5 5 minutes (no charge)    TherEx: Sit to stands 2.5lb bar overhead raises 2x12; improved body mechanics; occasional verbal cueing for sequencing of UE raise.     3lb ankle weights:             Hip flexion 12x each leg             Hip extension 12x each leg             Hamstring curls (buttkickers) 12x each leg             Hip abduction 12x each leg   Quantum Leg press 90lb 2x10: cues for body  Mechanics, not allowing machine to slam down at  end range-control eccentric movement;    Quantum leg press, single limb press 45 lb 2x15 each leg    Walking around 6 cones with CGA. , 4 x, two LOB when turning towards R   High knee marches in // bars 4x decreasing UE support, performed with SUE with two episodes of LOB requiring PT to return pt. To COM  Side step onto 6" step with finger tip support 15x each side    Neuro Re-ed Airex: static balance tossing balloon back and forth with PT 30 tosses no LOB   Airex:  playing tic tac toe while reaching across body and forward, one LOB           Pt. response to medical necessity: . Patient will continue to benefit from skilled physical therapy to allow for safe mobility in natural environment.                      PT Education - 03/13/17 1119    Education provided  Yes    Education Details  hep compliance    Person(s) Educated  Patient    Methods  Explanation;Demonstration;Verbal cues    Comprehension  Verbalized understanding;Returned demonstration       PT Short Term Goals - 03/03/17 1055      PT SHORT TERM GOAL #1   Title  Patient will tolerate 5 seconds of single leg stance without loss of balance to improve ability to get in and out of shower safely.    Baseline  10/26: patient unable to perform SLS     Time  2    Period  Weeks    Status  New    Target Date  03/17/17      PT SHORT TERM GOAL #2   Title  Patient will be independent in bending down towards floor and picking up small object (<5 pounds) and then stand back up without loss of balance as to improve ability to pick up and clean up room at home    Baseline  Pt requires supervision when picking objects off of floor    Time  2    Period  Weeks    Status  New    Target Date  03/17/17        PT Long Term Goals - 03/03/17 1033      PT LONG TERM GOAL #1   Title   Pt will be independent with HEP in order to improve strength  and balance in order to decrease fall risk, improve function at  home, and decrease bilateral thigh pain.     Baseline  HEP given/modified    Time  6    Period  Weeks    Status  On-going      PT LONG TERM GOAL #2   Title  Pt will decrease mODI scoreby at least 13 points in order demonstrate clinically significant reduction in pain/disability     Baseline  10/26/16: 42% 10/3: 26% 10/26: 22%    Time  6    Period  Weeks    Status  Achieved      PT LONG TERM GOAL #3   Title  Pt will report worst bilateral thigh pain as 6/10 in order to demonstrate clinically significant reduction in her pain    Baseline  10/26/16: worst: 9/10,  8/9: 6-7/10 10/3: no pain    Time  6    Period  Weeks    Status  Achieved      PT LONG TERM GOAL #4   Title  Pt will demo no pelvic obliquities and increased L SIJ mobility across 2 visits in order to improve gait     Time  12    Period  Weeks    Status  Achieved      PT LONG TERM GOAL #5   Title  Pt will report no radiating pain during sleep and upon waking across 5 days in order to improve quality of sleep    Baseline  reports no pain     Time  12    Period  Weeks    Status  Achieved      Additional Long Term Goals   Additional Long Term Goals  Yes      PT LONG TERM GOAL #6   Title  Pt will maintain 4/5 BLE strength in hip ext, flexion, knee flexion/ext, hip abduction B across 1 month in order to transition safely to community Silver Sneaker classes     Baseline  10/3: 4/5 gross strength with knee flexion and ankles 4-/5 10/ 10/26: R 4-/5 with L 4+/5    Time  6    Period  Weeks    Status  On-going      PT LONG TERM GOAL #7   Title  Patient will increase Berg Balance score by > 6 points to demonstrate decreased fall risk during functional activities    Baseline  10/26: 42/56    Time  6    Period  Weeks    Status  New    Target Date  04/14/17            Plan - 03/13/17 1139    Clinical Impression Statement  Patient progressing with LE strength, increasing leg press weight weight to 90 lb this session.  Patient challenged by forward weight shift on unstable surface , improved with repetition when playing "tic tac toe" .Patient will continue to benefit from skilled physical therapy to allow for safe mobility in natural environment.    Rehab Potential  Fair    Clinical Impairments Affecting Rehab Potential  chronic back issues, age, pain, memory deficits    PT Frequency  1x / week    PT Duration  12 weeks    PT Treatment/Interventions  ADLs/Self Care Home Management;Aquatic Therapy;Cryotherapy;Canalith Repostioning;Electrical Stimulation;Iontophoresis 4mg /ml Dexamethasone;Moist Heat;Ultrasound;DME Instruction;Gait training;Stair training;Functional mobility training;Therapeutic activities;Patient/family education;Balance training;Therapeutic exercise;Neuromuscular re-education;Manual techniques;Vestibular    PT Next Visit Plan  turning around cones and  corners    PT Home Exercise Plan  Single knee to chest stretch, seated hamstring stretch    Consulted and Agree with Plan of Care  Patient       Patient will benefit from skilled therapeutic intervention in order to improve the following deficits and impairments:  Pain, Decreased balance, Postural dysfunction, Difficulty walking, Abnormal gait, Decreased strength  Visit Diagnosis: Weakness  Difficulty walking  Lumbar radiculitis     Problem List Patient Active Problem List   Diagnosis Date Noted  . Cephalalgia 07/24/2015  . Neuritis or radiculitis due to rupture of lumbar intervertebral disc 01/05/2015  . Degeneration of intervertebral disc of cervical region 04/14/2014  . B12 deficiency 12/23/2013  . Benign essential HTN 12/23/2013  . Type 2 diabetes mellitus (HCC) 12/23/2013  . Obstructive apnea 12/23/2013  . Lumbar canal stenosis 09/18/2013   Precious BardMarina Betsi Crespi, PT, DPT   Precious BardMarina Devontaye Ground 03/13/2017, 12:04 PM  [place] [facility] MAIN [facility] SERVICES [address] [place], [place], [address] Phone:  [phone]   Fax:  [phone]  Name: Loretta Webb MRN: [id number] Date of Birth: 12-07-1931

## 2017-03-15 ENCOUNTER — Ambulatory Visit: Payer: Medicare Other

## 2017-03-15 DIAGNOSIS — R531 Weakness: Secondary | ICD-10-CM

## 2017-03-15 DIAGNOSIS — M5416 Radiculopathy, lumbar region: Secondary | ICD-10-CM

## 2017-03-15 DIAGNOSIS — R262 Difficulty in walking, not elsewhere classified: Secondary | ICD-10-CM

## 2017-03-15 NOTE — Therapy (Signed)
[facility] [address] [place], [place], [address] Phone: [phone]   Fax:  [phone]  Physical Therapy Treatment  Patient Details  Name: Loretta Webb MRN: [id number] Date of Birth: [birth year] Referring Provider: Merri RayBenjamin Chasnis   Encounter Date: 03/15/2017  PT End of Session - 03/15/17 1113    Visit Number  26    Number of Visits  34    Date for PT Re-Evaluation  04/14/17    Authorization Type  4/10    PT Start Time  1108    PT Stop Time  1155    PT Time Calculation (min)  47 min    Equipment Utilized During Treatment  Gait belt    Activity Tolerance  Patient tolerated treatment well    Behavior During Therapy  [facility] for tasks assessed/performed       Past Medical History:  Diagnosis Date  . Arthritis   . Chronic fatigue   . DM2 (diabetes mellitus, type 2) (HCC)   . Esophageal stricture   . Fibromyalgia   . GERD (gastroesophageal reflux disease)   . Hypertension   . Lumbar stenosis   . Osteoporosis   . Sleep apnea     Past Surgical History:  Procedure Laterality Date  . ABDOMINAL HYSTERECTOMY    . APPENDECTOMY    . knee replacements    . ROTATOR CUFF REPAIR      There were no vitals filed for this visit.  Subjective Assessment - 03/15/17 1110    Subjective  Patient reports doing some of her HEP since last visit.  Patient reports feeling good this morning.     Pertinent History  Pt reports bilateral LE pain primarily in the back of the thighs. Pt reports that this has been occurring for a couple months. She denies any similar problems in the past. Pt reports sudden onset but no known trauma or change in activity. Pt has a history of a herniated disc in her low back but currently pt is not complaining of any back pain. Per medical record pt has a history of spinal stenosis. Lumbar MRI results reviewed and confirm moderate to severe spinal stenosis at L2-L3. Pain also occurs sometimes in her buttocks.  She has a history of bilateral hip bursitis which is mild in pain currently. She has had remote injections for her bursitis with relief. She describes the pain in her thighs as aching and throbbing. Easing factors: ibuprofen, hydrocodone, voltaren gel, sitting down, hasn't tried ice/heat or massage: Aggravating factors: unknown; Denies numbness/tingling. Denies bruising or swelling. Denies chills, fever, night sweats. Pt reports over the last two weeks worsening of bladder control but she is very unclear about this and it is not clear if it is accurate. Pain rarely wakes patient up at night sometimes. She does not note a difference in pain from AM to PM. Reports that the pain comes on suddenly and resolves as soon as she is able to use her voltaren gel and take pain meds. nable to notice change. Occurs approximately once/day. Worst: 9/10, Best: 0/10, Present 0/10; Pt reports she has been having frequent falls as well and has had one in January, May, and June (10/12/16). No injuries reported during falls. Two of the falls occurred when she has looked to the left and lost her balance. ROS negative for red flags with the exception of possible problems with bladder control but again this is nonspecific and poorly communicated.     Diagnostic  tests  Lumbar MRI: multilevel degenerative changes, of note L2-L3 moderate to severe spinal stenosis    Patient Stated Goals  Resolve pain in her legs, "know why I'm falling"    Currently in Pain?  No/denies       Nustep Level 5 4 minutes   TherEx Seated LAQ 10x with 3 second holds Seated marches 20x  Mini squat at // bars 10x Hip abduction 10x standing in // bars  heel raises 15x Toe raises 15x with UE support   3lb ankle weights:             Hip flexion 12x each leg             Hip extension 12x each leg             Hamstring curls (buttkickers) 12x each leg             Hip abduction 12x each leg   High knee marches in // bars 8x  , pt. With loss of breathe  towards end of intervention   6" step toe taps 20x each leg 40x total, BUE to SUE support, no LOB  6" step side step taps 20x each leg with BUE support   Neuro Re-ed Airex pad: horizontal and vertical head turns, 15 turns each direction, vertical more challenging Walking in hallway with horizontal head turns reading playing cards on wall. One LOB to left when reading card to right.      Pt. response to medical necessity: . Patient will continue to benefit from skilled physical therapy to allow for safe mobility in natural environment.              PT Education - 03/15/17 1112    Education provided  Yes    Education Details  new HEP     Person(s) Educated  Patient    Methods  Explanation;Demonstration;Handout    Comprehension  Verbalized understanding;Returned demonstration       PT Short Term Goals - 03/03/17 1055      PT SHORT TERM GOAL #1   Title  Patient will tolerate 5 seconds of single leg stance without loss of balance to improve ability to get in and out of shower safely.    Baseline  10/26: patient unable to perform SLS     Time  2    Period  Weeks    Status  New    Target Date  03/17/17      PT SHORT TERM GOAL #2   Title  Patient will be independent in bending down towards floor and picking up small object (<5 pounds) and then stand back up without loss of balance as to improve ability to pick up and clean up room at home    Baseline  Pt requires supervision when picking objects off of floor    Time  2    Period  Weeks    Status  New    Target Date  03/17/17        PT Long Term Goals - 03/03/17 1033      PT LONG TERM GOAL #1   Title   Pt will be independent with HEP in order to improve strength and balance in order to decrease fall risk, improve function at home, and decrease bilateral thigh pain.     Baseline  HEP given/modified    Time  6    Period  Weeks    Status  On-going  PT LONG TERM GOAL #2   Title  Pt will decrease mODI scoreby at  least 13 points in order demonstrate clinically significant reduction in pain/disability     Baseline  10/26/16: 42% 10/3: 26% 10/26: 22%    Time  6    Period  Weeks    Status  Achieved      PT LONG TERM GOAL #3   Title  Pt will report worst bilateral thigh pain as 6/10 in order to demonstrate clinically significant reduction in her pain    Baseline  10/26/16: worst: 9/10,  8/9: 6-7/10 10/3: no pain    Time  6    Period  Weeks    Status  Achieved      PT LONG TERM GOAL #4   Title  Pt will demo no pelvic obliquities and increased L SIJ mobility across 2 visits in order to improve gait     Time  12    Period  Weeks    Status  Achieved      PT LONG TERM GOAL #5   Title  Pt will report no radiating pain during sleep and upon waking across 5 days in order to improve quality of sleep    Baseline  reports no pain     Time  12    Period  Weeks    Status  Achieved      Additional Long Term Goals   Additional Long Term Goals  Yes      PT LONG TERM GOAL #6   Title  Pt will maintain 4/5 BLE strength in hip ext, flexion, knee flexion/ext, hip abduction B across 1 month in order to transition safely to community Silver Sneaker classes     Baseline  10/3: 4/5 gross strength with knee flexion and ankles 4-/5 10/ 10/26: R 4-/5 with L 4+/5    Time  6    Period  Weeks    Status  On-going      PT LONG TERM GOAL #7   Title  Patient will increase Berg Balance score by > 6 points to demonstrate decreased fall risk during functional activities    Baseline  10/26: 42/56    Time  6    Period  Weeks    Status  New    Target Date  04/14/17            Plan - 03/15/17 1146    Clinical Impression Statement  Patient educated on and performed new HEP with minimal cueing for body alignment and mechanics. Patient challenged by dynamic balance activities when ambulating, requiring PT assist to retain COM. Long duration aerobic activities challenge patient. Patient will continue to benefit from skilled  physical therapy to allow for safe mobility in natural environment.    Rehab Potential  Fair    Clinical Impairments Affecting Rehab Potential  chronic back issues, age, pain, memory deficits    PT Frequency  1x / week    PT Duration  12 weeks    PT Treatment/Interventions  ADLs/Self Care Home Management;Aquatic Therapy;Cryotherapy;Canalith Repostioning;Electrical Stimulation;Iontophoresis 4mg /ml Dexamethasone;Moist Heat;Ultrasound;DME Instruction;Gait training;Stair training;Functional mobility training;Therapeutic activities;Patient/family education;Balance training;Therapeutic exercise;Neuromuscular re-education;Manual techniques;Vestibular    PT Next Visit Plan  turning around cones and corners    PT Home Exercise Plan  Single knee to chest stretch, seated hamstring stretch    Consulted and Agree with Plan of Care  Patient       Patient will benefit from skilled therapeutic intervention in order to improve  the following deficits and impairments:  Pain, Decreased balance, Postural dysfunction, Difficulty walking, Abnormal gait, Decreased strength  Visit Diagnosis: Weakness  Difficulty walking  Lumbar radiculitis     Problem List Patient Active Problem List   Diagnosis Date Noted  . Cephalalgia 07/24/2015  . Neuritis or radiculitis due to rupture of lumbar intervertebral disc 01/05/2015  . Degeneration of intervertebral disc of cervical region 04/14/2014  . B12 deficiency 12/23/2013  . Benign essential HTN 12/23/2013  . Type 2 diabetes mellitus (HCC) 12/23/2013  . Obstructive apnea 12/23/2013  . Lumbar canal stenosis 09/18/2013   Precious Bard, PT, DPT   Precious Bard 03/15/2017, 11:55 AM  [facility] MAIN [facility] SERVICES [address] [place], [place], [address] Phone: [phone]   Fax:  [phone]  Name: Loretta Webb MRN: [id number] Date of Birth: 10-07-1931

## 2017-03-20 ENCOUNTER — Ambulatory Visit: Payer: Medicare Other

## 2017-03-20 DIAGNOSIS — R262 Difficulty in walking, not elsewhere classified: Secondary | ICD-10-CM

## 2017-03-20 DIAGNOSIS — R531 Weakness: Secondary | ICD-10-CM

## 2017-03-20 DIAGNOSIS — M5416 Radiculopathy, lumbar region: Secondary | ICD-10-CM

## 2017-03-20 NOTE — Therapy (Signed)
[facility] [address] [place], [place], [address] Phone: [phone]   Fax:  [phone]  Physical Therapy Treatment  Patient Details  Name: Loretta Webb MRN: [id number] Date of Birth: January 10, 1932 Referring Provider: Merri RayBenjamin Chasnis   Encounter Date: 03/20/2017  PT End of Session - 03/20/17 1116    Visit Number  27    Number of Visits  34    Date for PT Re-Evaluation  04/14/17    Authorization Type  5/10    PT Start Time  1110    PT Stop Time  1155    PT Time Calculation (min)  45 min    Equipment Utilized During Treatment  Gait belt    Activity Tolerance  Patient tolerated treatment well    Behavior During Therapy  [facility] for tasks assessed/performed       Past Medical History:  Diagnosis Date  . Arthritis   . Chronic fatigue   . DM2 (diabetes mellitus, type 2) (HCC)   . Esophageal stricture   . Fibromyalgia   . GERD (gastroesophageal reflux disease)   . Hypertension   . Lumbar stenosis   . Osteoporosis   . Sleep apnea     Past Surgical History:  Procedure Laterality Date  . ABDOMINAL HYSTERECTOMY    . APPENDECTOMY    . knee replacements    . ROTATOR CUFF REPAIR      There were no vitals filed for this visit.  Subjective Assessment - 03/20/17 1113    Subjective  Patient went to grocery store this morning, has been compliant with HEP.     Pertinent History  Pt reports bilateral LE pain primarily in the back of the thighs. Pt reports that this has been occurring for a couple months. She denies any similar problems in the past. Pt reports sudden onset but no known trauma or change in activity. Pt has a history of a herniated disc in her low back but currently pt is not complaining of any back pain. Per medical record pt has a history of spinal stenosis. Lumbar MRI results reviewed and confirm moderate to severe spinal stenosis at L2-L3. Pain also occurs sometimes in her buttocks. She has a history of  bilateral hip bursitis which is mild in pain currently. She has had remote injections for her bursitis with relief. She describes the pain in her thighs as aching and throbbing. Easing factors: ibuprofen, hydrocodone, voltaren gel, sitting down, hasn't tried ice/heat or massage: Aggravating factors: unknown; Denies numbness/tingling. Denies bruising or swelling. Denies chills, fever, night sweats. Pt reports over the last two weeks worsening of bladder control but she is very unclear about this and it is not clear if it is accurate. Pain rarely wakes patient up at night sometimes. She does not note a difference in pain from AM to PM. Reports that the pain comes on suddenly and resolves as soon as she is able to use her voltaren gel and take pain meds. nable to notice change. Occurs approximately once/day. Worst: 9/10, Best: 0/10, Present 0/10; Pt reports she has been having frequent falls as well and has had one in January, May, and June (10/12/16). No injuries reported during falls. Two of the falls occurred when she has looked to the left and lost her balance. ROS negative for red flags with the exception of possible problems with bladder control but again this is nonspecific and poorly communicated.     Diagnostic tests  Lumbar MRI: multilevel  degenerative changes, of note L2-L3 moderate to severe spinal stenosis    Patient Stated Goals  Resolve pain in her legs, "know why I'm falling"    Currently in Pain?  No/denies           Nustep Level 5 4 minutes TherEx    Quantum Leg press 100lb 2x10: cues for body Mechanics, not allowing machine to slam down at end range-control eccentric movement;   Quantum leg press, single limb press 60 lb 2x10each leg   4lb ankle weights:                Seated knee extensions 12x each leg                Seated marching 20x             Hip flexion 15x each leg             Hip extension 15x each leg             Hamstring curls (buttkickers) 15x each leg              Hip abduction 15x each leg   Sit to stand with 2lb bar overhead reaches 10x  Seated hamstring stretch 2x 40 seconds   Neuro Re-ed Airex pad: eyes closed 3x30 seconds, CGA. Good ankle strategy Airex pad: ball toss 20x    Pt. response to medical necessity: . Patient will continue to benefit from skilled physical therapy to allow for safe mobility in natural environment.                    PT Education - 03/20/17 1114    Education provided  Yes    Education Details  strengthening and Radio producer) Educated  Patient    Methods  Explanation;Demonstration;Verbal cues    Comprehension  Verbalized understanding;Returned demonstration       PT Short Term Goals - 03/03/17 1055      PT SHORT TERM GOAL #1   Title  Patient will tolerate 5 seconds of single leg stance without loss of balance to improve ability to get in and out of shower safely.    Baseline  10/26: patient unable to perform SLS     Time  2    Period  Weeks    Status  New    Target Date  03/17/17      PT SHORT TERM GOAL #2   Title  Patient will be independent in bending down towards floor and picking up small object (<5 pounds) and then stand back up without loss of balance as to improve ability to pick up and clean up room at home    Baseline  Pt requires supervision when picking objects off of floor    Time  2    Period  Weeks    Status  New    Target Date  03/17/17        PT Long Term Goals - 03/03/17 1033      PT LONG TERM GOAL #1   Title   Pt will be independent with HEP in order to improve strength and balance in order to decrease fall risk, improve function at home, and decrease bilateral thigh pain.     Baseline  HEP given/modified    Time  6    Period  Weeks    Status  On-going      PT LONG TERM GOAL #2   Title  Pt will decrease mODI scoreby at least 13 points in order demonstrate clinically significant reduction in pain/disability     Baseline  10/26/16: 42% 10/3: 26%  10/26: 22%    Time  6    Period  Weeks    Status  Achieved      PT LONG TERM GOAL #3   Title  Pt will report worst bilateral thigh pain as 6/10 in order to demonstrate clinically significant reduction in her pain    Baseline  10/26/16: worst: 9/10,  8/9: 6-7/10 10/3: no pain    Time  6    Period  Weeks    Status  Achieved      PT LONG TERM GOAL #4   Title  Pt will demo no pelvic obliquities and increased L SIJ mobility across 2 visits in order to improve gait     Time  12    Period  Weeks    Status  Achieved      PT LONG TERM GOAL #5   Title  Pt will report no radiating pain during sleep and upon waking across 5 days in order to improve quality of sleep    Baseline  reports no pain     Time  12    Period  Weeks    Status  Achieved      Additional Long Term Goals   Additional Long Term Goals  Yes      PT LONG TERM GOAL #6   Title  Pt will maintain 4/5 BLE strength in hip ext, flexion, knee flexion/ext, hip abduction B across 1 month in order to transition safely to community Silver Sneaker classes     Baseline  10/3: 4/5 gross strength with knee flexion and ankles 4-/5 10/ 10/26: R 4-/5 with L 4+/5    Time  6    Period  Weeks    Status  On-going      PT LONG TERM GOAL #7   Title  Patient will increase Berg Balance score by > 6 points to demonstrate decreased fall risk during functional activities    Baseline  10/26: 42/56    Time  6    Period  Weeks    Status  New    Target Date  04/14/17            Plan - 03/20/17 1144    Clinical Impression Statement  Patient progressing with functional strength, increasing weights throughout session with leg press and ankle weights. Good ankle strategy noted upon unstable surface with eyes closed. Patient will continue to benefit from skilled physical therapy to allow for safe mobility in natural environment.    Rehab Potential  Fair    Clinical Impairments Affecting Rehab Potential  chronic back issues, age, pain, memory  deficits    PT Frequency  1x / week    PT Duration  12 weeks    PT Treatment/Interventions  ADLs/Self Care Home Management;Aquatic Therapy;Cryotherapy;Canalith Repostioning;Electrical Stimulation;Iontophoresis 4mg /ml Dexamethasone;Moist Heat;Ultrasound;DME Instruction;Gait training;Stair training;Functional mobility training;Therapeutic activities;Patient/family education;Balance training;Therapeutic exercise;Neuromuscular re-education;Manual techniques;Vestibular    PT Next Visit Plan  turning around cones and corners    PT Home Exercise Plan  Single knee to chest stretch, seated hamstring stretch    Consulted and Agree with Plan of Care  Patient       Patient will benefit from skilled therapeutic intervention in order to improve the following deficits and impairments:  Pain, Decreased balance, Postural dysfunction, Difficulty walking, Abnormal gait, Decreased strength  Visit  Diagnosis: Weakness  Difficulty walking  Lumbar radiculitis     Problem List Patient Active Problem List   Diagnosis Date Noted  . Cephalalgia 07/24/2015  . Neuritis or radiculitis due to rupture of lumbar intervertebral disc 01/05/2015  . Degeneration of intervertebral disc of cervical region 04/14/2014  . B12 deficiency 12/23/2013  . Benign essential HTN 12/23/2013  . Type 2 diabetes mellitus (HCC) 12/23/2013  . Obstructive apnea 12/23/2013  . Lumbar canal stenosis 09/18/2013   Precious Bard, PT, DPT   Precious Bard 03/20/2017, 11:55 AM  [place] [facility] MAIN [facility] SERVICES [address] [place], [place], [address] Phone: [phone]   Fax:  [phone]  Name: Loretta Webb MRN: [id number] Date of Birth: 02/18/1932

## 2017-03-22 ENCOUNTER — Ambulatory Visit: Payer: Medicare Other

## 2017-03-22 DIAGNOSIS — R531 Weakness: Secondary | ICD-10-CM

## 2017-03-22 DIAGNOSIS — M5416 Radiculopathy, lumbar region: Secondary | ICD-10-CM

## 2017-03-22 DIAGNOSIS — R262 Difficulty in walking, not elsewhere classified: Secondary | ICD-10-CM

## 2017-03-22 DIAGNOSIS — M7062 Trochanteric bursitis, left hip: Secondary | ICD-10-CM

## 2017-03-22 NOTE — Therapy (Signed)
[facility] [address] [place], [place], [address] Phone: [phone]   Fax:  [phone]  Physical Therapy Treatment  Patient Details  Name: Loretta ChessmanRichie S Bordeaux MRN: [id number] Date of Birth: February 25, 1932 Referring Provider: Merri RayBenjamin Chasnis   Encounter Date: 03/22/2017  PT End of Session - 03/22/17 0840    Visit Number  28    Number of Visits  34    Date for PT Re-Evaluation  04/14/17    Authorization Type  6/10    PT Start Time  0842    PT Stop Time  0930    PT Time Calculation (min)  48 min    Equipment Utilized During Treatment  Gait belt    Activity Tolerance  Patient tolerated treatment well    Behavior During Therapy  [facility] for tasks assessed/performed       Past Medical History:  Diagnosis Date  . Arthritis   . Chronic fatigue   . DM2 (diabetes mellitus, type 2) (HCC)   . Esophageal stricture   . Fibromyalgia   . GERD (gastroesophageal reflux disease)   . Hypertension   . Lumbar stenosis   . Osteoporosis   . Sleep apnea     Past Surgical History:  Procedure Laterality Date  . ABDOMINAL HYSTERECTOMY    . APPENDECTOMY    . knee replacements    . ROTATOR CUFF REPAIR      There were no vitals filed for this visit.  Subjective Assessment - 03/22/17 0844    Subjective  Patient happy to have an early appointment, feeling good this morning. Reports compliance with HEP    Pertinent History  Pt reports bilateral LE pain primarily in the back of the thighs. Pt reports that this has been occurring for a couple months. She denies any similar problems in the past. Pt reports sudden onset but no known trauma or change in activity. Pt has a history of a herniated disc in her low back but currently pt is not complaining of any back pain. Per medical record pt has a history of spinal stenosis. Lumbar MRI results reviewed and confirm moderate to severe spinal stenosis at L2-L3. Pain also occurs sometimes in her buttocks.  She has a history of bilateral hip bursitis which is mild in pain currently. She has had remote injections for her bursitis with relief. She describes the pain in her thighs as aching and throbbing. Easing factors: ibuprofen, hydrocodone, voltaren gel, sitting down, hasn't tried ice/heat or massage: Aggravating factors: unknown; Denies numbness/tingling. Denies bruising or swelling. Denies chills, fever, night sweats. Pt reports over the last two weeks worsening of bladder control but she is very unclear about this and it is not clear if it is accurate. Pain rarely wakes patient up at night sometimes. She does not note a difference in pain from AM to PM. Reports that the pain comes on suddenly and resolves as soon as she is able to use her voltaren gel and take pain meds. nable to notice change. Occurs approximately once/day. Worst: 9/10, Best: 0/10, Present 0/10; Pt reports she has been having frequent falls as well and has had one in January, May, and June (10/12/16). No injuries reported during falls. Two of the falls occurred when she has looked to the left and lost her balance. ROS negative for red flags with the exception of possible problems with bladder control but again this is nonspecific and poorly communicated.     Diagnostic tests  Lumbar  MRI: multilevel degenerative changes, of note L2-L3 moderate to severe spinal stenosis    Patient Stated Goals  Resolve pain in her legs, "know why I'm falling"    Currently in Pain?  No/denies       Nustep Level 5 4 minutes TherEx   High knee marches in // bars 4x length of  Bars  Side step with squat and BUE support in // bars 16x  Stair stretch hamstrings 2x 60 bilaterally BUE support, cues for keeping toes up in the air    Quantum Leg press 100lb 2x12: cues for body  Mechanics, not allowing machine to slam down at end range-control eccentric movement;    Quantum leg press, single limb press 60 lb 2x12 each leg   Seated knee extension GTB around  ankles for home   5lb ankle weights:              Seated knee extensions 2x12x each leg w/ 3 sec holds    Sit to stand with 2lb bar overhead reaches 10x   Seated hamstring stretch 2x 40 seconds   Neuro Re-ed Airex pad balance beam: tandem walk with decreasing UE support (BUE to SUE and finger tip support). Patient requires CGA and verbal encouragement. Airex pad balance beam: side stepping With decreasing UE support. One LOB.      Pt. response to medical necessity: . Patient will continue to benefit from skilled physical therapy to allow for safe mobility in natural environment.                        PT Education - 03/22/17 0845    Education provided  Yes    Education Details  strengthening and body mechanics    Person(s) Educated  Patient    Methods  Explanation;Demonstration;Verbal cues    Comprehension  Verbalized understanding;Returned demonstration       PT Short Term Goals - 03/03/17 1055      PT SHORT TERM GOAL #1   Title  Patient will tolerate 5 seconds of single leg stance without loss of balance to improve ability to get in and out of shower safely.    Baseline  10/26: patient unable to perform SLS     Time  2    Period  Weeks    Status  New    Target Date  03/17/17      PT SHORT TERM GOAL #2   Title  Patient will be independent in bending down towards floor and picking up small object (<5 pounds) and then stand back up without loss of balance as to improve ability to pick up and clean up room at home    Baseline  Pt requires supervision when picking objects off of floor    Time  2    Period  Weeks    Status  New    Target Date  03/17/17        PT Long Term Goals - 03/03/17 1033      PT LONG TERM GOAL #1   Title   Pt will be independent with HEP in order to improve strength and balance in order to decrease fall risk, improve function at home, and decrease bilateral thigh pain.     Baseline  HEP given/modified    Time  6    Period   Weeks    Status  On-going      PT LONG TERM GOAL #2   Title  Pt will  decrease mODI scoreby at least 13 points in order demonstrate clinically significant reduction in pain/disability     Baseline  10/26/16: 42% 10/3: 26% 10/26: 22%    Time  6    Period  Weeks    Status  Achieved      PT LONG TERM GOAL #3   Title  Pt will report worst bilateral thigh pain as 6/10 in order to demonstrate clinically significant reduction in her pain    Baseline  10/26/16: worst: 9/10,  8/9: 6-7/10 10/3: no pain    Time  6    Period  Weeks    Status  Achieved      PT LONG TERM GOAL #4   Title  Pt will demo no pelvic obliquities and increased L SIJ mobility across 2 visits in order to improve gait     Time  12    Period  Weeks    Status  Achieved      PT LONG TERM GOAL #5   Title  Pt will report no radiating pain during sleep and upon waking across 5 days in order to improve quality of sleep    Baseline  reports no pain     Time  12    Period  Weeks    Status  Achieved      Additional Long Term Goals   Additional Long Term Goals  Yes      PT LONG TERM GOAL #6   Title  Pt will maintain 4/5 BLE strength in hip ext, flexion, knee flexion/ext, hip abduction B across 1 month in order to transition safely to community Silver Sneaker classes     Baseline  10/3: 4/5 gross strength with knee flexion and ankles 4-/5 10/ 10/26: R 4-/5 with L 4+/5    Time  6    Period  Weeks    Status  On-going      PT LONG TERM GOAL #7   Title  Patient will increase Berg Balance score by > 6 points to demonstrate decreased fall risk during functional activities    Baseline  10/26: 42/56    Time  6    Period  Weeks    Status  New    Target Date  04/14/17            Plan - 03/22/17 0913    Clinical Impression Statement  Patient has noted weakness of RLE more than LLE, having differences of " hard" and "easy" for same weight. New exercises added to HEP for strengthening and stretching with patient demonstrating  understanding.  Patient not compensating for RLE weakness with trunk mechanics when ambulating, balance deficits for single limb stance on RLE noted. Patient will continue to benefit from skilled physical therapy to allow for safe mobility in natural environmen    Rehab Potential  Fair    Clinical Impairments Affecting Rehab Potential  chronic back issues, age, pain, memory deficits    PT Frequency  1x / week    PT Duration  12 weeks    PT Treatment/Interventions  ADLs/Self Care Home Management;Aquatic Therapy;Cryotherapy;Canalith Repostioning;Electrical Stimulation;Iontophoresis 4mg /ml Dexamethasone;Moist Heat;Ultrasound;DME Instruction;Gait training;Stair training;Functional mobility training;Therapeutic activities;Patient/family education;Balance training;Therapeutic exercise;Neuromuscular re-education;Manual techniques;Vestibular    PT Next Visit Plan  turning around cones and corners    PT Home Exercise Plan  Single knee to chest stretch, seated hamstring stretch    Consulted and Agree with Plan of Care  Patient       Patient will benefit from skilled  therapeutic intervention in order to improve the following deficits and impairments:  Pain, Decreased balance, Postural dysfunction, Difficulty walking, Abnormal gait, Decreased strength  Visit Diagnosis: Weakness  Difficulty walking  Lumbar radiculitis  Trochanteric bursitis of left hip     Problem List Patient Active Problem List   Diagnosis Date Noted  . Cephalalgia 07/24/2015  . Neuritis or radiculitis due to rupture of lumbar intervertebral disc 01/05/2015  . Degeneration of intervertebral disc of cervical region 04/14/2014  . B12 deficiency 12/23/2013  . Benign essential HTN 12/23/2013  . Type 2 diabetes mellitus (HCC) 12/23/2013  . Obstructive apnea 12/23/2013  . Lumbar canal stenosis 09/18/2013   Precious Bard, PT, DPT   Precious Bard 03/22/2017, 9:31 AM  [facility] [facility] MAIN [facility]  SERVICES [address] [place], [place], [address] Phone: [phone]   Fax:  [phone]  Name: SHAMICKA INGA MRN: [id number] Date of Birth: [birth year]

## 2017-03-27 ENCOUNTER — Ambulatory Visit: Payer: Medicare Other

## 2017-03-27 DIAGNOSIS — R262 Difficulty in walking, not elsewhere classified: Secondary | ICD-10-CM

## 2017-03-27 DIAGNOSIS — M5416 Radiculopathy, lumbar region: Secondary | ICD-10-CM

## 2017-03-27 DIAGNOSIS — R531 Weakness: Secondary | ICD-10-CM | POA: Diagnosis not present

## 2017-03-27 NOTE — Therapy (Signed)
[facility] [address] [place], [place], [address] Phone: [phone]   Fax:  [phone]  Physical Therapy Treatment  Patient Details  Name: Loretta Webb MRN: [id number] Date of Birth: 24-Apr-1932 Referring Provider: Merri RayBenjamin Chasnis   Encounter Date: 03/27/2017  PT End of Session - 03/27/17 0956    Visit Number  29    Number of Visits  34    Date for PT Re-Evaluation  04/14/17    Authorization Type  7/10    PT Start Time  0958    PT Stop Time  1045    PT Time Calculation (min)  47 min    Equipment Utilized During Treatment  Gait belt    Activity Tolerance  Patient tolerated treatment well    Behavior During Therapy  [facility] for tasks assessed/performed       Past Medical History:  Diagnosis Date  . Arthritis   . Chronic fatigue   . DM2 (diabetes mellitus, type 2) (HCC)   . Esophageal stricture   . Fibromyalgia   . GERD (gastroesophageal reflux disease)   . Hypertension   . Lumbar stenosis   . Osteoporosis   . Sleep apnea     Past Surgical History:  Procedure Laterality Date  . ABDOMINAL HYSTERECTOMY    . APPENDECTOMY    . knee replacements    . ROTATOR CUFF REPAIR      There were no vitals filed for this visit.  Subjective Assessment - 03/27/17 1000    Subjective  Patient has some stiffness and soreness from the weather, reports no pain. Reports compliance with HEP.     Pertinent History  Pt reports bilateral LE pain primarily in the back of the thighs. Pt reports that this has been occurring for a couple months. She denies any similar problems in the past. Pt reports sudden onset but no known trauma or change in activity. Pt has a history of a herniated disc in her low back but currently pt is not complaining of any back pain. Per medical record pt has a history of spinal stenosis. Lumbar MRI results reviewed and confirm moderate to severe spinal stenosis at L2-L3. Pain also occurs sometimes in her  buttocks. She has a history of bilateral hip bursitis which is mild in pain currently. She has had remote injections for her bursitis with relief. She describes the pain in her thighs as aching and throbbing. Easing factors: ibuprofen, hydrocodone, voltaren gel, sitting down, hasn't tried ice/heat or massage: Aggravating factors: unknown; Denies numbness/tingling. Denies bruising or swelling. Denies chills, fever, night sweats. Pt reports over the last two weeks worsening of bladder control but she is very unclear about this and it is not clear if it is accurate. Pain rarely wakes patient up at night sometimes. She does not note a difference in pain from AM to PM. Reports that the pain comes on suddenly and resolves as soon as she is able to use her voltaren gel and take pain meds. nable to notice change. Occurs approximately once/day. Worst: 9/10, Best: 0/10, Present 0/10; Pt reports she has been having frequent falls as well and has had one in January, May, and June (10/12/16). No injuries reported during falls. Two of the falls occurred when she has looked to the left and lost her balance. ROS negative for red flags with the exception of possible problems with bladder control but again this is nonspecific and poorly communicated.     Diagnostic tests  Lumbar MRI: multilevel degenerative changes, of note L2-L3 moderate to severe spinal stenosis    Patient Stated Goals  Resolve pain in her legs, "know why I'm falling"    Currently in Pain?  No/denies        Nustep Level 5 4 minutes TherEx   6" side step toe taps 20x each leg   seated hamstring stretch 2x 60 seconds each leg   5lb ankle weights: (harder on LLE)              Seated knee extensions 2x12x each leg w/ 3 sec holds   Seated marches 20x               Standing hip flexion 1x15               Standing hip extension 1x15              Standing hip abduction 1x 15   Sit to stand with 2lb bar overhead reaches 10x, 4lb bar 10x    Seated  hamstring stretch 2x 40 seconds  Seated abduction GTB 2x15, cues for keeping feet flat on floor    Neuro Re-ed Ambulate with head turns in hall reading playing cards 2x100 ft , one episode of LOB, CGA required.   Airex pad balance beam: tandem walk with decreasing UE support (BUE to SUE and finger tip support). Patient requires CGA and verbal encouragement.  Airex pad balance beam: side stepping With decreasing UE support. One LOB.       Pt. response to medical necessity: . Patient will continue to benefit from skilled physical therapy to allow for safe mobility in natural environment.                        PT Education - 03/27/17 1001    Education provided  Yes    Education Details  Estate manager/land agent for strength and balance    Person(s) Educated  Patient    Methods  Explanation;Demonstration;Verbal cues    Comprehension  Verbalized understanding;Returned demonstration       PT Short Term Goals - 03/03/17 1055      PT SHORT TERM GOAL #1   Title  Patient will tolerate 5 seconds of single leg stance without loss of balance to improve ability to get in and out of shower safely.    Baseline  10/26: patient unable to perform SLS     Time  2    Period  Weeks    Status  New    Target Date  03/17/17      PT SHORT TERM GOAL #2   Title  Patient will be independent in bending down towards floor and picking up small object (<5 pounds) and then stand back up without loss of balance as to improve ability to pick up and clean up room at home    Baseline  Pt requires supervision when picking objects off of floor    Time  2    Period  Weeks    Status  New    Target Date  03/17/17        PT Long Term Goals - 03/03/17 1033      PT LONG TERM GOAL #1   Title   Pt will be independent with HEP in order to improve strength and balance in order to decrease fall risk, improve function at home, and decrease bilateral thigh pain.     Baseline  HEP given/modified  Time  6     Period  Weeks    Status  On-going      PT LONG TERM GOAL #2   Title  Pt will decrease mODI scoreby at least 13 points in order demonstrate clinically significant reduction in pain/disability     Baseline  10/26/16: 42% 10/3: 26% 10/26: 22%    Time  6    Period  Weeks    Status  Achieved      PT LONG TERM GOAL #3   Title  Pt will report worst bilateral thigh pain as 6/10 in order to demonstrate clinically significant reduction in her pain    Baseline  10/26/16: worst: 9/10,  8/9: 6-7/10 10/3: no pain    Time  6    Period  Weeks    Status  Achieved      PT LONG TERM GOAL #4   Title  Pt will demo no pelvic obliquities and increased L SIJ mobility across 2 visits in order to improve gait     Time  12    Period  Weeks    Status  Achieved      PT LONG TERM GOAL #5   Title  Pt will report no radiating pain during sleep and upon waking across 5 days in order to improve quality of sleep    Baseline  reports no pain     Time  12    Period  Weeks    Status  Achieved      Additional Long Term Goals   Additional Long Term Goals  Yes      PT LONG TERM GOAL #6   Title  Pt will maintain 4/5 BLE strength in hip ext, flexion, knee flexion/ext, hip abduction B across 1 month in order to transition safely to community Silver Sneaker classes     Baseline  10/3: 4/5 gross strength with knee flexion and ankles 4-/5 10/ 10/26: R 4-/5 with L 4+/5    Time  6    Period  Weeks    Status  On-going      PT LONG TERM GOAL #7   Title  Patient will increase Berg Balance score by > 6 points to demonstrate decreased fall risk during functional activities    Baseline  10/26: 42/56    Time  6    Period  Weeks    Status  New    Target Date  04/14/17            Plan - 03/27/17 1018    Clinical Impression Statement  Patient strength is improving requiring increase in weights on sit to stand intervention. Turning head while ambulating continues to challenge patient requiring CGA to prevent LOB.  Occasional Trendelenburg gait noted due to weak gluteal musculature. Patient will continue to benefit from skilled physical therapy to allow for safe mobility in natural environment.    Rehab Potential  Fair    Clinical Impairments Affecting Rehab Potential  chronic back issues, age, pain, memory deficits    PT Frequency  1x / week    PT Duration  12 weeks    PT Treatment/Interventions  ADLs/Self Care Home Management;Aquatic Therapy;Cryotherapy;Canalith Repostioning;Electrical Stimulation;Iontophoresis 4mg /ml Dexamethasone;Moist Heat;Ultrasound;DME Instruction;Gait training;Stair training;Functional mobility training;Therapeutic activities;Patient/family education;Balance training;Therapeutic exercise;Neuromuscular re-education;Manual techniques;Vestibular    PT Next Visit Plan  turning around cones and corners    PT Home Exercise Plan  Single knee to chest stretch, seated hamstring stretch    Consulted and Agree with Plan of  Care  Patient       Patient will benefit from skilled therapeutic intervention in order to improve the following deficits and impairments:  Pain, Decreased balance, Postural dysfunction, Difficulty walking, Abnormal gait, Decreased strength  Visit Diagnosis: Weakness  Difficulty walking  Lumbar radiculitis     Problem List Patient Active Problem List   Diagnosis Date Noted  . Cephalalgia 07/24/2015  . Neuritis or radiculitis due to rupture of lumbar intervertebral disc 01/05/2015  . Degeneration of intervertebral disc of cervical region 04/14/2014  . B12 deficiency 12/23/2013  . Benign essential HTN 12/23/2013  . Type 2 diabetes mellitus (HCC) 12/23/2013  . Obstructive apnea 12/23/2013  . Lumbar canal stenosis 09/18/2013   Precious BardMarina Evren Shankland, PT, DPT   Precious BardMarina Masayuki Sakai 03/27/2017, 10:45 AM  [place] [facility] [address] [place], [place], [address] Phone: [phone]   Fax:  [phone]  Name: Loretta ChessmanRichie S  Webb MRN: [id number] Date of Birth: 1931/06/05

## 2017-03-29 ENCOUNTER — Ambulatory Visit: Payer: Medicare Other

## 2017-03-29 DIAGNOSIS — M5416 Radiculopathy, lumbar region: Secondary | ICD-10-CM

## 2017-03-29 DIAGNOSIS — R531 Weakness: Secondary | ICD-10-CM

## 2017-03-29 DIAGNOSIS — M7062 Trochanteric bursitis, left hip: Secondary | ICD-10-CM

## 2017-03-29 DIAGNOSIS — R262 Difficulty in walking, not elsewhere classified: Secondary | ICD-10-CM

## 2017-03-29 NOTE — Therapy (Signed)
[facility] MAIN [facility] SERVICES [address] [place], [place], [address] Phone: [phone]   Fax:  [phone]  Physical Therapy Treatment  Patient Details  Name: Loretta Webb MRN: [id number] Date of Birth: [birth year] Referring Provider: Merri Ray   Encounter Date: 03/29/2017  PT End of Session - 03/29/17 0905    Visit Number  30    Number of Visits  34    Date for PT Re-Evaluation  04/14/17    Authorization Type  8/10    PT Start Time  0858    PT Stop Time  0945    PT Time Calculation (min)  47 min    Equipment Utilized During Treatment  Gait belt    Activity Tolerance  Patient tolerated treatment well    Behavior During Therapy  [facility] for tasks assessed/performed       Past Medical History:  Diagnosis Date  . Arthritis   . Chronic fatigue   . DM2 (diabetes mellitus, type 2) (HCC)   . Esophageal stricture   . Fibromyalgia   . GERD (gastroesophageal reflux disease)   . Hypertension   . Lumbar stenosis   . Osteoporosis   . Sleep apnea     Past Surgical History:  Procedure Laterality Date  . ABDOMINAL HYSTERECTOMY    . APPENDECTOMY    . knee replacements    . ROTATOR CUFF REPAIR      There were no vitals filed for this visit.  Subjective Assessment - 03/29/17 0902    Subjective  Patient has R knee pain and took ibuprofin due to pain. Limited in movement due to stiffness and pain.     Pertinent History  Pt reports bilateral LE pain primarily in the back of the thighs. Pt reports that this has been occurring for a couple months. She denies any similar problems in the past. Pt reports sudden onset but no known trauma or change in activity. Pt has a history of a herniated disc in her low back but currently pt is not complaining of any back pain. Per medical record pt has a history of spinal stenosis. Lumbar MRI results reviewed and confirm moderate to severe spinal stenosis at L2-L3. Pain also occurs sometimes in her  buttocks. She has a history of bilateral hip bursitis which is mild in pain currently. She has had remote injections for her bursitis with relief. She describes the pain in her thighs as aching and throbbing. Easing factors: ibuprofen, hydrocodone, voltaren gel, sitting down, hasn't tried ice/heat or massage: Aggravating factors: unknown; Denies numbness/tingling. Denies bruising or swelling. Denies chills, fever, night sweats. Pt reports over the last two weeks worsening of bladder control but she is very unclear about this and it is not clear if it is accurate. Pain rarely wakes patient up at night sometimes. She does not note a difference in pain from AM to PM. Reports that the pain comes on suddenly and resolves as soon as she is able to use her voltaren gel and take pain meds. nable to notice change. Occurs approximately once/day. Worst: 9/10, Best: 0/10, Present 0/10; Pt reports she has been having frequent falls as well and has had one in January, May, and June (10/12/16). No injuries reported during falls. Two of the falls occurred when she has looked to the left and lost her balance. ROS negative for red flags with the exception of possible problems with bladder control but again this is nonspecific and poorly communicated.  Diagnostic tests  Lumbar MRI: multilevel degenerative changes, of note L2-L3 moderate to severe spinal stenosis    Patient Stated Goals  Resolve pain in her legs, "know why I'm falling"    Currently in Pain?  Yes    Pain Score  7     Pain Location  Knee    Pain Orientation  Right    Pain Descriptors / Indicators  Aching    Pain Type  Acute pain           Nustep Level 5 4 minutes TherEx   Stair hamstring stretch 2x 60 seconds , cues for bringing toes back towards head   6: step toe taps single UE support 20x   6" step toe taps no UE support 20x each leg , one LOB  High knee marches  in // bars 4x no UE support  Side stepping in // bars no UE support  4x  Backwards walking in // bars with decreasing UE support 4x   seated hamstring stretch 2x 60 seconds each leg    Quantum Leg press100lb 2x12: cues for body Mechanics, not allowing machine to slam down at end range-control eccentric movement;   Quantum leg press, single limb press60lb 2x12each leg    Neuro Re-ed  Airex balance: Eyes closed 3x 60 seconds, require CGA  Airex: marching 2 minutes  Obstacle course : around 4 cones and over obstacles, CGA.  Require HHA to step over obstacles   Pt. response to medical necessity: . Patient will continue to benefit from skilled physical therapy to allow for safe mobility in natural environment.                    PT Education - 03/29/17 0904    Education provided  Yes    Education Details  Hamstring stretching    Person(s) Educated  Patient    Methods  Explanation;Demonstration;Verbal cues    Comprehension  Verbalized understanding;Returned demonstration       PT Short Term Goals - 03/03/17 1055      PT SHORT TERM GOAL #1   Title  Patient will tolerate 5 seconds of single leg stance without loss of balance to improve ability to get in and out of shower safely.    Baseline  10/26: patient unable to perform SLS     Time  2    Period  Weeks    Status  New    Target Date  03/17/17      PT SHORT TERM GOAL #2   Title  Patient will be independent in bending down towards floor and picking up small object (<5 pounds) and then stand back up without loss of balance as to improve ability to pick up and clean up room at home    Baseline  Pt requires supervision when picking objects off of floor    Time  2    Period  Weeks    Status  New    Target Date  03/17/17        PT Long Term Goals - 03/03/17 1033      PT LONG TERM GOAL #1   Title   Pt will be independent with HEP in order to improve strength and balance in order to decrease fall risk, improve function at home, and decrease bilateral thigh pain.      Baseline  HEP given/modified    Time  6    Period  Weeks    Status  On-going  PT LONG TERM GOAL #2   Title  Pt will decrease mODI scoreby at least 13 points in order demonstrate clinically significant reduction in pain/disability     Baseline  10/26/16: 42% 10/3: 26% 10/26: 22%    Time  6    Period  Weeks    Status  Achieved      PT LONG TERM GOAL #3   Title  Pt will report worst bilateral thigh pain as 6/10 in order to demonstrate clinically significant reduction in her pain    Baseline  10/26/16: worst: 9/10,  8/9: 6-7/10 10/3: no pain    Time  6    Period  Weeks    Status  Achieved      PT LONG TERM GOAL #4   Title  Pt will demo no pelvic obliquities and increased L SIJ mobility across 2 visits in order to improve gait     Time  12    Period  Weeks    Status  Achieved      PT LONG TERM GOAL #5   Title  Pt will report no radiating pain during sleep and upon waking across 5 days in order to improve quality of sleep    Baseline  reports no pain     Time  12    Period  Weeks    Status  Achieved      Additional Long Term Goals   Additional Long Term Goals  Yes      PT LONG TERM GOAL #6   Title  Pt will maintain 4/5 BLE strength in hip ext, flexion, knee flexion/ext, hip abduction B across 1 month in order to transition safely to community Silver Sneaker classes     Baseline  10/3: 4/5 gross strength with knee flexion and ankles 4-/5 10/ 10/26: R 4-/5 with L 4+/5    Time  6    Period  Weeks    Status  On-going      PT LONG TERM GOAL #7   Title  Patient will increase Berg Balance score by > 6 points to demonstrate decreased fall risk during functional activities    Baseline  10/26: 42/56    Time  6    Period  Weeks    Status  New    Target Date  04/14/17            Plan - 03/29/17 0923    Clinical Impression Statement  Patient R knee pain decreased with hamstring stretching. Performed balance interventions with improved stability and decreased LOB. Eyes closed  on Airex pad required CGA due to patient fear of LOB. Patient will continue to benefit from skilled physical therapy to allow for safe mobility in natural environment.    Rehab Potential  Fair    Clinical Impairments Affecting Rehab Potential  chronic back issues, age, pain, memory deficits    PT Frequency  1x / week    PT Duration  12 weeks    PT Treatment/Interventions  ADLs/Self Care Home Management;Aquatic Therapy;Cryotherapy;Canalith Repostioning;Electrical Stimulation;Iontophoresis 4mg /ml Dexamethasone;Moist Heat;Ultrasound;DME Instruction;Gait training;Stair training;Functional mobility training;Therapeutic activities;Patient/family education;Balance training;Therapeutic exercise;Neuromuscular re-education;Manual techniques;Vestibular    PT Next Visit Plan  turning around cones and corners    PT Home Exercise Plan  Single knee to chest stretch, seated hamstring stretch    Consulted and Agree with Plan of Care  Patient       Patient will benefit from skilled therapeutic intervention in order to improve the following deficits and impairments:  Pain, Decreased balance, Postural dysfunction, Difficulty walking, Abnormal gait, Decreased strength  Visit Diagnosis: Weakness  Difficulty walking  Lumbar radiculitis  Trochanteric bursitis of left hip     Problem List Patient Active Problem List   Diagnosis Date Noted  . Cephalalgia 07/24/2015  . Neuritis or radiculitis due to rupture of lumbar intervertebral disc 01/05/2015  . Degeneration of intervertebral disc of cervical region 04/14/2014  . B12 deficiency 12/23/2013  . Benign essential HTN 12/23/2013  . Type 2 diabetes mellitus (HCC) 12/23/2013  . Obstructive apnea 12/23/2013  . Lumbar canal stenosis 09/18/2013   Precious BardMarina Caridad Silveira, PT, DPT   Precious BardMarina Shellye Zandi 03/29/2017, 9:45 AM  [place] [facility] [address] , [place], [address] Phone: [phone]   Fax:   [phone]  Name: Loretta Webb MRN: [id number] Date of Birth: 1932-02-21

## 2017-04-03 ENCOUNTER — Ambulatory Visit: Payer: Medicare Other

## 2017-04-03 DIAGNOSIS — R531 Weakness: Secondary | ICD-10-CM | POA: Diagnosis not present

## 2017-04-03 DIAGNOSIS — M7062 Trochanteric bursitis, left hip: Secondary | ICD-10-CM

## 2017-04-03 DIAGNOSIS — R262 Difficulty in walking, not elsewhere classified: Secondary | ICD-10-CM

## 2017-04-03 DIAGNOSIS — M5416 Radiculopathy, lumbar region: Secondary | ICD-10-CM

## 2017-04-03 NOTE — Therapy (Signed)
[facility] MAIN [facility] SERVICES [address] [place], [place], [address] Phone: [phone]   Fax:  [phone]  Physical Therapy Treatment  Patient Details  Name: Loretta Webb MRN: [id number] Date of Birth: 11-04-1931 Referring Provider: Merri Ray   Encounter Date: 04/03/2017  PT End of Session - 04/03/17 0834    Visit Number  31    Number of Visits  34    Date for PT Re-Evaluation  04/14/17    Authorization Type  9/10    PT Start Time  0830    PT Stop Time  0915    PT Time Calculation (min)  45 min    Equipment Utilized During Treatment  Gait belt    Activity Tolerance  Patient tolerated treatment well    Behavior During Therapy  [facility] for tasks assessed/performed       Past Medical History:  Diagnosis Date  . Arthritis   . Chronic fatigue   . DM2 (diabetes mellitus, type 2) (HCC)   . Esophageal stricture   . Fibromyalgia   . GERD (gastroesophageal reflux disease)   . Hypertension   . Lumbar stenosis   . Osteoporosis   . Sleep apnea     Past Surgical History:  Procedure Laterality Date  . ABDOMINAL HYSTERECTOMY    . APPENDECTOMY    . knee replacements    . ROTATOR CUFF REPAIR      There were no vitals filed for this visit.  Subjective Assessment - 04/03/17 0833    Subjective  Patient had a good thanksgiving. Reports not moving as much due to the holiday time. Came back saturday from daughters.     Pertinent History  Pt reports bilateral LE pain primarily in the back of the thighs. Pt reports that this has been occurring for a couple months. She denies any similar problems in the past. Pt reports sudden onset but no known trauma or change in activity. Pt has a history of a herniated disc in her low back but currently pt is not complaining of any back pain. Per medical record pt has a history of spinal stenosis. Lumbar MRI results reviewed and confirm moderate to severe spinal stenosis at L2-L3. Pain also occurs  sometimes in her buttocks. She has a history of bilateral hip bursitis which is mild in pain currently. She has had remote injections for her bursitis with relief. She describes the pain in her thighs as aching and throbbing. Easing factors: ibuprofen, hydrocodone, voltaren gel, sitting down, hasn't tried ice/heat or massage: Aggravating factors: unknown; Denies numbness/tingling. Denies bruising or swelling. Denies chills, fever, night sweats. Pt reports over the last two weeks worsening of bladder control but she is very unclear about this and it is not clear if it is accurate. Pain rarely wakes patient up at night sometimes. She does not note a difference in pain from AM to PM. Reports that the pain comes on suddenly and resolves as soon as she is able to use her voltaren gel and take pain meds. nable to notice change. Occurs approximately once/day. Worst: 9/10, Best: 0/10, Present 0/10; Pt reports she has been having frequent falls as well and has had one in January, May, and June (10/12/16). No injuries reported during falls. Two of the falls occurred when she has looked to the left and lost her balance. ROS negative for red flags with the exception of possible problems with bladder control but again this is nonspecific and poorly communicated.  Diagnostic tests  Lumbar MRI: multilevel degenerative changes, of note L2-L3 moderate to severe spinal stenosis    Patient Stated Goals  Resolve pain in her legs, "know why I'm falling"    Currently in Pain?  No/denies       Nustep Level 5 4 minutes  TherEx   Stair hamstring stretch 2x 60 seconds , cues for bringing toes back towards head   6: step toe taps single UE support 20x    6" step toe taps no UE support 20x each leg    High knee marches  in // bars 4x no UE support   seated hamstring stretch 2x 60 seconds each leg  Large steps in // bars to promote hip flexion stretching and larger step length      Quantum Leg press 100lb 1x15:120lb x 10  cues for body  Mechanics, not allowing machine to slam down at end range-control eccentric movement;    Quantum leg press, single limb press 70 lb 2x15 each leg      Neuro Re-ed   Airex balance: Eyes closed 2x 60 seconds, require CGA  Airex balance: horizontal and vertical head turns 20x each direction    Airex: marching 2 minutes   Obstacle course : around 4 cones and over obstacles, CGA.  Require HHA to step over obstacles    Pt. response to medical necessity: Patient will continue to benefit from skilled physical therapy to allow for safe mobility in natural environment.                         PT Education - 04/03/17 0834    Education provided  Yes    Education Details  movement and silver sneakers    Person(s) Educated  Patient    Methods  Explanation;Demonstration    Comprehension  Verbalized understanding;Returned demonstration       PT Short Term Goals - 03/03/17 1055      PT SHORT TERM GOAL #1   Title  Patient will tolerate 5 seconds of single leg stance without loss of balance to improve ability to get in and out of shower safely.    Baseline  10/26: patient unable to perform SLS     Time  2    Period  Weeks    Status  New    Target Date  03/17/17      PT SHORT TERM GOAL #2   Title  Patient will be independent in bending down towards floor and picking up small object (<5 pounds) and then stand back up without loss of balance as to improve ability to pick up and clean up room at home    Baseline  Pt requires supervision when picking objects off of floor    Time  2    Period  Weeks    Status  New    Target Date  03/17/17        PT Long Term Goals - 03/03/17 1033      PT LONG TERM GOAL #1   Title   Pt will be independent with HEP in order to improve strength and balance in order to decrease fall risk, improve function at home, and decrease bilateral thigh pain.     Baseline  HEP given/modified    Time  6    Period  Weeks    Status   On-going      PT LONG TERM GOAL #2   Title  Pt will  decrease mODI scoreby at least 13 points in order demonstrate clinically significant reduction in pain/disability     Baseline  10/26/16: 42% 10/3: 26% 10/26: 22%    Time  6    Period  Weeks    Status  Achieved      PT LONG TERM GOAL #3   Title  Pt will report worst bilateral thigh pain as 6/10 in order to demonstrate clinically significant reduction in her pain    Baseline  10/26/16: worst: 9/10,  8/9: 6-7/10 10/3: no pain    Time  6    Period  Weeks    Status  Achieved      PT LONG TERM GOAL #4   Title  Pt will demo no pelvic obliquities and increased L SIJ mobility across 2 visits in order to improve gait     Time  12    Period  Weeks    Status  Achieved      PT LONG TERM GOAL #5   Title  Pt will report no radiating pain during sleep and upon waking across 5 days in order to improve quality of sleep    Baseline  reports no pain     Time  12    Period  Weeks    Status  Achieved      Additional Long Term Goals   Additional Long Term Goals  Yes      PT LONG TERM GOAL #6   Title  Pt will maintain 4/5 BLE strength in hip ext, flexion, knee flexion/ext, hip abduction B across 1 month in order to transition safely to community Silver Sneaker classes     Baseline  10/3: 4/5 gross strength with knee flexion and ankles 4-/5 10/ 10/26: R 4-/5 with L 4+/5    Time  6    Period  Weeks    Status  On-going      PT LONG TERM GOAL #7   Title  Patient will increase Berg Balance score by > 6 points to demonstrate decreased fall risk during functional activities    Baseline  10/26: 42/56    Time  6    Period  Weeks    Status  New    Target Date  04/14/17            Plan - 04/03/17 0855    Clinical Impression Statement  Patient stable with SUE support with dynamic motion. Pt. demonstrates fear when performing dynamic motion without UE support due to fear of falling however is able to perform tasks with encouragement. Patient  improving in strength with leg press strength increasing this session. Patient will continue to benefit from skilled physical therapy to allow for safe mobility in natural environment.    Rehab Potential  Fair    Clinical Impairments Affecting Rehab Potential  chronic back issues, age, pain, memory deficits    PT Frequency  1x / week    PT Duration  12 weeks    PT Treatment/Interventions  ADLs/Self Care Home Management;Aquatic Therapy;Cryotherapy;Canalith Repostioning;Electrical Stimulation;Iontophoresis 4mg /ml Dexamethasone;Moist Heat;Ultrasound;DME Instruction;Gait training;Stair training;Functional mobility training;Therapeutic activities;Patient/family education;Balance training;Therapeutic exercise;Neuromuscular re-education;Manual techniques;Vestibular    PT Next Visit Plan  G codes    PT Home Exercise Plan  Single knee to chest stretch, seated hamstring stretch    Consulted and Agree with Plan of Care  Patient       Patient will benefit from skilled therapeutic intervention in order to improve the following deficits and impairments:  Pain, Decreased  balance, Postural dysfunction, Difficulty walking, Abnormal gait, Decreased strength  Visit Diagnosis: Weakness  Difficulty walking  Lumbar radiculitis  Trochanteric bursitis of left hip     Problem List Patient Active Problem List   Diagnosis Date Noted  . Cephalalgia 07/24/2015  . Neuritis or radiculitis due to rupture of lumbar intervertebral disc 01/05/2015  . Degeneration of intervertebral disc of cervical region 04/14/2014  . B12 deficiency 12/23/2013  . Benign essential HTN 12/23/2013  . Type 2 diabetes mellitus (HCC) 12/23/2013  . Obstructive apnea 12/23/2013  . Lumbar canal stenosis 09/18/2013   Precious BardMarina Jamirra Curnow, PT, DPT   Precious BardMarina Aadvik Roker 04/03/2017, 9:15 AM  [place] [facility] [address] [place], [place], [address] Phone: [phone]   Fax:  [phone]  Name:  Angelica ChessmanRichie S Wyre MRN: [id number] Date of Birth: [birth year]

## 2017-04-05 ENCOUNTER — Ambulatory Visit: Payer: Medicare Other

## 2017-04-05 DIAGNOSIS — M5416 Radiculopathy, lumbar region: Secondary | ICD-10-CM

## 2017-04-05 DIAGNOSIS — R531 Weakness: Secondary | ICD-10-CM | POA: Diagnosis not present

## 2017-04-05 DIAGNOSIS — R262 Difficulty in walking, not elsewhere classified: Secondary | ICD-10-CM

## 2017-04-05 NOTE — Therapy (Signed)
[place] MAIN [facility] SERVICES [address] [place], [place], [address] Phone: [phone]   Fax:  [phone]  Physical Therapy Treatment  Patient Details  Name: Loretta Webb MRN: [id number] Date of Birth: [birth year] Referring Provider: Sharlet Salina   Encounter Date: 04/05/2017  PT End of Session - 04/05/17 0832    Visit Number  32    Number of Visits  34    Date for PT Re-Evaluation  04/14/17    Authorization Type  10/10    PT Start Time  0829    PT Stop Time  0915    PT Time Calculation (min)  46 min    Equipment Utilized During Treatment  Gait belt    Activity Tolerance  Patient tolerated treatment well    Behavior During Therapy  [facility] for tasks assessed/performed       Past Medical History:  Diagnosis Date  . Arthritis   . Chronic fatigue   . DM2 (diabetes mellitus, type 2) ([place])   . Esophageal stricture   . Fibromyalgia   . GERD (gastroesophageal reflux disease)   . Hypertension   . Lumbar stenosis   . Osteoporosis   . Sleep apnea     Past Surgical History:  Procedure Laterality Date  . ABDOMINAL HYSTERECTOMY    . APPENDECTOMY    . knee replacements    . ROTATOR CUFF REPAIR      There were no vitals filed for this visit.  Subjective Assessment - 04/05/17 0831    Subjective  Patient reports not walking much since last session. Will go to see about silver sneakers today after session.     Pertinent History  Pt reports bilateral LE pain primarily in the back of the thighs. Pt reports that this has been occurring for a couple months. She denies any similar problems in the past. Pt reports sudden onset but no known trauma or change in activity. Pt has a history of a herniated disc in her low back but currently pt is not complaining of any back pain. Per medical record pt has a history of spinal stenosis. Lumbar MRI results reviewed and confirm moderate to severe spinal stenosis at L2-L3. Pain also occurs sometimes in  her buttocks. She has a history of bilateral hip bursitis which is mild in pain currently. She has had remote injections for her bursitis with relief. She describes the pain in her thighs as aching and throbbing. Easing factors: ibuprofen, hydrocodone, voltaren gel, sitting down, hasn't tried ice/heat or massage: Aggravating factors: unknown; Denies numbness/tingling. Denies bruising or swelling. Denies chills, fever, night sweats. Pt reports over the last two weeks worsening of bladder control but she is very unclear about this and it is not clear if it is accurate. Pain rarely wakes patient up at night sometimes. She does not note a difference in pain from AM to PM. Reports that the pain comes on suddenly and resolves as soon as she is able to use her voltaren gel and take pain meds. nable to notice change. Occurs approximately once/day. Worst: 9/10, Best: 0/10, Present 0/10; Pt reports she has been having frequent falls as well and has had one in January, May, and June (10/12/16). No injuries reported during falls. Two of the falls occurred when she has looked to the left and lost her balance. ROS negative for red flags with the exception of possible problems with bladder control but again this is nonspecific and poorly communicated.  Diagnostic tests  Lumbar MRI: multilevel degenerative changes, of note L2-L3 moderate to severe spinal stenosis    Patient Stated Goals  Resolve pain in her legs, "know why I'm falling"    Currently in Pain?  No/denies       5 seconds SLS: L 5 seconds R 6 seconds   BERG= 50/56  22 heel raises   Right Left  Hip flexion 4+/5 4+/5  Hip Abduction 4+/5 4+/5  Hip Adduction 4/5 4/5  Knee Extension  4/5 4+/5  Knee Flexion 4/5 4+/5  DF 4/5 4/5  PF 4+/5 4+/5       Nustep Level 5 4 minutes   TherEx   Stair hamstring stretch 2x 60 seconds , cues for bringing toes back towards head   seated straight leg abduction with 15 with cues for upright posture   Seated  marches 20x with cues for upright posture  Seated heel toe rocker 20x to promote exercises for pt. To perform while reading     Quantum Leg press:120lb x 2x15 cues for body  Mechanics, not allowing machine to slam down at end range-control eccentric movement;    Quantum leg press, single limb press 70 lb 2x15 each leg        Pt. response to medical necessity: Patient will continue to benefit from skilled physical therapy to allow for safe mobility in natural environment.                         PT Education - 04/05/17 [phone]    Education provided  Yes    Education Details  POC, silver sneakers    Person(s) Educated  Patient    Methods  Explanation;Demonstration;Verbal cues    Comprehension  Verbalized understanding;Returned demonstration       PT Short Term Goals - 04/05/17 0854      PT SHORT TERM GOAL #1   Title  Patient will tolerate 5 seconds of single leg stance without loss of balance to improve ability to get in and out of shower safely.    Baseline  11/28: L 5 seconds R 6 seconds    Time  2    Period  Weeks    Status  Achieved      PT SHORT TERM GOAL #2   Title  Patient will be independent in bending down towards floor and picking up small object (<5 pounds) and then stand back up without loss of balance as to improve ability to pick up and clean up room at home    Baseline  independent in picking up objects from floor    Time  2    Period  Weeks    Status  Achieved        PT Long Term Goals - 04/05/17 0855      PT LONG TERM GOAL #1   Title   Pt will be independent with HEP in order to improve strength and balance in order to decrease fall risk, improve function at home, and decrease bilateral thigh pain.     Baseline  HEP independent    Time  6    Period  Weeks    Status  Achieved      PT LONG TERM GOAL #2   Title  Pt will decrease mODI scoreby at least 13 points in order demonstrate clinically significant reduction in pain/disability      Baseline  10/26/16: 42% 10/3: 26% 10/26: 22%    Time  6    Period  Weeks    Status  Achieved      PT LONG TERM GOAL #3   Title  Pt will report worst bilateral thigh pain as 6/10 in order to demonstrate clinically significant reduction in her pain    Baseline  10/26/16: worst: 9/10,  8/9: 6-7/10 10/3: no pain    Time  6    Period  Weeks    Status  Achieved      PT LONG TERM GOAL #4   Title  Pt will demo no pelvic obliquities and increased L SIJ mobility across 2 visits in order to improve gait     Time  12    Period  Weeks    Status  Achieved      PT LONG TERM GOAL #5   Title  Pt will report no radiating pain during sleep and upon waking across 5 days in order to improve quality of sleep    Baseline  reports no pain     Time  12    Period  Weeks    Status  Achieved      PT LONG TERM GOAL #6   Title  Pt will maintain 4/5 BLE strength in hip ext, flexion, knee flexion/ext, hip abduction B across 1 month in order to transition safely to community Silver Sneaker classes     Baseline  10/3: 4/5 gross strength with knee flexion and ankles 4-/5 10/ 10/26: R 4-/5 with L 4+/5; 05-03-23: R 4/5 , L 4+/5    Time  6    Period  Weeks    Status  Achieved      PT LONG TERM GOAL #7   Title  Patient will increase Berg Balance score by > 6 points to demonstrate decreased fall risk during functional activities    Baseline  10/26: 42/56 May 03, 2023: 50/56    Time  6    Period  Weeks    Status  Achieved            Plan - 2017/05/02 0900    Clinical Impression Statement  Patient met all goals at this time. Balance has improved with static and dynamic motion as seen in increase of score to 50/56. Single leg stance improved bilaterally demonstrating improved balance for ADLs. LE strength improved bilaterally with R slightly weaker than L, however still functional. Patient progressed to home based program with supplemental silver sneakers. I will be happy to see patient again in the future.    Rehab  Potential  Fair    Clinical Impairments Affecting Rehab Potential  chronic back issues, age, pain, memory deficits    PT Frequency  1x / week    PT Duration  12 weeks    PT Treatment/Interventions  ADLs/Self Care Home Management;Aquatic Therapy;Cryotherapy;Canalith Repostioning;Electrical Stimulation;Iontophoresis '4mg'$ /ml Dexamethasone;Moist Heat;Ultrasound;DME Instruction;Gait training;Stair training;Functional mobility training;Therapeutic activities;Patient/family education;Balance training;Therapeutic exercise;Neuromuscular re-education;Manual techniques;Vestibular    PT Home Exercise Plan  Single knee to chest stretch, seated hamstring stretch    Consulted and Agree with Plan of Care  Patient       Patient will benefit from skilled therapeutic intervention in order to improve the following deficits and impairments:  Pain, Decreased balance, Postural dysfunction, Difficulty walking, Abnormal gait, Decreased strength  Visit Diagnosis: Weakness  Difficulty walking  Lumbar radiculitis   G-Codes - 05-02-2017 0857    Functional Assessment Tool Used (Outpatient Only)  MMT,  SLS clinical judgement, VAS, BERG    Functional Limitation  Mobility: Walking  and moving around    Mobility: Walking and Moving Around Current Status [phone])  At least 1 percent but less than 20 percent impaired, limited or restricted    Mobility: Walking and Moving Around Goal Status [phone])  At least 1 percent but less than 20 percent impaired, limited or restricted    Mobility: Walking and Moving Around Discharge Status [phone])  At least 1 percent but less than 20 percent impaired, limited or restricted       Problem List Patient Active Problem List   Diagnosis Date Noted  . Cephalalgia 07/24/2015  . Neuritis or radiculitis due to rupture of lumbar intervertebral disc 01/05/2015  . Degeneration of intervertebral disc of cervical region 04/14/2014  . B12 deficiency 12/23/2013  . Benign essential HTN 12/23/2013  .  Type 2 diabetes mellitus ([place]) 12/23/2013  . Obstructive apnea 12/23/2013  . Lumbar canal stenosis 09/18/2013  Janna Arch, PT, DPT    Janna Arch 04/05/2017, 9:15 AM  [place] MAIN [facility] SERVICES [address] [place], [place], [address] Phone: [phone]   Fax:  [phone]  Name: KRYSTEN VERONICA MRN: [id number] Date of Birth: [birth year]

## 2017-04-10 ENCOUNTER — Ambulatory Visit: Payer: Medicare Other

## 2017-04-12 ENCOUNTER — Ambulatory Visit: Payer: Medicare Other

## 2017-10-29 IMAGING — CR DG CHEST 2V
2 series · 2 of 2 positions shown · non-contrast
Comparison: Chest radiograph dated 05/08/2015

CLINICAL DATA: 85-year-old female with chest pain.

EXAM:
CHEST  2 VIEW

[chest lat]
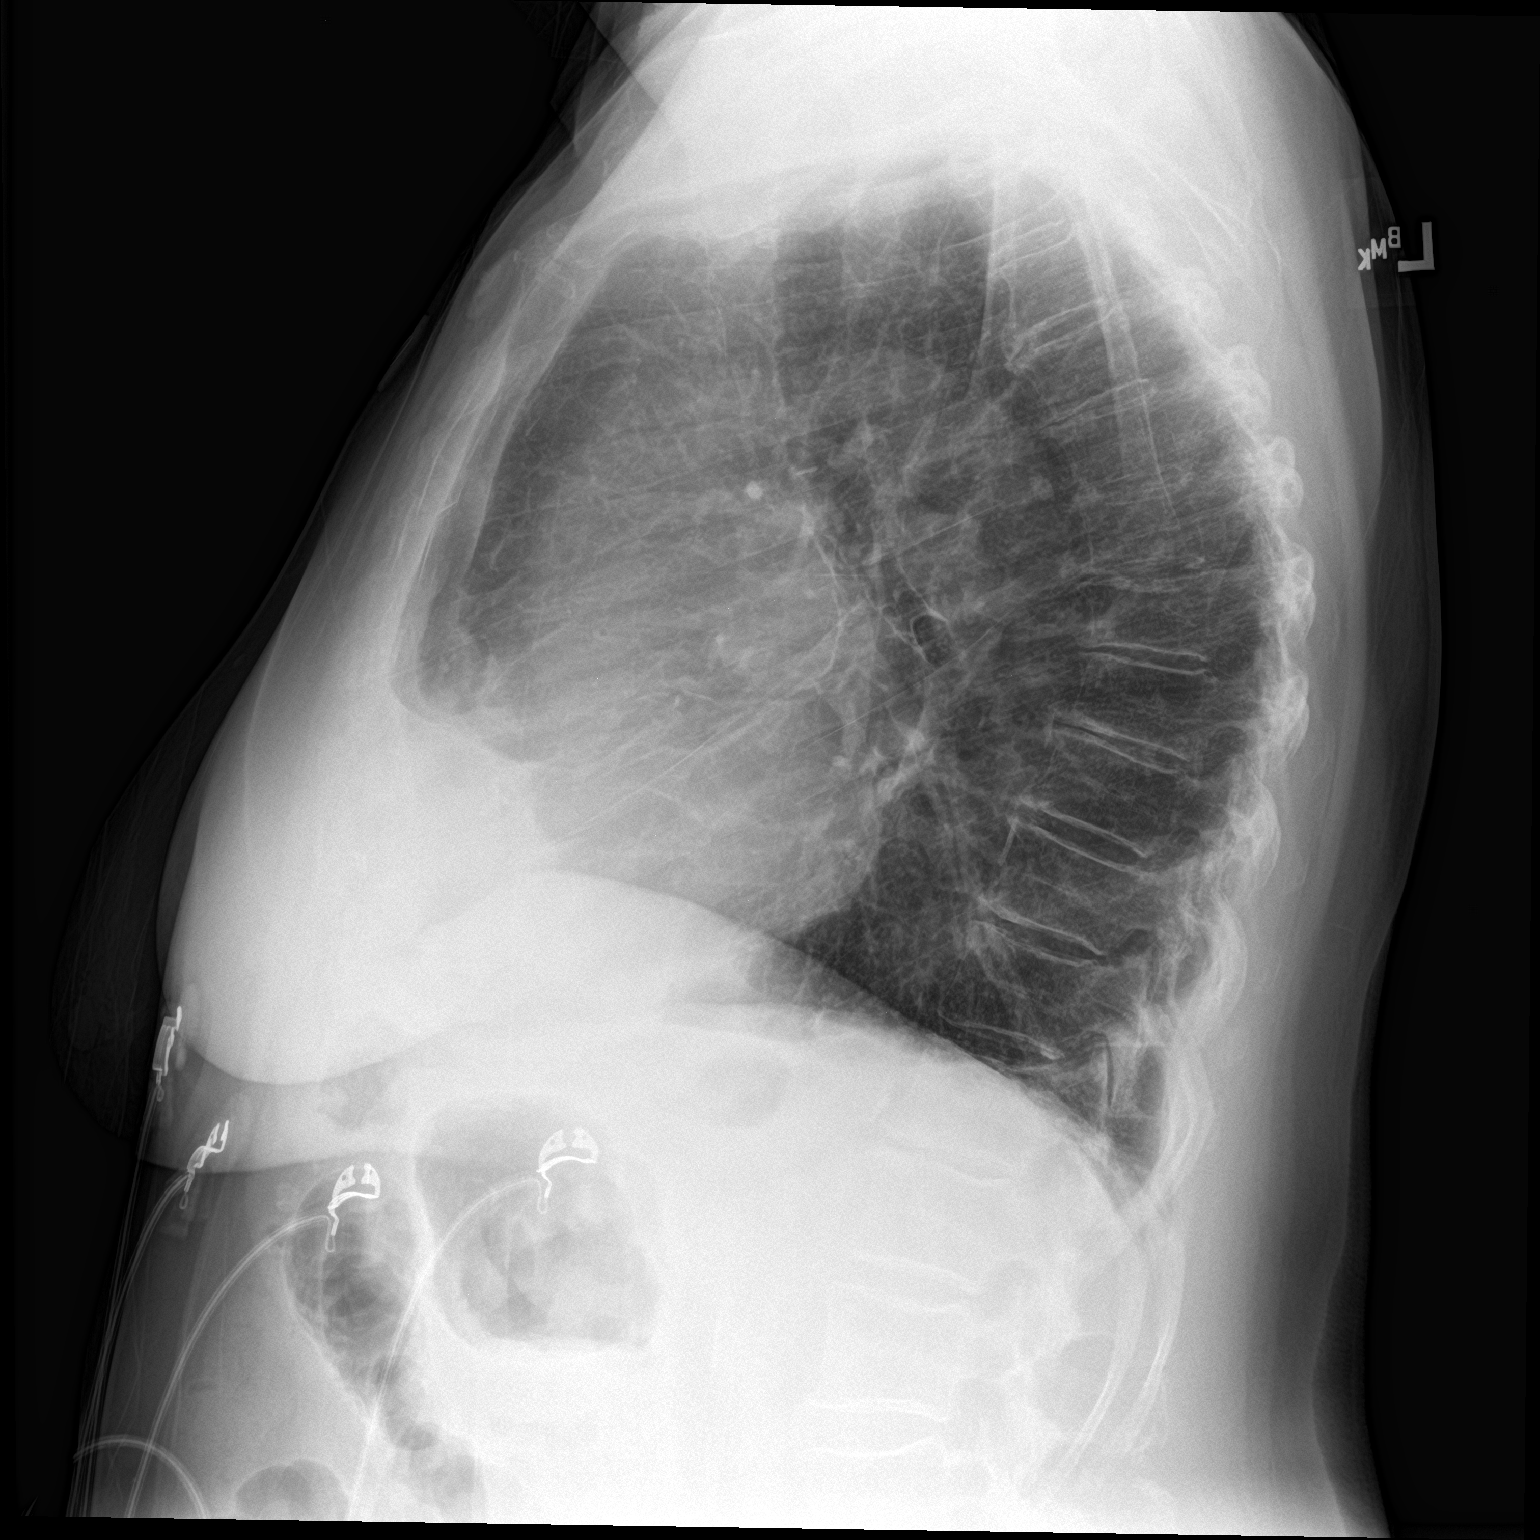

[chest pa]
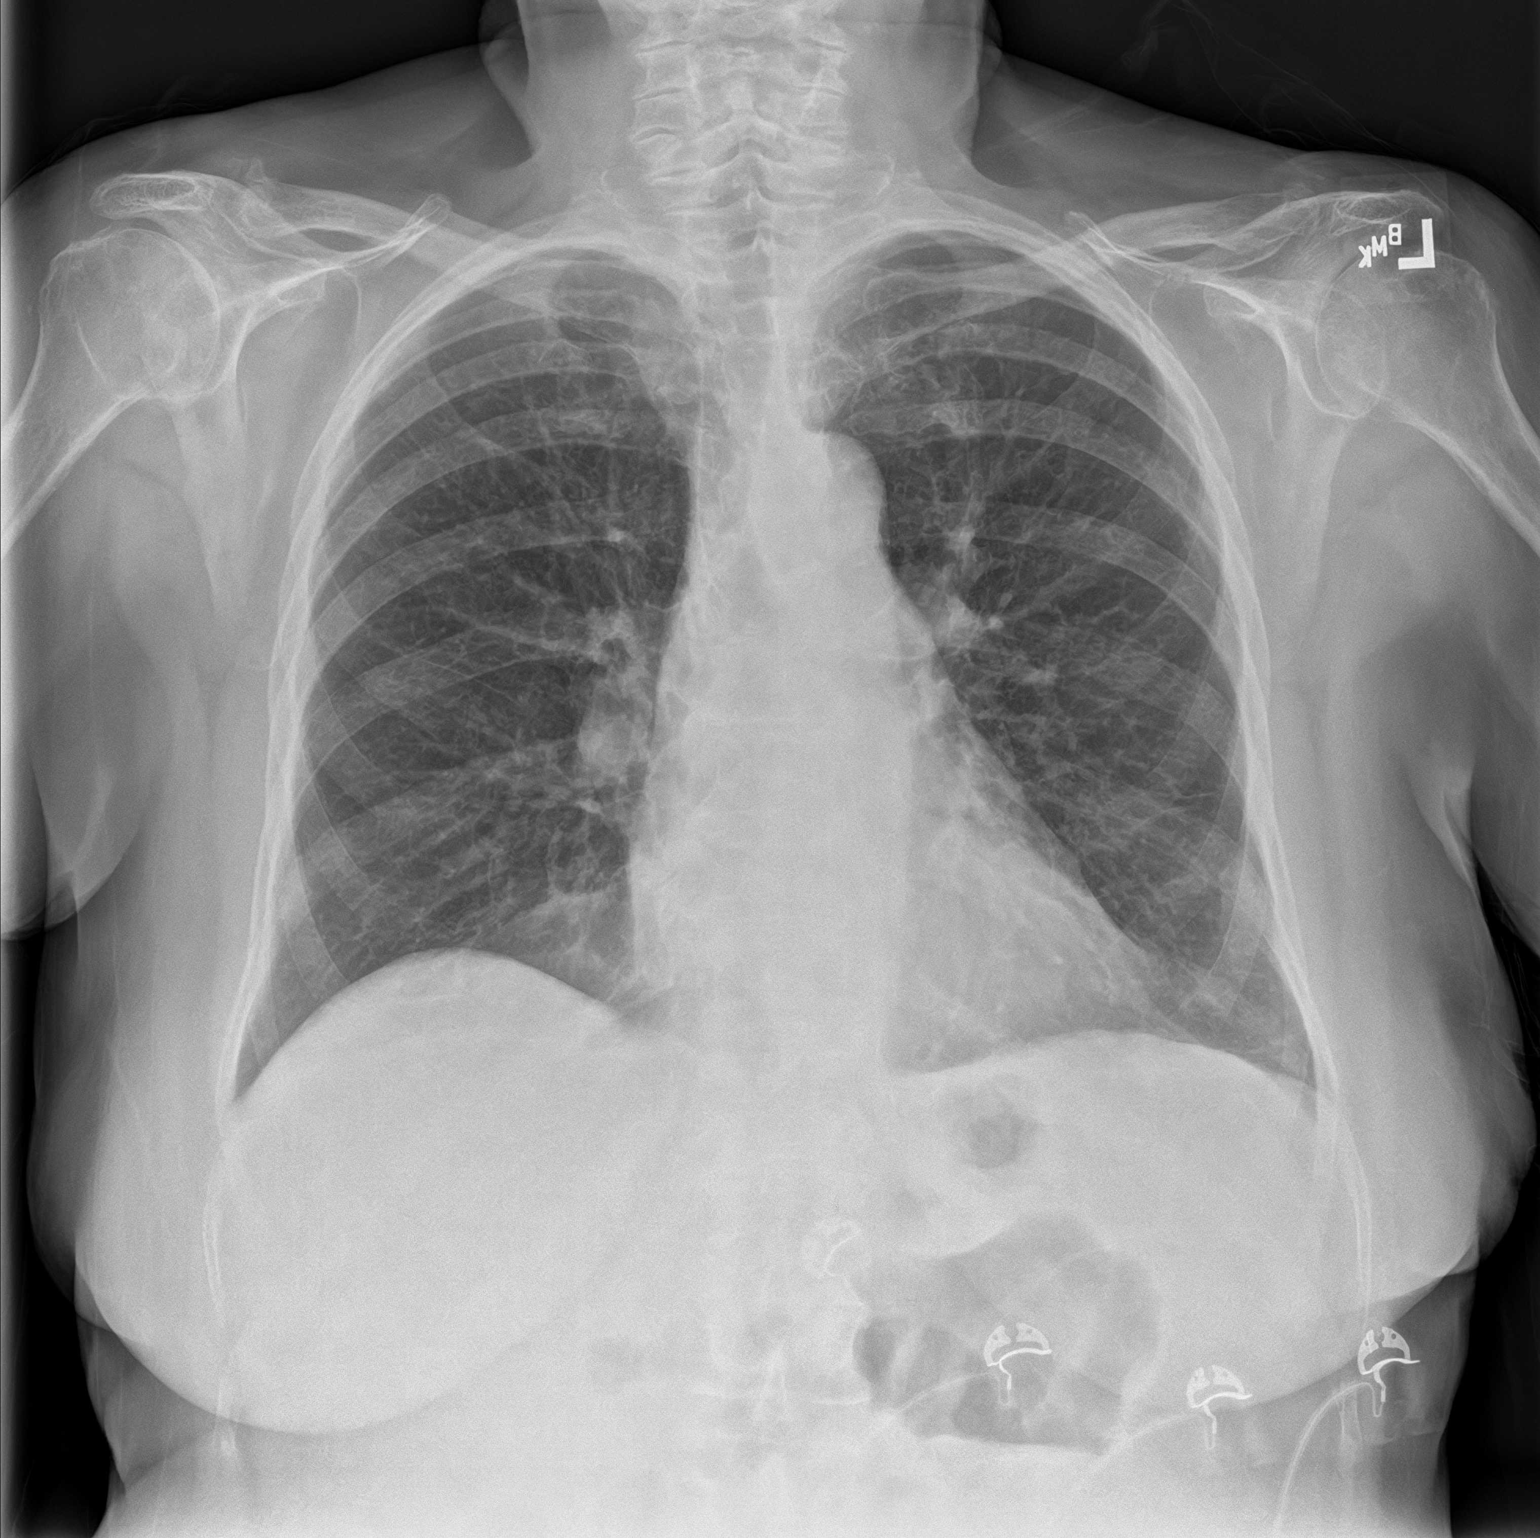

[2 of 2 positions shown; findings below may reference images not displayed]

FINDINGS: Mild diffuse interstitial coarsening. No focal consolidation,
pleural effusion, or pneumothorax. The cardiac silhouette is within
normal limits. No acute osseous pathology.
IMPRESSION: No active cardiopulmonary disease.

## 2018-01-11 ENCOUNTER — Other Ambulatory Visit: Payer: Self-pay | Admitting: Physical Medicine and Rehabilitation

## 2018-01-11 DIAGNOSIS — M5442 Lumbago with sciatica, left side: Principal | ICD-10-CM

## 2018-01-11 DIAGNOSIS — G8929 Other chronic pain: Secondary | ICD-10-CM

## 2018-01-11 DIAGNOSIS — M5441 Lumbago with sciatica, right side: Principal | ICD-10-CM

## 2018-01-15 ENCOUNTER — Other Ambulatory Visit: Payer: Self-pay | Admitting: Neurology

## 2018-01-15 ENCOUNTER — Other Ambulatory Visit: Payer: Self-pay | Admitting: Family Medicine

## 2018-01-15 DIAGNOSIS — R2689 Other abnormalities of gait and mobility: Secondary | ICD-10-CM

## 2018-01-15 DIAGNOSIS — G8929 Other chronic pain: Secondary | ICD-10-CM

## 2018-01-15 DIAGNOSIS — M5442 Lumbago with sciatica, left side: Principal | ICD-10-CM

## 2018-01-15 DIAGNOSIS — E559 Vitamin D deficiency, unspecified: Secondary | ICD-10-CM

## 2018-01-15 DIAGNOSIS — M5441 Lumbago with sciatica, right side: Principal | ICD-10-CM

## 2018-01-21 ENCOUNTER — Ambulatory Visit
Admission: RE | Admit: 2018-01-21 | Discharge: 2018-01-21 | Disposition: A | Discharge status: Home / Self Care | Payer: Medicare Other | Source: Ambulatory Visit | Attending: Neurology | Admitting: Neurology

## 2018-01-21 ENCOUNTER — Ambulatory Visit
Admission: RE | Admit: 2018-01-21 | Discharge: 2018-01-21 | Disposition: A | Discharge status: Home / Self Care | Payer: Medicare Other | Source: Ambulatory Visit | Attending: Physical Medicine and Rehabilitation | Admitting: Physical Medicine and Rehabilitation

## 2018-01-21 DIAGNOSIS — G8929 Other chronic pain: Secondary | ICD-10-CM

## 2018-01-21 DIAGNOSIS — R2689 Other abnormalities of gait and mobility: Secondary | ICD-10-CM

## 2018-01-21 DIAGNOSIS — E559 Vitamin D deficiency, unspecified: Secondary | ICD-10-CM

## 2018-01-21 DIAGNOSIS — M5442 Lumbago with sciatica, left side: Principal | ICD-10-CM

## 2018-01-21 DIAGNOSIS — M5441 Lumbago with sciatica, right side: Principal | ICD-10-CM

## 2018-02-22 ENCOUNTER — Ambulatory Visit: Payer: Medicare Other | Attending: Sports Medicine

## 2018-02-22 ENCOUNTER — Other Ambulatory Visit: Payer: Self-pay

## 2018-02-22 DIAGNOSIS — M6281 Muscle weakness (generalized): Secondary | ICD-10-CM | POA: Diagnosis present

## 2018-02-22 DIAGNOSIS — R2681 Unsteadiness on feet: Secondary | ICD-10-CM | POA: Diagnosis not present

## 2018-02-22 DIAGNOSIS — R262 Difficulty in walking, not elsewhere classified: Secondary | ICD-10-CM | POA: Insufficient documentation

## 2018-02-22 NOTE — Therapy (Addendum)
[facility] MAIN [facility] SERVICES [address] [place], [place], [address] Phone: [phone]   Fax:  [phone]  Physical Therapy Evaluation  Patient Details  Name: Loretta Webb MRN: [id number] Date of Birth: 24-Dec-1931 Referring Provider (PT): Cristopher Peru   Encounter Date: 02/22/2018  PT End of Session - 02/22/18 0952    Visit Number  1    Number of Visits  16    Authorization Type  PN 1/10 starts 10/17    PT Start Time  0845    PT Stop Time  0935    PT Time Calculation (min)  50 min    Equipment Utilized During Treatment  Gait belt    Activity Tolerance  Patient tolerated treatment well    Behavior During Therapy  WFL for tasks assessed/performed       Past Medical History:  Diagnosis Date  . Arthritis   . Chronic fatigue   . DM2 (diabetes mellitus, type 2) (HCC)   . Esophageal stricture   . Fibromyalgia   . GERD (gastroesophageal reflux disease)   . Hypertension   . Lumbar stenosis   . Osteoporosis   . Sleep apnea     Past Surgical History:  Procedure Laterality Date  . ABDOMINAL HYSTERECTOMY    . APPENDECTOMY    . knee replacements    . ROTATOR CUFF REPAIR      There were no vitals filed for this visit.    Subjective Assessment - 02/22/18 0948    Subjective  Patient is a pleasant 84 year old female who states she didn't know her balance was not good. States she has had recent falls.     Pertinent History  Patient reports her daughter wanted her to come to therapy because she has difficulty standing and reaching across body. Patient states she has fallen 3 times and rolled off the bed once. States she scraped her left arm during one of the falls. Patient completed silver sneakers for approximately 8 months following discharge from therapy approximately a year ago, but it became too hard on the shoulders. States she wants to find something else to do after discharge from therapy. States she does not have pain but is on  a pain regimen. Reports she has not been walking right now because of the recent falls and she is being very careful.  States she would like to be able to walk better while turning her head and have improved endurance..    Limitations  Lifting;Standing;Walking;House hold activities    How long can you sit comfortably?  n/a    How long can you stand comfortably?  feels unsteady when initially standing    How long can you walk comfortably?  cannot walk longer distances now    Patient Stated Goals  improve stability with walking and walk better    Currently in Pain?  No/denies        PAIN: 0/10 pain at rest. Controlled with medication  STRENGTH:  Graded on a 0-5 scale Muscle Group Left Right  Hip Flex 4-/5 4-/5  Hip Abd 3+/5 3+/5  Hip Add 4/5 4-/5  Knee Flex 4/5 4-/5  Knee Ext 4/5 4-/5  Ankle DF 4/5 4/5  Ankle PF 4/5 4/5   SENSATION: R LE lighter sensation L4 dermatome pattern along medial malleolus  Reports of numbness/tingling of bilateral feet  SPECIAL TESTS: Motor coordination: heel to shin- WNL  FUNCTIONAL MOBILITY: 5xsts: 10 seconds without use of hands   BALANCE:  Standing airex pad eyes open: without UE support, mild increase in sway. Increased weight shift on LLE  Standing airex pad eye closed: required use of bars due and kept eyes open due to decreased confidence.   Single leg stance: LLE 3 seconds RLE 2seconds ; reaching of UE for stability due to fear of LOB   Static Sitting Balance  Normal Able to maintain balance against maximal resistance   Good Able to maintain balance against moderate resistance   Good-/Fair+ Accepts minimal resistance x  Fair Able to sit unsupported without balance loss and without UE support   Poor+ Able to maintain with Minimal assistance from individual or chair   Poor Unable to maintain balance-requires mod/max support from individual or chair    Static Standing Balance  Normal Able to maintain standing balance against maximal  resistance   Good Able to maintain standing balance against moderate resistance   Good-/Fair+ Able to maintain standing balance against minimal resistance   Fair Able to stand unsupported without UE support and without LOB for 1-2 min x  Fair- Requires Min A and UE support to maintain standing without loss of balance   Poor+ Requires mod A and UE support to maintain standing without loss of balance   Poor Requires max A and UE support to maintain standing balance without loss    Dynamic Sitting Balance  Normal Able to sit unsupported and weight shift across midline maximally   Good Able to sit unsupported and weight shift across midline moderately   Good-/Fair+ Able to sit unsupported and weight shift across midline minimally x  Fair Minimal weight shifting ipsilateral/front, difficulty crossing midline   Fair- Reach to ipsilateral side and unable to weight shift   Poor + Able to sit unsupported with min A and reach to ipsilateral side, unable to weight shift   Poor Able to sit unsupported with mod A and reach ipsilateral/front-can't cross midline     Standing Dynamic Balance  Normal Stand independently unsupported, able to weight shift and cross midline maximally   Good Stand independently unsupported, able to weight shift and cross midline moderately   Good-/Fair+ Stand independently unsupported, able to weight shift across midline minimally   Fair Stand independently unsupported, weight shift, and reach ipsilaterally, loss of balance when crossing midline x  Poor+ Able to stand with Min A and reach ipsilaterally, unable to weight shift   Poor Able to stand with Mod A and minimally reach ipsilaterally, unable to cross midline.      GAIT:  - 13seconds 13m/s; without AD, decreased bilateral step length, decreased hip extension, increased sway/ unsteadiness; limited push off of bilateral LE  OUTCOME MEASURES: TEST Outcome Interpretation  5 times sit<>stand 10sec Without use of  hands WFL  10 meter walk test           13 sec   0.77  m/s <1.0 m/s indicates increased risk for falls; limited community ambulator  DGI 13/24 41-60% impaired  ABC Scale 58.4% Fear of mobility   Berg Balance Assessment 40/56 <36/56 (100% risk for falls), 37-45 (80% risk for falls); 46-51 (>50% risk for falls); 52-55 (lower risk <25% of falls)      This patient presents with 3, personal factors/ comorbidities, and 4  body elements including body structures and functions, activity limitations and or participation restrictions. Patient's condition is evolving    [facility] PT Assessment - 02/22/18 0851      Assessment   Medical Diagnosis  Imbalance  Referring Provider (PT)  Hemang Sherryll Burger    Onset Date/Surgical Date  07/23/17   falls began to get worse   Hand Dominance  Right    Next MD Visit  pain doctor next week    Prior Therapy  Yes   approximately a year ago for LBP     Precautions   Precautions  None      Restrictions   Weight Bearing Restrictions  No      Balance Screen   Has the patient fallen in the past 6 months  Yes    How many times?  3    Has the patient had a decrease in activity level because of a fear of falling?   Yes    Is the patient reluctant to leave their home because of a fear of falling?   Yes      Home Environment   Living Environment  Private residence    Living Arrangements  Alone    Available Help at Discharge  Other (Comment)   not much family support near by   Type of Home  House    Home Access  Stairs to enter    Entrance Stairs-Number of Steps  4    Entrance Stairs-Rails  Right    Home Layout  One level    Home Equipment  Walker - standard;Cane - single point;Grab bars - tub/shower;Grab bars - toilet;Shower seat      Prior Function   Level of Independence  Independent with basic ADLs   needs assistance with grocery shopping   Vocation  Retired      IT consultant   Overall Cognitive Status  History of cognitive impairments - at baseline   hx of  memory deficits     Standardized Balance Assessment   Standardized Balance Assessment  Berg Balance Test;Dynamic Gait Index      Berg Balance Test   Sit to Stand  Able to stand without using hands and stabilize independently    Standing Unsupported  Able to stand safely 2 minutes    Sitting with Back Unsupported but Feet Supported on Floor or Stool  Able to sit safely and securely 2 minutes    Stand to Sit  Sits safely with minimal use of hands    Transfers  Able to transfer safely, minor use of hands    Standing Unsupported with Eyes Closed  Able to stand 3 seconds    Standing Ubsupported with Feet Together  Needs help to attain position but able to stand for 30 seconds with feet together    From Standing, Reach Forward with Outstretched Arm  Can reach confidently >25 cm (10")    From Standing Position, Pick up Object from Floor  Able to pick up shoe safely and easily    From Standing Position, Turn to Look Behind Over each Shoulder  Looks behind one side only/other side shows less weight shift    Turn 360 Degrees  Able to turn 360 degrees safely but slowly    Standing Unsupported, Alternately Place Feet on Step/Stool  Able to complete >2 steps/needs minimal assist    Standing Unsupported, One Foot in Front  Able to take small step independently and hold 30 seconds    Standing on One Leg  Tries to lift leg/unable to hold 3 seconds but remains standing independently    Total Score  40      Dynamic Gait Index   Level Surface  Mild Impairment    Change in  Gait Speed  Mild Impairment    Gait with Horizontal Head Turns  Moderate Impairment    Gait with Vertical Head Turns  Mild Impairment    Gait and Pivot Turn  Mild Impairment    Step Over Obstacle  Severe Impairment    Step Around Obstacles  Mild Impairment    Steps  Mild Impairment    Total Score  13                  Objective measurements completed on examination: See above findings.              PT Education  - 02/22/18 [phone]    Education provided  Yes    Education Details  POC, goals    Person(s) Educated  Patient    Methods  Explanation;Demonstration;Verbal cues    Comprehension  Verbalized understanding;Returned demonstration;Need further instruction       PT Short Term Goals - 02/22/18 1003      PT SHORT TERM GOAL #1   Title  Patient will be independent with completion of HEP to improve ability to complete functional tasks.    Baseline  10/17: will give next session    Time  2    Period  Weeks    Status  New    Target Date  03/08/18      PT SHORT TERM GOAL #2   Title  Patient will report no falls in 2 weeks demonstrating increased stability with mobility.     Baseline  10/17: has had falls    Time  2    Period  Weeks    Status  New    Target Date  03/08/18        PT Long Term Goals - 02/22/18 1004      PT LONG TERM GOAL #1   Title  Patient will demonstrate an improved Berg Balance Score of >46/56 for increased balance with ADLs such as standing and reaching across midline and reduce fall risk.    Baseline  10/17: 40/56    Time  8    Period  Weeks    Status  New    Target Date  04/19/18      PT LONG TERM GOAL #2   Title  Patient will improve ABC score to >67% to decrease risk for falls.    Baseline  10/17: 58.4%    Time  8    Period  Weeks    Status  New    Target Date  04/19/18      PT LONG TERM GOAL #3   Title  Patient will improve DGI score to 16/24 to demonstrate improved dynamic gait and balance and ability to walk in community.     Baseline  10/17: 13/24    Time  8    Period  Weeks    Status  New    Target Date  04/19/18      PT LONG TERM GOAL #4   Title  Patient will improve to >1.27m/s to improve community ambulation and decrease fall risk.     Baseline  10/17: 0.79m/s with no AD    Time  8    Period  Weeks    Status  New    Target Date  04/19/18      PT LONG TERM GOAL #5   Title  Patient will achieve bilateral single leg stance >5 seconds to  demonstrate improved balance and strength on each extremity.  Baseline  10/17: LLE 3seconds; RLE 2 seconds     Time  8    Period  Weeks    Status  New    Target Date  04/19/18             Plan - 02/22/18 0957    Clinical Impression Statement  Patient presents to physical therapy with balance difficulty and recent falls. Patient challenged with static and dynamic balance tasks evident from BERG and DGI scores indicating an increased fall risk. Patient also has mild LE weakness with preference to stand and weight shift on LLE. Patient has decreased confidence and comfort level with ambulating tasks with an ABC score of 58.5% and hesitancy to close eyes with activities during BERG and on airex pad. Patient will benefit from skilled physical therapy to improve strength, balance, and functional mobility to decrease fall risk.    History and Personal Factors relevant to plan of care:  This patient presents with 3, personal factors/ comorbidities, and 4  body elements including body structures and functions, activity limitations and or participation restrictions. Patient's condition is evolving    Clinical Presentation  Evolving    Clinical Presentation due to:  recent increase in falls, decrease in activity     Clinical Decision Making  Moderate    Rehab Potential  Good    Clinical Impairments Affecting Rehab Potential  (+)decreased pain levels, higher PLOF (-) multiple falls, age, memory     PT Frequency  2x / week    PT Duration  8 weeks    PT Treatment/Interventions  ADLs/Self Care Home Management;Therapeutic activities;Therapeutic exercise;Balance training;Neuromuscular re-education;Patient/family education;Manual techniques;Taping;Energy conservation;Cryotherapy;Moist Heat;Electrical Stimulation;Gait training;Stair training;Functional mobility training;DME Instruction;Aquatic Therapy;Ultrasound;Traction;Passive range of motion;Vestibular    PT Next Visit Plan  create HEP     PT Home  Exercise Plan  will give next session    Consulted and Agree with Plan of Care  Patient       Patient will benefit from skilled therapeutic intervention in order to improve the following deficits and impairments:  Abnormal gait, Decreased activity tolerance, Decreased balance, Decreased endurance, Decreased safety awareness, Decreased strength, Difficulty walking, Impaired sensation, Postural dysfunction, Pain, Improper body mechanics, Decreased mobility, Decreased knowledge of use of DME, Impaired perceived functional ability  Visit Diagnosis: Unsteadiness on feet  Difficulty walking  Muscle weakness (generalized)     Problem List Patient Active Problem List   Diagnosis Date Noted  . Cephalalgia 07/24/2015  . Neuritis or radiculitis due to rupture of lumbar intervertebral disc 01/05/2015  . Degeneration of intervertebral disc of cervical region 04/14/2014  . B12 deficiency 12/23/2013  . Benign essential HTN 12/23/2013  . Type 2 diabetes mellitus (HCC) 12/23/2013  . Obstructive apnea 12/23/2013  . Lumbar canal stenosis 09/18/2013   Vanessa Ralphs, SPT  This entire session was performed under direct supervision and direction of a licensed therapist/therapist assistant . I have personally read, edited and approve of the note as written.  Precious Bard, PT, DPT   02/22/2018, 3:21 PM  [facility] MAIN [facility] SERVICES [address] [place], [place], [address] Phone: [phone]   Fax:  [phone]  Name: Loretta Webb MRN: [id number] Date of Birth: May 22, 1931

## 2018-03-01 ENCOUNTER — Ambulatory Visit: Payer: Medicare Other

## 2018-03-01 DIAGNOSIS — M6281 Muscle weakness (generalized): Secondary | ICD-10-CM

## 2018-03-01 DIAGNOSIS — R2681 Unsteadiness on feet: Secondary | ICD-10-CM | POA: Diagnosis not present

## 2018-03-01 DIAGNOSIS — R262 Difficulty in walking, not elsewhere classified: Secondary | ICD-10-CM

## 2018-03-01 NOTE — Therapy (Signed)
[facility] MAIN [facility] SERVICES [address] [place], [place], [address] Phone: [phone]   Fax:  [phone]  Physical Therapy Treatment  Patient Details  Name: Loretta Webb MRN: [id number] Date of Birth: [birth year] Referring Provider (PT): Cristopher Peru   Encounter Date: 03/01/2018  PT End of Session - 03/01/18 0749    Visit Number  2    Number of Visits  16    Authorization Type  PN 2/10 starts 10/17    PT Start Time  0756    PT Stop Time  0841    PT Time Calculation (min)  45 min    Equipment Utilized During Treatment  Gait belt    Activity Tolerance  Patient tolerated treatment well    Behavior During Therapy  [facility] for tasks assessed/performed       Past Medical History:  Diagnosis Date  . Arthritis   . Chronic fatigue   . DM2 (diabetes mellitus, type 2) (HCC)   . Esophageal stricture   . Fibromyalgia   . GERD (gastroesophageal reflux disease)   . Hypertension   . Lumbar stenosis   . Osteoporosis   . Sleep apnea     Past Surgical History:  Procedure Laterality Date  . ABDOMINAL HYSTERECTOMY    . APPENDECTOMY    . knee replacements    . ROTATOR CUFF REPAIR      There were no vitals filed for this visit.  Subjective Assessment - 03/01/18 0757    Subjective  Patient reports she has had no stumbles or falls. Is on pain medication so isn't in pain at this moment.     Pertinent History  Patient reports her daughter wanted her to come to therapy because she has difficulty standing and reaching across body. Patient states she has fallen 3 times and rolled off the bed once. States she scraped her left arm during one of the falls. Patient completed silver sneakers for approximately 8 months following discharge from therapy approximately a year ago, but it became too hard on the shoulders. States she wants to find something else to do after discharge from therapy. States she does not have pain but is on a pain regimen. Reports she  has not been walking right now because of the recent falls and she is being very careful.  States she would like to be able to walk better while turning her head and have improved endurance..    Limitations  Lifting;Standing;Walking;House hold activities    How long can you sit comfortably?  n/a    How long can you stand comfortably?  feels unsteady when initially standing    How long can you walk comfortably?  cannot walk longer distances now    Patient Stated Goals  improve stability with walking and walk better    Currently in Pain?  No/denies      review new HEP  Access Code: [email or web address]  URL: [email or web address]  Date: 03/01/2018  Prepared by: Precious Bard   Exercises  Standing Weight Shift Side to Side - 10 reps - 1 sets - 3 hold - 1x daily - 7x weekly  Standing March with Counter Support - 10 reps - 2 sets - 1x daily - 7x weekly  Seated Toe Raise - 10 reps - 2 sets - 1x daily - 7x weekly  Seated Hip Adduction Isometrics with Ball - 10 reps - 1 sets - 1x daily - 7x weekly  Tandem Stance with Chair Support - 10 reps -  1 sets - 10 hold - 1x daily - 7x weekly   Airex pad: static stand with horizontal head turns 2x 30 seconds, vertical head turns 2x 30 seconds; dizzy with vertical head turns; very fearful   Airex pad: 6" step toe taps no UE support, occasional use of UE's for stability 10x each LE  Airex pad: 6" step lateral toe taps SUE support 12x each LE  Unable to perform standing hip abduction  Tandem line walk in // bars 4x length of bars CGA ; SUE support   Ambulate in hallway with horizontal head turns 2x 86 ft with CGA. ; frequent LOB requiring Pt to hold onto patient.   Standing step and heel strike then step back 10x each LE.   Ambulate in // bars with heel strike 4x length of bars with SUE support   Seated: RTB hip abduction one LE at a time 12x each LAQ 12x each LE RTB df 10x each LE                               PT  Education - 03/01/18 0748    Education provided  Yes    Education Details  exercise technique, stablity, HEP     Person(s) Educated  Patient    Methods  Demonstration;Explanation;Tactile cues;Verbal cues;Handout    Comprehension  Verbalized understanding;Returned demonstration;Need further instruction       PT Short Term Goals - 02/22/18 1003      PT SHORT TERM GOAL #1   Title  Patient will be independent with completion of HEP to improve ability to complete functional tasks.    Baseline  10/17: will give next session    Time  2    Period  Weeks    Status  New    Target Date  03/08/18      PT SHORT TERM GOAL #2   Title  Patient will report no falls in 2 weeks demonstrating increased stability with mobility.     Baseline  10/17: has had falls    Time  2    Period  Weeks    Status  New    Target Date  03/08/18        PT Long Term Goals - 02/22/18 1004      PT LONG TERM GOAL #1   Title  Patient will demonstrate an improved Berg Balance Score of >46/56 for increased balance with ADLs such as standing and reaching across midline and reduce fall risk.    Baseline  10/17: 40/56    Time  8    Period  Weeks    Status  New    Target Date  04/19/18      PT LONG TERM GOAL #2   Title  Patient will improve ABC score to >67% to decrease risk for falls.    Baseline  10/17: 58.4%    Time  8    Period  Weeks    Status  New    Target Date  04/19/18      PT LONG TERM GOAL #3   Title  Patient will improve DGI score to 16/24 to demonstrate improved dynamic gait and balance and ability to walk in community.     Baseline  10/17: 13/24    Time  8    Period  Weeks    Status  New    Target Date  04/19/18  PT LONG TERM GOAL #4   Title  Patient will improve to >1.91m/s to improve community ambulation and decrease fall risk.     Baseline  10/17: 0.25m/s with no AD    Time  8    Period  Weeks    Status  New    Target Date  04/19/18      PT LONG TERM GOAL #5   Title  Patient  will achieve bilateral single leg stance >5 seconds to demonstrate improved balance and strength on each extremity.     Baseline  10/17: LLE 3seconds; RLE 2 seconds     Time  8    Period  Weeks    Status  New    Target Date  04/19/18            Plan - 03/01/18 0839    Clinical Impression Statement  Patient challenged with single limb stance interventions due to limited stability and strength. Unable to perform standing abduction without toe out indicating weak abduction musculature with flexion musculature compensating. Patient ambulates with foot flat/foot slap pattern limiting stability. Patient will benefit from skilled physical therapy to improve strength, balance, and functional mobility to decrease fall risk.    Rehab Potential  Good    Clinical Impairments Affecting Rehab Potential  (+)decreased pain levels, higher PLOF (-) multiple falls, age, memory     PT Frequency  2x / week    PT Duration  8 weeks    PT Treatment/Interventions  ADLs/Self Care Home Management;Therapeutic activities;Therapeutic exercise;Balance training;Neuromuscular re-education;Patient/family education;Manual techniques;Taping;Energy conservation;Cryotherapy;Moist Heat;Electrical Stimulation;Gait training;Stair training;Functional mobility training;DME Instruction;Aquatic Therapy;Ultrasound;Traction;Passive range of motion;Vestibular    PT Next Visit Plan  turns    PT Home Exercise Plan  will give next session    Consulted and Agree with Plan of Care  Patient       Patient will benefit from skilled therapeutic intervention in order to improve the following deficits and impairments:  Abnormal gait, Decreased activity tolerance, Decreased balance, Decreased endurance, Decreased safety awareness, Decreased strength, Difficulty walking, Impaired sensation, Postural dysfunction, Pain, Improper body mechanics, Decreased mobility, Decreased knowledge of use of DME, Impaired perceived functional ability  Visit  Diagnosis: Unsteadiness on feet  Difficulty walking  Muscle weakness (generalized)     Problem List Patient Active Problem List   Diagnosis Date Noted  . Cephalalgia 07/24/2015  . Neuritis or radiculitis due to rupture of lumbar intervertebral disc 01/05/2015  . Degeneration of intervertebral disc of cervical region 04/14/2014  . B12 deficiency 12/23/2013  . Benign essential HTN 12/23/2013  . Type 2 diabetes mellitus (HCC) 12/23/2013  . Obstructive apnea 12/23/2013  . Lumbar canal stenosis 09/18/2013   Precious Bard, PT, DPT   03/01/2018, 8:42 AM  [facility] MAIN [facility] SERVICES [address] [place], [place], [address] Phone: [phone]   Fax:  [phone]  Name: Loretta Webb MRN: [id number] Date of Birth: 01/06/1932

## 2018-03-06 ENCOUNTER — Ambulatory Visit: Payer: Medicare Other

## 2018-03-06 DIAGNOSIS — R262 Difficulty in walking, not elsewhere classified: Secondary | ICD-10-CM

## 2018-03-06 DIAGNOSIS — R2681 Unsteadiness on feet: Secondary | ICD-10-CM | POA: Diagnosis not present

## 2018-03-06 DIAGNOSIS — M6281 Muscle weakness (generalized): Secondary | ICD-10-CM

## 2018-03-06 NOTE — Therapy (Signed)
[facility] MAIN [facility] SERVICES [address] [place], [place], [address] Phone: [phone]   Fax:  [phone]  Physical Therapy Treatment  Patient Details  Name: Loretta Webb MRN: [id number] Date of Birth: 1931-08-25 Referring Provider (PT): Cristopher Peru   Encounter Date: 03/06/2018  PT End of Session - 03/06/18 0754    Visit Number  3    Number of Visits  16    Authorization Type  PN 3/10 starts 10/17    PT Start Time  0759    PT Stop Time  0844    PT Time Calculation (min)  45 min    Equipment Utilized During Treatment  Gait belt    Activity Tolerance  Patient tolerated treatment well    Behavior During Therapy  [facility] for tasks assessed/performed       Past Medical History:  Diagnosis Date  . Arthritis   . Chronic fatigue   . DM2 (diabetes mellitus, type 2) (HCC)   . Esophageal stricture   . Fibromyalgia   . GERD (gastroesophageal reflux disease)   . Hypertension   . Lumbar stenosis   . Osteoporosis   . Sleep apnea     Past Surgical History:  Procedure Laterality Date  . ABDOMINAL HYSTERECTOMY    . APPENDECTOMY    . knee replacements    . ROTATOR CUFF REPAIR      There were no vitals filed for this visit.  Subjective Assessment - 03/06/18 0831    Subjective  Patient reports no falls or LOB since last session. Has no pain and has been compliant with HEP. Has no plans for halloween.     Pertinent History  Patient reports her daughter wanted her to come to therapy because she has difficulty standing and reaching across body. Patient states she has fallen 3 times and rolled off the bed once. States she scraped her left arm during one of the falls. Patient completed silver sneakers for approximately 8 months following discharge from therapy approximately a year ago, but it became too hard on the shoulders. States she wants to find something else to do after discharge from therapy. States she does not have pain but is on a pain  regimen. Reports she has not been walking right now because of the recent falls and she is being very careful.  States she would like to be able to walk better while turning her head and have improved endurance..    Limitations  Lifting;Standing;Walking;House hold activities    How long can you sit comfortably?  n/a    How long can you stand comfortably?  feels unsteady when initially standing    How long can you walk comfortably?  cannot walk longer distances now    Patient Stated Goals  improve stability with walking and walk better    Currently in Pain?  No/denies      Ambulate around 5 cones with focus on upright posture and stability to reduce episodes of LOB  Airex pad: static stand with horizontal head turns 2x 30 seconds, vertical head turns 2x 30 seconds; dizzy with vertical head turns; very fearful    Airex pad: 6" step step ups SUE support,  10x each LE   Airex pad: 6" step lateral step ups SUE support 15x each LE; decrease UE support to single finger support.    Speed ladder : one foot in each box, SUE support verbal cues for heel strike and widening step length   Tandem  line walk in // bars 4x length of bars CGA ; SUE support   Step over and back orange hurdle 12x each Le, SUE support    Modified single leg stance: opp LE on dyna disc 30 seconds each leg; 2 sets    Single LE raise with opp arm coming to midline x 10 each side in // bars; slow 3 second count up 3 second count down.   Standing balloon taps 2 minutes, reaching inside and outside BOS  Standing heel raises 10x with BUE support   Seated: RTB hip abduction one LE at a time 15x each LAQ 10x each LE; 2 second holds.  RTB df 10x each LE                           PT Education - 03/06/18 0751    Education provided  Yes    Education Details  stability, exercise technique, HEP.     Person(s) Educated  Patient    Methods  Explanation;Demonstration;Verbal cues    Comprehension  Verbalized  understanding;Returned demonstration;Need further instruction       PT Short Term Goals - 02/22/18 1003      PT SHORT TERM GOAL #1   Title  Patient will be independent with completion of HEP to improve ability to complete functional tasks.    Baseline  10/17: will give next session    Time  2    Period  Weeks    Status  New    Target Date  03/08/18      PT SHORT TERM GOAL #2   Title  Patient will report no falls in 2 weeks demonstrating increased stability with mobility.     Baseline  10/17: has had falls    Time  2    Period  Weeks    Status  New    Target Date  03/08/18        PT Long Term Goals - 02/22/18 1004      PT LONG TERM GOAL #1   Title  Patient will demonstrate an improved Berg Balance Score of >46/56 for increased balance with ADLs such as standing and reaching across midline and reduce fall risk.    Baseline  10/17: 40/56    Time  8    Period  Weeks    Status  New    Target Date  04/19/18      PT LONG TERM GOAL #2   Title  Patient will improve ABC score to >67% to decrease risk for falls.    Baseline  10/17: 58.4%    Time  8    Period  Weeks    Status  New    Target Date  04/19/18      PT LONG TERM GOAL #3   Title  Patient will improve DGI score to 16/24 to demonstrate improved dynamic gait and balance and ability to walk in community.     Baseline  10/17: 13/24    Time  8    Period  Weeks    Status  New    Target Date  04/19/18      PT LONG TERM GOAL #4   Title  Patient will improve to >1.109m/s to improve community ambulation and decrease fall risk.     Baseline  10/17: 0.103m/s with no AD    Time  8    Period  Weeks    Status  New  Target Date  04/19/18      PT LONG TERM GOAL #5   Title  Patient will achieve bilateral single leg stance >5 seconds to demonstrate improved balance and strength on each extremity.     Baseline  10/17: LLE 3seconds; RLE 2 seconds     Time  8    Period  Weeks    Status  New    Target Date  04/19/18             Plan - 03/06/18 1610    Clinical Impression Statement  Patient challenged by modified single limb stance resulting in occasional minor LOB when weight shifted onto RLE. RLE weaker than LLE fatiguing quickly resulting in need for UE support. Patient will benefit from skilled physical therapy to improve strength, balance, and functional mobility to decrease fall risk.    Rehab Potential  Good    Clinical Impairments Affecting Rehab Potential  (+)decreased pain levels, higher PLOF (-) multiple falls, age, memory     PT Frequency  2x / week    PT Duration  8 weeks    PT Treatment/Interventions  ADLs/Self Care Home Management;Therapeutic activities;Therapeutic exercise;Balance training;Neuromuscular re-education;Patient/family education;Manual techniques;Taping;Energy conservation;Cryotherapy;Moist Heat;Electrical Stimulation;Gait training;Stair training;Functional mobility training;DME Instruction;Aquatic Therapy;Ultrasound;Traction;Passive range of motion;Vestibular    PT Next Visit Plan  turns    PT Home Exercise Plan  will give next session    Consulted and Agree with Plan of Care  Patient       Patient will benefit from skilled therapeutic intervention in order to improve the following deficits and impairments:  Abnormal gait, Decreased activity tolerance, Decreased balance, Decreased endurance, Decreased safety awareness, Decreased strength, Difficulty walking, Impaired sensation, Postural dysfunction, Pain, Improper body mechanics, Decreased mobility, Decreased knowledge of use of DME, Impaired perceived functional ability  Visit Diagnosis: Unsteadiness on feet  Difficulty walking  Muscle weakness (generalized)     Problem List Patient Active Problem List   Diagnosis Date Noted  . Cephalalgia 07/24/2015  . Neuritis or radiculitis due to rupture of lumbar intervertebral disc 01/05/2015  . Degeneration of intervertebral disc of cervical region 04/14/2014  . B12  deficiency 12/23/2013  . Benign essential HTN 12/23/2013  . Type 2 diabetes mellitus (HCC) 12/23/2013  . Obstructive apnea 12/23/2013  . Lumbar canal stenosis 09/18/2013   Precious Bard, PT, DPT   03/06/2018, 8:50 AM  [facility] MAIN [facility] SERVICES [address] [place], [place], [address] Phone: [phone]   Fax:  [phone]  Name: Loretta Webb MRN: [id number] Date of Birth: [birth year]

## 2018-03-08 ENCOUNTER — Ambulatory Visit: Payer: Medicare Other

## 2018-03-08 DIAGNOSIS — R262 Difficulty in walking, not elsewhere classified: Secondary | ICD-10-CM

## 2018-03-08 DIAGNOSIS — M6281 Muscle weakness (generalized): Secondary | ICD-10-CM

## 2018-03-08 DIAGNOSIS — R2681 Unsteadiness on feet: Secondary | ICD-10-CM

## 2018-03-08 NOTE — Therapy (Signed)
[facility] MAIN [facility] SERVICES [address] [place], [place], [address] Phone: [phone]   Fax:  [phone]  Physical Therapy Treatment  Patient Details  Name: Loretta Webb MRN: [id number] Date of Birth: 1932/04/25 Referring Provider (PT): Cristopher Peru   Encounter Date: 03/08/2018  PT End of Session - 03/08/18 0805    Visit Number  4    Number of Visits  16    Date for PT Re-Evaluation  04/19/18    Authorization Type  PN 3/10 starts 10/17    PT Start Time  0800    PT Stop Time  0845    PT Time Calculation (min)  45 min    Equipment Utilized During Treatment  Gait belt    Activity Tolerance  Patient tolerated treatment well    Behavior During Therapy  WFL for tasks assessed/performed       Past Medical History:  Diagnosis Date  . Arthritis   . Chronic fatigue   . DM2 (diabetes mellitus, type 2) (HCC)   . Esophageal stricture   . Fibromyalgia   . GERD (gastroesophageal reflux disease)   . Hypertension   . Lumbar stenosis   . Osteoporosis   . Sleep apnea     Past Surgical History:  Procedure Laterality Date  . ABDOMINAL HYSTERECTOMY    . APPENDECTOMY    . knee replacements    . ROTATOR CUFF REPAIR      There were no vitals filed for this visit.  Subjective Assessment - 03/08/18 0804    Subjective  Patient reports no falls or pain today. Reports some fatigue due to the weather.     Pertinent History  Patient reports her daughter wanted her to come to therapy because she has difficulty standing and reaching across body. Patient states she has fallen 3 times and rolled off the bed once. States she scraped her left arm during one of the falls. Patient completed silver sneakers for approximately 8 months following discharge from therapy approximately a year ago, but it became too hard on the shoulders. States she wants to find something else to do after discharge from therapy. States she does not have pain but is on a pain  regimen. Reports she has not been walking right now because of the recent falls and she is being very careful.  States she would like to be able to walk better while turning her head and have improved endurance..    Limitations  Lifting;Standing;Walking;House hold activities    How long can you sit comfortably?  n/a    How long can you stand comfortably?  feels unsteady when initially standing    How long can you walk comfortably?  cannot walk longer distances now    Patient Stated Goals  improve stability with walking and walk better    Currently in Pain?  No/denies          Airex pad: 6" step step ups SUE support,  10x each LE  Airex pad: 6" step lateral step ups SUE support 15x each LE; decrease UE support to single finger support.   Airex balance beam: side step 6x length of beam CGA: frequent LOB requiring occasional UE support.   Airex balance beam; tandem walk with SUE support and CGA   Speed ladder : one foot in each box, SUE support verbal cues for heel strike and widening step length   Step over and back orange hurdle 12x each LE, SUE support  Side step over orange hurdle 12x each LE; SUE to no UE support  Modified single leg stance: opp LE on dyna disc 30 seconds each leg; 2 sets    Standing balloon taps 2 minutes, reaching inside and outside BOS  Standing heel raises 10x with BUE support   Seated: RTB hip abduction one LE at a time 15x each LAQ 10x each LE; 5  second holds.  RTB df 10x each LE adduction ball squeezes 10x 5 second holds                       PT Education - 03/08/18 0805    Education provided  Yes    Education Details  exercise technique, stability     Person(s) Educated  Patient    Methods  Explanation;Demonstration;Verbal cues    Comprehension  Verbalized understanding;Returned demonstration       PT Short Term Goals - 02/22/18 1003      PT SHORT TERM GOAL #1   Title  Patient will be independent with  completion of HEP to improve ability to complete functional tasks.    Baseline  10/17: will give next session    Time  2    Period  Weeks    Status  New    Target Date  03/08/18      PT SHORT TERM GOAL #2   Title  Patient will report no falls in 2 weeks demonstrating increased stability with mobility.     Baseline  10/17: has had falls    Time  2    Period  Weeks    Status  New    Target Date  03/08/18        PT Long Term Goals - 02/22/18 1004      PT LONG TERM GOAL #1   Title  Patient will demonstrate an improved Berg Balance Score of >46/56 for increased balance with ADLs such as standing and reaching across midline and reduce fall risk.    Baseline  10/17: 40/56    Time  8    Period  Weeks    Status  New    Target Date  04/19/18      PT LONG TERM GOAL #2   Title  Patient will improve ABC score to >67% to decrease risk for falls.    Baseline  10/17: 58.4%    Time  8    Period  Weeks    Status  New    Target Date  04/19/18      PT LONG TERM GOAL #3   Title  Patient will improve DGI score to 16/24 to demonstrate improved dynamic gait and balance and ability to walk in community.     Baseline  10/17: 13/24    Time  8    Period  Weeks    Status  New    Target Date  04/19/18      PT LONG TERM GOAL #4   Title  Patient will improve to >1.28m/s to improve community ambulation and decrease fall risk.     Baseline  10/17: 0.2m/s with no AD    Time  8    Period  Weeks    Status  New    Target Date  04/19/18      PT LONG TERM GOAL #5   Title  Patient will achieve bilateral single leg stance >5 seconds to demonstrate improved balance and strength on each extremity.  Baseline  10/17: LLE 3seconds; RLE 2 seconds     Time  8    Period  Weeks    Status  New    Target Date  04/19/18            Plan - 03/08/18 0831    Clinical Impression Statement  Patient requires use of occasional UE support when ambulating and performing tasks on dynamic surfaces.  Patient challenged with prolonged muscle recruitment of RLE due to fatigue. Patient compensates for limited capacity for activation with foot flat/slap mobility when ambulating. Patient will benefit from skilled physical therapy to improve strength, balance, and functional mobility to decrease fall risk.    Rehab Potential  Good    Clinical Impairments Affecting Rehab Potential  (+)decreased pain levels, higher PLOF (-) multiple falls, age, memory     PT Frequency  2x / week    PT Duration  8 weeks    PT Treatment/Interventions  ADLs/Self Care Home Management;Therapeutic activities;Therapeutic exercise;Balance training;Neuromuscular re-education;Patient/family education;Manual techniques;Taping;Energy conservation;Cryotherapy;Moist Heat;Electrical Stimulation;Gait training;Stair training;Functional mobility training;DME Instruction;Aquatic Therapy;Ultrasound;Traction;Passive range of motion;Vestibular    PT Next Visit Plan  turns    PT Home Exercise Plan  will give next session    Consulted and Agree with Plan of Care  Patient       Patient will benefit from skilled therapeutic intervention in order to improve the following deficits and impairments:  Abnormal gait, Decreased activity tolerance, Decreased balance, Decreased endurance, Decreased safety awareness, Decreased strength, Difficulty walking, Impaired sensation, Postural dysfunction, Pain, Improper body mechanics, Decreased mobility, Decreased knowledge of use of DME, Impaired perceived functional ability  Visit Diagnosis: Unsteadiness on feet  Difficulty walking  Muscle weakness (generalized)     Problem List Patient Active Problem List   Diagnosis Date Noted  . Cephalalgia 07/24/2015  . Neuritis or radiculitis due to rupture of lumbar intervertebral disc 01/05/2015  . Degeneration of intervertebral disc of cervical region 04/14/2014  . B12 deficiency 12/23/2013  . Benign essential HTN 12/23/2013  . Type 2 diabetes mellitus  (HCC) 12/23/2013  . Obstructive apnea 12/23/2013  . Lumbar canal stenosis 09/18/2013   Precious Bard, PT, DPT   03/08/2018, 8:44 AM  [facility] MAIN [facility] SERVICES [address] [place], [place], [address] Phone: [phone]   Fax:  [phone]  Name: Loretta Webb MRN: [id number] Date of Birth: 04-Jan-1932

## 2018-03-13 ENCOUNTER — Ambulatory Visit: Payer: Medicare Other | Attending: Sports Medicine

## 2018-03-13 DIAGNOSIS — R2681 Unsteadiness on feet: Secondary | ICD-10-CM | POA: Insufficient documentation

## 2018-03-13 DIAGNOSIS — M6281 Muscle weakness (generalized): Secondary | ICD-10-CM | POA: Insufficient documentation

## 2018-03-13 DIAGNOSIS — R262 Difficulty in walking, not elsewhere classified: Secondary | ICD-10-CM

## 2018-03-13 NOTE — Therapy (Signed)
[facility] MAIN [facility] SERVICES [address] [place], [place], [address] Phone: [phone]   Fax:  [phone]  Physical Therapy Treatment  Patient Details  Name: Loretta Webb MRN: [id number] Date of Birth: 1932/02/18 Referring Provider (PT): Cristopher Peru   Encounter Date: 03/13/2018  PT End of Session - 03/13/18 0840    Visit Number  5    Number of Visits  16    Date for PT Re-Evaluation  04/19/18    Authorization Type  PN 4/10 starts 10/17    PT Start Time  0846    PT Stop Time  0930    PT Time Calculation (min)  44 min    Equipment Utilized During Treatment  Gait belt    Activity Tolerance  Patient tolerated treatment well    Behavior During Therapy  [facility] for tasks assessed/performed       Past Medical History:  Diagnosis Date  . Arthritis   . Chronic fatigue   . DM2 (diabetes mellitus, type 2) (HCC)   . Esophageal stricture   . Fibromyalgia   . GERD (gastroesophageal reflux disease)   . Hypertension   . Lumbar stenosis   . Osteoporosis   . Sleep apnea     Past Surgical History:  Procedure Laterality Date  . ABDOMINAL HYSTERECTOMY    . APPENDECTOMY    . knee replacements    . ROTATOR CUFF REPAIR      There were no vitals filed for this visit.  Subjective Assessment - 03/13/18 0849    Subjective  Patient reports no stumbles/falls since last session. Reports she still has to be careful, this morning she caught herself due to her right leg feeling a little weaker/unsteady.     Pertinent History  Patient reports her daughter wanted her to come to therapy because she has difficulty standing and reaching across body. Patient states she has fallen 3 times and rolled off the bed once. States she scraped her left arm during one of the falls. Patient completed silver sneakers for approximately 8 months following discharge from therapy approximately a year ago, but it became too hard on the shoulders. States she wants to find  something else to do after discharge from therapy. States she does not have pain but is on a pain regimen. Reports she has not been walking right now because of the recent falls and she is being very careful.  States she would like to be able to walk better while turning her head and have improved endurance..    Limitations  Lifting;Standing;Walking;House hold activities    How long can you sit comfortably?  n/a    How long can you stand comfortably?  feels unsteady when initially standing    How long can you walk comfortably?  cannot walk longer distances now    Patient Stated Goals  improve stability with walking and walk better    Currently in Pain?  No/denies       Airex pad: 6" step step ups SUE support,  10x each LE   Airex pad: 6" step lateral step ups SUE support 15x each LE; decrease UE support to single finger support.    Airex balance beam: side step 6x length of beam CGA: occasional  LOB requiring occasional UE support (4x total)   Airex balance beam; tandem walk with SUE support with occasional decrease to finger tip support and CGA ; 6x length of bars   Step over and back orange hurdle  12x each LE, SUE support    Modified squats with finger tip support in // bars 10x with tactile cues for bringing gluteals back and avoiding excessive knees over toes   RTB side stepping in // bars 2x length of bars  RTB around ankles Straight leg flexion 10x each LE.    Modified single leg stance: opp LE on dyna disc 30 seconds each leg; 2 sets    Stand on half foam roller: round part up: 2x30 seconds CGA     Tandem stance: two airex pads; one foot on each pad x 30 seconds each LE back.    Seated:  adduction ball squeezes 10x 5 second holds  RTB df 15x each LE                           PT Education - 03/13/18 0839    Education provided  Yes    Education Details  exercise technique, stability     Person(s) Educated  Patient    Methods   Explanation;Demonstration;Verbal cues    Comprehension  Verbalized understanding;Returned demonstration       PT Short Term Goals - 02/22/18 1003      PT SHORT TERM GOAL #1   Title  Patient will be independent with completion of HEP to improve ability to complete functional tasks.    Baseline  10/17: will give next session    Time  2    Period  Weeks    Status  New    Target Date  03/08/18      PT SHORT TERM GOAL #2   Title  Patient will report no falls in 2 weeks demonstrating increased stability with mobility.     Baseline  10/17: has had falls    Time  2    Period  Weeks    Status  New    Target Date  03/08/18        PT Long Term Goals - 02/22/18 1004      PT LONG TERM GOAL #1   Title  Patient will demonstrate an improved Berg Balance Score of >46/56 for increased balance with ADLs such as standing and reaching across midline and reduce fall risk.    Baseline  10/17: 40/56    Time  8    Period  Weeks    Status  New    Target Date  04/19/18      PT LONG TERM GOAL #2   Title  Patient will improve ABC score to >67% to decrease risk for falls.    Baseline  10/17: 58.4%    Time  8    Period  Weeks    Status  New    Target Date  04/19/18      PT LONG TERM GOAL #3   Title  Patient will improve DGI score to 16/24 to demonstrate improved dynamic gait and balance and ability to walk in community.     Baseline  10/17: 13/24    Time  8    Period  Weeks    Status  New    Target Date  04/19/18      PT LONG TERM GOAL #4   Title  Patient will improve to >1.57m/s to improve community ambulation and decrease fall risk.     Baseline  10/17: 0.76m/s with no AD    Time  8    Period  Weeks    Status  New    Target Date  04/19/18      PT LONG TERM GOAL #5   Title  Patient will achieve bilateral single leg stance >5 seconds to demonstrate improved balance and strength on each extremity.     Baseline  10/17: LLE 3seconds; RLE 2 seconds     Time  8    Period  Weeks     Status  New    Target Date  04/19/18            Plan - 03/13/18 0914    Clinical Impression Statement  Patient demonstrates decreased episodes of LOB when performing dynamic stability indicating improved ability to retain COM. RLE weakness continues to limit patients stability during modified single limb tasks resulting in occasional use of UE for stability. Patient will benefit from skilled physical therapy to improve strength, balance, and functional mobility to decrease fall risk    Rehab Potential  Good    Clinical Impairments Affecting Rehab Potential  (+)decreased pain levels, higher PLOF (-) multiple falls, age, memory     PT Frequency  2x / week    PT Duration  8 weeks    PT Treatment/Interventions  ADLs/Self Care Home Management;Therapeutic activities;Therapeutic exercise;Balance training;Neuromuscular re-education;Patient/family education;Manual techniques;Taping;Energy conservation;Cryotherapy;Moist Heat;Electrical Stimulation;Gait training;Stair training;Functional mobility training;DME Instruction;Aquatic Therapy;Ultrasound;Traction;Passive range of motion;Vestibular    PT Next Visit Plan  turns    PT Home Exercise Plan  will give next session    Consulted and Agree with Plan of Care  Patient       Patient will benefit from skilled therapeutic intervention in order to improve the following deficits and impairments:  Abnormal gait, Decreased activity tolerance, Decreased balance, Decreased endurance, Decreased safety awareness, Decreased strength, Difficulty walking, Impaired sensation, Postural dysfunction, Pain, Improper body mechanics, Decreased mobility, Decreased knowledge of use of DME, Impaired perceived functional ability  Visit Diagnosis: Unsteadiness on feet  Difficulty walking  Muscle weakness (generalized)     Problem List Patient Active Problem List   Diagnosis Date Noted  . Cephalalgia 07/24/2015  . Neuritis or radiculitis due to rupture of lumbar  intervertebral disc 01/05/2015  . Degeneration of intervertebral disc of cervical region 04/14/2014  . B12 deficiency 12/23/2013  . Benign essential HTN 12/23/2013  . Type 2 diabetes mellitus (HCC) 12/23/2013  . Obstructive apnea 12/23/2013  . Lumbar canal stenosis 09/18/2013   Precious Bard, PT, DPT   03/13/2018, 9:56 AM  [facility] MAIN [facility] SERVICES [address] [place], [place], [address] Phone: [phone]   Fax:  [phone]  Name: SOPHONIE GOFORTH MRN: [id number] Date of Birth: 13-Jun-1931

## 2018-03-15 ENCOUNTER — Ambulatory Visit: Payer: Medicare Other

## 2018-03-15 DIAGNOSIS — R2681 Unsteadiness on feet: Secondary | ICD-10-CM

## 2018-03-15 DIAGNOSIS — M6281 Muscle weakness (generalized): Secondary | ICD-10-CM

## 2018-03-15 DIAGNOSIS — R262 Difficulty in walking, not elsewhere classified: Secondary | ICD-10-CM

## 2018-03-15 NOTE — Therapy (Signed)
[facility] MAIN [facility] SERVICES [address] [place], [place], [address] Phone: [phone]   Fax:  [phone]  Physical Therapy Treatment  Patient Details  Name: Loretta Webb MRN: [id number] Date of Birth: [birth year] Referring Provider (PT): Cristopher Peru   Encounter Date: 03/15/2018  PT End of Session - 03/15/18 0839    Visit Number  6    Number of Visits  16    Date for PT Re-Evaluation  04/19/18    Authorization Type  PN 6/10 starts 10/17    PT Start Time  0849    PT Stop Time  0934    PT Time Calculation (min)  45 min    Equipment Utilized During Treatment  Gait belt    Activity Tolerance  Patient tolerated treatment well    Behavior During Therapy  [facility] for tasks assessed/performed       Past Medical History:  Diagnosis Date  . Arthritis   . Chronic fatigue   . DM2 (diabetes mellitus, type 2) (HCC)   . Esophageal stricture   . Fibromyalgia   . GERD (gastroesophageal reflux disease)   . Hypertension   . Lumbar stenosis   . Osteoporosis   . Sleep apnea     Past Surgical History:  Procedure Laterality Date  . ABDOMINAL HYSTERECTOMY    . APPENDECTOMY    . knee replacements    . ROTATOR CUFF REPAIR      There were no vitals filed for this visit.    Step over and back orange hurdle 12x each LE, SUE support   Step over and back theraband without UE support 10x each LE; CGA    Modified squats with finger tip support in // bars 10x with tactile cues for bringing gluteals back and avoiding excessive knees over toes    RTB side stepping in // bars 10x each LE   RTB around ankles Straight leg flexion 10x each LE.   RTB hip extension BUE support. 10x each LE, verbal cues for upright posture.    Modified single leg stance: opp LE on dyna disc 30 seconds each leg; 2 sets    Stand on half foam roller: round part up: 2x30 seconds CGA     Tandem stance: airex pad; one foot on each pad x 30 seconds each LE back. 2 sets  each LE   Modified tandem stance: ball toss: 60 seconds each LE back. CGA.    Quantum Leg Press: cues for keeping slight flexion in knees at end range for protection of joint  Bilateral LE's: #75 10x; 2 sets   Single LE leg press: #30 10x ; 2 sets                            PT Education - 03/15/18 0838    Education provided  Yes    Education Details  exercise technique, stability     Person(s) Educated  Patient    Methods  Explanation;Demonstration;Verbal cues    Comprehension  Verbalized understanding;Returned demonstration       PT Short Term Goals - 02/22/18 1003      PT SHORT TERM GOAL #1   Title  Patient will be independent with completion of HEP to improve ability to complete functional tasks.    Baseline  10/17: will give next session    Time  2    Period  Weeks    Status  New  Target Date  03/08/18      PT SHORT TERM GOAL #2   Title  Patient will report no falls in 2 weeks demonstrating increased stability with mobility.     Baseline  10/17: has had falls    Time  2    Period  Weeks    Status  New    Target Date  03/08/18        PT Long Term Goals - 02/22/18 1004      PT LONG TERM GOAL #1   Title  Patient will demonstrate an improved Berg Balance Score of >46/56 for increased balance with ADLs such as standing and reaching across midline and reduce fall risk.    Baseline  10/17: 40/56    Time  8    Period  Weeks    Status  New    Target Date  04/19/18      PT LONG TERM GOAL #2   Title  Patient will improve ABC score to >67% to decrease risk for falls.    Baseline  10/17: 58.4%    Time  8    Period  Weeks    Status  New    Target Date  04/19/18      PT LONG TERM GOAL #3   Title  Patient will improve DGI score to 16/24 to demonstrate improved dynamic gait and balance and ability to walk in community.     Baseline  10/17: 13/24    Time  8    Period  Weeks    Status  New    Target Date  04/19/18      PT LONG TERM GOAL  #4   Title  Patient will improve to >1.80m/s to improve community ambulation and decrease fall risk.     Baseline  10/17: 0.18m/s with no AD    Time  8    Period  Weeks    Status  New    Target Date  04/19/18      PT LONG TERM GOAL #5   Title  Patient will achieve bilateral single leg stance >5 seconds to demonstrate improved balance and strength on each extremity.     Baseline  10/17: LLE 3seconds; RLE 2 seconds     Time  8    Period  Weeks    Status  New    Target Date  04/19/18            Plan - 03/15/18 1610    Clinical Impression Statement  Patient introduced to leg press this session for LE strengthening to improve standing and ambulatory stability. Patient reports fatigue in LLE>RLE with strengthening interventions this session. Patient demonstrating improved strength with decreased need for UE support with modified tandem stance reaching inside and outside base of support. Patient will benefit from skilled physical therapy to improve strength, balance, and functional mobility to decrease fall risk    Rehab Potential  Good    Clinical Impairments Affecting Rehab Potential  (+)decreased pain levels, higher PLOF (-) multiple falls, age, memory     PT Frequency  2x / week    PT Duration  8 weeks    PT Treatment/Interventions  ADLs/Self Care Home Management;Therapeutic activities;Therapeutic exercise;Balance training;Neuromuscular re-education;Patient/family education;Manual techniques;Taping;Energy conservation;Cryotherapy;Moist Heat;Electrical Stimulation;Gait training;Stair training;Functional mobility training;DME Instruction;Aquatic Therapy;Ultrasound;Traction;Passive range of motion;Vestibular    PT Next Visit Plan  turns    PT Home Exercise Plan  will give next session    Consulted and Agree with Plan  of Care  Patient       Patient will benefit from skilled therapeutic intervention in order to improve the following deficits and impairments:  Abnormal gait, Decreased  activity tolerance, Decreased balance, Decreased endurance, Decreased safety awareness, Decreased strength, Difficulty walking, Impaired sensation, Postural dysfunction, Pain, Improper body mechanics, Decreased mobility, Decreased knowledge of use of DME, Impaired perceived functional ability  Visit Diagnosis: Unsteadiness on feet  Difficulty walking  Muscle weakness (generalized)     Problem List Patient Active Problem List   Diagnosis Date Noted  . Cephalalgia 07/24/2015  . Neuritis or radiculitis due to rupture of lumbar intervertebral disc 01/05/2015  . Degeneration of intervertebral disc of cervical region 04/14/2014  . B12 deficiency 12/23/2013  . Benign essential HTN 12/23/2013  . Type 2 diabetes mellitus (HCC) 12/23/2013  . Obstructive apnea 12/23/2013  . Lumbar canal stenosis 09/18/2013   Precious Bard, PT, DPT   03/15/2018, 9:39 AM  [facility] MAIN [facility] SERVICES [address] [place], [place], [address] Phone: [phone]   Fax:  [phone]  Name: Loretta Webb MRN: [id number] Date of Birth: 11-26-1931

## 2018-03-20 ENCOUNTER — Ambulatory Visit: Payer: Medicare Other

## 2018-03-20 DIAGNOSIS — R2681 Unsteadiness on feet: Secondary | ICD-10-CM | POA: Diagnosis not present

## 2018-03-20 DIAGNOSIS — R262 Difficulty in walking, not elsewhere classified: Secondary | ICD-10-CM

## 2018-03-20 DIAGNOSIS — M6281 Muscle weakness (generalized): Secondary | ICD-10-CM

## 2018-03-20 NOTE — Therapy (Signed)
[facility] MAIN [facility] [address] [place], [place], [address] Phone: [phone]   Fax:  [phone]  Physical Therapy Treatment  Patient Details  Name: Loretta Webb MRN: [id number] Date of Birth: [birth year] Referring Provider (PT): Cristopher PeruHemang Shah   Encounter Date: 03/20/2018  PT End of Session - 03/20/18 0926    Visit Number  7    Number of Visits  16    Date for PT Re-Evaluation  04/19/18    Authorization Type  PN 7/10 starts 10/17    PT Start Time  0841    PT Stop Time  0925    PT Time Calculation (min)  44 min    Equipment Utilized During Treatment  Gait belt    Activity Tolerance  Patient tolerated treatment well    Behavior During Therapy  [facility] for tasks assessed/performed       Past Medical History:  Diagnosis Date  . Arthritis   . Chronic fatigue   . DM2 (diabetes mellitus, type 2) (HCC)   . Esophageal stricture   . Fibromyalgia   . GERD (gastroesophageal reflux disease)   . Hypertension   . Lumbar stenosis   . Osteoporosis   . Sleep apnea     Past Surgical History:  Procedure Laterality Date  . ABDOMINAL HYSTERECTOMY    . APPENDECTOMY    . knee replacements    . ROTATOR CUFF REPAIR      There were no vitals filed for this visit.  Subjective Assessment - 03/20/18 0925    Subjective  Patient reports she was able to get some new books from [employer] the other day because her daughters came to visit.  She went to the pain doctor yesterday who was pleased with her pain levels per her report.     Pertinent History  Patient reports her daughter wanted her to come to therapy because she has difficulty standing and reaching across body. Patient states she has fallen 3 times and rolled off the bed once. States she scraped her left arm during one of the falls. Patient completed silver sneakers for approximately 8 months following discharge from therapy approximately a year ago, but it became too hard on the  shoulders. States she wants to find something else to do after discharge from therapy. States she does not have pain but is on a pain regimen. Reports she has not been walking right now because of the recent falls and she is being very careful.  States she would like to be able to walk better while turning her head and have improved endurance..    Limitations  Lifting;Standing;Walking;House hold activities    How long can you sit comfortably?  n/a    How long can you stand comfortably?  feels unsteady when initially standing    How long can you walk comfortably?  cannot walk longer distances now    Patient Stated Goals  improve stability with walking and walk better    Currently in Pain?  No/denies       Treatment:   Nustep Lvl 4 4 minutes RPM>60 for cardiovascular challenge and increased capacity for muscle recruitment over prolonged duration.   // Lyanne CoBars:  Speed ladder: one foot in each box with occasional SUE support verbal cues for heel strike required. Patient challenged with widened step length requiring single limb support 4x length of // bars  Speed ladder: side step two feet in each box 4x length of // bars  Airex pad: ball toss reaching inside and outside BOS for stability. 2 minutes    Half foam roller: stabilization 2x30 seconds occasional posterior LOB   Stand and tap tennis ball off cone: SUE to BUE support due to challenge of single limb stance with additional coordination task.   Seated:  Hamstring stretch on 6" step 2x 30 seconds each LE   Hallway:  Rainbow ball bounces CGA 86 ft  Rainbow ball toss in air CGA 86 ft  Horizontal ball toss to L 86 ft, R 86 ft, occasional LOB requiring Min A to retain COM  Leg Press:  Single LE Leg press #30 10x each LE, 2 sets ; cues for keeping knee slightly bent to reduce torsion on knee.                        PT Education - 03/20/18 0926    Education provided  Yes    Education Details  exercise technique,  stability     Person(s) Educated  Patient    Methods  Explanation;Demonstration;Verbal cues    Comprehension  Verbalized understanding;Returned demonstration       PT Short Term Goals - 02/22/18 1003      PT SHORT TERM GOAL #1   Title  Patient will be independent with completion of HEP to improve ability to complete functional tasks.    Baseline  10/17: will give next session    Time  2    Period  Weeks    Status  New    Target Date  03/08/18      PT SHORT TERM GOAL #2   Title  Patient will report no falls in 2 weeks demonstrating increased stability with mobility.     Baseline  10/17: has had falls    Time  2    Period  Weeks    Status  New    Target Date  03/08/18        PT Long Term Goals - 02/22/18 1004      PT LONG TERM GOAL #1   Title  Patient will demonstrate an improved Berg Balance Score of >46/56 for increased balance with ADLs such as standing and reaching across midline and reduce fall risk.    Baseline  10/17: 40/56    Time  8    Period  Weeks    Status  New    Target Date  04/19/18      PT LONG TERM GOAL #2   Title  Patient will improve ABC score to >67% to decrease risk for falls.    Baseline  10/17: 58.4%    Time  8    Period  Weeks    Status  New    Target Date  04/19/18      PT LONG TERM GOAL #3   Title  Patient will improve DGI score to 16/24 to demonstrate improved dynamic gait and balance and ability to walk in community.     Baseline  10/17: 13/24    Time  8    Period  Weeks    Status  New    Target Date  04/19/18      PT LONG TERM GOAL #4   Title  Patient will improve to >1.60m/s to improve community ambulation and decrease fall risk.     Baseline  10/17: 0.67m/s with no AD    Time  8    Period  Weeks    Status  New    Target Date  04/19/18      PT LONG TERM GOAL #5   Title  Patient will achieve bilateral single leg stance >5 seconds to demonstrate improved balance and strength on each extremity.     Baseline  10/17: LLE  3seconds; RLE 2 seconds     Time  8    Period  Weeks    Status  New    Target Date  04/19/18            Plan - 03/20/18 0933    Clinical Impression Statement  Patient continues to progress with static and dynamic stability. Her strength is improving as noted with increased leg press weight. Long term recruitment of musculature is challenging to patient demonstrating limited capacity for functional mobility as seen in decreased gait mechanics with prolonged ambulation. Patient will benefit from skilled physical therapy to improve strength, balance, and functional mobility to decrease fall risk    Rehab Potential  Good    Clinical Impairments Affecting Rehab Potential  (+)decreased pain levels, higher PLOF (-) multiple falls, age, memory     PT Frequency  2x / week    PT Duration  8 weeks    PT Treatment/Interventions  ADLs/Self Care Home Management;Therapeutic activities;Therapeutic exercise;Balance training;Neuromuscular re-education;Patient/family education;Manual techniques;Taping;Energy conservation;Cryotherapy;Moist Heat;Electrical Stimulation;Gait training;Stair training;Functional mobility training;DME Instruction;Aquatic Therapy;Ultrasound;Traction;Passive range of motion;Vestibular    PT Next Visit Plan  turns    PT Home Exercise Plan  will give next session    Consulted and Agree with Plan of Care  Patient       Patient will benefit from skilled therapeutic intervention in order to improve the following deficits and impairments:  Abnormal gait, Decreased activity tolerance, Decreased balance, Decreased endurance, Decreased safety awareness, Decreased strength, Difficulty walking, Impaired sensation, Postural dysfunction, Pain, Improper body mechanics, Decreased mobility, Decreased knowledge of use of DME, Impaired perceived functional ability  Visit Diagnosis: Unsteadiness on feet  Difficulty walking  Muscle weakness (generalized)     Problem List Patient Active  Problem List   Diagnosis Date Noted  . Cephalalgia 07/24/2015  . Neuritis or radiculitis due to rupture of lumbar intervertebral disc 01/05/2015  . Degeneration of intervertebral disc of cervical region 04/14/2014  . B12 deficiency 12/23/2013  . Benign essential HTN 12/23/2013  . Type 2 diabetes mellitus (HCC) 12/23/2013  . Obstructive apnea 12/23/2013  . Lumbar canal stenosis 09/18/2013   Precious Bard, PT, DPT   03/20/2018, 9:37 AM  [facility] MAIN [facility] SERVICES [address] [place], [place], [address] Phone: [phone]   Fax:  [phone]  Name: Loretta Webb MRN: [id number] Date of Birth: 10-13-1931

## 2018-03-22 ENCOUNTER — Ambulatory Visit: Payer: Medicare Other

## 2018-03-22 DIAGNOSIS — R2681 Unsteadiness on feet: Secondary | ICD-10-CM | POA: Diagnosis not present

## 2018-03-22 DIAGNOSIS — M6281 Muscle weakness (generalized): Secondary | ICD-10-CM

## 2018-03-22 DIAGNOSIS — R262 Difficulty in walking, not elsewhere classified: Secondary | ICD-10-CM

## 2018-03-22 NOTE — Therapy (Signed)
[facility] [address] [place], [place], [address] Phone: [phone]   Fax:  [phone]  Physical Therapy Treatment  Patient Details  Name: Loretta ChessmanRichie S Noreen MRN: [id number] Date of Birth: [birth year] Referring Provider (PT): Cristopher PeruHemang Shah   Encounter Date: 03/22/2018  PT End of Session - 03/22/18 0838    Visit Number  8    Number of Visits  16    Date for PT Re-Evaluation  04/19/18    Authorization Type  PN 8/10 starts 10/17    PT Start Time  0835    PT Stop Time  0920    PT Time Calculation (min)  45 min    Equipment Utilized During Treatment  Gait belt    Activity Tolerance  Patient tolerated treatment well    Behavior During Therapy  [facility]       Past Medical History:  Diagnosis Date  . Arthritis   . Chronic fatigue   . DM2 (diabetes mellitus, type 2) (HCC)   . Esophageal stricture   . Fibromyalgia   . GERD (gastroesophageal reflux disease)   . Hypertension   . Lumbar stenosis   . Osteoporosis   . Sleep apnea     Past Surgical History:  Procedure Laterality Date  . ABDOMINAL HYSTERECTOMY    . APPENDECTOMY    . knee replacements    . ROTATOR CUFF REPAIR      There were no vitals filed for this visit.  Subjective Assessment - 03/22/18 0837    Subjective  Patient reports having a good day, is feeling a little cold. Has been reading a lot due ot the colder weather. No stumbles, falls, or pain.     Pertinent History  Patient reports her daughter wanted her to come to therapy because she has difficulty standing and reaching across body. Patient states she has fallen 3 times and rolled off the bed once. States she scraped her left arm during one of the falls. Patient completed silver sneakers for approximately 8 months following discharge from therapy approximately a year ago, but it became too hard on the shoulders. States she wants to find something else to do after discharge from  therapy. States she does not have pain but is on a pain regimen. Reports she has not been walking right now because of the recent falls and she is being very careful.  States she would like to be able to walk better while turning her head and have improved endurance..    Limitations  Lifting;Standing;Walking;House hold activities    How long can you sit comfortably?  n/a    How long can you stand comfortably?  feels unsteady when initially standing    How long can you walk comfortably?  cannot walk longer distances now    Patient Stated Goals  improve stability with walking and walk better    Currently in Pain?  No/denies       Nustep Lvl 4 4 minutes RPM>60 for cardiovascular challenge and increased capacity for muscle recruitment over prolonged duration.    // [employer]:             Speed ladder: one foot in each box with occasional SUE support verbal cues for heel strike required. Patient challenged with widened step length requiring single limb support 4x length of // bars             Speed ladder: side step two feet in each box 4x  length of // bars               Airex pad: ball toss reaching inside and outside BOS for stability. 2 minutes                          Half foam roller: stabilization 2x30 seconds occasional posterior LOB    6" step eccentric heel taps BUE support to decrease to SUE support   6 : side step up BUE support 12x each LE    Airex pad: 3x 30 seconds eyes closed    Standing marches no UE support 10x each LE  Seated:             Hamstring stretch on 6" step 2x 30 seconds each LE; 2 sets   Adductor squeezes 10x 5 seconds  Hallway:             Rainbow ball bounces CGA 86 ft             Rainbow ball toss in air CGA 86 ft             Abrupt turn and face R and L based on PT guidance 2x 86 ft  Ambulate with focus on heel strike 86 ft                            PT Education - 03/22/18 0838    Education provided  Yes    Education Details   exercise technique, stability     Person(s) Educated  Patient    Methods  Explanation;Demonstration;Verbal cues    Comprehension  Verbalized understanding;Returned demonstration       PT Short Term Goals - 02/22/18 1003      PT SHORT TERM GOAL #1   Title  Patient will be independent with completion of HEP to improve ability to complete functional tasks.    Baseline  10/17: will give next session    Time  2    Period  Weeks    Status  New    Target Date  03/08/18      PT SHORT TERM GOAL #2   Title  Patient will report no falls in 2 weeks demonstrating increased stability with mobility.     Baseline  10/17: has had falls    Time  2    Period  Weeks    Status  New    Target Date  03/08/18        PT Long Term Goals - 02/22/18 1004      PT LONG TERM GOAL #1   Title  Patient will demonstrate an improved Berg Balance Score of >46/56 for increased balance with ADLs such as standing and reaching across midline and reduce fall risk.    Baseline  10/17: 40/56    Time  8    Period  Weeks    Status  New    Target Date  04/19/18      PT LONG TERM GOAL #2   Title  Patient will improve ABC score to >67% to decrease risk for falls.    Baseline  10/17: 58.4%    Time  8    Period  Weeks    Status  New    Target Date  04/19/18      PT LONG TERM GOAL #3   Title  Patient will improve DGI score to 16/24 to demonstrate improved  dynamic gait and balance and ability to walk in community.     Baseline  10/17: 13/24    Time  8    Period  Weeks    Status  New    Target Date  04/19/18      PT LONG TERM GOAL #4   Title  Patient will improve to >1.50m/s to improve community ambulation and decrease fall risk.     Baseline  10/17: 0.51m/s with no AD    Time  8    Period  Weeks    Status  New    Target Date  04/19/18      PT LONG TERM GOAL #5   Title  Patient will achieve bilateral single leg stance >5 seconds to demonstrate improved balance and strength on each extremity.      Baseline  10/17: LLE 3seconds; RLE 2 seconds     Time  8    Period  Weeks    Status  New    Target Date  04/19/18            Plan - 03/22/18 0900    Clinical Impression Statement  Patient continues to be challenged with single limb stance as well as with turns, resulting in need for UE assistance/ "furniture surfing". Patient denies dizziness, just feels unstable during these activities. Patient's LE strength is improving with increased repetitions per set of dynamic standing interventions. Patient will benefit from skilled physical therapy to improve strength, balance, and functional mobility to decrease fall risk    Rehab Potential  Good    Clinical Impairments Affecting Rehab Potential  (+)decreased pain levels, higher PLOF (-) multiple falls, age, memory     PT Frequency  2x / week    PT Duration  8 weeks    PT Treatment/Interventions  ADLs/Self Care Home Management;Therapeutic activities;Therapeutic exercise;Balance training;Neuromuscular re-education;Patient/family education;Manual techniques;Taping;Energy conservation;Cryotherapy;Moist Heat;Electrical Stimulation;Gait training;Stair training;Functional mobility training;DME Instruction;Aquatic Therapy;Ultrasound;Traction;Passive range of motion;Vestibular    PT Next Visit Plan  turns    PT Home Exercise Plan  will give next session    Consulted and Agree with Plan of Care  Patient       Patient will benefit from skilled therapeutic intervention in order to improve the following deficits and impairments:  Abnormal gait, Decreased activity tolerance, Decreased balance, Decreased endurance, Decreased safety awareness, Decreased strength, Difficulty walking, Impaired sensation, Postural dysfunction, Pain, Improper body mechanics, Decreased mobility, Decreased knowledge of use of DME, Impaired perceived functional ability  Visit Diagnosis: Unsteadiness on feet  Difficulty walking  Muscle weakness (generalized)     Problem  List Patient Active Problem List   Diagnosis Date Noted  . Cephalalgia 07/24/2015  . Neuritis or radiculitis due to rupture of lumbar intervertebral disc 01/05/2015  . Degeneration of intervertebral disc of cervical region 04/14/2014  . B12 deficiency 12/23/2013  . Benign essential HTN 12/23/2013  . Type 2 diabetes mellitus (HCC) 12/23/2013  . Obstructive apnea 12/23/2013  . Lumbar canal stenosis 09/18/2013   Precious Bard, PT, DPT   03/22/2018, 9:22 AM  [facility] MAIN [facility] SERVICES [address] [place], [place], [address] Phone: [phone]   Fax:  [phone]  Name: RUFUS CYPERT MRN: [id number] Date of Birth: 02-Apr-1932

## 2018-03-27 ENCOUNTER — Ambulatory Visit: Payer: Medicare Other

## 2018-03-27 DIAGNOSIS — R2681 Unsteadiness on feet: Secondary | ICD-10-CM | POA: Diagnosis not present

## 2018-03-27 DIAGNOSIS — M6281 Muscle weakness (generalized): Secondary | ICD-10-CM

## 2018-03-27 DIAGNOSIS — R262 Difficulty in walking, not elsewhere classified: Secondary | ICD-10-CM

## 2018-03-27 NOTE — Therapy (Signed)
[facility] MAIN [facility] SERVICES [address] [place], [place], [address] Phone: [phone]   Fax:  [phone]  Physical Therapy Treatment  Patient Details  Name: Loretta Webb MRN: [id number] Date of Birth: [birth year] Referring Provider (PT): Cristopher Peru   Encounter Date: 03/27/2018  PT End of Session - 03/27/18 0853    Visit Number  9    Number of Visits  16    Date for PT Re-Evaluation  04/19/18    Authorization Type  PN 9/10 starts 10/17    PT Start Time  0846    PT Stop Time  0930    PT Time Calculation (min)  44 min    Equipment Utilized During Treatment  Gait belt    Activity Tolerance  Patient tolerated treatment well    Behavior During Therapy  [facility] for tasks assessed/performed       Past Medical History:  Diagnosis Date  . Arthritis   . Chronic fatigue   . DM2 (diabetes mellitus, type 2) (HCC)   . Esophageal stricture   . Fibromyalgia   . GERD (gastroesophageal reflux disease)   . Hypertension   . Lumbar stenosis   . Osteoporosis   . Sleep apnea     Past Surgical History:  Procedure Laterality Date  . ABDOMINAL HYSTERECTOMY    . APPENDECTOMY    . knee replacements    . ROTATOR CUFF REPAIR      There were no vitals filed for this visit.  Subjective Assessment - 03/27/18 0850    Subjective  Patient reports she has lost 20 lb since March of this year, has been having a hard time eating. Reports she is just not hungry. Promises to drink an Ensure a day.     Pertinent History  Patient reports her daughter wanted her to come to therapy because she has difficulty standing and reaching across body. Patient states she has fallen 3 times and rolled off the bed once. States she scraped her left arm during one of the falls. Patient completed silver sneakers for approximately 8 months following discharge from therapy approximately a year ago, but it became too hard on the shoulders. States she wants to find something else to  do after discharge from therapy. States she does not have pain but is on a pain regimen. Reports she has not been walking right now because of the recent falls and she is being very careful.  States she would like to be able to walk better while turning her head and have improved endurance..    Limitations  Lifting;Standing;Walking;House hold activities    How long can you sit comfortably?  n/a    How long can you stand comfortably?  feels unsteady when initially standing    How long can you walk comfortably?  cannot walk longer distances now    Patient Stated Goals  improve stability with walking and walk better    Currently in Pain?  No/denies       Nustep Lvl 4 4 minutes RPM>60 for cardiovascular challenge and increased capacity for muscle recruitment over prolonged duration.    // Lyanne Co:             Speed ladder: one foot in each box with occasional SUE support verbal cues for heel strike required. Patient challenged with widened step length requiring single limb support 6x length of // bars              Speed ladder: side  step two feet in each box 4x length of // bars                 6" step eccentric heel taps BUE support to decrease to SUE support               6 " side step up BUE support 12x each LE               Seated:             Hamstring stretch on 6" step 2x 30 seconds each LE; 2 sets               Adductor squeezes 15x 5 seconds    RTB 20x abduction    RTB hamstring curl 12x each LE.    Hallway:  Ambulate 300 ft with verbal cues for heel strike and widening BOS. Three episodes of LOB. Carpet more challenging than smooth surfaces.                             Leg Press:             Single LE Leg press #45 10x each LE, 2 sets ; cues for keeping knee slightly bent to reduce torsion on knee.                       PT Education - 03/27/18 0851    Education provided  Yes    Education Details  exercise technique, drinking an ensure a day to increase  strength.     Person(s) Educated  Patient    Methods  Explanation;Demonstration;Verbal cues;Tactile cues    Comprehension  Verbalized understanding;Returned demonstration       PT Short Term Goals - 02/22/18 1003      PT SHORT TERM GOAL #1   Title  Patient will be independent with completion of HEP to improve ability to complete functional tasks.    Baseline  10/17: will give next session    Time  2    Period  Weeks    Status  New    Target Date  03/08/18      PT SHORT TERM GOAL #2   Title  Patient will report no falls in 2 weeks demonstrating increased stability with mobility.     Baseline  10/17: has had falls    Time  2    Period  Weeks    Status  New    Target Date  03/08/18        PT Long Term Goals - 02/22/18 1004      PT LONG TERM GOAL #1   Title  Patient will demonstrate an improved Berg Balance Score of >46/56 for increased balance with ADLs such as standing and reaching across midline and reduce fall risk.    Baseline  10/17: 40/56    Time  8    Period  Weeks    Status  New    Target Date  04/19/18      PT LONG TERM GOAL #2   Title  Patient will improve ABC score to >67% to decrease risk for falls.    Baseline  10/17: 58.4%    Time  8    Period  Weeks    Status  New    Target Date  04/19/18      PT LONG TERM GOAL #3   Title  Patient  will improve DGI score to 16/24 to demonstrate improved dynamic gait and balance and ability to walk in community.     Baseline  10/17: 13/24    Time  8    Period  Weeks    Status  New    Target Date  04/19/18      PT LONG TERM GOAL #4   Title  Patient will improve to >1.68m/s to improve community ambulation and decrease fall risk.     Baseline  10/17: 0.15m/s with no AD    Time  8    Period  Weeks    Status  New    Target Date  04/19/18      PT LONG TERM GOAL #5   Title  Patient will achieve bilateral single leg stance >5 seconds to demonstrate improved balance and strength on each extremity.     Baseline   10/17: LLE 3seconds; RLE 2 seconds     Time  8    Period  Weeks    Status  New    Target Date  04/19/18            Plan - 03/27/18 0859    Clinical Impression Statement  Patient reports she has lost 20 lb since March. This is the first time patient has brought this subject to light and reports she has not had an appetite. Refuses to go to doctor about this issue but agreed to drink an Ensure a day. Verbalized understanding that she wont get stronger unless she eats. Patient is fearful of loss of balance with ambulation resulting in shorter step length and requiring CGA for prolonged steps. Patient will benefit from skilled physical therapy to improve strength, balance, and functional mobility to decrease fall risk    Rehab Potential  Good    Clinical Impairments Affecting Rehab Potential  (+)decreased pain levels, higher PLOF (-) multiple falls, age, memory     PT Frequency  2x / week    PT Duration  8 weeks    PT Treatment/Interventions  ADLs/Self Care Home Management;Therapeutic activities;Therapeutic exercise;Balance training;Neuromuscular re-education;Patient/family education;Manual techniques;Taping;Energy conservation;Cryotherapy;Moist Heat;Electrical Stimulation;Gait training;Stair training;Functional mobility training;DME Instruction;Aquatic Therapy;Ultrasound;Traction;Passive range of motion;Vestibular    PT Next Visit Plan  goals    PT Home Exercise Plan  will give next session    Consulted and Agree with Plan of Care  Patient       Patient will benefit from skilled therapeutic intervention in order to improve the following deficits and impairments:  Abnormal gait, Decreased activity tolerance, Decreased balance, Decreased endurance, Decreased safety awareness, Decreased strength, Difficulty walking, Impaired sensation, Postural dysfunction, Pain, Improper body mechanics, Decreased mobility, Decreased knowledge of use of DME, Impaired perceived functional ability  Visit  Diagnosis: Unsteadiness on feet  Difficulty walking  Muscle weakness (generalized)     Problem List Patient Active Problem List   Diagnosis Date Noted  . Cephalalgia 07/24/2015  . Neuritis or radiculitis due to rupture of lumbar intervertebral disc 01/05/2015  . Degeneration of intervertebral disc of cervical region 04/14/2014  . B12 deficiency 12/23/2013  . Benign essential HTN 12/23/2013  . Type 2 diabetes mellitus (HCC) 12/23/2013  . Obstructive apnea 12/23/2013  . Lumbar canal stenosis 09/18/2013  Precious Bard, PT, DPT    03/27/2018, 9:31 AM  [facility] MAIN [facility] SERVICES [address] [place], [place], [address] Phone: [phone]   Fax:  [phone]  Name: Loretta Webb MRN: [id number] Date of Birth: August 06, 1931

## 2018-03-30 ENCOUNTER — Ambulatory Visit: Payer: Medicare Other

## 2018-03-30 DIAGNOSIS — M6281 Muscle weakness (generalized): Secondary | ICD-10-CM

## 2018-03-30 DIAGNOSIS — R2681 Unsteadiness on feet: Secondary | ICD-10-CM

## 2018-03-30 DIAGNOSIS — R262 Difficulty in walking, not elsewhere classified: Secondary | ICD-10-CM

## 2018-03-30 NOTE — Therapy (Signed)
[place] MAIN [facility] SERVICES [address] [place], [place], [address] Phone: [phone]   Fax:  [phone]  Physical Therapy Treatment Physical Therapy Progress Note   Dates of reporting period  02/22/18 to   03/30/18  Patient Details  Name: Loretta Webb MRN: [id number] Date of Birth: 03/05/1932 Referring Provider (PT): Jennings Books   Encounter Date: 03/30/2018  PT End of Session - 03/30/18 0859    Visit Number  10    Number of Visits  16    Date for PT Re-Evaluation  04/19/18    Authorization Type  PN 10/10 starts 10/17 : (Next one 1/10 PN start 03/30/18 )    PT Start Time  0816    PT Stop Time  0858    PT Time Calculation (min)  42 min    Equipment Utilized During Treatment  Gait belt    Activity Tolerance  Patient tolerated treatment well    Behavior During Therapy  [facility] for tasks assessed/performed       Past Medical History:  Diagnosis Date  . Arthritis   . Chronic fatigue   . DM2 (diabetes mellitus, type 2) ([place])   . Esophageal stricture   . Fibromyalgia   . GERD (gastroesophageal reflux disease)   . Hypertension   . Lumbar stenosis   . Osteoporosis   . Sleep apnea     Past Surgical History:  Procedure Laterality Date  . ABDOMINAL HYSTERECTOMY    . APPENDECTOMY    . knee replacements    . ROTATOR CUFF REPAIR      There were no vitals filed for this visit.  Subjective Assessment - 03/30/18 0820    Subjective  Patient reports no aches or pains. Has had no stumbles or falls. Did not have her ensure today or yesterday. Says she will take it when she gets home.     Pertinent History  Patient reports her daughter wanted her to come to therapy because she has difficulty standing and reaching across body. Patient states she has fallen 3 times and rolled off the bed once. States she scraped her left arm during one of the falls. Patient completed silver sneakers for approximately 8 months following discharge from therapy  approximately a year ago, but it became too hard on the shoulders. States she wants to find something else to do after discharge from therapy. States she does not have pain but is on a pain regimen. Reports she has not been walking right now because of the recent falls and she is being very careful.  States she would like to be able to walk better while turning her head and have improved endurance..    Limitations  Lifting;Standing;Walking;House hold activities    How long can you sit comfortably?  n/a    How long can you stand comfortably?  feels unsteady when initially standing    How long can you walk comfortably?  cannot walk longer distances now    Patient Stated Goals  improve stability with walking and walk better    Currently in Pain?  No/denies       HEP: compliant No falls BERG: 46/56  ABC: 67%  DGI: 14/24 10 MWT: 8 seconds =1.25% Single limb stance: L 3 seconds R 5   [facility] PT Assessment - 03/30/18 0001      Standardized Balance Assessment   Standardized Balance Assessment  Berg Balance Test;Dynamic Gait Index      Berg Balance Test  Sit to Stand  Able to stand without using hands and stabilize independently    Standing Unsupported  Able to stand safely 2 minutes    Sitting with Back Unsupported but Feet Supported on Floor or Stool  Able to sit safely and securely 2 minutes    Stand to Sit  Sits safely with minimal use of hands    Transfers  Able to transfer safely, minor use of hands    Standing Unsupported with Eyes Closed  Able to stand 10 seconds with supervision    Standing Ubsupported with Feet Together  Able to place feet together independently and stand for 1 minute with supervision    From Standing, Reach Forward with Outstretched Arm  Can reach confidently >25 cm (10")    From Standing Position, Pick up Object from Floor  Able to pick up shoe safely and easily    From Standing Position, Turn to Look Behind Over each Shoulder  Looks behind from both sides and  weight shifts well    Turn 360 Degrees  Able to turn 360 degrees safely but slowly    Standing Unsupported, Alternately Place Feet on Step/Stool  Able to complete 4 steps without aid or supervision    Standing Unsupported, One Foot in Front  Able to take small step independently and hold 30 seconds    Standing on One Leg  Able to lift leg independently and hold equal to or more than 3 seconds    Total Score  46      Dynamic Gait Index   Level Surface  Normal    Change in Gait Speed  Mild Impairment    Gait with Horizontal Head Turns  Moderate Impairment    Gait with Vertical Head Turns  Mild Impairment    Gait and Pivot Turn  Mild Impairment    Step Over Obstacle  Severe Impairment    Step Around Obstacles  Mild Impairment    Steps  Mild Impairment    Total Score  14      Leg Press:             Single LE Leg press #45 10x each LE, 2 sets ; cues for keeping knee slightly bent to reduce torsion on knee.  Seated hamstring stretch 2x 60 seconds each LE  Discussion of patient progression and areas of improvement. Patient educated on need for continued therapy for dynamic gait stability to decrease fall risk.    Patient's condition has the potential to improve in response to therapy. Maximum improvement is yet to be obtained. The anticipated improvement is attainable and reasonable in a generally predictable time.  Patient reports she is more stable and has not fallen. Reports she feels she can do everything she needs to at home, its more so in the community she has difficulty due to her balance and LE weakness.   Patient has met her short term goals. She has met her BERG goal by increasing from 40 to 46/56 demonstrating deficits with single limb stance, velocity of movement, and dynamic stability. Her DGI had little increase due to difficulty with horizontal and vertical head turns. Her baseline ambulation has dramatically improved to 1.25 m/s with increased step length and stability. Patient's  condition has the potential to improve in response to therapy. Maximum improvement is yet to be obtained. The anticipated improvement is attainable and reasonable in a generally predictable time.                    PT Education - 03/30/18 0857    Education provided  Yes    Education Details  exercise technique, goals, progress, POC     Person(s) Educated  Patient    Methods  Explanation;Demonstration;Tactile cues;Verbal cues    Comprehension  Verbalized understanding;Returned demonstration;Need further instruction;Tactile cues required       PT Short Term Goals - 03/30/18 0841      PT SHORT TERM GOAL #1   Title  Patient will be independent with completion of HEP to improve ability to complete functional tasks.    Baseline  10/17: will give next session 11/22: compliant    Time  2    Period  Weeks    Status  Achieved      PT SHORT TERM GOAL #2   Title  Patient will report no falls in 2 weeks demonstrating increased stability with mobility.     Baseline  10/17: has had falls 11/22: no falls     Time  2    Period  Weeks    Status  Achieved        PT Long Term Goals - 03/30/18 0841      PT LONG TERM GOAL #1   Title  Patient will demonstrate an improved Berg Balance Score of >46/56 for increased balance with ADLs such as standing and reaching across midline and reduce fall risk.    Baseline  10/17: 40/56 11/22: 46/56    Time  8    Period  Weeks    Status  Achieved      PT LONG TERM GOAL #2   Title  Patient will improve ABC score to >67% to decrease risk for falls.    Baseline  10/17: 58.4% 11/22: 67%    Time  8    Period  Weeks    Status  Achieved      PT LONG TERM GOAL #3   Title  Patient will improve DGI score to 16/24 to demonstrate improved dynamic gait and balance and ability to walk in community.     Baseline  10/17: 13/24 11/22: 14/24    Time  8    Period  Weeks    Status  New    Target Date  04/19/18      PT LONG TERM GOAL #4   Title  Patient  will improve 28mt to >1.072m to improve community ambulation and decrease fall risk.     Baseline  10/17: 0.7716mwith no AD 11/22: 1.25 m/s     Time  8    Period  Weeks    Status  Achieved      PT LONG TERM GOAL #5   Title  Patient will achieve bilateral single leg stance >5 seconds to demonstrate improved balance and strength on each extremity.     Baseline  10/17: LLE 3seconds; RLE 2 seconds  11/22: L 3 seconds R 5 seconds    Time  8    Period  Weeks    Status  Partially Met    Target Date  04/19/18      Additional Long Term Goals   Additional Long Term Goals  Yes      PT LONG TERM GOAL #6   Title  Patient will demonstrate an improved Berg Balance Score of >52/56 for increased balance with ADLs such as standing and reaching across midline and reduce fall risk.    Baseline  11/22: 46/56    Time  8    Period  Weeks    Status  New    Target Date  04/19/18              Patient will benefit from skilled therapeutic intervention in order to improve the following deficits and impairments:     Visit Diagnosis: Unsteadiness on feet  Difficulty walking  Muscle weakness (generalized)     Problem List Patient Active Problem List   Diagnosis Date Noted  . Cephalalgia 07/24/2015  . Neuritis or radiculitis due to rupture of lumbar intervertebral disc 01/05/2015  . Degeneration of intervertebral disc of cervical region 04/14/2014  . B12 deficiency 12/23/2013  . Benign essential HTN 12/23/2013  . Type 2 diabetes mellitus (HCC) 12/23/2013  . Obstructive apnea 12/23/2013  . Lumbar canal stenosis 09/18/2013    , PT, DPT   03/30/2018, 9:03 AM  [facility] RE[facility] SERVICES [address] [place], [place], [address] Phone: [phone]   Fax:  [phone]  Name: Ryley S Lund MRN: [id number] Date of Birth: 11/13/1931   

## 2018-04-03 ENCOUNTER — Ambulatory Visit: Payer: Medicare Other

## 2018-04-03 DIAGNOSIS — R262 Difficulty in walking, not elsewhere classified: Secondary | ICD-10-CM

## 2018-04-03 DIAGNOSIS — R2681 Unsteadiness on feet: Secondary | ICD-10-CM | POA: Diagnosis not present

## 2018-04-03 DIAGNOSIS — M6281 Muscle weakness (generalized): Secondary | ICD-10-CM

## 2018-04-03 NOTE — Therapy (Signed)
[place] MAIN [facility] SERVICES [address] [place], [place], [address] Phone: [phone]   Fax:  [phone]  Physical Therapy Treatment  Patient Details  Name: Loretta Webb MRN: [id number] Date of Birth: 27-Apr-1932 Referring Provider (PT): Jennings Books   Encounter Date: 04/03/2018  PT End of Session - 04/03/18 0840    Visit Number  11    Number of Visits  16    Date for PT Re-Evaluation  04/19/18    Authorization Type  (Next one 1/10 PN start 03/30/18 )    PT Start Time  0833    PT Stop Time  0916    PT Time Calculation (min)  43 min    Equipment Utilized During Treatment  Gait belt    Activity Tolerance  Patient tolerated treatment well    Behavior During Therapy  [facility] for tasks assessed/performed       Past Medical History:  Diagnosis Date  . Arthritis   . Chronic fatigue   . DM2 (diabetes mellitus, type 2) ([place])   . Esophageal stricture   . Fibromyalgia   . GERD (gastroesophageal reflux disease)   . Hypertension   . Lumbar stenosis   . Osteoporosis   . Sleep apnea     Past Surgical History:  Procedure Laterality Date  . ABDOMINAL HYSTERECTOMY    . APPENDECTOMY    . knee replacements    . ROTATOR CUFF REPAIR      There were no vitals filed for this visit.  Subjective Assessment - 04/03/18 0837    Subjective  Patient reports her back started hurting saturday morning and still is hurting today. Hurt her L wrist last week but unsure of how/why. Reports no falls or LOB.     Pertinent History  Patient reports her daughter wanted her to come to therapy because she has difficulty standing and reaching across body. Patient states she has fallen 3 times and rolled off the bed once. States she scraped her left arm during one of the falls. Patient completed silver sneakers for approximately 8 months following discharge from therapy approximately a year ago, but it became too hard on the shoulders. States she wants to find  something else to do after discharge from therapy. States she does not have pain but is on a pain regimen. Reports she has not been walking right now because of the recent falls and she is being very careful.  States she would like to be able to walk better while turning her head and have improved endurance..    Limitations  Lifting;Standing;Walking;House hold activities    How long can you sit comfortably?  n/a    How long can you stand comfortably?  feels unsteady when initially standing    How long can you walk comfortably?  cannot walk longer distances now    Patient Stated Goals  improve stability with walking and walk better    Currently in Pain?  Yes    Pain Score  7     Pain Location  Back    Pain Orientation  Lower    Pain Descriptors / Indicators  Aching    Pain Type  Chronic pain    Pain Onset  In the past 7 days    Multiple Pain Sites  Yes    Pain Score  8    Pain Location  Wrist    Pain Orientation  Left    Pain Descriptors / Indicators  Shooting  Pain Type  Acute pain    Pain Onset  In the past 7 days    Pain Frequency  Intermittent    Aggravating Factors   pushing up with it/using it        Nustep Lvl 4 4 minutes RPM>60 for cardiovascular challenge and increased capacity for muscle recruitment over prolonged duration.   Ambulate in hallway with horizontal ball tosses 10x each LE    // Bars:             Speed ladder: one foot in each box with occasional SUE support verbal cues for heel strike required. Patient challenged with widened step length requiring single limb support 6x length of // bars               Speed ladder: side step two feet in each box 6x length of // bars               step over orange hurdle 12x each LE; knocked over LLE more frequently.      dyna disc weight shift no UE support 60 seconds each LE.   Seated:             Hamstring stretch on 6" step 2x 60 seconds each LE; 2 sets               Adductor squeezes 15x 5 seconds                           GTB 20x abduction                GTB hamstring curl 12x each LE.    Hallway:             Ambulate 100 ft with verbal cues for heel strike and widening BOS. Three episodes of LOB. Carpet more challenging than smooth surfaces.       Heated pad utilized in seated position to reduce pain in low back.                        PT Education - 04/03/18 0839    Education provided  Yes    Education Details  exercise technique, stability     Person(s) Educated  Patient    Methods  Explanation;Demonstration;Verbal cues    Comprehension  Verbalized understanding;Returned demonstration       PT Short Term Goals - 03/30/18 0841      PT SHORT TERM GOAL #1   Title  Patient will be independent with completion of HEP to improve ability to complete functional tasks.    Baseline  10/17: will give next session 11/22: compliant    Time  2    Period  Weeks    Status  Achieved      PT SHORT TERM GOAL #2   Title  Patient will report no falls in 2 weeks demonstrating increased stability with mobility.     Baseline  10/17: has had falls 11/22: no falls     Time  2    Period  Weeks    Status  Achieved        PT Long Term Goals - 03/30/18 0841      PT LONG TERM GOAL #1   Title  Patient will demonstrate an improved Berg Balance Score of >46/56 for increased balance with ADLs such as standing and reaching across midline and reduce fall risk.    Baseline  10/17: 40/56 11/22: 46/56    Time  8    Period  Weeks    Status  Achieved      PT LONG TERM GOAL #2   Title  Patient will improve ABC score to >67% to decrease risk for falls.    Baseline  10/17: 58.4% 11/22: 67%    Time  8    Period  Weeks    Status  Achieved      PT LONG TERM GOAL #3   Title  Patient will improve DGI score to 16/24 to demonstrate improved dynamic gait and balance and ability to walk in community.     Baseline  10/17: 13/24 11/22: 14/24    Time  8    Period  Weeks    Status  New    Target  Date  04/19/18      PT LONG TERM GOAL #4   Title  Patient will improve 69mt to >1.054m to improve community ambulation and decrease fall risk.     Baseline  10/17: 0.7785mwith no AD 11/22: 1.25 m/s     Time  8    Period  Weeks    Status  Achieved      PT LONG TERM GOAL #5   Title  Patient will achieve bilateral single leg stance >5 seconds to demonstrate improved balance and strength on each extremity.     Baseline  10/17: LLE 3seconds; RLE 2 seconds  11/22: L 3 seconds R 5 seconds    Time  8    Period  Weeks    Status  Partially Met    Target Date  04/19/18      Additional Long Term Goals   Additional Long Term Goals  Yes      PT LONG TERM GOAL #6   Title  Patient will demonstrate an improved Berg Balance Score of >52/56 for increased balance with ADLs such as standing and reaching across midline and reduce fall risk.    Baseline  11/22: 46/56    Time  8    Period  Weeks    Status  New    Target Date  04/19/18            Plan - 04/03/18 0906    Clinical Impression Statement  Patient's back pain limited session's interventions due to need to perform multiple interventions in seated position. Use of heat pad in sitting allowed for reduction of pain and increased response to therapeutic measures. Patient challenged with step over with LLE, frequently knocking orange hurdle over. Patient will benefit from skilled physical therapy to improve strength, balance, and functional mobility to decrease fall risk    Rehab Potential  Good    Clinical Impairments Affecting Rehab Potential  (+)decreased pain levels, higher PLOF (-) multiple falls, age, memory     PT Frequency  2x / week    PT Duration  8 weeks    PT Treatment/Interventions  ADLs/Self Care Home Management;Therapeutic activities;Therapeutic exercise;Balance training;Neuromuscular re-education;Patient/family education;Manual techniques;Taping;Energy conservation;Cryotherapy;Moist Heat;Electrical Stimulation;Gait  training;Stair training;Functional mobility training;DME Instruction;Aquatic Therapy;Ultrasound;Traction;Passive range of motion;Vestibular    PT Next Visit Plan  goals    PT Home Exercise Plan  will give next session    Consulted and Agree with Plan of Care  Patient       Patient will benefit from skilled therapeutic intervention in order to improve the following deficits and impairments:  Abnormal gait, Decreased activity tolerance, Decreased balance, Decreased endurance, Decreased safety awareness, Decreased  strength, Difficulty walking, Impaired sensation, Postural dysfunction, Pain, Improper body mechanics, Decreased mobility, Decreased knowledge of use of DME, Impaired perceived functional ability  Visit Diagnosis: Unsteadiness on feet  Difficulty walking  Muscle weakness (generalized)     Problem List Patient Active Problem List   Diagnosis Date Noted  . Cephalalgia 07/24/2015  . Neuritis or radiculitis due to rupture of lumbar intervertebral disc 01/05/2015  . Degeneration of intervertebral disc of cervical region 04/14/2014  . B12 deficiency 12/23/2013  . Benign essential HTN 12/23/2013  . Type 2 diabetes mellitus ([place]) 12/23/2013  . Obstructive apnea 12/23/2013  . Lumbar canal stenosis 09/18/2013   Janna Arch, PT, DPT   04/03/2018, 10:00 AM  [place] MAIN [facility] SERVICES [address] [place], [place], [address] Phone: [phone]   Fax:  [phone]  Name: AVANNA SOWDER MRN: [id number] Date of Birth: Jun 04, 1931

## 2018-04-10 ENCOUNTER — Ambulatory Visit: Payer: Medicare Other | Attending: Sports Medicine

## 2018-04-10 DIAGNOSIS — R2681 Unsteadiness on feet: Secondary | ICD-10-CM | POA: Diagnosis not present

## 2018-04-10 DIAGNOSIS — R262 Difficulty in walking, not elsewhere classified: Secondary | ICD-10-CM | POA: Diagnosis present

## 2018-04-10 DIAGNOSIS — M6281 Muscle weakness (generalized): Secondary | ICD-10-CM | POA: Insufficient documentation

## 2018-04-10 NOTE — Therapy (Signed)
[place] MAIN [facility] SERVICES [address] [place], [place], [address] Phone: [phone]   Fax:  [phone]  Physical Therapy Treatment/ Discharge  Patient Details  Name: Loretta Webb MRN: [id number] Date of Birth: [birth year] Referring Provider (PT): Jennings Books   Encounter Date: 04/10/2018  PT End of Session - 04/10/18 0840    Visit Number  12    Number of Visits  16    Date for PT Re-Evaluation  04/19/18    Authorization Type  2/10 PN start 03/30/18     PT Start Time  0835    PT Stop Time  0919    PT Time Calculation (min)  44 min    Equipment Utilized During Treatment  Gait belt    Activity Tolerance  Patient tolerated treatment well    Behavior During Therapy  [facility] for tasks assessed/performed       Past Medical History:  Diagnosis Date  . Arthritis   . Chronic fatigue   . DM2 (diabetes mellitus, type 2) ([place])   . Esophageal stricture   . Fibromyalgia   . GERD (gastroesophageal reflux disease)   . Hypertension   . Lumbar stenosis   . Osteoporosis   . Sleep apnea     Past Surgical History:  Procedure Laterality Date  . ABDOMINAL HYSTERECTOMY    . APPENDECTOMY    . knee replacements    . ROTATOR CUFF REPAIR      There were no vitals filed for this visit.  Subjective Assessment - 04/10/18 0838    Subjective  Patient reports she has started her walking program yesterday outside for 10 minutes. Is ready for today to be her last session due to being able to walk outside again.     Pertinent History  Patient reports her daughter wanted her to come to therapy because she has difficulty standing and reaching across body. Patient states she has fallen 3 times and rolled off the bed once. States she scraped her left arm during one of the falls. Patient completed silver sneakers for approximately 8 months following discharge from therapy approximately a year ago, but it became too hard on the shoulders. States she wants to find  something else to do after discharge from therapy. States she does not have pain but is on a pain regimen. Reports she has not been walking right now because of the recent falls and she is being very careful.  States she would like to be able to walk better while turning her head and have improved endurance..    Limitations  Lifting;Standing;Walking;House hold activities    How long can you sit comfortably?  n/a    How long can you stand comfortably?  feels unsteady when initially standing    How long can you walk comfortably?  cannot walk longer distances now    Patient Stated Goals  improve stability with walking and walk better    Currently in Pain?  Yes    Pain Score  2     Pain Location  Back    Pain Orientation  Lower    Pain Descriptors / Indicators  Aching    Pain Type  Chronic pain    Pain Onset  In the past 7 days    Pain Frequency  Intermittent        Nustep Lvl 4 4 minutes RPM>60 for cardiovascular challenge and increased capacity for muscle recruitment over prolonged duration.    Sit to stands without  UE support 10x.    // Bars:   Airex pad ; horizontal head turns "I spy" 60 seconds   4" step step up down SUE support 10x each LE. CGA  4 ' step lateral step up and down SUE support 10x each LE; CGA               step over orange hurdle 12x each LE; knocked over LLE more frequently.                 Half foam roller: static ankle stability 60 seconds, initially rocked with reduction of movement with time.    Seated:             Hamstring stretch on 6" step 2x 60 seconds each LE; 2 sets   Adduction ball squeezes 10x 5 second holds              Hallway:             Vertical ball passes 86 ft, ball bounces 86 ft. CGA; 2 episodes of LOB       Leg Press: Single LE Leg press #45 10x each LE, 2 sets ; cues for keeping knee slightly bent to reduce torsion on knee.                     PT Education - 04/10/18 0839    Education provided  Yes     Education Details  exercise technique, d/c,     Person(s) Educated  Patient    Methods  Explanation;Demonstration;Verbal cues;Tactile cues    Comprehension  Verbalized understanding;Returned demonstration       PT Short Term Goals - 03/30/18 0841      PT SHORT TERM GOAL #1   Title  Patient will be independent with completion of HEP to improve ability to complete functional tasks.    Baseline  10/17: will give next session 11/22: compliant    Time  2    Period  Weeks    Status  Achieved      PT SHORT TERM GOAL #2   Title  Patient will report no falls in 2 weeks demonstrating increased stability with mobility.     Baseline  10/17: has had falls 11/22: no falls     Time  2    Period  Weeks    Status  Achieved        PT Long Term Goals - 03/30/18 0841      PT LONG TERM GOAL #1   Title  Patient will demonstrate an improved Berg Balance Score of >46/56 for increased balance with ADLs such as standing and reaching across midline and reduce fall risk.    Baseline  10/17: 40/56 11/22: 46/56    Time  8    Period  Weeks    Status  Achieved      PT LONG TERM GOAL #2   Title  Patient will improve ABC score to >67% to decrease risk for falls.    Baseline  10/17: 58.4% 11/22: 67%    Time  8    Period  Weeks    Status  Achieved      PT LONG TERM GOAL #3   Title  Patient will improve DGI score to 16/24 to demonstrate improved dynamic gait and balance and ability to walk in community.     Baseline  10/17: 13/24 11/22: 14/24    Time  8    Period  [place]  Status  New    Target Date  04/19/18      PT LONG TERM GOAL #4   Title  Patient will improve 6mt to >1.055m to improve community ambulation and decrease fall risk.     Baseline  10/17: 0.7756mwith no AD 11/22: 1.25 m/s     Time  8    Period  Weeks    Status  Achieved      PT LONG TERM GOAL #5   Title  Patient will achieve bilateral single leg stance >5 seconds to demonstrate improved balance and strength on each extremity.      Baseline  10/17: LLE 3seconds; RLE 2 seconds  11/22: L 3 seconds R 5 seconds    Time  8    Period  Weeks    Status  Partially Met    Target Date  04/19/18      Additional Long Term Goals   Additional Long Term Goals  Yes      PT LONG TERM GOAL #6   Title  Patient will demonstrate an improved Berg Balance Score of >52/56 for increased balance with ADLs such as standing and reaching across midline and reduce fall risk.    Baseline  11/22: 46/56    Time  8    Period  Weeks    Status  New    Target Date  04/19/18            Plan - 04/10/18 0850    Clinical Impression Statement  Today will be patient's last session due to patient feeling she is ready to return to home walking program and return of confidence in daily living. Goals redone two sessions ago so session today focused on areas of improvement for patient. Patient demonstrates ability to perform sit to stands without UE support and ambulate in hallway without LOB, only had two episodes of stumbling when bouncing ball resulted in bouncing ball off foot. I will be happy to see patient again in the future as needed.     Rehab Potential  Good    Clinical Impairments Affecting Rehab Potential  (+)decreased pain levels, higher PLOF (-) multiple falls, age, memory     PT Frequency  2x / week    PT Duration  8 weeks    PT Treatment/Interventions  ADLs/Self Care Home Management;Therapeutic activities;Therapeutic exercise;Balance training;Neuromuscular re-education;Patient/family education;Manual techniques;Taping;Energy conservation;Cryotherapy;Moist Heat;Electrical Stimulation;Gait training;Stair training;Functional mobility training;DME Instruction;Aquatic Therapy;Ultrasound;Traction;Passive range of motion;Vestibular    PT Next Visit Plan  goals    PT Home Exercise Plan  will give next session    Consulted and Agree with Plan of Care  Patient       Patient will benefit from skilled therapeutic intervention in order to improve  the following deficits and impairments:  Abnormal gait, Decreased activity tolerance, Decreased balance, Decreased endurance, Decreased safety awareness, Decreased strength, Difficulty walking, Impaired sensation, Postural dysfunction, Pain, Improper body mechanics, Decreased mobility, Decreased knowledge of use of DME, Impaired perceived functional ability  Visit Diagnosis: Unsteadiness on feet  Difficulty walking  Muscle weakness (generalized)     Problem List Patient Active Problem List   Diagnosis Date Noted  . Cephalalgia 07/24/2015  . Neuritis or radiculitis due to rupture of lumbar intervertebral disc 01/05/2015  . Degeneration of intervertebral disc of cervical region 04/14/2014  . B12 deficiency 12/23/2013  . Benign essential HTN 12/23/2013  . Type 2 diabetes mellitus ([place]8/17/2015  . Obstructive apnea 12/23/2013  . Lumbar canal stenosis 09/18/2013  Janna Arch, PT, DPT   04/10/2018, 9:23 AM  [place] [facility] SERVICES [address] [place], [place], [address] Phone: [phone]   Fax:  [phone]  Name: Loretta Webb MRN: [id number] Date of Birth: 05-21-1931

## 2018-04-12 ENCOUNTER — Ambulatory Visit: Payer: Medicare Other

## 2018-04-17 ENCOUNTER — Ambulatory Visit: Payer: Medicare Other

## 2018-04-19 ENCOUNTER — Ambulatory Visit: Payer: Medicare Other

## 2018-05-05 ENCOUNTER — Other Ambulatory Visit: Payer: Self-pay

## 2018-05-05 ENCOUNTER — Emergency Department
Admission: EM | Admit: 2018-05-05 | Discharge: 2018-05-05 | Disposition: A | Discharge status: Home / Self Care | Payer: Medicare Other | Attending: Emergency Medicine | Admitting: Emergency Medicine

## 2018-05-05 ENCOUNTER — Encounter: Payer: Self-pay | Admitting: Emergency Medicine

## 2018-05-05 ENCOUNTER — Emergency Department: Payer: Medicare Other

## 2018-05-05 DIAGNOSIS — S0990XA Unspecified injury of head, initial encounter: Secondary | ICD-10-CM | POA: Insufficient documentation

## 2018-05-05 DIAGNOSIS — Z79899 Other long term (current) drug therapy: Secondary | ICD-10-CM | POA: Diagnosis not present

## 2018-05-05 DIAGNOSIS — Z7984 Long term (current) use of oral hypoglycemic drugs: Secondary | ICD-10-CM | POA: Diagnosis not present

## 2018-05-05 DIAGNOSIS — Y939 Activity, unspecified: Secondary | ICD-10-CM | POA: Insufficient documentation

## 2018-05-05 DIAGNOSIS — Y998 Other external cause status: Secondary | ICD-10-CM | POA: Insufficient documentation

## 2018-05-05 DIAGNOSIS — Y929 Unspecified place or not applicable: Secondary | ICD-10-CM | POA: Insufficient documentation

## 2018-05-05 DIAGNOSIS — I10 Essential (primary) hypertension: Secondary | ICD-10-CM | POA: Diagnosis not present

## 2018-05-05 DIAGNOSIS — Z7902 Long term (current) use of antithrombotics/antiplatelets: Secondary | ICD-10-CM | POA: Insufficient documentation

## 2018-05-05 DIAGNOSIS — S62101A Fracture of unspecified carpal bone, right wrist, initial encounter for closed fracture: Secondary | ICD-10-CM | POA: Diagnosis not present

## 2018-05-05 DIAGNOSIS — W19XXXA Unspecified fall, initial encounter: Secondary | ICD-10-CM | POA: Insufficient documentation

## 2018-05-05 DIAGNOSIS — E119 Type 2 diabetes mellitus without complications: Secondary | ICD-10-CM | POA: Diagnosis not present

## 2018-05-05 LAB — URINALYSIS, COMPLETE (UACMP) WITH MICROSCOPIC
BACTERIA UA: NONE SEEN
Bilirubin Urine: NEGATIVE
Glucose, UA: NEGATIVE mg/dL
KETONES UR: 20 mg/dL — AB
Leukocytes, UA: NEGATIVE
Nitrite: NEGATIVE
Protein, ur: NEGATIVE mg/dL
Specific Gravity, Urine: 1.02 (ref 1.005–1.030)
pH: 5 (ref 5.0–8.0)

## 2018-05-05 LAB — CBC WITH DIFFERENTIAL/PLATELET
Abs Immature Granulocytes: 0.05 10*3/uL (ref 0.00–0.07)
Basophils Absolute: 0 10*3/uL (ref 0.0–0.1)
Basophils Relative: 0 %
EOS ABS: 0 10*3/uL (ref 0.0–0.5)
EOS PCT: 0 %
HEMATOCRIT: 38 % (ref 36.0–46.0)
HEMOGLOBIN: 12.5 g/dL (ref 12.0–15.0)
Immature Granulocytes: 1 %
LYMPHS ABS: 1.4 10*3/uL (ref 0.7–4.0)
LYMPHS PCT: 14 %
MCH: 28.5 pg (ref 26.0–34.0)
MCHC: 32.9 g/dL (ref 30.0–36.0)
MCV: 86.8 fL (ref 80.0–100.0)
Monocytes Absolute: 1 10*3/uL (ref 0.1–1.0)
Monocytes Relative: 10 %
NRBC: 0 % (ref 0.0–0.2)
Neutro Abs: 7.4 10*3/uL (ref 1.7–7.7)
Neutrophils Relative %: 75 %
Platelets: 263 10*3/uL (ref 150–400)
RBC: 4.38 MIL/uL (ref 3.87–5.11)
RDW: 14.4 % (ref 11.5–15.5)
WBC: 9.9 10*3/uL (ref 4.0–10.5)

## 2018-05-05 LAB — BASIC METABOLIC PANEL
Anion gap: 10 (ref 5–15)
BUN: 13 mg/dL (ref 8–23)
CHLORIDE: 101 mmol/L (ref 98–111)
CO2: 23 mmol/L (ref 22–32)
Calcium: 9.4 mg/dL (ref 8.9–10.3)
Creatinine, Ser: 0.89 mg/dL (ref 0.44–1.00)
GFR calc Af Amer: >60 mL/min (ref >60)
GFR calc non Af Amer: 60 mL/min — ABNORMAL LOW (ref >60)
Glucose, Bld: 124 mg/dL — ABNORMAL HIGH (ref 70–99)
POTASSIUM: 4.3 mmol/L (ref 3.5–5.1)
Sodium: 134 mmol/L — ABNORMAL LOW (ref 135–145)

## 2018-05-05 LAB — TROPONIN I: Troponin I: <0.03 ng/mL (ref <0.03)

## 2018-05-05 LAB — CK: Total CK: 586 U/L — ABNORMAL HIGH (ref 38–234)

## 2018-05-05 MED ORDER — SODIUM CHLORIDE 0.9 % IV BOLUS
500.0000 mL | Freq: Once | INTRAVENOUS | Status: AC
  Administered 2018-05-05: 500 mL via INTRAVENOUS

## 2018-05-05 NOTE — ED Notes (Signed)
Pt fell in bathtub at home, laid there all night. Pt denies LOC. Laceration to posterior right head-no bleeding, right wrist bruising and deformity, left lower leg laceration-no bleeding, right ear purple colored bruising, reddened bilateral buttocks.

## 2018-05-05 NOTE — ED Notes (Signed)
Loretta Webb from case management says he will run things through insurance he will fax the information to PCP to give family options. Family made aware at this time and would like to go home.

## 2018-05-05 NOTE — ED Notes (Signed)
Care management paged.

## 2018-05-05 NOTE — Discharge Instructions (Addendum)
Follow closely with orthopedic surgery and with with primary care doctor on Monday, stay with family over the next couple days, return to the emergency room for any new or worrisome symptoms.  Take Tylenol or over-the-counter ibuprofen as tolerated for the discomfort.

## 2018-05-05 NOTE — ED Notes (Signed)
Patient transported to CT 

## 2018-05-05 NOTE — ED Triage Notes (Signed)
Arrives with daughter who states patient called her this morning and stated that she had fallen in the bathtub and had been there for days.  Daughter states patient was confused when she called her, but now confusion has resolved.  Daughter also states that she must have fallen last night.  Patient was out of the tub when daughter arrives this morning.   Patient is AAOx3.  Right wrist swelling and ecchymosis seen also patient has some old blood to back of head.

## 2018-05-05 NOTE — ED Notes (Addendum)
Pt's family no longer wants a case management consult. They are taking her home tonight to stay with them and will follow up with family medicine to determine pt needs. Pt able to ambulate safely to chair.

## 2018-05-05 NOTE — ED Provider Notes (Addendum)
[facility] Emergency Department Provider Note  ____________________________________________   I have reviewed the triage vital signs and the nursing notes. Where available I have reviewed prior notes and, if possible and indicated, outside hospital notes.    HISTORY  Chief Complaint Fall    HPI Loretta ChessmanRichie S Webb is a 84 y.o. female who presents today complaining of a fall.  She is a little confused about exactly when she fell but according to family she fell overnight.  Estimated downtime was less than 4 hours.  Patient complains of a little bit of pain to her right wrist, she thinks she did hit her head.  She does have a history of frequent falls.  She states she did not pass out she remembers falling.  She states she just lost her balance.  She has some pain to her buttocks but no hip pain.  She denies any pain shortness of breath nausea vomiting she is at her baseline mentally according to family at this time. No other history is offered.  No recent change in medications no recent illness no diarrhea no melena no bright red blood per rectum no headache no focal numbness or weakness no other alleviating or aggravating factors no other prior treatment.  Not on blood thinners according to family and ambulatory   Past Medical History:  Diagnosis Date  . Arthritis   . Chronic fatigue   . DM2 (diabetes mellitus, type 2) ([facility])   . Esophageal stricture   . Fibromyalgia   . GERD (gastroesophageal reflux disease)   . Hypertension   . Lumbar stenosis   . Osteoporosis   . Sleep apnea     Patient Active Problem List   Diagnosis Date Noted  . Cephalalgia 07/24/2015  . Neuritis or radiculitis due to rupture of lumbar intervertebral disc 01/05/2015  . Degeneration of intervertebral disc of cervical region 04/14/2014  . B12 deficiency 12/23/2013  . Benign essential HTN 12/23/2013  . Type 2 diabetes mellitus ([facility]) 12/23/2013  . Obstructive apnea 12/23/2013  . Lumbar  canal stenosis 09/18/2013    Past Surgical History:  Procedure Laterality Date  . ABDOMINAL HYSTERECTOMY    . APPENDECTOMY    . knee replacements    . ROTATOR CUFF REPAIR      Prior to Admission medications   Medication Sig Start Date End Date Taking? Authorizing Provider  bumetanide (BUMEX) 1 MG tablet Take 1 mg by mouth daily.  04/24/14   [provider]  busPIRone (BUSPAR) 15 MG tablet Take 15 mg by mouth 2 (two) times daily.    [provider]  DULoxetine (CYMBALTA) 60 MG capsule Take 60 mg by mouth daily.  04/24/14   [provider]  gabapentin (NEURONTIN) 300 MG capsule Take 300 mg by mouth at bedtime.  04/24/14   [provider]  lactulose (CHRONULAC) 10 GM/15ML solution Take 15 mLs (10 g total) by mouth daily as needed for severe constipation. Patient not taking: Reported on 10/26/2016 09/11/15   Rockne MenghiniNorman, Anne-Caroline, MD  losartan (COZAAR) 50 MG tablet Take 50 mg by mouth daily.     [provider]  metFORMIN (GLUCOPHAGE) 1000 MG tablet Take 1,000 mg by mouth 2 (two) times daily with a meal.    [provider]  nabumetone (RELAFEN) 500 MG tablet Take 500 mg by mouth 2 (two) times daily as needed.     [provider]  pravastatin (PRAVACHOL) 40 MG tablet Take 40 mg by mouth at bedtime.  10/13/14 10/29/15  [provider]  propranolol (INDERAL) 20 MG tablet Take 20 mg by mouth 2 (two) times daily.  04/24/14   [provider]  traZODone (DESYREL) 50 MG tablet Take 100 mg by mouth at bedtime.  04/24/14   [provider]  zolpidem (AMBIEN) 10 MG tablet Take 10 mg by mouth at bedtime. Take one tablet daily at bedtime    [provider]    Allergies Patient has no known allergies.  Family History  Problem Relation Age of Onset  . Diabetes Father   . Cancer Father   . Cerebral aneurysm Father   . Cancer Mother   . Cancer Sister   . Depression Sister     Social History Social History    Tobacco Use  . Smoking status: Never Smoker  . Smokeless tobacco: Never Used  Substance Use Topics  . Alcohol use: No    Alcohol/week: 0.0 standard drinks  . Drug use: No    Review of Systems Constitutional: No fever/chills Eyes: No visual changes. ENT: No sore throat. No stiff neck no neck pain Cardiovascular: Denies chest pain. Respiratory: Denies shortness of breath. Gastrointestinal:   no vomiting.  No diarrhea.  No constipation. Genitourinary: Negative for dysuria. Musculoskeletal: Negative lower extremity swelling Skin: Negative for rash. Neurological: Negative for severe headaches, focal weakness or numbness.   ____________________________________________   PHYSICAL EXAM:  VITAL SIGNS: ED Triage Vitals  Enc Vitals Group     BP 05/05/18 0815 106/83     Pulse Rate 05/05/18 0815 79     Resp 05/05/18 0815 16     Temp 05/05/18 0815 97.9 F (36.6 C)     Temp Source 05/05/18 0815 Oral     SpO2 05/05/18 0815 98 %     Weight 05/05/18 0813 158 lb 1.1 oz (71.7 kg)     Height --      Head Circumference --      Peak Flow --      Pain Score 05/05/18 0812 6     Pain Loc --      Pain Edu? --      Excl. in GC? --     Constitutional: Alert and oriented to name and place unsure of the exact date, at baseline per family. Well appearing and in no acute distress. Eyes: Conjunctivae are normal Head: Some mild bruising noticed the occiput into the right ear, HEENT: No congestion/rhinnorhea. Mucous membranes are moist.  Oropharynx non-erythematous Neck:   Nontender with no meningismus, no masses, no stridor Cardiovascular: Normal rate, regular rhythm. Grossly normal heart sounds.  Good peripheral circulation. Respiratory: Normal respiratory effort.  No retractions. Lungs CTAB. Abdominal: Soft and nontender. No distention. No guarding no rebound Back:  There is no focal tenderness or step off.  there is no midline tenderness there are no lesions noted. there is no CVA  tenderness Musculoskeletal: No lower extremity tenderness, no mild pain and swelling to the right wrist, good range of motion good pulses no elbow or shoulder pain.  No joint effusions, no DVT signs strong distal pulses no edema, full range of motion of both hips with no evidence of lower extremity injury Neurologic:  Normal speech and language. No gross focal neurologic deficits are appreciated.  Skin:  Skin is warm, dry and intact.  Small skin tear to the right arm, superficial, left leg, superficial in the pretibial region, abrasion to the occiput and some bruising around the ear Psychiatric: Mood and affect are normal. Speech and behavior  are normal.  ____________________________________________   LABS (all labs ordered are listed, but only abnormal results are displayed)  Labs Reviewed  CK  CBC WITH DIFFERENTIAL/PLATELET  TROPONIN I  BASIC METABOLIC PANEL  URINALYSIS, COMPLETE (UACMP) WITH MICROSCOPIC    Pertinent labs  results that were available during my care of the patient were reviewed by me and considered in my medical decision making (see chart for details). ____________________________________________  EKG  I personally interpreted any EKGs ordered by me or triage  ____________________________________________  RADIOLOGY  Pertinent labs & imaging results that were available during my care of the patient were reviewed by me and considered in my medical decision making (see chart for details). If possible, patient and/or family made aware of any abnormal findings.  No results found. ____________________________________________    PROCEDURES  Procedure(s) performed: None  Procedures  Critical Care performed: None  ____________________________________________   INITIAL IMPRESSION / ASSESSMENT AND PLAN / ED COURSE  Pertinent labs & imaging results that were available during my care of the patient were reviewed by me and considered in my medical decision making  (see chart for details).  Patient here after what sounds like a non-syncopal fall we will check a total CK is some confusion about how long she was down but it is thought to be less than 3 or 4 hours which likely would not result in rhabdomyolysis.  She has no evidence of compartment syndrome.  She has some wrist pain, which we will image, she has some skin tears which will not require any closure and she has some evidence of closed head injury, without being on blood thinners but given her age we will obtain CT head.  This does not sound like a syncopal event but will check basic blood work and we will reassess.  ----------------------------------------- 10:23 AM on 05/05/2018 -----------------------------------------  Patient CK is trivially elevated, however, it is also hemolyzed I do not think read sticking her is indicated there is no evidence of significant rhabdomyolysis we will however give her IV fluid.  I discussed with Dr. Hyacinth MeekerMiller, he looked at the x-ray for her arm.  We will place her in a sugar tong splint.  Patient is neurovascularly intact.  Does have some skin tears but we are managing appropriately.  Tetanus is up-to-date per family.  Otherwise well-appearing we will see if we can get her home with some family support. ----------------------------------------- 12:12 PM on 05/05/2018 -----------------------------------------  Encourage patient to take lots of p.o. at home no indication for admission, she is neurovascularly intact after splint placement.  I personally supervised blood placement.  Tech did perform splint placement.  We will discharge with close outpatient follow-up.   ____________________________________________   FINAL CLINICAL IMPRESSION(S) / ED DIAGNOSES  Final diagnoses:  None      This chart was dictated using voice recognition software.  Despite best efforts to proofread,  errors can occur which can change meaning.      Jeanmarie PlantMcShane,  A, MD 05/05/18  [id number]    Jeanmarie PlantMcShane,  A, MD 05/05/18 1023    Jeanmarie PlantMcShane,  A, MD 05/05/18 1213    Jeanmarie PlantMcShane,  A, MD 05/05/18 1213

## 2018-05-06 NOTE — Care Management (Signed)
RNCM was consulted during ED visit and ultimately consult was cancelled due to an agreement in the disposition between hospital and family. I spoke personally with Eunice BlaseDebbie who is the patients daughter and POA. She was upset that she was not with the patient yesterday and that the patient was allowed to be discharged. RNCM was told by ED staff that patient wanted to use Dr Hyacinth MeekerMiller to set up home health with [facility] as that's what they have done in the past. Unfortunately, once I talked to Eunice BlaseDebbie she does not want home health but is seeking rehab facilities until her mother has surgery on her wrist. I informed her that this was not really something that could be done while the patient is at home on Sunday. We decided that Eunice BlaseDebbie will call Dr Hyacinth MeekerMiller tomorrow to request home health Social work to assist with some type of placement assistance until surgery.

## 2018-05-29 DIAGNOSIS — R7989 Other specified abnormal findings of blood chemistry: Secondary | ICD-10-CM | POA: Insufficient documentation

## 2018-12-04 ENCOUNTER — Other Ambulatory Visit: Payer: Self-pay | Admitting: Internal Medicine

## 2018-12-04 DIAGNOSIS — E1169 Type 2 diabetes mellitus with other specified complication: Secondary | ICD-10-CM | POA: Insufficient documentation

## 2018-12-04 DIAGNOSIS — M5440 Lumbago with sciatica, unspecified side: Secondary | ICD-10-CM

## 2018-12-17 ENCOUNTER — Other Ambulatory Visit: Payer: Self-pay

## 2018-12-17 ENCOUNTER — Ambulatory Visit
Admission: RE | Admit: 2018-12-17 | Discharge: 2018-12-17 | Disposition: A | Discharge status: Home / Self Care | Payer: Medicare Other | Source: Ambulatory Visit | Attending: Internal Medicine | Admitting: Internal Medicine

## 2018-12-17 DIAGNOSIS — M5440 Lumbago with sciatica, unspecified side: Secondary | ICD-10-CM

## 2018-12-24 ENCOUNTER — Other Ambulatory Visit: Payer: Self-pay | Admitting: Internal Medicine

## 2018-12-24 DIAGNOSIS — M5440 Lumbago with sciatica, unspecified side: Secondary | ICD-10-CM

## 2019-01-12 ENCOUNTER — Other Ambulatory Visit: Payer: Self-pay

## 2019-01-12 ENCOUNTER — Ambulatory Visit
Admission: RE | Admit: 2019-01-12 | Discharge: 2019-01-12 | Disposition: A | Discharge status: Home / Self Care | Payer: Medicare Other | Source: Ambulatory Visit | Attending: Internal Medicine | Admitting: Internal Medicine

## 2019-01-12 DIAGNOSIS — M5440 Lumbago with sciatica, unspecified side: Secondary | ICD-10-CM

## 2019-01-22 ENCOUNTER — Other Ambulatory Visit: Payer: Medicare Other

## 2019-02-20 ENCOUNTER — Other Ambulatory Visit: Payer: Self-pay

## 2019-02-20 DIAGNOSIS — Z20822 Contact with and (suspected) exposure to covid-19: Secondary | ICD-10-CM

## 2019-02-21 LAB — NOVEL CORONAVIRUS, NAA: SARS-CoV-2, NAA: NOT DETECTED

## 2019-04-08 IMAGING — CT CT HEAD W/O CM
3 series · 15 of 46 positions shown, 18 images · non-contrast
Comparison: MRI brain 01/21/2018. No prior head CT.

CLINICAL DATA: 86-year-old who fell in her bathtub at home last
night and was unable to move herself out of the tub thereafter.
Scalp laceration to the RIGHT parietal region and bruising to the
RIGHT ear. Initial encounter.

EXAM:
CT HEAD WITHOUT CONTRAST
TECHNIQUE: Contiguous axial images were obtained from the base of the skull
through the vertex without intravenous contrast.

[Series 2: head wo · axial · 0.47mm/px · z∈[-163,-43]mm · 9 of 29 slices shown, 12 images]
[im 3/29  brain]
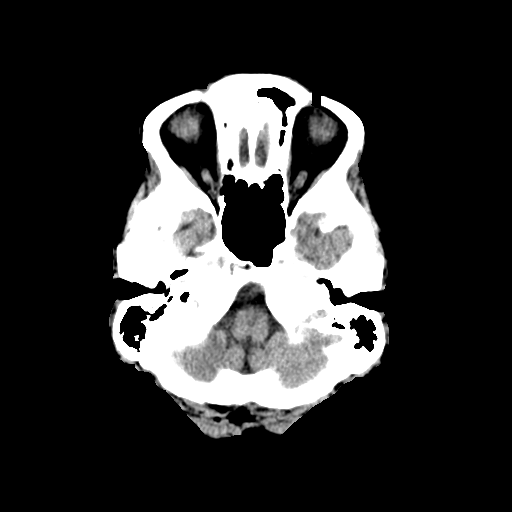
[im 3/29  bone]
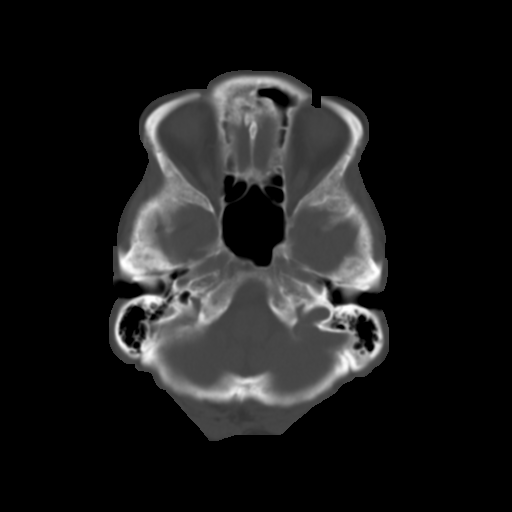
[im 6/29  brain]
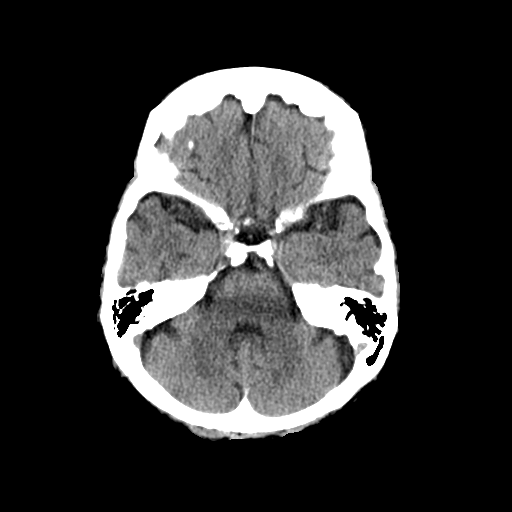
[im 9/29  brain]
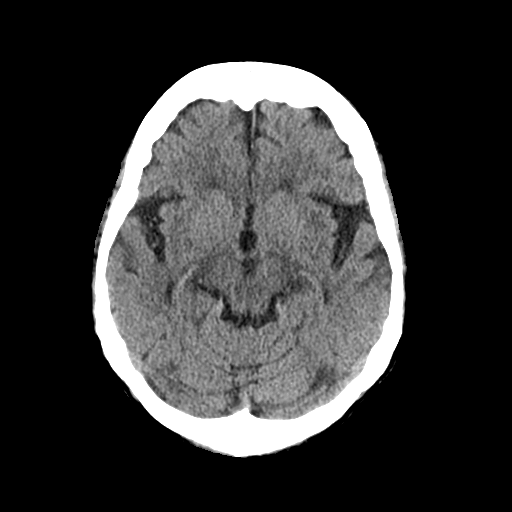
[im 12/29  brain]
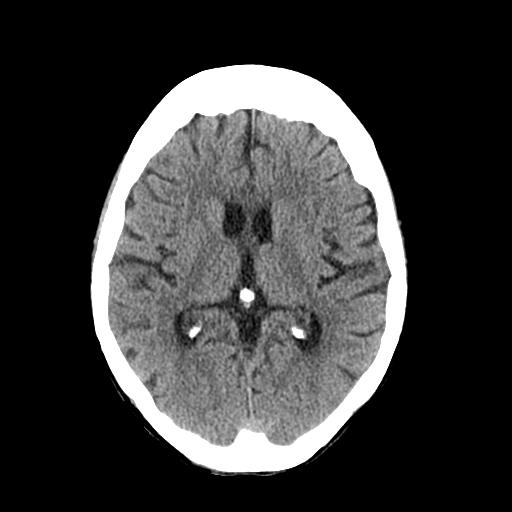
[im 15/29  brain]
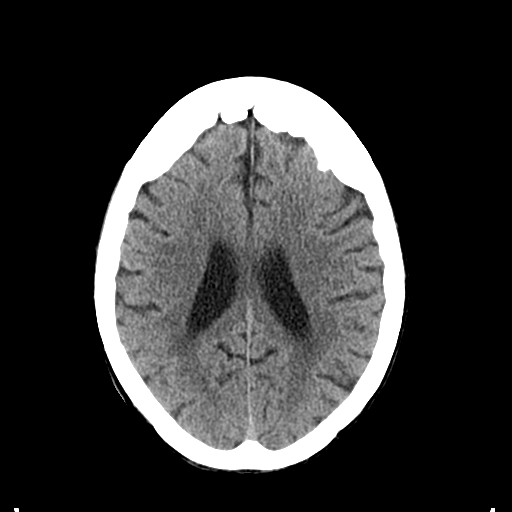
[im 15/29  bone]
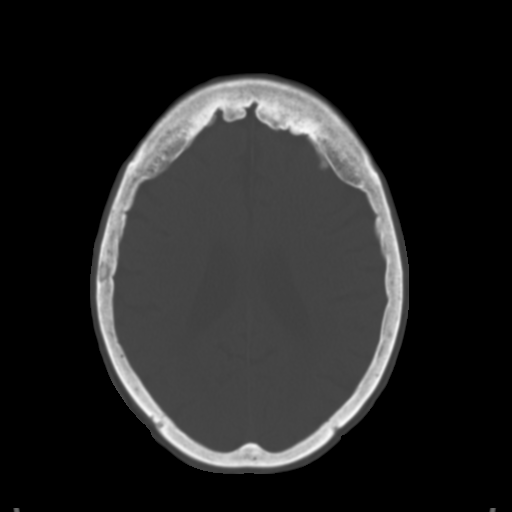
[im 18/29  brain]
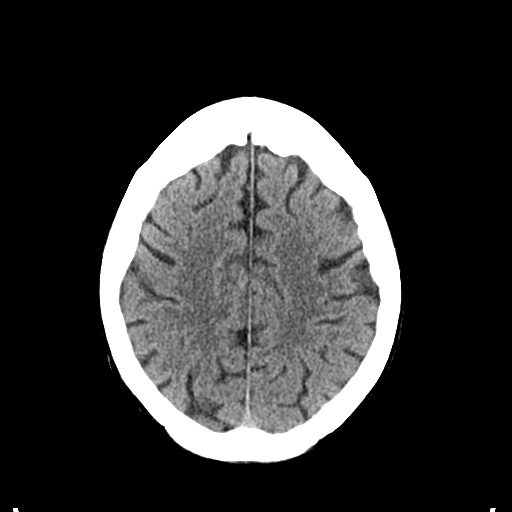
[im 21/29  brain]
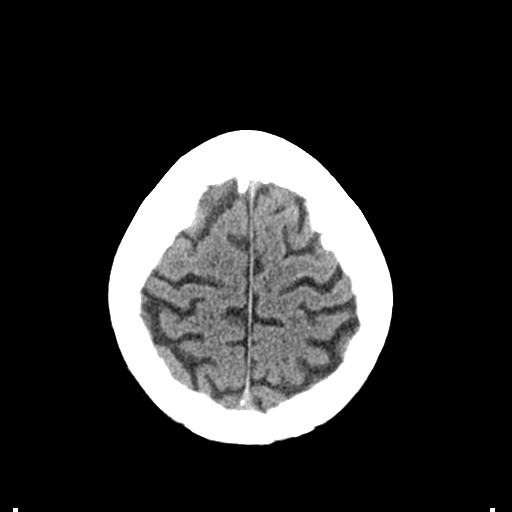
[im 24/29  brain]
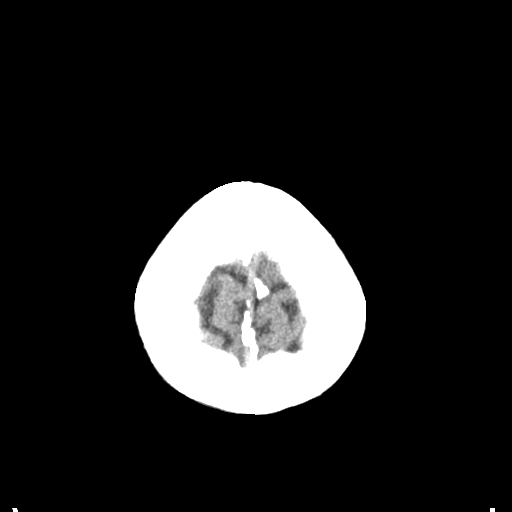
[im 27/29  brain]
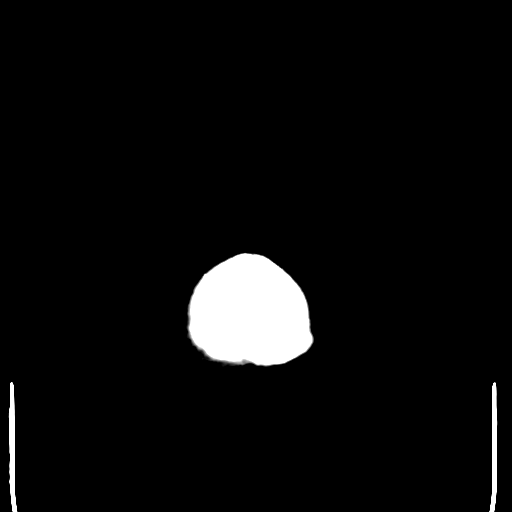
[im 27/29  bone]
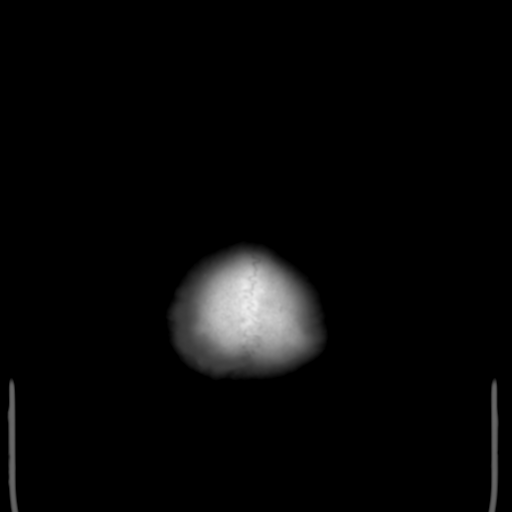

[Series 4: coronal soft tissue · coronal · 0.29mm/px · 3 of 69 slices shown]
[im 23/69  brain]
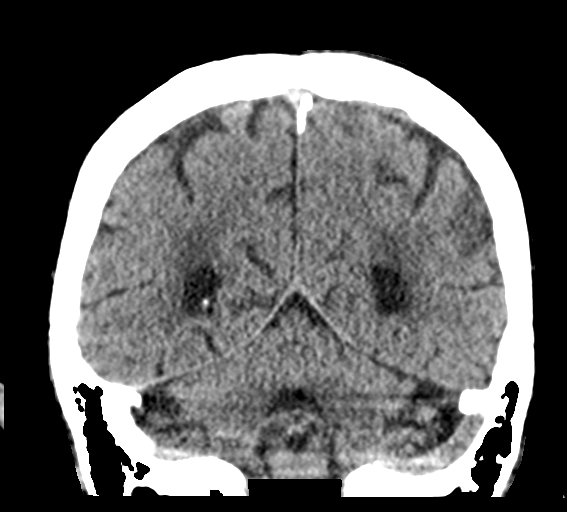
[im 31/69  brain]
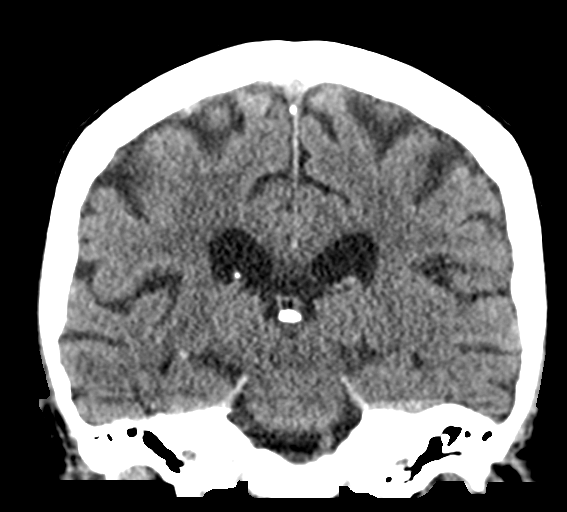
[im 38/69  brain]
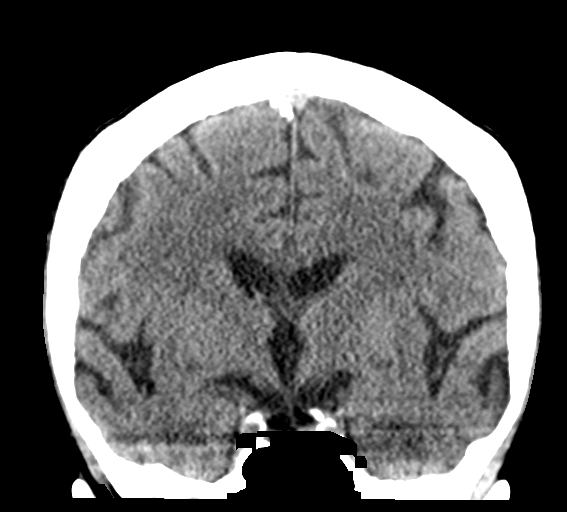

[Series 5: sagittal soft tissue · sagittal · 0.29mm/px · 3 of 52 slices shown]
[im 18/52  brain]
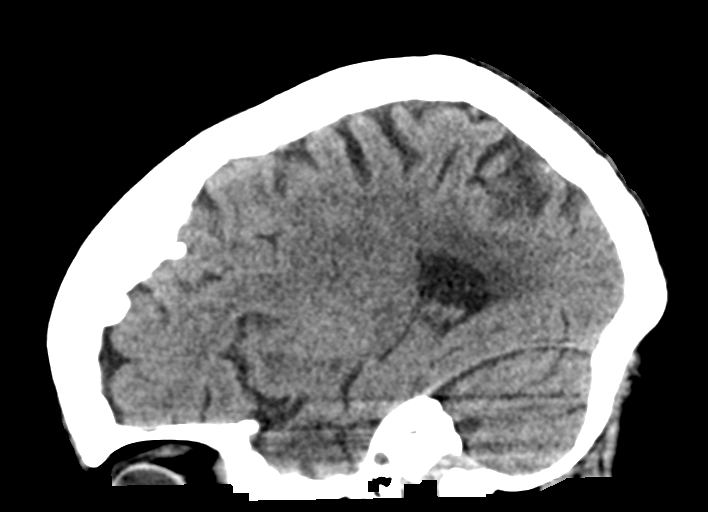
[im 26/52  brain]
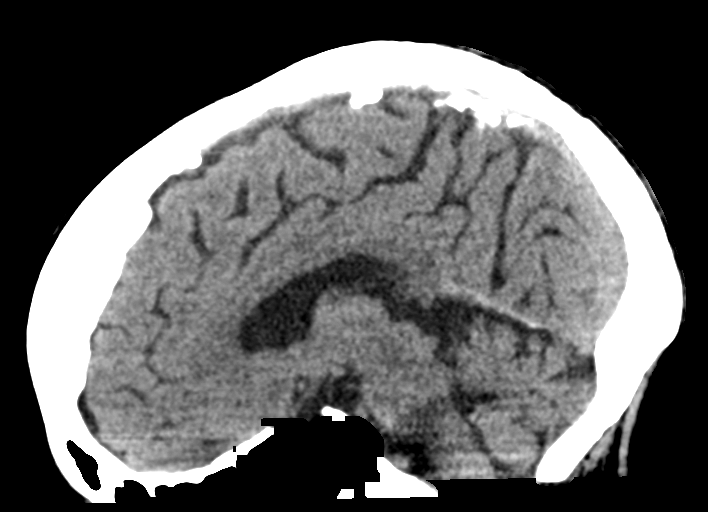
[im 35/52  brain]
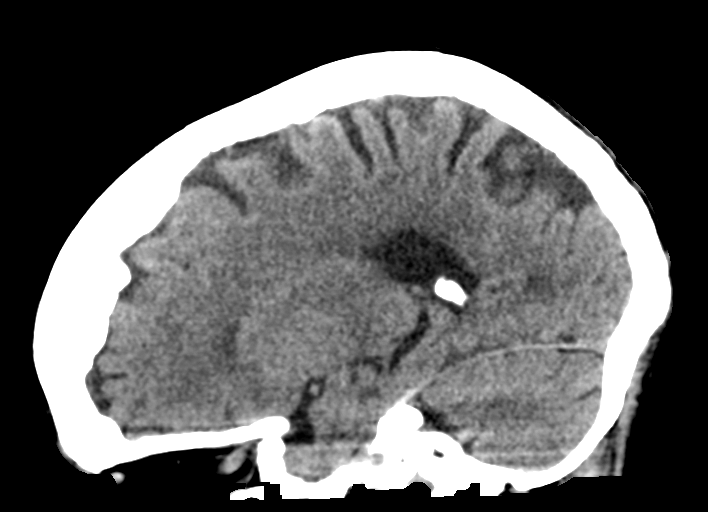

[15 of 46 positions shown; findings below may reference images not displayed]

FINDINGS: Brain: Mild age related cortical atrophy. Mild cerebellar atrophy.
Ventricular system normal in size and appearance for age. No mass
lesion. No midline shift. No acute hemorrhage or hematoma. No
extra-axial fluid collections. No evidence of acute infarction.

Vascular: Moderate to severe BILATERAL carotid siphon and mild RIGHT
vertebral artery atherosclerosis. LEFT vertebral artery atretic. No
hyperdense vessel.

Skull: Severe changes of benign hyperostosis frontalis interna
bilaterally. No skull fracture or other focal osseous abnormality
elsewhere involving the skull.

Sinuses/Orbits: Visualized paranasal sinuses, bilateral mastoid air
cells and bilateral middle ear cavities well-aerated. RIGHT frontal
sinus hypoplastic. Visualized orbits and globes normal in
appearance.

Other: Small RIGHT POSTERIOR parietal scalp hematoma.
IMPRESSION: 1. No acute intracranial abnormality.
2. Mild age related cortical atrophy.
3. Small RIGHT POSTERIOR parietal scalp hematoma without underlying
skull fracture.

## 2019-06-21 ENCOUNTER — Ambulatory Visit: Payer: Medicare Other | Attending: Internal Medicine

## 2019-09-02 DIAGNOSIS — M899 Disorder of bone, unspecified: Secondary | ICD-10-CM | POA: Insufficient documentation

## 2019-09-02 DIAGNOSIS — R937 Abnormal findings on diagnostic imaging of other parts of musculoskeletal system: Secondary | ICD-10-CM | POA: Insufficient documentation

## 2019-09-02 DIAGNOSIS — Z79899 Other long term (current) drug therapy: Secondary | ICD-10-CM | POA: Insufficient documentation

## 2019-09-02 DIAGNOSIS — G894 Chronic pain syndrome: Secondary | ICD-10-CM | POA: Insufficient documentation

## 2019-09-02 DIAGNOSIS — Z789 Other specified health status: Secondary | ICD-10-CM | POA: Insufficient documentation

## 2019-09-02 NOTE — Progress Notes (Signed)
Patient: Loretta Webb  Service Category: E/M  Provider: Gaspar Cola, MD  DOB: 1931-10-11  DOS: 09/03/2019  Referring Provider: Rusty Aus, MD  MRN: [id number]  Setting: Ambulatory outpatient  PCP: Rusty Aus, MD  Type: New Patient  Specialty: Interventional Pain Management    Location: Office  Delivery: Face-to-face     Primary Reason(s) for Visit: Encounter for initial evaluation of one or more chronic problems (new to examiner) potentially causing chronic pain, and posing a threat to normal musculoskeletal function. (Level of risk: High) CC: Shoulder Pain (left)  HPI  Loretta Webb is a 86 y.o. year old, female patient, who comes today to see [place] for the first time for an initial evaluation of her chronic pain. She has B12 deficiency; Benign essential HTN; DDD (degenerative disc disease), cervical; Type 2 diabetes mellitus ([place]); Cephalalgia; Neuritis or radiculitis due to rupture of lumbar intervertebral disc; Lumbar canal stenosis; OSA (obstructive sleep apnea); DM type 2 with diabetic mixed hyperlipidemia ([place]); Hyperlipidemia, mixed; Low serum vitamin D; Nocturnal hypoxia; Lumbar central spinal stenosis w/ neurogenic claudication; Chronic pain syndrome; Pharmacologic therapy; Disorder of skeletal system; Problems influencing health status; Abnormal MRI, lumbar spine (01/13/2019); Abnormal MRI, cervical spine (2014); Chronic hip pain (2ry area of Pain) (Bilateral) (R>L); Chronic low back pain (1ry area of Pain) (Bilateral) (R>L) w/o sciatica; Chronic shoulder pain (3ry area of Pain) (Bilateral) (L>R); Chronic lower extremity pain (4th area of Pain) (Bilateral) (R>L); Diabetic peripheral neuropathy ([place]); Neurogenic pain; Lumbar facet syndrome (Bilateral) (R>L); Lumbar facet hypertrophy; DDD (degenerative disc disease), lumbosacral; and Grade 1 Anterolisthesis of L3/L4 on their problem list. Today she comes in for evaluation of her Shoulder Pain (left)  Pain Assessment: Location: Left  Shoulder Radiating: left arm to the elbow Onset: More than a month ago Duration: Chronic pain Quality: Burning, Sharp Severity: 8 /10 (subjective, self-reported pain score)  Note: Reported level is compatible with observation.                         When using our objective Pain Scale, levels between 6 and 10/10 are said to belong in an emergency room, as it progressively worsens from a 6/10, described as severely limiting, requiring emergency care not usually available at an outpatient pain management facility. At a 6/10 level, communication becomes difficult and requires great effort. Assistance to reach the emergency department may be required. Facial flushing and profuse sweating along with potentially dangerous increases in heart rate and blood pressure will be evident. Effect on ADL: difficulty performing daily activities Timing: Constant Modifying factors: Tylenol, Biofreeze, Voltaren BP: 125/76  HR: (!) 121  Onset and Duration: Present longer than 3 months Cause of pain: Unknown Severity: No change since onset, NAS-11 at its worse: 10/10, NAS-11 at its best: 3/10, NAS-11 now: 9/10 and NAS-11 on the average: 5/10 Timing: Not influenced by the time of the day Aggravating Factors: Motion and Prolonged sitting Alleviating Factors: Medications and Nerve blocks Associated Problems: Constipation, Fatigue, Inability to control bladder (urine), Numbness, Swelling, Weakness, Pain that wakes patient up and Pain that does not allow patient to sleep Quality of Pain: Agonizing, Constant, Disabling, Exhausting, Sharp, Shooting, Throbbing and Uncomfortable Previous Examinations or Tests: MRI scan, Nerve block and Neurological evaluation Previous Treatments: Epidural steroid injections, Narcotic medications and Trigger point injections  The patient comes into the clinics today for the first time for a chronic pain management evaluation.  According to the patient her primary area of  pain is that of  the lower back, bilaterally, with the right being worse than the left.  The patient denies having had any back surgery but she does admit having had multiple nerve blocks in the area of the lower back by Dr. Sharlet Salina.  She indicated that they do seem to help and provide her with several months of pain relief.  She also admits to having had physical therapy for the low back pain approximately 1 year ago which did provide her with some temporary relief of the pain.  The patient also admits to having had an MRI of the lumbar spine done in [place].  The patient's secondary area pain is that of the hips, bilaterally, with the right being worse than the left.  She denies any hip surgeries but she does admit having had some joint injections by Dr. Sharlet Salina around May.  They do seem to help, although not all of them.  She denies any recent x-rays or physical therapy for the hip pain.  The patient's third area of pain is that of the shoulders, bilaterally, with the left being worse than the right.  She admits to having had bilateral shoulder cough surgery repair more than 10 years ago.  The surgery was performed by Dr. Mauri Pole and apparently it did help her pain.  Unfortunately, the shoulder pain has returned.  She also admits to recently having had some steroid joint injections, bilaterally, around March 9 of this year.  According to the patient and her daughter, they injection was apparently done by a nurse practitioner without any imaging guidance and although they seem to have helped the right shoulder, it did nothing for the left shoulder.  The left shoulder and arm seems to be increasingly worse.  She denies any physical therapy or recent x-rays of the shoulders.  The patient's fourth area of pain is that of the lower extremities, bilaterally, with the right being worse than the left.  In the case of the right lower extremity the pain goes down through the lateral aspect of the leg to the level of the knee.  She  does admit to having had bilateral total knee replacements more than 20 years ago.  In the case of the left lower extremity pain this also goes down the leg through the lateral aspect of her thigh to the level of the knee.  She also indicates having bilateral feet numbness encompassing the entire foot, which she thinks may be secondary to diabetic neuropathy.  She does admit to bilateral lower extremity weakness with a lot of difficulties walking.  She denies any type of nerve conduction testing.  The patient denies any blood thinners but does admit to having anxiety and occasionally experiencing panic attacks for which she takes Xanax.  I asked the patient the reason why there were being sent to [place] since Dr. Sharlet Salina seems to have already provided her with some interventional therapies and they indicated that there were increasingly frustrated due to the waiting times in Dr. Sharlet Salina office.  Today I took the time to provide the patient with information regarding my pain practice. The patient was informed that my practice is divided into two sections: an interventional pain management section, as well as a completely separate and distinct medication management section. I explained that I have procedure days for my interventional therapies, and evaluation days for follow-ups and medication management. Because of the amount of documentation required during both, they are kept separated. This means that there is the  possibility that she may be scheduled for a procedure on one day, and medication management the next. I have also informed her that because of staffing and facility limitations, I no longer take patients for medication management only. To illustrate the reasons for this, I gave the patient the example of surgeons, and how inappropriate it would be to refer a patient to his/her care, just to write for the post-surgical antibiotics on a surgery done by a different surgeon.   Because interventional pain  management is my board-certified specialty, the patient was informed that joining my practice means that they are open to any and all interventional therapies. I made it clear that this does not mean that they will be forced to have any procedures done. What this means is that I believe interventional therapies to be essential part of the diagnosis and proper management of chronic pain conditions. Therefore, patients not interested in these interventional alternatives will be better served under the care of a different practitioner.  The patient was also made aware of my Comprehensive Pain Management Safety Guidelines where by joining my practice, they limit all of their nerve blocks and joint injections to those done by our practice, for as long as we are retained to manage their care.   Historic Controlled Substance Pharmacotherapy Review  PMP and historical list of controlled substances: Alprazolam 0.25 mg p.o. daily; hydrocodone/APAP 5/325 1 tablet p.o. twice daily to 4 times daily Highest opioid analgesic regimen found: Tramadol 50 mg 1 tablet p.o. twice daily Most recent opioid analgesic: Hydrocodone/APAP 5/325 1 tablet p.o. twice daily Current opioid analgesics:  None Highest recorded MME/day: 10 mg/day MME/day: 0 mg/day  Medications: The patient did not bring the medication(s) to the appointment, as requested in our "New Patient Package" Pharmacodynamics: Desired effects: Analgesia: The patient reports >50% benefit. Reported improvement in function: The patient reports medication allows her to accomplish basic ADLs. Clinically meaningful improvement in function (CMIF): Sustained CMIF goals met Perceived effectiveness: Described as relatively effective, allowing for increase in activities of daily living (ADL) Undesirable effects: Side-effects or Adverse reactions: None reported Historical Monitoring: The patient  reports no history of drug use. List of all UDS Test(s): No results found  for: MDMA, COCAINSCRNUR, [place], [place], CANNABQUANT, THCU, [place] List of other Serum/Urine Drug Screening Test(s):  No results found for: AMPHSCRSER, BARBSCRSER, BENZOSCRSER, COCAINSCRSER, COCAINSCRNUR, PCPSCRSER, PCPQUANT, THCSCRSER, THCU, CANNABQUANT, OPIATESCRSER, OXYSCRSER, PROPOXSCRSER, ETH Historical Background Evaluation: [place] PMP: PDMP reviewed during this encounter. Six (6) year initial data search conducted.             PMP NARX Score Report:  Narcotic: 240 Sedative: 301 Stimulant: 000 [place] Department of public safety, offender search: Editor, commissioning Information) Non-contributory Risk Assessment Profile: Aberrant behavior: None observed or detected today Risk factors for fatal opioid overdose: None identified today PMP NARX Overdose Risk Score: 430 Fatal overdose hazard ratio (HR): Calculation deferred Non-fatal overdose hazard ratio (HR): Calculation deferred Risk of opioid abuse or dependence: 0.7-3.0% with doses ? 36 MME/day and 6.1-26% with doses ? 120 MME/day. Substance use disorder (SUD) risk level: See below Personal History of Substance Abuse (SUD-Substance use disorder):  Alcohol: Negative  Illegal Drugs: Negative  Rx Drugs: Negative  ORT Risk Level calculation: Low Risk Opioid Risk Tool - 09/03/19 1234      Family History of Substance Abuse   Alcohol  Negative    Illegal Drugs  Negative    Rx Drugs  Negative      Personal History of Substance  Abuse   Alcohol  Negative    Illegal Drugs  Negative    Rx Drugs  Negative      Age   Age between 96-45 years   No      History of Preadolescent Sexual Abuse   History of Preadolescent Sexual Abuse  Negative or Female      Psychological Disease   Psychological Disease  Negative    Depression  Negative      Total Score   Opioid Risk Tool Scoring  0    Opioid Risk Interpretation  Low Risk      ORT Scoring interpretation table:  Score <3 = Low Risk for SUD  Score between 4-7 = Moderate Risk for SUD  Score >8 = High  Risk for Opioid Abuse   PHQ-2 Depression Scale:  Total score: 0  PHQ-2 Scoring interpretation table: (Score and probability of major depressive disorder)  Score 0 = No depression  Score 1 = 15.4% Probability  Score 2 = 21.1% Probability  Score 3 = 38.4% Probability  Score 4 = 45.5% Probability  Score 5 = 56.4% Probability  Score 6 = 78.6% Probability   PHQ-9 Depression Scale:  Total score: 0  PHQ-9 Scoring interpretation table:  Score 0-4 = No depression  Score 5-9 = Mild depression  Score 10-14 = Moderate depression  Score 15-19 = Moderately severe depression  Score 20-27 = Severe depression (2.4 times higher risk of SUD and 2.89 times higher risk of overuse)   Pharmacologic Plan: As per protocol, I have not taken over any controlled substance management, pending the results of ordered tests and/or consults.            Initial impression: Pending review of available data and ordered tests.  Meds   Current Outpatient Medications:  .  acetaminophen (TYLENOL) 500 MG tablet, Take 500 mg by mouth 2 (two) times daily as needed., Disp: , Rfl:  .  ALPRAZolam (XANAX) 0.25 MG tablet, Take 0.25 mg by mouth., Disp: , Rfl:  .  bumetanide (BUMEX) 1 MG tablet, Take 1 mg by mouth daily. , Disp: , Rfl:  .  Cholecalciferol (VITAMIN D-1000 MAX ST) 25 MCG (1000 UT) tablet, Take 1,000 Units by mouth once a week., Disp: , Rfl:  .  Cyanocobalamin (VITAMIN B-12) 5000 MCG TBDP, Take 5,000 mcg by mouth daily., Disp: , Rfl:  .  DULoxetine (CYMBALTA) 60 MG capsule, Take 60 mg by mouth daily. , Disp: , Rfl:  .  losartan (COZAAR) 50 MG tablet, Take 50 mg by mouth daily. , Disp: , Rfl:  .  metFORMIN (GLUCOPHAGE) 1000 MG tablet, Take 1,000 mg by mouth 2 (two) times daily with a meal., Disp: , Rfl:  .  pantoprazole (PROTONIX) 20 MG tablet, Take 20 mg by mouth daily., Disp: , Rfl:  .  propranolol (INDERAL) 20 MG tablet, Take 20 mg by mouth 2 (two) times daily. , Disp: , Rfl:  .  traZODone (DESYREL) 50 MG  tablet, Take 100 mg by mouth at bedtime. , Disp: , Rfl:  .  pravastatin (PRAVACHOL) 40 MG tablet, Take 40 mg by mouth at bedtime. , Disp: , Rfl:   Imaging Review  Lumbosacral Imaging: Lumbar MR wo contrast:  Results for orders placed during the hospital encounter of 01/12/19  MR LUMBAR SPINE WO CONTRAST   Narrative CLINICAL DATA:  Acute left-sided low back pain with sciatica  EXAM: MRI LUMBAR SPINE WITHOUT CONTRAST  TECHNIQUE: Multiplanar, multisequence MR imaging of the lumbar spine  was performed. No intravenous contrast was administered.  COMPARISON:  01/21/2018  FINDINGS: Segmentation:  Partial segmentation of S1 from S2.  Alignment:  Degenerative grade 1 anterolisthesis at L2-3  Vertebrae:  No fracture, evidence of discitis, or bone lesion.  Conus medullaris and cauda equina: Conus extends to the L1-2 level. Conus and cauda equina appear normal.  Paraspinal and other soft tissues: No significant finding  Disc levels:  T12- L1: Disc narrowing and bulging.  No impingement  L1-L2: Disc narrowing and bulging with up turning central extrusion. Mild facet spurring. No impingement  L2-L3: Disc narrowing and bulging with posterior annular fissure. No impingement  L3-L4: Advanced facet degeneration with posterior element hypertrophy and bilateral joint effusion. There is anterolisthesis and disc bulging with advanced spinal stenosis. Noncompressive bilateral foraminal narrowing  L4-L5: Advanced facet degeneration with spurring and joint distortion. Anterolisthesis. Stable disc narrowing and bulging. Moderate spinal stenosis. Either L5 nerve root could be affected in the subarticular recesses.  L5-S1:Disc narrowing and mild bulging with a chronic left foraminal protrusion impinging on the L5 nerve root. Facet degeneration with moderate spurring.  IMPRESSION: 1. Stable from September 2019. 2. L3-4 and L4-5 advanced facet arthropathy with anterolisthesis and L3-4  advanced spinal stenosis. L4-5 spinal stenosis is moderate. 3. L5-S1 chronic left foraminal protrusion with L5 impingement.   Electronically Signed   By: Monte Fantasia M.D.   On: 01/13/2019 07:41    Wrist Imaging: Wrist-R DG Complete:  Results for orders placed during the hospital encounter of 05/05/18  DG Wrist Complete Right   Narrative CLINICAL DATA:  86 year old who fell in her bathtub at home and injured the RIGHT wrist. Initial encounter.  EXAM: RIGHT WRIST - COMPLETE 3+ VIEW  COMPARISON:  None.  FINDINGS: Mildly impacted acute fracture involving the distal radius with likely intra-articular extension. Associated mildly displaced acute ulnar styloid fracture. No fractures elsewhere. Osseous demineralization. Severe narrowing of the trapezium-first metacarpal joint space with intra-articular loose bodies. Severe narrowing of the first MCP joint space. Calcification involving the triangular fibrocartilage complex.  IMPRESSION: 1. Acute traumatic mildly impacted acute fracture involving the distal radius with likely intra-articular extension. 2. Associated mildly displaced acute ulnar styloid fracture. 3. Severe osteoarthritis. 4. Osseous demineralization.   Electronically Signed   By: Evangeline Dakin M.D.   On: 05/05/2018 09:35    Complexity Note: Imaging results reviewed. Results shared with Ms. Reas, using Layman's terms.                        ROS  Cardiovascular: High blood pressure Pulmonary or Respiratory: Snoring  Neurological: Abnormal skin sensations (Peripheral Neuropathy) and Incontinence:  Urinary Psychological-Psychiatric: Anxiousness and Difficulty sleeping and or falling asleep Gastrointestinal: Reflux or heatburn Genitourinary: No reported renal or genitourinary signs or symptoms such as difficulty voiding or producing urine, peeing blood, non-functioning kidney, kidney stones, difficulty emptying the bladder, difficulty controlling the flow  of urine, or chronic kidney disease Hematological: No reported hematological signs or symptoms such as prolonged bleeding, low or poor functioning platelets, bruising or bleeding easily, hereditary bleeding problems, low energy levels due to low hemoglobin or being anemic Endocrine: High blood sugar controlled without the use of insulin (NIDDM) Rheumatologic: Joint aches and or swelling due to excess weight (Osteoarthritis)   Musculoskeletal: Negative for myasthenia gravis, muscular dystrophy, multiple sclerosis or malignant hyperthermia Work History: Retired  Allergies  Ms. Schemm has No Known Allergies.  Laboratory Chemistry Profile   Renal Lab Results  Component Value  Date   BUN 13 05/05/2018   CREATININE 0.89 05/05/2018   GFRAA >60 05/05/2018   GFRNONAA 60 (L) 05/05/2018   PROTEINUR NEGATIVE 05/05/2018     Electrolytes Lab Results  Component Value Date   NA 134 (L) 05/05/2018   K 4.3 05/05/2018   CL 101 05/05/2018   CALCIUM 9.4 05/05/2018     Hepatic Lab Results  Component Value Date   AST 21 11/25/2016   ALT 10 (L) 11/25/2016   ALBUMIN 3.9 11/25/2016   ALKPHOS 61 11/25/2016   LIPASE 42 09/11/2015     ID Lab Results  Component Value Date   SARSCOV2NAA Not Detected 02/20/2019     Bone No results found for: [place], [id number], JA2505LZ7, [id number], 25OHVITD1, 25OHVITD2, 25OHVITD3, TESTOFREE, TESTOSTERONE   Endocrine Lab Results  Component Value Date   GLUCOSE 124 (H) 05/05/2018   GLUCOSEU NEGATIVE 05/05/2018     Neuropathy No results found for: VITAMINB12, FOLATE, HGBA1C, HIV   CNS No results found for: COLORCSF, APPEARCSF, RBCCOUNTCSF, WBCCSF, POLYSCSF, LYMPHSCSF, EOSCSF, PROTEINCSF, GLUCCSF, JCVIRUS, CSFOLI, IGGCSF, LABACHR, ACETBL, LABACHR, ACETBL   Inflammation (CRP: Acute  ESR: Chronic) No results found for: CRP, ESRSEDRATE, LATICACIDVEN   Rheumatology No results found for: RF, ANA, LABURIC, URICUR, LYMEIGGIGMAB, LYMEABIGMQN, HLAB27    Coagulation Lab Results  Component Value Date   PLT 263 05/05/2018     Cardiovascular Lab Results  Component Value Date   CKTOTAL 586 (H) 05/05/2018   TROPONINI <0.03 05/05/2018   HGB 12.5 05/05/2018   HCT 38.0 05/05/2018     Screening Lab Results  Component Value Date   SARSCOV2NAA Not Detected 02/20/2019     Cancer No results found for: CEA, CA125, LABCA2   Allergens No results found for: ALMOND, APPLE, ASPARAGUS, AVOCADO, BANANA, BARLEY, BASIL, BAYLE[place], GREENBEAN, LIMABEAN, WHITEBEAN, BEEFIGE, REDBEET, BLUEBERRY, BROCCOLI, CABBAGE, MELON, CARROT, CASEIN, CASHEWNUT, CAULIFLOWER, CELERY     Note: Lab results reviewed.  PFSH  Drug: Ms. Hemm  reports no history of drug use. Alcohol:  reports no history of alcohol use. Tobacco:  reports that she has never smoked. She has never used smokeless tobacco. Medical:  has a past medical history of Arthritis, Chronic fatigue, DM2 (diabetes mellitus, type 2) ([place]), Esophageal stricture, Fibromyalgia, GERD (gastroesophageal reflux disease), Hypertension, Lumbar stenosis, Osteoporosis, and Sleep apnea. Family: family history includes Cancer in her father, mother, and sister; Cerebral aneurysm in her father; Depression in her sister; Diabetes in her father.  Past Surgical History:  Procedure Laterality Date  . ABDOMINAL HYSTERECTOMY    . APPENDECTOMY    . knee replacements    . ROTATOR CUFF REPAIR     Active Ambulatory Problems    Diagnosis Date Noted  . B12 deficiency 12/23/2013  . Benign essential HTN 12/23/2013  . DDD (degenerative disc disease), cervical 04/14/2014  . Type 2 diabetes mellitus ([place]) 12/23/2013  . Cephalalgia 07/24/2015  . Neuritis or radiculitis due to rupture of lumbar intervertebral disc 01/05/2015  . Lumbar canal stenosis 09/18/2013  . OSA (obstructive sleep apnea) 12/23/2013  . DM type 2 with diabetic mixed hyperlipidemia ([place]) 12/04/2018  . Hyperlipidemia, mixed 10/28/2015  . Low serum vitamin D  05/29/2018  . Nocturnal hypoxia 05/04/2016  . Lumbar central spinal stenosis w/ neurogenic claudication 09/18/2013  . Chronic pain syndrome 09/02/2019  . Pharmacologic therapy 09/02/2019  . Disorder of skeletal system 09/02/2019  . Problems influencing health status 09/02/2019  . Abnormal MRI, lumbar spine (01/13/2019) 09/02/2019  . Abnormal MRI, cervical spine (2014) 09/02/2019  .  Chronic hip pain (2ry area of Pain) (Bilateral) (R>L) 09/03/2019  . Chronic low back pain (1ry area of Pain) (Bilateral) (R>L) w/o sciatica 09/03/2019  . Chronic shoulder pain (3ry area of Pain) (Bilateral) (L>R) 09/03/2019  . Chronic lower extremity pain (4th area of Pain) (Bilateral) (R>L) 09/03/2019  . Diabetic peripheral neuropathy ([place]) 09/03/2019  . Neurogenic pain 09/03/2019  . Lumbar facet syndrome (Bilateral) (R>L) 09/03/2019  . Lumbar facet hypertrophy 09/03/2019  . DDD (degenerative disc disease), lumbosacral 09/03/2019  . Grade 1 Anterolisthesis of L3/L4 09/03/2019   Resolved Ambulatory Problems    Diagnosis Date Noted  . No Resolved Ambulatory Problems   Past Medical History:  Diagnosis Date  . Arthritis   . Chronic fatigue   . DM2 (diabetes mellitus, type 2) ([place])   . Esophageal stricture   . Fibromyalgia   . GERD (gastroesophageal reflux disease)   . Hypertension   . Lumbar stenosis   . Osteoporosis   . Sleep apnea    Constitutional Exam  General appearance: Well nourished, well developed, and well hydrated. In no apparent acute distress Vitals:   09/03/19 1221  BP: 125/76  Pulse: (!) 121  Resp: 16  Temp: (!) 97.5 F (36.4 C)  TempSrc: Temporal  SpO2: 97%  Weight: 150 lb (68 kg)  Height: '5\' 2"'$  (1.575 m)   BMI Assessment: Estimated body mass index is 27.44 kg/m as calculated from the following:   Height as of this encounter: '5\' 2"'$  (1.575 m).   Weight as of this encounter: 150 lb (68 kg).  BMI interpretation table: BMI level Category Range association with higher  incidence of chronic pain  <18 kg/m2 Underweight   18.5-24.9 kg/m2 Ideal body weight   25-29.9 kg/m2 Overweight Increased incidence by 20%  30-34.9 kg/m2 Obese (Class I) Increased incidence by 68%  35-39.9 kg/m2 Severe obesity (Class II) Increased incidence by 136%  >40 kg/m2 Extreme obesity (Class III) Increased incidence by 254%   Patient's current BMI Ideal Body weight  Body mass index is 27.44 kg/m. Ideal body weight: 50.1 kg (110 lb 7.2 oz) Adjusted ideal body weight: 57.3 kg (126 lb 4.3 oz)   BMI Readings from Last 4 Encounters:  09/03/19 27.44 kg/m  05/05/18 27.13 kg/m  11/25/16 27.12 kg/m  10/29/15 26.09 kg/m   Wt Readings from Last 4 Encounters:  09/03/19 150 lb (68 kg)  05/05/18 158 lb 1.1 oz (71.7 kg)  11/25/16 158 lb (71.7 kg)  10/29/15 152 lb (68.9 kg)   Psych/Mental status: Alert, oriented x 3 (person, place, & time)       Eyes: PERLA Respiratory: No evidence of acute respiratory distress  Cervical Spine Exam  Skin & Axial Inspection: No masses, redness, edema, swelling, or associated skin lesions Alignment: Symmetrical Functional ROM: Unrestricted ROM      Stability: No instability detected Muscle Tone/Strength: Functionally intact. No obvious neuro-muscular anomalies detected. Sensory (Neurological): Unimpaired Palpation: No palpable anomalies              Upper Extremity (UE) Exam    Side: Right upper extremity  Side: Left upper extremity  Skin & Extremity Inspection: Skin color, temperature, and hair growth are WNL. No peripheral edema or cyanosis. No masses, redness, swelling, asymmetry, or associated skin lesions. No contractures.  Skin & Extremity Inspection: Skin color, temperature, and hair growth are WNL. No peripheral edema or cyanosis. No masses, redness, swelling, asymmetry, or associated skin lesions. No contractures.  Functional ROM: Decreased ROM for shoulder  Functional ROM: Decreased  ROM for shoulder  Muscle Tone/Strength: Functionally  intact. No obvious neuro-muscular anomalies detected.  Muscle Tone/Strength: Functionally intact. No obvious neuro-muscular anomalies detected.  Sensory (Neurological): Unimpaired          Sensory (Neurological): Unimpaired          Palpation: No palpable anomalies              Palpation: No palpable anomalies              Provocative Test(s):  Phalen's test: deferred Tinel's test: deferred Apley's scratch test (touch opposite shoulder):  Action 1 (Across chest): deferred Action 2 (Overhead): deferred Action 3 (LB reach): deferred   Provocative Test(s):  Phalen's test: deferred Tinel's test: deferred Apley's scratch test (touch opposite shoulder):  Action 1 (Across chest): deferred Action 2 (Overhead): deferred Action 3 (LB reach): deferred    Thoracic Spine Area Exam  Skin & Axial Inspection: No masses, redness, or swelling Alignment: Symmetrical Functional ROM: Unrestricted ROM Stability: No instability detected Muscle Tone/Strength: Functionally intact. No obvious neuro-muscular anomalies detected. Sensory (Neurological): Unimpaired Muscle strength & Tone: No palpable anomalies  Lumbar Exam  Skin & Axial Inspection: No masses, redness, or swelling Alignment: Symmetrical Functional ROM: Decreased ROM affecting both sides Stability: No instability detected Muscle Tone/Strength: Functionally intact. No obvious neuro-muscular anomalies detected. Sensory (Neurological): Unimpaired Palpation: No palpable anomalies       Provocative Tests: Hyperextension/rotation test: (+) bilaterally for facet joint pain. Lumbar quadrant test (Kemp's test): deferred today       Lateral bending test: deferred today       Patrick's Maneuver: deferred today                   FABER* test: deferred today                   S-I anterior distraction/compression test: deferred today         S-I lateral compression test: deferred today         S-I Thigh-thrust test: deferred today         S-I  Gaenslen's test: deferred today         *(Flexion, ABduction and External Rotation)  Gait & Posture Assessment  Ambulation: Patient came in today in a wheel chair Gait: Limited. Using assistive device to ambulate Posture: Difficulty standing up straight, due to pain   Lower Extremity Exam    Side: Right lower extremity  Side: Left lower extremity  Stability: No instability observed          Stability: No instability observed          Skin & Extremity Inspection: Skin color, temperature, and hair growth are WNL. No peripheral edema or cyanosis. No masses, redness, swelling, asymmetry, or associated skin lesions. No contractures.  Skin & Extremity Inspection: Skin color, temperature, and hair growth are WNL. No peripheral edema or cyanosis. No masses, redness, swelling, asymmetry, or associated skin lesions. No contractures.  Functional ROM: Unrestricted ROM                  Functional ROM: Unrestricted ROM                  Muscle Tone/Strength: Functionally intact. No obvious neuro-muscular anomalies detected.  Muscle Tone/Strength: Functionally intact. No obvious neuro-muscular anomalies detected.  Sensory (Neurological): Referred pain pattern        Sensory (Neurological): Referred pain pattern        DTR: Patellar: deferred today Achilles:  deferred today Plantar: deferred today  DTR: Patellar: deferred today Achilles: deferred today Plantar: deferred today  Palpation: No palpable anomalies  Palpation: No palpable anomalies   Assessment  Primary Diagnosis & Pertinent Problem List: The primary encounter diagnosis was Chronic pain syndrome. Diagnoses of Chronic hip pain (2ry area of Pain) (Bilateral) (R>L), Chronic low back pain (1ry area of Pain) (Bilateral) (R>L) w/o sciatica, Chronic shoulder pain (3ry area of Pain) (Bilateral) (L>R), Chronic lower extremity pain (4th area of Pain) (Bilateral) (R>L), Lumbar facet syndrome (Bilateral) (R>L), Lumbar facet hypertrophy, DDD (degenerative  disc disease), lumbosacral, Lumbar central spinal stenosis w/ neurogenic claudication, Anterolisthesis, Pharmacologic therapy, Disorder of skeletal system, Problems influencing health status, Abnormal MRI, lumbar spine (01/13/2019), Abnormal MRI, cervical spine (2014), Diabetic peripheral neuropathy ([place]), and Neurogenic pain were also pertinent to this visit.  Visit Diagnosis (New problems to examiner): 1. Chronic pain syndrome   2. Chronic hip pain (2ry area of Pain) (Bilateral) (R>L)   3. Chronic low back pain (1ry area of Pain) (Bilateral) (R>L) w/o sciatica   4. Chronic shoulder pain (3ry area of Pain) (Bilateral) (L>R)   5. Chronic lower extremity pain (4th area of Pain) (Bilateral) (R>L)   6. Lumbar facet syndrome (Bilateral) (R>L)   7. Lumbar facet hypertrophy   8. DDD (degenerative disc disease), lumbosacral   9. Lumbar central spinal stenosis w/ neurogenic claudication   10. Anterolisthesis   11. Pharmacologic therapy   12. Disorder of skeletal system   13. Problems influencing health status   14. Abnormal MRI, lumbar spine (01/13/2019)   15. Abnormal MRI, cervical spine (2014)   16. Diabetic peripheral neuropathy ([place])   17. Neurogenic pain    Plan of Care (Initial workup plan)  Note: Ms. Haser was reminded that as per protocol, today's visit has been an evaluation only. We have not taken over the patient's controlled substance management.  Problem-specific plan: No problem-specific Assessment & Plan notes found for this encounter.   Lab Orders     Compliance Drug Analysis, Ur     Comp. Metabolic Panel (12)     Magnesium     Vitamin B12     Sedimentation rate     25-Hydroxy vitamin D Lcms D2+D3     C-reactive protein  Imaging Orders     DG Lumbar Spine Complete W/Bend     DG Shoulder Right     DG Shoulder Left     DG HIP UNILAT W OR W/O PELVIS 2-3 VIEWS RIGHT     DG HIP UNILAT W OR W/O PELVIS 2-3 VIEWS LEFT Referral Orders  No referral(s) requested today     Procedure Orders     SUPRASCAPULAR NERVE BLOCK Pharmacotherapy (current): Medications ordered:  No orders of the defined types were placed in this encounter.  Medications administered during this visit: Ileigh S. Hall had no medications administered during this visit.   Pharmacological management options:  Opioid Analgesics: The patient was informed that there is no guarantee that she would be a candidate for opioid analgesics. The decision will be made following CDC guidelines. This decision will be based on the results of diagnostic studies, as well as Ms. Gutman's risk profile.   Membrane stabilizer: To be determined at a later time  Muscle relaxant: To be determined at a later time  NSAID: To be determined at a later time  Other analgesic(s): To be determined at a later time   Interventional management options: Ms. Lubinski was informed that there is no guarantee  that she would be a candidate for interventional therapies. The decision will be based on the results of diagnostic studies, as well as Ms. Veldman's risk profile.  Procedure(s) under consideration:  Diagnostic bilateral lumbar facet block #1  Diagnostic bilateral L3-4 TFESI #1  Diagnostic bilateral L4-5 TFESI #1  Diagnostic left versus bilateral shoulder joint injection #1  Diagnostic left versus bilateral suprascapular nerve block #1  Possible left versus bilateral suprascapular nerve RFA  Diagnostic bilateral intra-articular hip joint injection    Provider-requested follow-up: Return for Procedure (no sedation): (L) Suprascapular NB #1 (s/p Tests), (2V).  No future appointments.  Note by: Gaspar Cola, MD Date: 09/03/2019; Time: 4:25 PM

## 2019-09-03 ENCOUNTER — Ambulatory Visit: Payer: Medicare Other | Attending: Pain Medicine | Admitting: Pain Medicine

## 2019-09-03 ENCOUNTER — Other Ambulatory Visit: Payer: Self-pay

## 2019-09-03 ENCOUNTER — Encounter: Payer: Self-pay | Admitting: Pain Medicine

## 2019-09-03 VITALS — BP 125/76 | HR 121 | Temp 97.5°F | Resp 16 | Ht 62.0 in | Wt 150.0 lb

## 2019-09-03 DIAGNOSIS — M79604 Pain in right leg: Secondary | ICD-10-CM

## 2019-09-03 DIAGNOSIS — M79605 Pain in left leg: Secondary | ICD-10-CM | POA: Insufficient documentation

## 2019-09-03 DIAGNOSIS — M25512 Pain in left shoulder: Secondary | ICD-10-CM | POA: Diagnosis present

## 2019-09-03 DIAGNOSIS — M899 Disorder of bone, unspecified: Secondary | ICD-10-CM | POA: Diagnosis present

## 2019-09-03 DIAGNOSIS — M25552 Pain in left hip: Secondary | ICD-10-CM

## 2019-09-03 DIAGNOSIS — G894 Chronic pain syndrome: Secondary | ICD-10-CM

## 2019-09-03 DIAGNOSIS — M545 Low back pain, unspecified: Secondary | ICD-10-CM | POA: Insufficient documentation

## 2019-09-03 DIAGNOSIS — M4310 Spondylolisthesis, site unspecified: Secondary | ICD-10-CM | POA: Diagnosis present

## 2019-09-03 DIAGNOSIS — M25511 Pain in right shoulder: Secondary | ICD-10-CM | POA: Diagnosis present

## 2019-09-03 DIAGNOSIS — G8929 Other chronic pain: Secondary | ICD-10-CM | POA: Diagnosis present

## 2019-09-03 DIAGNOSIS — E1142 Type 2 diabetes mellitus with diabetic polyneuropathy: Secondary | ICD-10-CM | POA: Insufficient documentation

## 2019-09-03 DIAGNOSIS — M47816 Spondylosis without myelopathy or radiculopathy, lumbar region: Secondary | ICD-10-CM | POA: Diagnosis present

## 2019-09-03 DIAGNOSIS — M48062 Spinal stenosis, lumbar region with neurogenic claudication: Secondary | ICD-10-CM | POA: Insufficient documentation

## 2019-09-03 DIAGNOSIS — R937 Abnormal findings on diagnostic imaging of other parts of musculoskeletal system: Secondary | ICD-10-CM | POA: Diagnosis present

## 2019-09-03 DIAGNOSIS — M51379 Other intervertebral disc degeneration, lumbosacral region without mention of lumbar back pain or lower extremity pain: Secondary | ICD-10-CM

## 2019-09-03 DIAGNOSIS — M25551 Pain in right hip: Secondary | ICD-10-CM | POA: Diagnosis present

## 2019-09-03 DIAGNOSIS — Z79899 Other long term (current) drug therapy: Secondary | ICD-10-CM | POA: Insufficient documentation

## 2019-09-03 DIAGNOSIS — Z789 Other specified health status: Secondary | ICD-10-CM | POA: Insufficient documentation

## 2019-09-03 DIAGNOSIS — M792 Neuralgia and neuritis, unspecified: Secondary | ICD-10-CM | POA: Insufficient documentation

## 2019-09-03 DIAGNOSIS — M5137 Other intervertebral disc degeneration, lumbosacral region: Secondary | ICD-10-CM | POA: Diagnosis present

## 2019-09-03 NOTE — Patient Instructions (Signed)

## 2019-09-08 LAB — 25-HYDROXY VITAMIN D LCMS D2+D3
25-Hydroxy, Vitamin D-2: 3.4 ng/mL
25-Hydroxy, Vitamin D-3: 36 ng/mL
25-Hydroxy, Vitamin D: 39 ng/mL

## 2019-09-08 LAB — SEDIMENTATION RATE: Sed Rate: 17 mm/hr (ref 0–40)

## 2019-09-08 LAB — COMP. METABOLIC PANEL (12)
AST: 12 IU/L (ref 0–40)
Albumin/Globulin Ratio: 1.8 (ref 1.2–2.2)
Albumin: 4.3 g/dL (ref 3.6–4.6)
Alkaline Phosphatase: 50 IU/L (ref 39–117)
BUN/Creatinine Ratio: 10 — ABNORMAL LOW (ref 12–28)
BUN: 14 mg/dL (ref 8–27)
Bilirubin Total: 0.3 mg/dL (ref 0.0–1.2)
Calcium: 9.8 mg/dL (ref 8.7–10.3)
Chloride: 104 mmol/L (ref 96–106)
Creatinine, Ser: 1.44 mg/dL — ABNORMAL HIGH (ref 0.57–1.00)
GFR calc Af Amer: 38 mL/min/{1.73_m2} — ABNORMAL LOW (ref >59)
GFR calc non Af Amer: 33 mL/min/{1.73_m2} — ABNORMAL LOW (ref >59)
Globulin, Total: 2.4 g/dL (ref 1.5–4.5)
Glucose: 111 mg/dL — ABNORMAL HIGH (ref 65–99)
Potassium: 4.1 mmol/L (ref 3.5–5.2)
Sodium: 140 mmol/L (ref 134–144)
Total Protein: 6.7 g/dL (ref 6.0–8.5)

## 2019-09-08 LAB — MAGNESIUM: Magnesium: 2 mg/dL (ref 1.6–2.3)

## 2019-09-08 LAB — C-REACTIVE PROTEIN: CRP: <1 mg/L (ref 0–10)

## 2019-09-08 LAB — VITAMIN B12: Vitamin B-12: >2000 pg/mL — ABNORMAL HIGH (ref 232–1245)

## 2019-12-05 DIAGNOSIS — F03A0 Unspecified dementia, mild, without behavioral disturbance, psychotic disturbance, mood disturbance, and anxiety: Secondary | ICD-10-CM | POA: Insufficient documentation

## 2020-01-08 ENCOUNTER — Other Ambulatory Visit: Payer: Self-pay | Admitting: Orthopedic Surgery

## 2020-01-08 DIAGNOSIS — G8929 Other chronic pain: Secondary | ICD-10-CM

## 2020-01-08 DIAGNOSIS — M25512 Pain in left shoulder: Secondary | ICD-10-CM

## 2020-01-08 NOTE — Progress Notes (Signed)
L shoulder pain.

## 2020-01-20 ENCOUNTER — Ambulatory Visit
Payer: Medicare Other | Attending: Student in an Organized Health Care Education/Training Program | Admitting: Student in an Organized Health Care Education/Training Program

## 2020-01-20 ENCOUNTER — Encounter: Payer: Self-pay | Admitting: Student in an Organized Health Care Education/Training Program

## 2020-01-20 ENCOUNTER — Other Ambulatory Visit: Payer: Self-pay

## 2020-01-20 VITALS — BP 125/88 | HR 82 | Temp 98.5°F | Resp 16 | Ht 64.0 in | Wt 151.0 lb

## 2020-01-20 DIAGNOSIS — M79605 Pain in left leg: Secondary | ICD-10-CM | POA: Insufficient documentation

## 2020-01-20 DIAGNOSIS — M19012 Primary osteoarthritis, left shoulder: Secondary | ICD-10-CM | POA: Diagnosis present

## 2020-01-20 DIAGNOSIS — G8929 Other chronic pain: Secondary | ICD-10-CM

## 2020-01-20 DIAGNOSIS — M47816 Spondylosis without myelopathy or radiculopathy, lumbar region: Secondary | ICD-10-CM

## 2020-01-20 DIAGNOSIS — M25511 Pain in right shoulder: Secondary | ICD-10-CM | POA: Insufficient documentation

## 2020-01-20 DIAGNOSIS — M51379 Other intervertebral disc degeneration, lumbosacral region without mention of lumbar back pain or lower extremity pain: Secondary | ICD-10-CM

## 2020-01-20 DIAGNOSIS — M25512 Pain in left shoulder: Secondary | ICD-10-CM | POA: Diagnosis present

## 2020-01-20 DIAGNOSIS — M25551 Pain in right hip: Secondary | ICD-10-CM | POA: Insufficient documentation

## 2020-01-20 DIAGNOSIS — R937 Abnormal findings on diagnostic imaging of other parts of musculoskeletal system: Secondary | ICD-10-CM | POA: Diagnosis present

## 2020-01-20 DIAGNOSIS — M79604 Pain in right leg: Secondary | ICD-10-CM | POA: Insufficient documentation

## 2020-01-20 DIAGNOSIS — M25552 Pain in left hip: Secondary | ICD-10-CM | POA: Insufficient documentation

## 2020-01-20 DIAGNOSIS — G894 Chronic pain syndrome: Secondary | ICD-10-CM

## 2020-01-20 DIAGNOSIS — Z8739 Personal history of other diseases of the musculoskeletal system and connective tissue: Secondary | ICD-10-CM

## 2020-01-20 DIAGNOSIS — M19011 Primary osteoarthritis, right shoulder: Secondary | ICD-10-CM | POA: Diagnosis present

## 2020-01-20 DIAGNOSIS — M5137 Other intervertebral disc degeneration, lumbosacral region: Secondary | ICD-10-CM | POA: Diagnosis present

## 2020-01-20 DIAGNOSIS — M545 Low back pain: Secondary | ICD-10-CM | POA: Diagnosis present

## 2020-01-20 NOTE — Progress Notes (Signed)
Patient: Loretta Webb  Service Category: E/M  Provider: Gillis Santa, MD  DOB: 1931-11-11  DOS: 01/20/2020  Referring Provider: Rusty Aus, MD  MRN: [id number]  Setting: Ambulatory outpatient  PCP: Rusty Aus, MD  Type: New Patient  Specialty: Interventional Pain Management    Location: Office  Delivery: Face-to-face     Primary Reason(s) for Visit: Encounter for initial evaluation of one or more chronic problems (new to examiner) potentially causing chronic pain, and posing a threat to normal musculoskeletal function. (Level of risk: High) CC: Shoulder Pain (bilat, left is worse)  HPI  Loretta Webb is a 86 y.o. year old, female patient, who comes for the first time to our practice referred by Rusty Aus, MD for our initial evaluation of her chronic pain. She has B12 deficiency; Benign essential HTN; DDD (degenerative disc disease), cervical; Type 2 diabetes mellitus ([place]); Cephalalgia; Neuritis or radiculitis due to rupture of lumbar intervertebral disc; Lumbar canal stenosis; OSA (obstructive sleep apnea); DM type 2 with diabetic mixed hyperlipidemia ([place]); Hyperlipidemia, mixed; Low serum vitamin D; Nocturnal hypoxia; Lumbar central spinal stenosis w/ neurogenic claudication; Chronic pain syndrome; Pharmacologic therapy; Disorder of skeletal system; Problems influencing health status; Abnormal MRI, lumbar spine (01/13/2019); Abnormal MRI, cervical spine (2014); Chronic hip pain (2ry area of Pain) (Bilateral) (R>L); Chronic low back pain (1ry area of Pain) (Bilateral) (R>L) w/o sciatica; Chronic shoulder pain (3ry area of Pain) (Bilateral) (L>R); Chronic lower extremity pain (4th area of Pain) (Bilateral) (R>L); Diabetic peripheral neuropathy ([place]); Neurogenic pain; Lumbar facet syndrome (Bilateral) (R>L); Lumbar facet hypertrophy; DDD (degenerative disc disease), lumbosacral; Grade 1 Anterolisthesis of L3/L4; Localized osteoarthritis of shoulder regions, bilateral; and History of rotator cuff  syndrome (bilateral) on their problem list. Today she comes in for evaluation of her Shoulder Pain (bilat, left is worse)  Pain Assessment: Location: Right, Left (left is worse) Shoulder Radiating: denies Onset: More than a month ago Duration: Chronic pain Quality: Sharp Severity: 0-No pain (Typically "10"; sitting still today and with 1/2 hydro on board this am)/10 (subjective, self-reported pain score)  Effect on ADL: limits daily activity Timing: Constant Modifying factors: hydrocodone, voltaren, tylenol BP: 125/88  HR: 82  Onset and Duration: Present longer than 3 months Cause of pain: Unknown Severity: No change since onset, NAS-11 at its worse: 10/10, NAS-11 at its best: 3/10, NAS-11 now: 9/10 and NAS-11 on the average: 5/10 Timing: Not influenced by the time of the day Aggravating Factors: Lifiting, Motion and Prolonged sitting Alleviating Factors: Medications and Nerve blocks Associated Problems: Constipation, Fatigue, Inability to control bladder (urine), Numbness, Swelling, Weakness, Pain that wakes patient up and Pain that does not allow patient to sleep Quality of Pain: Agonizing, Constant, Disabling, Exhausting, Sharp, Shooting, Throbbing and Uncomfortable Previous Examinations or Tests: MRI scan, Nerve block and Neurological evaluation Previous Treatments: Epidural steroid injections, Narcotic medications and Trigger point injections  Patient presents to the pain management clinic with chronic pain localized to her bilateral shoulders, low back right greater than left.  Her primary pain today is her shoulder pain, left greater than right.  She has had bilateral shoulder rotator cuff surgery more than 10 years ago.  She has had steroid injections in the past with Dr. Sharlet Salina including glenohumeral joint steroid injections and subacromial bursa injections.  These provided mild to moderate relief at best.  She does have limited range of motion of bilateral shoulders, primarily  abduction.  In regards to her low back pain, patient has had multiple nerve  blocks as well as transforaminal epidural steroid injections I did provide her with several months of pain relief.  She did physical therapy for her low back in 2020 which did provide her with some pain relief.  Patient also endorses persistent right hip pain that is worse with weightbearing.  She has had previous right intra-articular hip steroid injections as well as right trochanteric bursa injection which were helpful for her pain.  She is interested in repeating these.  Patient also has a history of neck pain for which she has had cervical epidural steroid injection, trapezius trigger point injections. She was given a glenohumeral joint corticosteroid injections bilaterally at that visit. She then had a subacromial corticosteroid injection by Dr. Sharlet Salina on 11/27/2019. She has had multiple corticosteroid injections the subacromial space as well as 1 to the glenohumeral joint by Dr. Candelaria Stagers. She currently notes significant pain in the shoulder with difficulty with any sort of movement. She is currently taking multiple Tylenol tablets daily. She cannot take NSAIDs. She is taking occasional hydrocodone for breakthrough pain.  She takes this medication very intermittently.  She does have a history of dementia.  She lives by herself.  She has both of her daughters that check on her.   Meds   Current Outpatient Medications:  .  acetaminophen (TYLENOL) 500 MG tablet, Take 500 mg by mouth 2 (two) times daily as needed., Disp: , Rfl:  .  ALPRAZolam (XANAX) 0.25 MG tablet, Take 0.25 mg by mouth., Disp: , Rfl:  .  apixaban (ELIQUIS) 2.5 MG TABS tablet, Take by mouth 2 (two) times daily., Disp: , Rfl:  .  bumetanide (BUMEX) 1 MG tablet, Take 1 mg by mouth daily. , Disp: , Rfl:  .  Cholecalciferol (VITAMIN D-1000 MAX ST) 25 MCG (1000 UT) tablet, Take 1,000 Units by mouth once a week., Disp: , Rfl:  .  Cyanocobalamin (VITAMIN B-12)  5000 MCG TBDP, Take 5,000 mcg by mouth daily., Disp: , Rfl:  .  diclofenac Sodium ([employer] DICLOFENAC SODIUM) 1 % GEL, Apply topically 4 (four) times daily., Disp: , Rfl:  .  DULoxetine (CYMBALTA) 60 MG capsule, Take 60 mg by mouth daily. , Disp: , Rfl:  .  HYDROcodone-acetaminophen (NORCO/VICODIN) 5-325 MG tablet, Take 1 tablet by mouth every 6 (six) hours as needed for moderate pain., Disp: , Rfl:  .  metFORMIN (GLUCOPHAGE) 1000 MG tablet, Take 1,000 mg by mouth 2 (two) times daily with a meal., Disp: , Rfl:  .  pantoprazole (PROTONIX) 20 MG tablet, Take 20 mg by mouth daily., Disp: , Rfl:  .  propranolol (INDERAL) 20 MG tablet, Take 20 mg by mouth 2 (two) times daily. , Disp: , Rfl:  .  traZODone (DESYREL) 50 MG tablet, Take 100 mg by mouth at bedtime. , Disp: , Rfl:  .  losartan (COZAAR) 50 MG tablet, Take 50 mg by mouth daily.  (Patient not taking: Reported on 01/20/2020), Disp: , Rfl:  .  pravastatin (PRAVACHOL) 40 MG tablet, Take 40 mg by mouth at bedtime. , Disp: , Rfl:   Imaging Review     Lumbosacral Imaging: Lumbar MR wo contrast: Results for orders placed during the hospital encounter of 01/12/19  MR LUMBAR SPINE WO CONTRAST  Narrative CLINICAL DATA:  Acute left-sided low back pain with sciatica  EXAM: MRI LUMBAR SPINE WITHOUT CONTRAST  TECHNIQUE: Multiplanar, multisequence MR imaging of the lumbar spine was performed. No intravenous contrast was administered.  COMPARISON:  01/21/2018  FINDINGS: Segmentation:  Partial segmentation  of S1 from S2.  Alignment:  Degenerative grade 1 anterolisthesis at L2-3  Vertebrae:  No fracture, evidence of discitis, or bone lesion.  Conus medullaris and cauda equina: Conus extends to the L1-2 level. Conus and cauda equina appear normal.  Paraspinal and other soft tissues: No significant finding  Disc levels:  T12- L1: Disc narrowing and bulging.  No impingement  L1-L2: Disc narrowing and bulging with up turning central  extrusion. Mild facet spurring. No impingement  L2-L3: Disc narrowing and bulging with posterior annular fissure. No impingement  L3-L4: Advanced facet degeneration with posterior element hypertrophy and bilateral joint effusion. There is anterolisthesis and disc bulging with advanced spinal stenosis. Noncompressive bilateral foraminal narrowing  L4-L5: Advanced facet degeneration with spurring and joint distortion. Anterolisthesis. Stable disc narrowing and bulging. Moderate spinal stenosis. Either L5 nerve root could be affected in the subarticular recesses.  L5-S1:Disc narrowing and mild bulging with a chronic left foraminal protrusion impinging on the L5 nerve root. Facet degeneration with moderate spurring.  IMPRESSION: 1. Stable from September 2019. 2. L3-4 and L4-5 advanced facet arthropathy with anterolisthesis and L3-4 advanced spinal stenosis. L4-5 spinal stenosis is moderate. 3. L5-S1 chronic left foraminal protrusion with L5 impingement.   Electronically Signed By: Monte Fantasia M.D. On: 01/13/2019 07:41    DG Wrist Complete Right  Narrative CLINICAL DATA:  86 year old who fell in her bathtub at home and injured the RIGHT wrist. Initial encounter.  EXAM: RIGHT WRIST - COMPLETE 3+ VIEW  COMPARISON:  None.  FINDINGS: Mildly impacted acute fracture involving the distal radius with likely intra-articular extension. Associated mildly displaced acute ulnar styloid fracture. No fractures elsewhere. Osseous demineralization. Severe narrowing of the trapezium-first metacarpal joint space with intra-articular loose bodies. Severe narrowing of the first MCP joint space. Calcification involving the triangular fibrocartilage complex.  IMPRESSION: 1. Acute traumatic mildly impacted acute fracture involving the distal radius with likely intra-articular extension. 2. Associated mildly displaced acute ulnar styloid fracture. 3. Severe osteoarthritis. 4. Osseous  demineralization.   Electronically Signed By: Evangeline Dakin M.D. On: 05/05/2018 09:35   Complexity Note: Imaging results reviewed. Results shared with Loretta Webb, using Layman's terms.                         ROS  Cardiovascular: High blood pressure Pulmonary or Respiratory: Snoring  Neurological: Abnormal skin sensations (Peripheral Neuropathy) and Incontinence:  Urinary Psychological-Psychiatric: Anxiousness and Difficulty sleeping and or falling asleep Gastrointestinal: Reflux or heatburn and Irregular, infrequent bowel movements (Constipation) Genitourinary: No reported renal or genitourinary signs or symptoms such as difficulty voiding or producing urine, peeing blood, non-functioning kidney, kidney stones, difficulty emptying the bladder, difficulty controlling the flow of urine, or chronic kidney disease Hematological: No reported hematological signs or symptoms such as prolonged bleeding, low or poor functioning platelets, bruising or bleeding easily, hereditary bleeding problems, low energy levels due to low hemoglobin or being anemic Endocrine: High blood sugar controlled without the use of insulin (NIDDM) Rheumatologic: Joint aches and or swelling due to excess weight (Osteoarthritis) Musculoskeletal: Negative for myasthenia gravis, muscular dystrophy, multiple sclerosis or malignant hyperthermia  Allergies  Loretta Webb has No Known Allergies.  Laboratory Chemistry Profile   Renal Lab Results  Component Value Date   BUN 14 09/03/2019   CREATININE 1.44 (H) 09/03/2019   BCR 10 (L) 09/03/2019   GFRAA 38 (L) 09/03/2019   GFRNONAA 33 (L) 09/03/2019   PROTEINUR NEGATIVE 05/05/2018     Electrolytes Lab Results  Component Value Date   NA 140 09/03/2019   K 4.1 09/03/2019   CL 104 09/03/2019   CALCIUM 9.8 09/03/2019   MG 2.0 09/03/2019     Hepatic Lab Results  Component Value Date   AST 12 09/03/2019   ALT 10 (L) 11/25/2016   ALBUMIN 4.3 09/03/2019   ALKPHOS  50 09/03/2019   LIPASE 42 09/11/2015     ID Lab Results  Component Value Date   SARSCOV2NAA Not Detected 02/20/2019     Bone Lab Results  Component Value Date   25OHVITD1 39 09/03/2019   25OHVITD2 3.4 09/03/2019   25OHVITD3 36 09/03/2019     Endocrine Lab Results  Component Value Date   GLUCOSE 111 (H) 09/03/2019   GLUCOSEU NEGATIVE 05/05/2018     Neuropathy Lab Results  Component Value Date   VITAMINB12 >2000 (H) 09/03/2019     CNS No results found for: COLORCSF, APPEARCSF, RBCCOUNTCSF, WBCCSF, POLYSCSF, LYMPHSCSF, EOSCSF, PROTEINCSF, GLUCCSF, JCVIRUS, CSFOLI, IGGCSF, LABACHR, ACETBL, LABACHR, ACETBL   Inflammation (CRP: Acute  ESR: Chronic) Lab Results  Component Value Date   CRP <1 09/03/2019   ESRSEDRATE 17 09/03/2019     Rheumatology No results found for: RF, ANA, LABURIC, URICUR, LYMEIGGIGMAB, LYMEABIGMQN, HLAB27   Coagulation Lab Results  Component Value Date   PLT 263 05/05/2018     Cardiovascular Lab Results  Component Value Date   CKTOTAL 586 (H) 05/05/2018   TROPONINI <0.03 05/05/2018   HGB 12.5 05/05/2018   HCT 38.0 05/05/2018     Screening Lab Results  Component Value Date   SARSCOV2NAA Not Detected 02/20/2019     Cancer No results found for: CEA, CA125, LABCA2   Allergens No results found for: ALMOND, APPLE, ASPARAGUS, AVOCADO, BANANA, BARLEY, BASIL, BAYLEAF, GREENBEAN, LIMABEAN, WHITEBEAN, BEEFIGE, REDBEET, BLUEBERRY, BROCCOLI, CABBAGE, MELON, CARROT, CASEIN, CASHEWNUT, CAULIFLOWER, CELERY     Note: Lab results reviewed.  PFSH  Drug: Loretta Webb  reports no history of drug use. Alcohol:  reports no history of alcohol use. Tobacco:  reports that she has never smoked. She has never used smokeless tobacco. Medical:  has a past medical history of Arthritis, Chronic fatigue, DM2 (diabetes mellitus, type 2) ([place]), Esophageal stricture, Fibromyalgia, GERD (gastroesophageal reflux disease), Hypertension, Lumbar stenosis, Osteoporosis, and  Sleep apnea. Family: family history includes Cancer in her father, mother, and sister; Cerebral aneurysm in her father; Depression in her sister; Diabetes in her father.  Past Surgical History:  Procedure Laterality Date  . ABDOMINAL HYSTERECTOMY    . APPENDECTOMY    . knee replacements    . ROTATOR CUFF REPAIR     Active Ambulatory Problems    Diagnosis Date Noted  . B12 deficiency 12/23/2013  . Benign essential HTN 12/23/2013  . DDD (degenerative disc disease), cervical 04/14/2014  . Type 2 diabetes mellitus ([place]) 12/23/2013  . Cephalalgia 07/24/2015  . Neuritis or radiculitis due to rupture of lumbar intervertebral disc 01/05/2015  . Lumbar canal stenosis 09/18/2013  . OSA (obstructive sleep apnea) 12/23/2013  . DM type 2 with diabetic mixed hyperlipidemia ([place]) 12/04/2018  . Hyperlipidemia, mixed 10/28/2015  . Low serum vitamin D 05/29/2018  . Nocturnal hypoxia 05/04/2016  . Lumbar central spinal stenosis w/ neurogenic claudication 09/18/2013  . Chronic pain syndrome 09/02/2019  . Pharmacologic therapy 09/02/2019  . Disorder of skeletal system 09/02/2019  . Problems influencing health status 09/02/2019  . Abnormal MRI, lumbar spine (01/13/2019) 09/02/2019  . Abnormal MRI, cervical spine (2014) 09/02/2019  . Chronic hip pain (  2ry area of Pain) (Bilateral) (R>L) 09/03/2019  . Chronic low back pain (1ry area of Pain) (Bilateral) (R>L) w/o sciatica 09/03/2019  . Chronic shoulder pain (3ry area of Pain) (Bilateral) (L>R) 09/03/2019  . Chronic lower extremity pain (4th area of Pain) (Bilateral) (R>L) 09/03/2019  . Diabetic peripheral neuropathy ([place]) 09/03/2019  . Neurogenic pain 09/03/2019  . Lumbar facet syndrome (Bilateral) (R>L) 09/03/2019  . Lumbar facet hypertrophy 09/03/2019  . DDD (degenerative disc disease), lumbosacral 09/03/2019  . Grade 1 Anterolisthesis of L3/L4 09/03/2019  . Localized osteoarthritis of shoulder regions, bilateral 01/20/2020  . History of rotator  cuff syndrome (bilateral) 01/20/2020   Resolved Ambulatory Problems    Diagnosis Date Noted  . No Resolved Ambulatory Problems   Past Medical History:  Diagnosis Date  . Arthritis   . Chronic fatigue   . DM2 (diabetes mellitus, type 2) ([place])   . Esophageal stricture   . Fibromyalgia   . GERD (gastroesophageal reflux disease)   . Hypertension   . Lumbar stenosis   . Osteoporosis   . Sleep apnea    Constitutional Exam  General appearance: cooperative, slowed mentation, disoriented, confused  and in mild distress Vitals:   01/20/20 0813  BP: 125/88  Pulse: 82  Resp: 16  Temp: 98.5 F (36.9 C)  TempSrc: Oral  SpO2: 99%  Weight: 151 lb (68.5 kg)  Height: $Remove'5\' 4"'SlTFKZN$  (1.626 m)   BMI Assessment: Estimated body mass index is 25.92 kg/m as calculated from the following:   Height as of this encounter: $RemoveBeforeD'5\' 4"'[id number]$  (1.626 m).   Weight as of this encounter: 151 lb (68.5 kg).  BMI interpretation table: BMI level Category Range association with higher incidence of chronic pain  <18 kg/m2 Underweight   18.5-24.9 kg/m2 Ideal body weight   25-29.9 kg/m2 Overweight Increased incidence by 20%  30-34.9 kg/m2 Obese (Class I) Increased incidence by 68%  35-39.9 kg/m2 Severe obesity (Class II) Increased incidence by 136%  >40 kg/m2 Extreme obesity (Class III) Increased incidence by 254%   Patient's current BMI Ideal Body weight  Body mass index is 25.92 kg/m. Ideal body weight: 54.7 kg (120 lb 9.5 oz) Adjusted ideal body weight: 60.2 kg (132 lb 12.1 oz)   BMI Readings from Last 4 Encounters:  01/20/20 25.92 kg/m  09/03/19 27.44 kg/m  05/05/18 27.13 kg/m  11/25/16 27.12 kg/m   Wt Readings from Last 4 Encounters:  01/20/20 151 lb (68.5 kg)  09/03/19 150 lb (68 kg)  05/05/18 158 lb 1.1 oz (71.7 kg)  11/25/16 158 lb (71.7 kg)    Eyes: PERLA Respiratory: No evidence of acute respiratory distress  Cervical Spine Exam  Skin & Axial Inspection: No masses, redness, edema, swelling, or  associated skin lesions Alignment: Symmetrical Functional ROM: Pain restricted ROM      Stability: No instability detected Muscle Tone/Strength: Functionally intact. No obvious neuro-muscular anomalies detected. Sensory (Neurological): Dermatomal pain pattern Palpation: Complains of area being tender to palpation              Upper Extremity (UE) Exam    Side: Right upper extremity  Side: Left upper extremity  Skin & Extremity Inspection: Skin color, temperature, and hair growth are WNL. No peripheral edema or cyanosis. No masses, redness, swelling, asymmetry, or associated skin lesions. No contractures.  Skin & Extremity Inspection: Skin color, temperature, and hair growth are WNL. No peripheral edema or cyanosis. No masses, redness, swelling, asymmetry, or associated skin lesions. No contractures.  Functional ROM: Pain restricted ROM for  shoulder  Functional ROM: Pain restricted ROM for shoulder  Muscle Tone/Strength: Functionally intact. No obvious neuro-muscular anomalies detected.   Muscle Tone/Strength: Functionally intact. No obvious neuro-muscular anomalies detected.  Sensory (Neurological): Arthropathic arthralgia          Sensory (Neurological): Articular pain pattern          Palpation: No palpable anomalies              Palpation: No palpable anomalies              Provocative Test(s):  Phalen's test: deferred Tinel's test: deferred Apley's scratch test (touch opposite shoulder):  Action 1 (Across chest): Decreased ROM Action 2 (Overhead): Decreased ROM Action 3 (LB reach):Decreased ROM   Provocative Test(s):  Phalen's test: deferred Tinel's test: deferred Apley's scratch test (touch opposite shoulder):  Action 1 (Across chest): Decreased ROM Action 2 (Overhead):Decreased ROM Action 3 (LB reach): Decreased ROM     Lumbar Exam  Skin & Axial Inspection: No masses, redness, or swelling Alignment: Symmetrical Functional ROM: Pain restricted ROM affecting both  sides Stability: No instability detected Muscle Tone/Strength: Functionally intact. No obvious neuro-muscular anomalies detected. Sensory (Neurological): Musculoskeletal pain pattern Provocative Tests: Hyperextension/rotation test: (+) bilaterally for facet joint pain. Lumbar quadrant test (Kemp's test): (+) bilaterally for facet joint pain. Lateral bending test: deferred today       Patrick's Maneuver: deferred today                   FABER* test: deferred today                   S-I anterior distraction/compression test: deferred today         S-I lateral compression test: deferred today         S-I Thigh-thrust test: deferred today         S-I Gaenslen's test: deferred today         *(Flexion, ABduction and External Rotation)  Gait & Posture Assessment  Ambulation: Patient came in today in a wheel chair Gait: Significantly limited. Dependent on assistive device to ambulate Posture: Difficulty standing up straight, due to pain   Lower Extremity Exam    Side: Right lower extremity  Side: Left lower extremity  Stability: No instability observed          Stability: No instability observed          Skin & Extremity Inspection: Skin color, temperature, and hair growth are WNL. No peripheral edema or cyanosis. No masses, redness, swelling, asymmetry, or associated skin lesions. No contractures.  Skin & Extremity Inspection: Skin color, temperature, and hair growth are WNL. No peripheral edema or cyanosis. No masses, redness, swelling, asymmetry, or associated skin lesions. No contractures.  Functional ROM: Pain restricted ROM for hip and knee joints          Functional ROM: Unrestricted ROM                  Muscle Tone/Strength: Functionally intact. No obvious neuro-muscular anomalies detected.  Muscle Tone/Strength: Functionally intact. No obvious neuro-muscular anomalies detected.  Sensory (Neurological): Arthropathic arthralgia      RIGHT HIP  Sensory (Neurological): Unimpaired         DTR: Patellar: deferred today Achilles: deferred today Plantar: deferred today  DTR: Patellar: deferred today Achilles: deferred today Plantar: deferred today  Palpation: No palpable anomalies  Palpation: No palpable anomalies   Assessment  Primary Diagnosis & Pertinent Problem List: The primary encounter diagnosis  was Chronic shoulder pain (3ry area of Pain) (Bilateral) (L>R). Diagnoses of Localized osteoarthritis of shoulder regions, bilateral, History of rotator cuff syndrome (bilateral), Chronic pain syndrome, Chronic hip pain (2ry area of Pain) (Bilateral) (R>L), Chronic low back pain (1ry area of Pain) (Bilateral) (R>L) w/o sciatica, Chronic lower extremity pain (4th area of Pain) (Bilateral) (R>L), Lumbar facet hypertrophy, DDD (degenerative disc disease), lumbosacral, and Abnormal MRI, lumbar spine (01/13/2019) were also pertinent to this visit.  Visit Diagnosis (New problems to examiner): 1. Chronic shoulder pain (3ry area of Pain) (Bilateral) (L>R)   2. Localized osteoarthritis of shoulder regions, bilateral   3. History of rotator cuff syndrome (bilateral)   4. Chronic pain syndrome   5. Chronic hip pain (2ry area of Pain) (Bilateral) (R>L)   6. Chronic low back pain (1ry area of Pain) (Bilateral) (R>L) w/o sciatica   7. Chronic lower extremity pain (4th area of Pain) (Bilateral) (R>L)   8. Lumbar facet hypertrophy   9. DDD (degenerative disc disease), lumbosacral   10. Abnormal MRI, lumbar spine (01/13/2019)    Plan of Care (Initial workup plan)   1.  Bilateral shoulder osteoarthritis, rotator cuff dysfunction: History of rotator cuff surgery approximately 10 years ago with worsening pain, left greater than right.  She has tried various joint and bursa injections for her shoulder including glenohumeral joint and acromioclavicular joint without any significant long-term benefit.  She is being referred here to consider suprascapular nerve block as well as any other peripheral  nerve stimulation procedures.  I had an extensive discussion with the patient as well as her daughter about therapeutic options.  She does have pain along the posterior aspect of her shoulder in the suprascapular region that is worse with abduction.  We discussed diagnostic bilateral suprascapular nerve block and if effective, can consider pulsed radiofrequency ablation of the suprascapular nerve bilaterally.  I did discuss sprint peripheral nerve stimulation with the patient and given her history of dementia, I do not think she would be a good candidate for something that is an indwelling technology/catheter for 60 days that requires a remote to operate.  Furthermore, I feel the patient will have a difficult time understanding stimulation parameters on the remote and how they relate to the contractions that she feels in her shoulder if we pursued Sprint axillary nerve stimulation.  Of note patient is on Eliquis for atrial fibrillation and I have instructed her to continue this medication prior to her suprascapular nerve block as risks of stopping it are high.  She states that she continued her Eliquis for her lumbar epidural steroid injections that were done with physical medicine and rehab.  She did not have any issues.  I informed the patient that we will monitor her for a little bit longer after her procedure to ensure that there is no hematoma.  2.  Right hip pain: Related to right hip degenerative disease, right hip osteoarthritis and also component of lumbar radiculopathy.  Patient's pain is worse with weightbearing.  Discussed right intra-articular hip steroid injection.  Can do this after bilateral suprascapular nerve block.  3.  Chronic pain syndrome related to above pain condition/diagnoses.  Patient unable to tolerate NSAIDs given kidney dysfunction.  Is utilizing acetaminophen and hydrocodone for breakthrough pain.  Avoid gabapentin, Lyrica given risk of sedation and confusion as the patient has  baseline dementia.  Could consider low-dose buprenorphine patch if above interventional therapies were not helpful.  Discussed how this would be a transdermal patch that would provide  the patient with medication every hour.  Patient as well as her daughter were counseled that any sort of chronic opioid therapy increases the patient's risk of dementia, cognitive dysfunction, confusion and that we need to consider risk-benefit if we are to change her medications.   Interventional management options: Loretta Webb was informed that there is no guarantee that she would be a candidate for interventional therapies. The decision will be based on the results of diagnostic studies, as well as Loretta Webb's risk profile.  Procedure(s) under consideration:  Bilateral suprascapular nerve block Suprascapular pulsed radiofrequency ablation Right hip intra-articular steroid injection Right sacroiliac joint injection Lumbar facet blocks.   Provider-requested follow-up: Return in about 2 days (around 01/22/2020) for B/L Suprascpular NB without sedation.  Future Appointments  Date Time Provider [place]  01/22/2020  8:00 AM Gillis Santa, MD [facility] None    Note by: Gillis Santa, MD Date: 01/20/2020; Time: 12:37 PM

## 2020-01-20 NOTE — Patient Instructions (Signed)

## 2020-01-20 NOTE — Progress Notes (Signed)
Safety precautions to be maintained throughout the outpatient stay will include: orient to surroundings, keep bed in low position, maintain call bell within reach at all times, provide assistance with transfer out of bed and ambulation.  

## 2020-01-22 ENCOUNTER — Other Ambulatory Visit: Payer: Self-pay

## 2020-01-22 ENCOUNTER — Encounter: Payer: Self-pay | Admitting: Student in an Organized Health Care Education/Training Program

## 2020-01-22 ENCOUNTER — Ambulatory Visit
Admission: RE | Admit: 2020-01-22 | Discharge: 2020-01-22 | Disposition: A | Discharge status: Home / Self Care | Payer: Medicare Other | Source: Ambulatory Visit | Attending: Student in an Organized Health Care Education/Training Program | Admitting: Student in an Organized Health Care Education/Training Program

## 2020-01-22 ENCOUNTER — Ambulatory Visit (HOSPITAL_BASED_OUTPATIENT_CLINIC_OR_DEPARTMENT_OTHER): Payer: Medicare Other | Admitting: Student in an Organized Health Care Education/Training Program

## 2020-01-22 VITALS — BP 133/106 | HR 99 | Temp 98.9°F | Resp 25 | Ht 63.0 in | Wt 145.0 lb

## 2020-01-22 DIAGNOSIS — M25512 Pain in left shoulder: Secondary | ICD-10-CM

## 2020-01-22 DIAGNOSIS — M25511 Pain in right shoulder: Secondary | ICD-10-CM | POA: Diagnosis not present

## 2020-01-22 DIAGNOSIS — M19011 Primary osteoarthritis, right shoulder: Secondary | ICD-10-CM | POA: Insufficient documentation

## 2020-01-22 DIAGNOSIS — G894 Chronic pain syndrome: Secondary | ICD-10-CM | POA: Diagnosis present

## 2020-01-22 DIAGNOSIS — Z8739 Personal history of other diseases of the musculoskeletal system and connective tissue: Secondary | ICD-10-CM

## 2020-01-22 DIAGNOSIS — M19012 Primary osteoarthritis, left shoulder: Secondary | ICD-10-CM | POA: Diagnosis present

## 2020-01-22 DIAGNOSIS — G8929 Other chronic pain: Secondary | ICD-10-CM

## 2020-01-22 MED ORDER — IOHEXOL 180 MG/ML  SOLN: 5.0000 mL | Freq: Once | INTRAMUSCULAR | Status: DC

## 2020-01-22 MED ORDER — ROPIVACAINE HCL 2 MG/ML IJ SOLN
9.0000 mL | Freq: Once | INTRAMUSCULAR | Status: AC
  Administered 2020-01-22: 9 mL via PERINEURAL

## 2020-01-22 MED ORDER — DEXAMETHASONE SODIUM PHOSPHATE 10 MG/ML IJ SOLN
10.0000 mg | Freq: Once | INTRAMUSCULAR | Status: AC
  Administered 2020-01-22: 10 mg

## 2020-01-22 MED ORDER — DEXAMETHASONE SODIUM PHOSPHATE 10 MG/ML IJ SOLN
INTRAMUSCULAR | Status: AC
  Filled 2020-01-22: qty 1

## 2020-01-22 MED ORDER — METHYLPREDNISOLONE ACETATE 40 MG/ML IJ SUSP
INTRAMUSCULAR | Status: AC
  Filled 2020-01-22: qty 1

## 2020-01-22 MED ORDER — LIDOCAINE HCL 2 % IJ SOLN
20.0000 mL | Freq: Once | INTRAMUSCULAR | Status: AC
  Administered 2020-01-22: 400 mg

## 2020-01-22 MED ORDER — LIDOCAINE HCL 2 % IJ SOLN
INTRAMUSCULAR | Status: AC
  Filled 2020-01-22: qty 20

## 2020-01-22 MED ORDER — ROPIVACAINE HCL 2 MG/ML IJ SOLN
INTRAMUSCULAR | Status: AC
  Filled 2020-01-22: qty 10

## 2020-01-22 NOTE — Progress Notes (Signed)
Safety precautions to be maintained throughout the outpatient stay will include: orient to surroundings, keep bed in low position, maintain call bell within reach at all times, provide assistance with transfer out of bed and ambulation.  

## 2020-01-22 NOTE — Patient Instructions (Signed)

## 2020-01-22 NOTE — Progress Notes (Signed)
PROVIDER NOTE: Information contained herein reflects review and annotations entered in association with encounter. Interpretation of such information and data should be left to medically-trained personnel. Information provided to patient can be located elsewhere in the medical record under "Patient Instructions". Document created using STT-dictation technology, any transcriptional errors that may result from process are unintentional.    Patient: Loretta Webb  Service Category: Procedure  Provider: Edward JollyBilal Starsky Nanna, MD  DOB: 14-Jan-1932  DOS: 01/22/2020  Location: [facility]  MRN: [id number]  Setting: Ambulatory - outpatient  Referring Provider: Danella PentonMiller, Mark F, MD  Type: Established Patient  Specialty: Interventional Pain Management  PCP: Danella PentonMiller, Mark F, MD   Primary Reason for Visit: Interventional Pain Management Treatment. CC: Shoulder Pain (bilat, right is worse)  Procedure:          Anesthesia, Analgesia, Anxiolysis:  Type: Diagnostic Suprascapular nerve Block #1  Primary Purpose: Diagnostic Region: Posterior Shoulder & Scapular Areas Level: Superior to the scapular spine, in the lateral aspect of the supraspinatus fossa (Suprascapular notch). Target Area: Suprascapular nerve as it passes thru the lower portion of the suprascapular notch. Approach: Posterior percutaneous approach. Laterality: Bilateral  Type: Local Anesthesia  Local Anesthetic: Lidocaine 1-2%  Position: Prone   Indications: 1. Chronic shoulder pain (3ry area of Pain) (Bilateral) (L>R)   2. Localized osteoarthritis of shoulder regions, bilateral   3. History of rotator cuff syndrome (bilateral)   4. Chronic pain syndrome    Pain Score: Pre-procedure: 10-Worst pain ever/10 Post-procedure: 1  (able to move shoulders well)/10   Pre-op Assessment:  Loretta Webb is a 86 y.o. (year old), female patient, seen today for interventional treatment. She  has a past surgical history that includes knee  replacements; Abdominal hysterectomy; Rotator cuff repair; and Appendectomy. Loretta Webb has a current medication list which includes the following prescription(s): acetaminophen, alprazolam, apixaban, bumetanide, cholecalciferol, vitamin b-12, diclofenac sodium, digoxin, diltiazem, donepezil, duloxetine, hydrocodone-acetaminophen, metformin, pantoprazole, pravastatin, trazodone, losartan, and propranolol, and the following Facility-Administered Medications: iohexol. Her primarily concern today is the Shoulder Pain (bilat, right is worse)  Initial Vital Signs:  Pulse/HCG Rate: 97  Temp: 98.9 F (37.2 C) Resp: 16 BP: (!) 132/94 SpO2: 100 %  BMI: Estimated body mass index is 25.69 kg/m as calculated from the following:   Height as of this encounter: 5\' 3"  (1.6 m).   Weight as of this encounter: 145 lb (65.8 kg).  Risk Assessment: Allergies: Reviewed. She has No Known Allergies.  Allergy Precautions: None required Coagulopathies: Reviewed. None identified.  Blood-thinner therapy: None at this time Active Infection(s): Reviewed. None identified. Loretta Webb is afebrile  Site Confirmation: Loretta Webb was asked to confirm the procedure and laterality before marking the site Procedure checklist: Completed Consent: Before the procedure and under the influence of no sedative(s), amnesic(s), or anxiolytics, the patient was informed of the treatment options, risks and possible complications. To fulfill our ethical and legal obligations, as recommended by the American Medical Association's Code of Ethics, I have informed the patient of my clinical impression; the nature and purpose of the treatment or procedure; the risks, benefits, and possible complications of the intervention; the alternatives, including doing nothing; the risk(s) and benefit(s) of the alternative treatment(s) or procedure(s); and the risk(s) and benefit(s) of doing nothing. The patient was provided information about the general risks  and possible complications associated with the procedure. These may include, but are not limited to: failure to achieve desired goals, infection, bleeding, organ or nerve damage, allergic reactions, paralysis,  and death. In addition, the patient was informed of those risks and complications associated to the procedure, such as failure to decrease pain; infection; bleeding; organ or nerve damage with subsequent damage to sensory, motor, and/or autonomic systems, resulting in permanent pain, numbness, and/or weakness of one or several areas of the body; allergic reactions; (i.e.: anaphylactic reaction); and/or death. Furthermore, the patient was informed of those risks and complications associated with the medications. These include, but are not limited to: allergic reactions (i.e.: anaphylactic or anaphylactoid reaction(s)); adrenal axis suppression; blood sugar elevation that in diabetics may result in ketoacidosis or comma; water retention that in patients with history of congestive heart failure may result in shortness of breath, pulmonary edema, and decompensation with resultant heart failure; weight gain; swelling or edema; medication-induced neural toxicity; particulate matter embolism and blood vessel occlusion with resultant organ, and/or nervous system infarction; and/or aseptic necrosis of one or more joints. Finally, the patient was informed that Medicine is not an exact science; therefore, there is also the possibility of unforeseen or unpredictable risks and/or possible complications that may result in a catastrophic outcome. The patient indicated having understood very clearly. We have given the patient no guarantees and we have made no promises. Enough time was given to the patient to ask questions, all of which were answered to the patient's satisfaction. Ms. Perret has indicated that she wanted to continue with the procedure. Attestation: I, the ordering provider, attest that I have discussed  with the patient the benefits, risks, side-effects, alternatives, likelihood of achieving goals, and potential problems during recovery for the procedure that I have provided informed consent. Date  Time: 01/22/2020  7:53 AM  Pre-Procedure Preparation:  Monitoring: As per clinic protocol. Respiration, ETCO2, SpO2, BP, heart rate and rhythm monitor placed and checked for adequate function Safety Precautions: Patient was assessed for positional comfort and pressure points before starting the procedure. Time-out: I initiated and conducted the "Time-out" before starting the procedure, as per protocol. The patient was asked to participate by confirming the accuracy of the "Time Out" information. Verification of the correct person, site, and procedure were performed and confirmed by me, the nursing staff, and the patient. "Time-out" conducted as per Joint Commission's Universal Protocol (UP.01.01.01). Time: 0902  Description of Procedure:          Area Prepped: Entire shoulder Area DuraPrep (Iodine Povacrylex [0.7% available iodine] and Isopropyl Alcohol, 74% w/w) Safety Precautions: Aspiration looking for blood return was conducted prior to all injections. At no point did we inject any substances, as a needle was being advanced. No attempts were made at seeking any paresthesias. Safe injection practices and needle disposal techniques used. Medications properly checked for expiration dates. SDV (single dose vial) medications used. Description of the Procedure: Protocol guidelines were followed. The patient was placed in position over the procedure table. The target area was identified and the area prepped in the usual manner. Skin & deeper tissues infiltrated with local anesthetic. Appropriate amount of time allowed to pass for local anesthetics to take effect. The procedure needles were then advanced to the target area. Proper needle placement secured. Negative aspiration confirmed. Solution injected in  intermittent fashion, asking for systemic symptoms every 0.5cc of injectate. The needles were then removed and the area cleansed, making sure to leave some of the prepping solution back to take advantage of its long term bactericidal properties.  Vitals:   01/22/20 0850 01/22/20 0900 01/22/20 0910 01/22/20 0918  BP: (!) 153/97 (!) 145/91 (!) 135/97 Marland Kitchen)  133/106  Pulse: (!) 108 (!) 109 99 99  Resp: (!) 21 20 (!) 25 (!) 25  Temp:      TempSrc:      SpO2: 97% 98% 99% 99%  Weight:      Height:        Start Time: 0902 hrs. End Time: 0915 hrs. Materials:  Needle(s) Type: Spinal Needle Gauge: 25G Length: 3.5-in Medication(s): Please see orders for medications and dosing details. 4 cc solution made of 3 cc of 0.2% ropivacaine, 1 cc of Decadron 10 mg/cc.  Injected for the left suprascapular nerve 4 cc solution made of 3 cc of 0.2% ropivacaine, 1 cc of Decadron 10 mg/cc.  Injected for the right suprascapular nerve  Imaging Guidance (Non-Spinal):          Type of Imaging Technique: Fluoroscopy Guidance (Non-Spinal) and ultrasound guidance to visualize suprascapular notch and suprascapular artery Indication(s): Assistance in needle guidance and placement for procedures requiring needle placement in or near specific anatomical locations not easily accessible without such assistance. Exposure Time: Please see nurses notes. Contrast: Before injecting any contrast, we confirmed that the patient did not have an allergy to iodine, shellfish, or radiological contrast. Once satisfactory needle placement was completed at the desired level, radiological contrast was injected. Contrast injected under live fluoroscopy. No contrast complications. See chart for type and volume of contrast used. Fluoroscopic Guidance: I was personally present during the use of fluoroscopy. "Tunnel Vision Technique" used to obtain the best possible view of the target area. Parallax error corrected before commencing the procedure.  "Direction-depth-direction" technique used to introduce the needle under continuous pulsed fluoroscopy. Once target was reached, antero-posterior, oblique, and lateral fluoroscopic projection used confirm needle placement in all planes. Images permanently stored in EMR. Interpretation: I personally interpreted the imaging intraoperatively. Adequate needle placement confirmed in multiple planes. Appropriate spread of contrast into desired area was observed. No evidence of afferent or efferent intravascular uptake. Permanent images saved into the patient's record.  Antibiotic Prophylaxis:   Anti-infectives (From admission, onward)   None     Indication(s): None identified  Post-operative Assessment:  Post-procedure Vital Signs:  Pulse/HCG Rate: 99  Temp: 98.9 F (37.2 C) Resp: (!) 25 BP: (!) 133/106 SpO2: 99 %  EBL: None  Complications: No immediate post-treatment complications observed by team, or reported by patient.  Note: The patient tolerated the entire procedure well. A repeat set of vitals were taken after the procedure and the patient was kept under observation following institutional policy, for this type of procedure. Post-procedural neurological assessment was performed, showing return to baseline, prior to discharge. The patient was provided with post-procedure discharge instructions, including a section on how to identify potential problems. Should any problems arise concerning this procedure, the patient was given instructions to immediately contact us, at any time, without hesitation. In any case, we plan to contact the patient by telephone for a follow-up status report regarding this interventional procedure.  Comments:  No additional relevant information.  Plan of Care  Orders:  Orders Placed This Encounter  Procedures  . DG PAIN CLINIC C-ARM 1-60 MIN NO REPORT    Intraoperative interpretation by procedural physician at [facility].    Standing Status:    Standing    Number of Occurrences:   1    Order Specific Question:   Reason for exam:    Answer:   Assistance in needle guidance and placement for procedures requiring needle placement in or near specific anatomical locations not  easily accessible without such assistance.    Medications ordered for procedure: Meds ordered this encounter  Medications  . lidocaine (XYLOCAINE) 2 % (with pres) injection 400 mg  . dexamethasone (DECADRON) injection 10 mg  . dexamethasone (DECADRON) injection 10 mg  . ropivacaine (PF) 2 mg/mL (0.2%) (NAROPIN) injection 9 mL  . iohexol (OMNIPAQUE) 180 MG/ML injection 5 mL    Must be Myelogram-compatible. If not available, you may substitute with a water-soluble, non-ionic, hypoallergenic, myelogram-compatible radiological contrast medium.   Medications administered: We administered lidocaine, dexamethasone, dexamethasone, and ropivacaine (PF) 2 mg/mL (0.2%).  See the medical record for exact dosing, route, and time of administration.  Follow-up plan:   Return in about 4 weeks (around 02/19/2020) for Post Procedure Evaluation, virtual.      Status post bilateral suprascapular nerve block 01/22/2020.  Consider pulsed RFA.   Recent Visits Date Type Provider Dept  01/20/20 Office Visit Edward Jolly, MD [facility]  Showing recent visits within past 90 days and meeting all other requirements Today's Visits Date Type Provider Dept  01/22/20 Procedure visit Edward Jolly, MD [facility]  Showing today's visits and meeting all other requirements Future Appointments Date Type Provider Dept  02/24/20 Appointment Edward Jolly, MD [facility]  Showing future appointments within next 90 days and meeting all other requirements  Disposition: Discharge home  Discharge (Date  Time): 01/22/2020;   hrs.   Primary Care Physician: [name], MD Location: [facility] Note by: Edward Jolly, MD Date:  01/22/2020; Time: 10:28 AM  Disclaimer:  Medicine is not an exact science. The only guarantee in medicine is that nothing is guaranteed. It is important to note that the decision to proceed with this intervention was based on the information collected from the patient. The Data and conclusions were drawn from the patient's questionnaire, the interview, and the physical examination. Because the information was provided in large part by the patient, it cannot be guaranteed that it has not been purposely or unconsciously manipulated. Every effort has been made to obtain as much relevant data as possible for this evaluation. It is important to note that the conclusions that lead to this procedure are derived in large part from the available data. Always take into account that the treatment will also be dependent on availability of resources and existing treatment guidelines, considered by other Pain Management Practitioners as being common knowledge and practice, at the time of the intervention. For Medico-Legal purposes, it is also important to point out that variation in procedural techniques and pharmacological choices are the acceptable norm. The indications, contraindications, technique, and results of the above procedure should only be interpreted and judged by a Board-Certified Interventional Pain Specialist with extensive familiarity and expertise in the same exact procedure and technique.

## 2020-02-03 ENCOUNTER — Ambulatory Visit: Payer: Medicare Other | Admitting: Student in an Organized Health Care Education/Training Program

## 2020-02-05 ENCOUNTER — Telehealth: Payer: Self-pay | Admitting: *Deleted

## 2020-02-05 ENCOUNTER — Telehealth: Payer: Self-pay | Admitting: Student in an Organized Health Care Education/Training Program
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
## 2020-02-05 NOTE — Telephone Encounter (Signed)
Called Debbie back to let her know that I have spoken with Dr Cherylann Ratel and he feels that we should monitor this and see if it improves.  Eunice Blase states that home PT comes in twice per week and she will have them assess the left bicep tomorrow.   If they feel that it needs attention she will let us know or contact PCP.

## 2020-02-05 NOTE — Telephone Encounter (Signed)
Daughter Eunice Blase has some questions about some muscle soreness that is not going away and also about the pain diary. Please call [phone]

## 2020-02-20 NOTE — Progress Notes (Signed)
Called and left message to call about mondays appt.

## 2020-02-21 ENCOUNTER — Telehealth: Payer: Self-pay | Admitting: *Deleted

## 2020-02-21 ENCOUNTER — Encounter: Payer: Self-pay | Admitting: Student in an Organized Health Care Education/Training Program

## 2020-02-21 NOTE — Telephone Encounter (Signed)
Called and talked with daughter. Chart updated for mondays [facility] appt.

## 2020-02-24 ENCOUNTER — Encounter: Payer: Self-pay | Admitting: Student in an Organized Health Care Education/Training Program

## 2020-02-24 ENCOUNTER — Other Ambulatory Visit: Payer: Self-pay

## 2020-02-24 ENCOUNTER — Ambulatory Visit
Payer: Medicare Other | Attending: Student in an Organized Health Care Education/Training Program | Admitting: Student in an Organized Health Care Education/Training Program

## 2020-02-24 DIAGNOSIS — G894 Chronic pain syndrome: Secondary | ICD-10-CM | POA: Diagnosis not present

## 2020-02-24 DIAGNOSIS — M1611 Unilateral primary osteoarthritis, right hip: Secondary | ICD-10-CM | POA: Diagnosis not present

## 2020-02-24 DIAGNOSIS — M19012 Primary osteoarthritis, left shoulder: Secondary | ICD-10-CM

## 2020-02-24 DIAGNOSIS — Z8739 Personal history of other diseases of the musculoskeletal system and connective tissue: Secondary | ICD-10-CM

## 2020-02-24 DIAGNOSIS — M25551 Pain in right hip: Secondary | ICD-10-CM

## 2020-02-24 DIAGNOSIS — M25511 Pain in right shoulder: Secondary | ICD-10-CM

## 2020-02-24 DIAGNOSIS — M19011 Primary osteoarthritis, right shoulder: Secondary | ICD-10-CM

## 2020-02-24 DIAGNOSIS — M1612 Unilateral primary osteoarthritis, left hip: Secondary | ICD-10-CM

## 2020-02-24 DIAGNOSIS — M25512 Pain in left shoulder: Secondary | ICD-10-CM

## 2020-02-24 DIAGNOSIS — G8929 Other chronic pain: Secondary | ICD-10-CM

## 2020-02-24 NOTE — Progress Notes (Signed)
Patient: Loretta Webb  Service Category: E/M  Provider: Gillis Santa, MD  DOB: 02-21-1932  DOS: 02/24/2020  Location: Office  MRN: [id number]  Setting: Ambulatory outpatient  Referring Provider: Rusty Aus, MD  Type: Established Patient  Specialty: Interventional Pain Management  PCP: Rusty Aus, MD  Location: Home  Delivery: TeleHealth     Virtual Encounter - Pain Management PROVIDER NOTE: Information contained herein reflects review and annotations entered in association with encounter. Interpretation of such information and data should be left to medically-trained personnel. Information provided to patient can be located elsewhere in the medical record under "Patient Instructions". Document created using STT-dictation technology, any transcriptional errors that may result from process are unintentional.    Contact & Pharmacy Preferred: [phone] Home: [phone] (home) Mobile: There is no such number on file (mobile). E-mail: [email or web address]  [id number] [place]Phone: [phone]: [phone] [place] [place]Phone: [phone]: [phone]  Pre-screening  Ms. Abdalla offered "in-person" vs "virtual" encounter. She indicated preferring virtual for this encounter.   Reason COVID-19*  Social distancing based on CDC and AMA recommendations.   I contacted RPaw[place]on 02/24/2020 via video conference.      I clearly identified myself as BGillis Santa MD. I verified that I was speaking with the correct person using two identifiers (Name: RGENEVIENE TESCH and date of birth: 7Jun 22, 1933.  Consent I sought verbal advanced consent from RJessica Priestfor virtual visit interactions. I informed Ms. Hollister of possible security and privacy concerns, risks, and limitations associated with providing  "not-in-person" medical evaluation and management services. I also informed Ms. Witt of the availability of "in-person" appointments. Finally, I informed her that there would be a charge for the virtual visit and that she could be  personally, fully or partially, financially responsible for it. Ms. HStinerexpressed understanding and agreed to proceed.   Historic Elements   Ms. RKIMM SIDERis a 86y.o. year old, female patient evaluated today after our last contact on 02/05/2020. Ms. HAng has a past medical history of Arthritis, Chronic fatigue, DM2 (diabetes mellitus, type 2) ([place], Esophageal stricture, Fibromyalgia, GERD (gastroesophageal reflux disease), Hypertension, Lumbar stenosis, Osteoporosis, and Sleep apnea. She also  has a past surgical history that includes knee replacements; Abdominal hysterectomy; Rotator cuff repair; and Appendectomy. Ms. HHallstromhas a current medication list which includes the following prescription(s): acetaminophen, alprazolam, aspirin, bumetanide, cholecalciferol, vitamin b-12, diclofenac sodium, diltiazem, donepezil, duloxetine, hydrocodone-acetaminophen, losartan, metformin, pantoprazole, propranolol, trazodone, apixaban, digoxin, and pravastatin. She  reports that she has never smoked. She has never used smokeless tobacco. She reports that she does not drink alcohol and does not use drugs. Ms. HWandlerhas No Known Allergies.   HPI  Today, she is being contacted for a post-procedure assessment.   Post-Procedure Evaluation  Procedure (02/05/2020):   Type: Diagnostic Suprascapular nerve Block #1  Primary Purpose: Diagnostic Region: Posterior Shoulder & Scapular Areas Level: Superior to the scapular spine, in the lateral aspect of the supraspinatus fossa (Suprascapular notch). Target Area: Suprascapular nerve as it passes thru the lower portion of the suprascapular notch. Approach: Posterior percutaneous approach. Laterality: Bilateral  Sedation: Please see  nurses note.  Effectiveness during initial hour after procedure(Ultra-Short Term Relief): 100 %   Local anesthetic used: Long-acting (4-6 hours) Effectiveness: Defined as  any analgesic benefit obtained secondary to the administration of local anesthetics. This carries significant diagnostic value as to the etiological location, or anatomical origin, of the pain. Duration of benefit is expected to coincide with the duration of the local anesthetic used.  Effectiveness during initial 4-6 hours after procedure(Short-Term Relief): 100 %   Long-term benefit: Defined as any relief past the pharmacologic duration of the local anesthetics.  Effectiveness past the initial 6 hours after procedure(Long-Term Relief): 100 %   Current benefits: Defined as benefit that persist at this time.   Analgesia:  >75% relief Function: Ms. Buerger reports improvement in function ROM: Ms. Batres reports improvement in ROM    Laboratory Chemistry Profile   Renal Lab Results  Component Value Date   BUN 14 09/03/2019   CREATININE 1.44 (H) 09/03/2019   BCR 10 (L) 09/03/2019   GFRAA 38 (L) 09/03/2019   GFRNONAA 33 (L) 09/03/2019     Hepatic Lab Results  Component Value Date   AST 12 09/03/2019   ALT 10 (L) 11/25/2016   ALBUMIN 4.3 09/03/2019   ALKPHOS 50 09/03/2019   LIPASE 42 09/11/2015     Electrolytes Lab Results  Component Value Date   NA 140 09/03/2019   K 4.1 09/03/2019   CL 104 09/03/2019   CALCIUM 9.8 09/03/2019   MG 2.0 09/03/2019     Bone Lab Results  Component Value Date   25OHVITD1 39 09/03/2019   25OHVITD2 3.4 09/03/2019   25OHVITD3 36 09/03/2019     Inflammation (CRP: Acute Phase) (ESR: Chronic Phase) Lab Results  Component Value Date   CRP <1 09/03/2019   ESRSEDRATE 17 09/03/2019       Note: Above Lab results reviewed.   Assessment  The primary encounter diagnosis was Primary osteoarthritis of right hip. Diagnoses of Primary osteoarthritis of left hip, Right hip pain,  Chronic pain syndrome, Chronic shoulder pain (3ry area of Pain) (Bilateral) (L>R), Localized osteoarthritis of shoulder regions, bilateral, and History of rotator cuff syndrome (bilateral) were also pertinent to this visit.  Plan of Care  Ms. Delphine S Thalman has a current medication list which includes the following long-term medication(s): bumetanide, diltiazem, donepezil, duloxetine, losartan, metformin, pantoprazole, propranolol, trazodone, apixaban, digoxin, and pravastatin.  Postprocedural evaluation after bilateral suprascapular nerve block on 01/22/2020.  Per collateral from the patient's daughter who is her caregiver, patient has experienced and continues to experience significant pain relief in regards to bilateral shoulder pain and also has better range of motion.  She is very pleased with the results from the suprascapular nerve block.  I informed the patient's daughter that we can repeat this in the future should she have return of bilateral shoulder pain and can also consider pulsed radiofrequency ablation in the future.  Patient is now endorsing bilateral hip pain, right greater than left.  This is related to bilateral knee osteoarthritis.  She has done physical therapy in the past.  We discussed intra-articular hip steroid injections under fluoroscopy.  Risks and benefits reviewed and patient would like to proceed.  Plan: -Repeat bilateral suprascapular nerve block as needed, consider pulsed RFA -Plan for bilateral intra-articular hip steroid injections for pain secondary to hip osteoarthritis   Orders:  Orders Placed This Encounter  Procedures  . HIP INJECTION    Standing Status:   Future    Standing Expiration Date:   05/26/2020    Scheduling Instructions:     Side: Bilateral     Sedation: without     Timeframe: As  soon as schedule allows  . SUPRASCAPULAR NERVE BLOCK    CLINICAL INDICATIONS: For shoulder pain.    Standing Status:   Standing    Number of Occurrences:   1     Standing Expiration Date:   02/23/2021    Scheduling Instructions:     Purpose: therapeutic     Laterality: Bilateral     Level(s): Suprascapular notch     Sedation: Patient's choice.     TIMEFRAME: PRN procedure. (Ms. Mccraw will call when needed.)    Order Specific Question:   Where will this procedure be performed?    Answer:   [facility]   Follow-up plan:   Return in about 1 week (around 03/02/2020) for Bilateral hip steroid, without sedation.     Status post bilateral suprascapular nerve block 01/22/2020.  Consider pulsed RFA.    Recent Visits Date Type Provider Dept  01/22/20 Procedure visit Gillis Santa, MD [facility]  01/20/20 Office Visit Gillis Santa, MD [facility]  Showing recent visits within past 90 days and meeting all other requirements Today's Visits Date Type Provider Dept  02/24/20 Telemedicine Gillis Santa, MD [facility]  Showing today's visits and meeting all other requirements Future Appointments No visits were found meeting these conditions. Showing future appointments within next 90 days and meeting all other requirements  I discussed the assessment and treatment plan with the patient. The patient was provided an opportunity to ask questions and all were answered. The patient agreed with the plan and demonstrated an understanding of the instructions.  Patient advised to call back or seek an in-person evaluation if the symptoms or condition worsens.  Duration of encounter: 30 minutes.  Note by: Gillis Santa, MD Date: 02/24/2020; Time: 11:04 AM

## 2020-03-02 ENCOUNTER — Other Ambulatory Visit: Payer: Self-pay

## 2020-03-02 ENCOUNTER — Ambulatory Visit
Admission: RE | Admit: 2020-03-02 | Discharge: 2020-03-02 | Disposition: A | Discharge status: Home / Self Care | Payer: Medicare Other | Source: Ambulatory Visit | Attending: Student in an Organized Health Care Education/Training Program | Admitting: Student in an Organized Health Care Education/Training Program

## 2020-03-02 ENCOUNTER — Ambulatory Visit (HOSPITAL_BASED_OUTPATIENT_CLINIC_OR_DEPARTMENT_OTHER): Payer: Medicare Other | Admitting: Student in an Organized Health Care Education/Training Program

## 2020-03-02 ENCOUNTER — Encounter: Payer: Self-pay | Admitting: Student in an Organized Health Care Education/Training Program

## 2020-03-02 VITALS — BP 118/84 | HR 133 | Temp 97.1°F | Resp 28 | Ht 63.0 in | Wt 145.0 lb

## 2020-03-02 DIAGNOSIS — M1611 Unilateral primary osteoarthritis, right hip: Secondary | ICD-10-CM

## 2020-03-02 DIAGNOSIS — G894 Chronic pain syndrome: Secondary | ICD-10-CM | POA: Diagnosis present

## 2020-03-02 DIAGNOSIS — M1612 Unilateral primary osteoarthritis, left hip: Secondary | ICD-10-CM | POA: Insufficient documentation

## 2020-03-02 MED ORDER — ROPIVACAINE HCL 2 MG/ML IJ SOLN
9.0000 mL | Freq: Once | INTRAMUSCULAR | Status: AC
  Administered 2020-03-02: 9 mL via INTRA_ARTICULAR

## 2020-03-02 MED ORDER — METHYLPREDNISOLONE ACETATE 40 MG/ML IJ SUSP
40.0000 mg | Freq: Once | INTRAMUSCULAR | Status: AC
  Administered 2020-03-02: 40 mg via INTRA_ARTICULAR

## 2020-03-02 MED ORDER — LIDOCAINE HCL 2 % IJ SOLN
INTRAMUSCULAR | Status: AC
  Filled 2020-03-02: qty 20

## 2020-03-02 MED ORDER — LIDOCAINE HCL 2 % IJ SOLN
20.0000 mL | Freq: Once | INTRAMUSCULAR | Status: AC
  Administered 2020-03-02: 400 mg

## 2020-03-02 MED ORDER — IOPAMIDOL (ISOVUE-M 200) INJECTION 41%
10.0000 mL | Freq: Once | INTRAMUSCULAR | Status: AC
  Administered 2020-03-02: 10 mL via INTRA_ARTICULAR

## 2020-03-02 MED ORDER — IOHEXOL 180 MG/ML  SOLN
INTRAMUSCULAR | Status: AC
  Filled 2020-03-02: qty 20

## 2020-03-02 MED ORDER — ROPIVACAINE HCL 2 MG/ML IJ SOLN
INTRAMUSCULAR | Status: AC
  Filled 2020-03-02: qty 10

## 2020-03-02 MED ORDER — METHYLPREDNISOLONE ACETATE 80 MG/ML IJ SUSP
INTRAMUSCULAR | Status: AC
  Filled 2020-03-02: qty 1

## 2020-03-02 MED ORDER — METHYLPREDNISOLONE ACETATE 40 MG/ML IJ SUSP
INTRAMUSCULAR | Status: AC
  Filled 2020-03-02: qty 1

## 2020-03-02 NOTE — Progress Notes (Signed)
PROVIDER NOTE: Information contained herein reflects review and annotations entered in association with encounter. Interpretation of such information and data should be left to medically-trained personnel. Information provided to patient can be located elsewhere in the medical record under "Patient Instructions". Document created using STT-dictation technology, any transcriptional errors that may result from process are unintentional.    Patient: Loretta Webb  Service Category: Procedure  Provider: Edward Jolly, MD  DOB: 1932-01-09  DOS: 03/02/2020  Location: [facility]  MRN: [id number]  Setting: Ambulatory - outpatient  Referring Provider: [name], MD  Type: Established Patient  Specialty: Interventional Pain Management  PCP: [name], MD   Primary Reason for Visit: Interventional Pain Management Treatment. CC: Hip Pain (bilateral)  Procedure:          Anesthesia, Analgesia, Anxiolysis:  Type: Intra-Articular Hip Injection #1  Primary Purpose: Diagnostic Region: Anterolateral hip joint area. Level: Lower pelvic and hip joint level. Target Area: Superior aspect of the hip joint cavity, going thru the superior portion of the capsular ligament. Approach: Anterior approach. Laterality: Bilateral  Type: Local Anesthesia  Local Anesthetic: Lidocaine 1-2%  Position: Supine Prepped Area: Entire Anterolateral hip area. DuraPrep (Iodine Povacrylex [0.7% available iodine] and Isopropyl Alcohol, 74% w/w)   Indications: 1. Primary osteoarthritis of right hip   2. Primary osteoarthritis of left hip   3. Chronic pain syndrome    Pain Score: Pre-procedure:  (10/10 right, 5/10 left)/10 Post-procedure: 0-No pain (standing and sitting in wheelchair)/10   Pre-op Assessment:  Loretta Webb is a 86 y.o. (year old), female patient, seen today for interventional treatment. She  has a past surgical history that includes knee replacements; Abdominal hysterectomy; Rotator cuff  repair; and Appendectomy. Loretta Webb has a current medication list which includes the following prescription(s): acetaminophen, alprazolam, aspirin, aspirin, bumetanide, cholecalciferol, vitamin b-12, diclofenac sodium, diltiazem, donepezil, duloxetine, hydrocodone-acetaminophen, losartan, metformin, pantoprazole, propranolol, trazodone, apixaban, digoxin, and pravastatin. Her primarily concern today is the Hip Pain (bilateral)  Initial Vital Signs:  Pulse/HCG Rate: 93  Temp: (!) 97.1 F (36.2 C) Resp: 16 BP: (!) 140/93 SpO2: 96 %  BMI: Estimated body mass index is 25.69 kg/m as calculated from the following:   Height as of this encounter: 5\' 3"  (1.6 m).   Weight as of this encounter: 145 lb (65.8 kg).  Risk Assessment: Allergies: Reviewed. She has No Known Allergies.  Allergy Precautions: None required Coagulopathies: Reviewed. None identified.  Blood-thinner therapy: None at this time Active Infection(s): Reviewed. None identified. Loretta Webb is afebrile  Site Confirmation: Loretta Webb was asked to confirm the procedure and laterality before marking the site Procedure checklist: Completed Consent: Before the procedure and under the influence of no sedative(s), amnesic(s), or anxiolytics, the patient was informed of the treatment options, risks and possible complications. To fulfill our ethical and legal obligations, as recommended by the American Medical Association's Code of Ethics, I have informed the patient of my clinical impression; the nature and purpose of the treatment or procedure; the risks, benefits, and possible complications of the intervention; the alternatives, including doing nothing; the risk(s) and benefit(s) of the alternative treatment(s) or procedure(s); and the risk(s) and benefit(s) of doing nothing. The patient was provided information about the general risks and possible complications associated with the procedure. These may include, but are not limited to: failure  to achieve desired goals, infection, bleeding, organ or nerve damage, allergic reactions, paralysis, and death. In addition, the patient was informed of those risks and complications  associated to the procedure, such as failure to decrease pain; infection; bleeding; organ or nerve damage with subsequent damage to sensory, motor, and/or autonomic systems, resulting in permanent pain, numbness, and/or weakness of one or several areas of the body; allergic reactions; (i.e.: anaphylactic reaction); and/or death. Furthermore, the patient was informed of those risks and complications associated with the medications. These include, but are not limited to: allergic reactions (i.e.: anaphylactic or anaphylactoid reaction(s)); adrenal axis suppression; blood sugar elevation that in diabetics may result in ketoacidosis or comma; water retention that in patients with history of congestive heart failure may result in shortness of breath, pulmonary edema, and decompensation with resultant heart failure; weight gain; swelling or edema; medication-induced neural toxicity; particulate matter embolism and blood vessel occlusion with resultant organ, and/or nervous system infarction; and/or aseptic necrosis of one or more joints. Finally, the patient was informed that Medicine is not an exact science; therefore, there is also the possibility of unforeseen or unpredictable risks and/or possible complications that may result in a catastrophic outcome. The patient indicated having understood very clearly. We have given the patient no guarantees and we have made no promises. Enough time was given to the patient to ask questions, all of which were answered to the patient's satisfaction. Loretta Webb has indicated that she wanted to continue with the procedure. Attestation: I, the ordering provider, attest that I have discussed with the patient the benefits, risks, side-effects, alternatives, likelihood of achieving goals, and potential  problems during recovery for the procedure that I have provided informed consent. Date  Time: 03/02/2020  8:54 AM  Pre-Procedure Preparation:  Monitoring: As per clinic protocol. Respiration, ETCO2, SpO2, BP, heart rate and rhythm monitor placed and checked for adequate function Safety Precautions: Patient was assessed for positional comfort and pressure points before starting the procedure. Time-out: I initiated and conducted the "Time-out" before starting the procedure, as per protocol. The patient was asked to participate by confirming the accuracy of the "Time Out" information. Verification of the correct person, site, and procedure were performed and confirmed by me, the nursing staff, and the patient. "Time-out" conducted as per Joint Commission's Universal Protocol (UP.01.01.01). Time: 0941  Description of Procedure:          Safety Precautions: Aspiration looking for blood return was conducted prior to all injections. At no point did we inject any substances, as a needle was being advanced. No attempts were made at seeking any paresthesias. Safe injection practices and needle disposal techniques used. Medications properly checked for expiration dates. SDV (single dose vial) medications used. Description of the Procedure: Protocol guidelines were followed. The patient was placed in position over the fluoroscopy table. The target area was identified and the area prepped in the usual manner. Skin & deeper tissues infiltrated with local anesthetic. Appropriate amount of time allowed to pass for local anesthetics to take effect. The procedure needles were then advanced to the target area. Proper needle placement secured. Negative aspiration confirmed. Solution injected in intermittent fashion, asking for systemic symptoms every 0.5cc of injectate. The needles were then removed and the area cleansed, making sure to leave some of the prepping solution back to take advantage of its long term bactericidal  properties. Vitals:   03/02/20 0916 03/02/20 0940 03/02/20 0949 03/02/20 0958  BP: (!) 140/93 (!) 142/63 120/88 118/84  Pulse: 93 (!) 135 (!) 132 (!) 133  Resp: 16 (!) 25 (!) 29 (!) 28  Temp: (!) 97.1 F (36.2 C)     SpO2:  96% 97% 100% 100%  Weight: 145 lb (65.8 kg)     Height: 5\' 3"  (1.6 m)       Start Time: 0941 hrs. End Time:   hrs. Materials:  Needle(s) Type: Spinal Needle Gauge: 25G Length: 3.5-in Medication(s): Please see orders for medications and dosing details. 8 cc solution made of 6 cc of 0.2% ropivacaine, 2 cc of methylprednisolone, 40 mg/cc. 4 cc injected into left intra-articular hip joint 4 cc injected into right intra-articular hip joint Imaging Guidance (Non-Spinal):          Type of Imaging Technique: Fluoroscopy Guidance (Non-Spinal) Indication(s): Assistance in needle guidance and placement for procedures requiring needle placement in or near specific anatomical locations not easily accessible without such assistance. Exposure Time: Please see nurses notes. Contrast: Before injecting any contrast, we confirmed that the patient did not have an allergy to iodine, shellfish, or radiological contrast. Once satisfactory needle placement was completed at the desired level, radiological contrast was injected. Contrast injected under live fluoroscopy. No contrast complications. See chart for type and volume of contrast used. Fluoroscopic Guidance: I was personally present during the use of fluoroscopy. "Tunnel Vision Technique" used to obtain the best possible view of the target area. Parallax error corrected before commencing the procedure. "Direction-depth-direction" technique used to introduce the needle under continuous pulsed fluoroscopy. Once target was reached, antero-posterior, oblique, and lateral fluoroscopic projection used confirm needle placement in all planes. Images permanently stored in EMR. Interpretation: I personally interpreted the imaging intraoperatively.  Adequate needle placement confirmed in multiple planes. Appropriate spread of contrast into desired area was observed. No evidence of afferent or efferent intravascular uptake. Permanent images saved into the patient's record.  Antibiotic Prophylaxis:   Anti-infectives (From admission, onward)   None     Indication(s): None identified  Post-operative Assessment:  Post-procedure Vital Signs:  Pulse/HCG Rate: (!) 133  Temp: (!) 97.1 F (36.2 C) Resp: (!) 28 BP: 118/84 SpO2: 100 %  EBL: None  Complications: No immediate post-treatment complications observed by team, or reported by patient.  Note: The patient tolerated the entire procedure well. A repeat set of vitals were taken after the procedure and the patient was kept under observation following institutional policy, for this type of procedure. Post-procedural neurological assessment was performed, showing return to baseline, prior to discharge. The patient was provided with post-procedure discharge instructions, including a section on how to identify potential problems. Should any problems arise concerning this procedure, the patient was given instructions to immediately contact , at any time, without hesitation. In any case, we plan to contact the patient by telephone for a follow-up status report regarding this interventional procedure.  Comments:  No additional relevant information.  Plan of Care  Orders:  Orders Placed This Encounter  Procedures  . DG PAIN CLINIC C-ARM 1-60 MIN NO REPORT    Intraoperative interpretation by procedural physician at [facility].    Standing Status:   Standing    Number of Occurrences:   1    Order Specific Question:   Reason for exam:    Answer:   Assistance in needle guidance and placement for procedures requiring needle placement in or near specific anatomical locations not easily accessible without such assistance.   Medications ordered for procedure: Meds ordered this  encounter  Medications  . iopamidol (ISOVUE-M) 41 % intrathecal injection 10 mL  . ropivacaine (PF) 2 mg/mL (0.2%) (NAROPIN) injection 9 mL  . methylPREDNISolone acetate (DEPO-MEDROL) injection 40 mg  . methylPREDNISolone  acetate (DEPO-MEDROL) injection 40 mg  . lidocaine (XYLOCAINE) 2 % (with pres) injection 400 mg   Medications administered: We administered iopamidol, ropivacaine (PF) 2 mg/mL (0.2%), methylPREDNISolone acetate, methylPREDNISolone acetate, and lidocaine.  See the medical record for exact dosing, route, and time of administration.  Follow-up plan:   Return in about 5 weeks (around 04/06/2020) for Post Procedure Evaluation, virtual.      Status post bilateral suprascapular nerve block 01/22/2020.  Consider pulsed RFA.  Bilateral intra-articular hip steroid injection 03/02/2020.    Recent Visits Date Type Provider Dept  02/24/20 Telemedicine Edward JollyLateef, Jerico Grisso, MD [facility]  01/22/20 Procedure visit Edward JollyLateef, Stanislaw Acton, MD [facility]  01/20/20 Office Visit Edward JollyLateef, Tajah Noguchi, MD [facility]  Showing recent visits within past 90 days and meeting all other requirements Today's Visits Date Type Provider Dept  03/02/20 Procedure visit Edward JollyLateef, Yailine Ballard, MD [facility]  Showing today's visits and meeting all other requirements Future Appointments Date Type Provider Dept  04/06/20 Appointment Edward JollyLateef, Jonelle Bann, MD [facility]  Showing future appointments within next 90 days and meeting all other requirements  Disposition: Discharge home  Discharge (Date  Time): 03/02/2020;   hrs.   Primary Care Physician: Danella PentonMiller, Mark F, MD [facility] Note by: Edward JollyBilal Zollie Ellery, MD Date: 03/02/2020; Time: 10:06 AM  Disclaimer:  Medicine is not an exact science. The only guarantee in medicine is that nothing is guaranteed. It is important to note that the decision to proceed with this intervention was based on the  information collected from the patient. The Data and conclusions were drawn from the patient's questionnaire, the interview, and the physical examination. Because the information was provided in large part by the patient, it cannot be guaranteed that it has not been purposely or unconsciously manipulated. Every effort has been made to obtain as much relevant data as possible for this evaluation. It is important to note that the conclusions that lead to this procedure are derived in large part from the available data. Always take into account that the treatment will also be dependent on availability of resources and existing treatment guidelines, considered by other Pain Management Practitioners as being common knowledge and practice, at the time of the intervention. For Medico-Legal purposes, it is also important to point out that variation in procedural techniques and pharmacological choices are the acceptable norm. The indications, contraindications, technique, and results of the above procedure should only be interpreted and judged by a Board-Certified Interventional Pain Specialist with extensive familiarity and expertise in the same exact procedure and technique.

## 2020-03-02 NOTE — Progress Notes (Signed)
Safety precautions to be maintained throughout the outpatient stay will include: orient to surroundings, keep bed in low position, maintain call bell within reach at all times, provide assistance with transfer out of bed and ambulation.  

## 2020-03-03 ENCOUNTER — Telehealth: Payer: Self-pay

## 2020-03-03 NOTE — Telephone Encounter (Signed)
Post procedure phone call.  LM 

## 2020-03-17 ENCOUNTER — Telehealth: Payer: Self-pay | Admitting: Student in an Organized Health Care Education/Training Program

## 2020-03-17 NOTE — Telephone Encounter (Signed)
Spoke with Dr Cherylann Ratel and he recommends that she ice the shoulder and if continue to monitor over the weekend.  If no better next week have her come in on Monday or Wednesday for shoulder injection.  Called Debbie to let her know this plan, she is agreeable to this.  Transferred up front for scheduling.

## 2020-03-17 NOTE — Telephone Encounter (Signed)
Spoke with Loretta Webb, patient's mother and she is having pain in her left shoulder so much that she is unable to lift it up or move it very much.  Dr Cherylann Ratel has injected both shoulders previously and she got very good results.  Mrs Kamphuis is working with PT in her home and they have been concentrating on upper body in an effort to get her more able to use her cane.  She is not sure if this has aggravated the problem or not.  I told her that I feel it would be good to have Dr Cherylann Ratel to evaluate and have looked at the schedule and think there is possibly a spot to bring her in tomorrow for evaluation.  She is not sure if she would be able to do this or not but she will check with her sister to see if she might be able to bring her.  She will check and I will check with scheduling.

## 2020-03-17 NOTE — Telephone Encounter (Signed)
Patient's Daughter called stating patients left shoulder is really hurting to the point she cannot lift her arm and screams when someone moves it. Please call to discuss and give advise.  [phone] Matthias Hughs

## 2020-03-19 ENCOUNTER — Telehealth: Payer: Self-pay

## 2020-03-19 NOTE — Telephone Encounter (Signed)
Her daughter called and said that the shoulder pain that her mother told you about is not really shoulder, the pain is in left arm about a third of the way from her shoulder to her elbow and it seems to muscular. She states that it was injured when they were getting her off the table at Dr. Yves Dill office months ago.  She cant move the arm and its very painful. She wanted [place] to be updated before her appt Monday. She wanted [place] all to be on the same page with where her pain is. I told her I would pass on the information and it would be addressed at her appointment on Monday.

## 2020-03-23 ENCOUNTER — Ambulatory Visit (HOSPITAL_BASED_OUTPATIENT_CLINIC_OR_DEPARTMENT_OTHER): Payer: Medicare Other | Admitting: Student in an Organized Health Care Education/Training Program

## 2020-03-23 ENCOUNTER — Encounter: Payer: Self-pay | Admitting: Student in an Organized Health Care Education/Training Program

## 2020-03-23 ENCOUNTER — Other Ambulatory Visit: Payer: Self-pay

## 2020-03-23 ENCOUNTER — Ambulatory Visit
Admission: RE | Admit: 2020-03-23 | Discharge: 2020-03-23 | Disposition: A | Discharge status: Home / Self Care | Payer: Medicare Other | Source: Ambulatory Visit | Attending: Student in an Organized Health Care Education/Training Program | Admitting: Student in an Organized Health Care Education/Training Program

## 2020-03-23 VITALS — BP 121/77 | HR 132 | Temp 96.9°F | Resp 20 | Ht 63.0 in | Wt 145.0 lb

## 2020-03-23 DIAGNOSIS — M19011 Primary osteoarthritis, right shoulder: Secondary | ICD-10-CM | POA: Insufficient documentation

## 2020-03-23 DIAGNOSIS — Z8739 Personal history of other diseases of the musculoskeletal system and connective tissue: Secondary | ICD-10-CM | POA: Insufficient documentation

## 2020-03-23 DIAGNOSIS — M25512 Pain in left shoulder: Secondary | ICD-10-CM | POA: Diagnosis present

## 2020-03-23 DIAGNOSIS — M19012 Primary osteoarthritis, left shoulder: Secondary | ICD-10-CM | POA: Insufficient documentation

## 2020-03-23 DIAGNOSIS — M25511 Pain in right shoulder: Secondary | ICD-10-CM

## 2020-03-23 DIAGNOSIS — G8929 Other chronic pain: Secondary | ICD-10-CM | POA: Diagnosis present

## 2020-03-23 DIAGNOSIS — G894 Chronic pain syndrome: Secondary | ICD-10-CM

## 2020-03-23 MED ORDER — DEXAMETHASONE SODIUM PHOSPHATE 10 MG/ML IJ SOLN
10.0000 mg | Freq: Once | INTRAMUSCULAR | Status: AC
  Administered 2020-03-23: 10 mg

## 2020-03-23 MED ORDER — DEXAMETHASONE SODIUM PHOSPHATE 10 MG/ML IJ SOLN
INTRAMUSCULAR | Status: AC
  Filled 2020-03-23: qty 1

## 2020-03-23 MED ORDER — LIDOCAINE HCL 2 % IJ SOLN
20.0000 mL | Freq: Once | INTRAMUSCULAR | Status: AC
  Administered 2020-03-23: 400 mg

## 2020-03-23 MED ORDER — TRIAMCINOLONE ACETONIDE 40 MG/ML IJ SUSP: 40.0000 mg | Freq: Once | INTRAMUSCULAR | Status: DC

## 2020-03-23 MED ORDER — LIDOCAINE HCL 2 % IJ SOLN
INTRAMUSCULAR | Status: AC
  Filled 2020-03-23: qty 20

## 2020-03-23 MED ORDER — IOHEXOL 180 MG/ML  SOLN
10.0000 mL | Freq: Once | INTRAMUSCULAR | Status: AC
  Administered 2020-03-23: 10 mL via INTRA_ARTICULAR

## 2020-03-23 MED ORDER — ROPIVACAINE HCL 2 MG/ML IJ SOLN
4.0000 mL | Freq: Once | INTRAMUSCULAR | Status: AC
  Administered 2020-03-23: 4 mL via PERINEURAL
  Filled 2020-03-23: qty 10

## 2020-03-23 NOTE — Progress Notes (Signed)
Safety precautions to be maintained throughout the outpatient stay will include: orient to surroundings, keep bed in low position, maintain call bell within reach at all times, provide assistance with transfer out of bed and ambulation.  

## 2020-03-23 NOTE — Patient Instructions (Signed)

## 2020-03-23 NOTE — Progress Notes (Signed)
PROVIDER NOTE: Information contained herein reflects review and annotations entered in association with encounter. Interpretation of such information and data should be left to medically-trained personnel. Information provided to patient can be located elsewhere in the medical record under "Patient Instructions". Document created using STT-dictation technology, any transcriptional errors that may result from process are unintentional.    Patient: Loretta Webb  Service Category: Procedure  Provider: Edward Jolly, MD  DOB: Nov 15, 1931  DOS: 03/23/2020  Location: [facility]  MRN: [id number]  Setting: Ambulatory - outpatient  Referring Provider: [name], MD  Type: Established Patient  Specialty: Interventional Pain Management  PCP: [name], MD   Primary Reason for Visit: Interventional Pain Management Treatment. CC: Arm Pain (arm and shoulder pain left )  Procedure:          Anesthesia, Analgesia, Anxiolysis:  Type: Therapeutic Suprascapular nerve Block #2  Primary Purpose: Therapeutic Region: Posterior Shoulder & Scapular Areas Level: Superior to the scapular spine, in the lateral aspect of the supraspinatus fossa (Suprascapular notch). Target Area: Suprascapular nerve as it passes thru the lower portion of the suprascapular notch. Approach: Posterior percutaneous approach. Laterality: Left-Side  Type: Local Anesthesia  Local Anesthetic: Lidocaine 1-2%  Position: Prone   Indications: 1. Chronic shoulder pain (3ry area of Pain) (Bilateral) (L>R)   2. Localized osteoarthritis of shoulder regions, bilateral   3. History of rotator cuff syndrome (bilateral)   4. Chronic pain syndrome    Pain Score: Pre-procedure: 10-Worst pain ever (with movement.)/10 Post-procedure: 0-No pain/10   Pre-op Assessment:  Loretta Webb is a 86 y.o. (year old), female patient, seen today for interventional treatment. She  has a past surgical history that includes knee replacements;  Abdominal hysterectomy; Rotator cuff repair; and Appendectomy. Loretta Webb has a current medication list which includes the following prescription(s): acetaminophen, alprazolam, aspirin, bumetanide, cholecalciferol, vitamin b-12, diclofenac sodium, diltiazem, donepezil, duloxetine, hydrocodone-acetaminophen, losartan, magnesium, metformin, pantoprazole, propranolol, trazodone, apixaban, aspirin, digoxin, and pravastatin. Her primarily concern today is the Arm Pain (arm and shoulder pain left )  Initial Vital Signs:  Pulse/HCG Rate: (!) 52  Temp: (!) 96.9 F (36.1 C) Resp: 16 BP: 133/89 SpO2: 94 %  BMI: Estimated body mass index is 25.69 kg/m as calculated from the following:   Height as of this encounter: 5\' 3"  (1.6 m).   Weight as of this encounter: 145 lb (65.8 kg).  Risk Assessment: Allergies: Reviewed. She has No Known Allergies.  Allergy Precautions: None required Coagulopathies: Reviewed. None identified.  Blood-thinner therapy: None at this time Active Infection(s): Reviewed. None identified. Loretta Webb is afebrile  Site Confirmation: Loretta Webb was asked to confirm the procedure and laterality before marking the site Procedure checklist: Completed Consent: Before the procedure and under the influence of no sedative(s), amnesic(s), or anxiolytics, the patient was informed of the treatment options, risks and possible complications. To fulfill our ethical and legal obligations, as recommended by the American Medical Association's Code of Ethics, I have informed the patient of my clinical impression; the nature and purpose of the treatment or procedure; the risks, benefits, and possible complications of the intervention; the alternatives, including doing nothing; the risk(s) and benefit(s) of the alternative treatment(s) or procedure(s); and the risk(s) and benefit(s) of doing nothing. The patient was provided information about the general risks and possible complications associated with  the procedure. These may include, but are not limited to: failure to achieve desired goals, infection, bleeding, organ or nerve damage, allergic reactions, paralysis, and  death. In addition, the patient was informed of those risks and complications associated to the procedure, such as failure to decrease pain; infection; bleeding; organ or nerve damage with subsequent damage to sensory, motor, and/or autonomic systems, resulting in permanent pain, numbness, and/or weakness of one or several areas of the body; allergic reactions; (i.e.: anaphylactic reaction); and/or death. Furthermore, the patient was informed of those risks and complications associated with the medications. These include, but are not limited to: allergic reactions (i.e.: anaphylactic or anaphylactoid reaction(s)); adrenal axis suppression; blood sugar elevation that in diabetics may result in ketoacidosis or comma; water retention that in patients with history of congestive heart failure may result in shortness of breath, pulmonary edema, and decompensation with resultant heart failure; weight gain; swelling or edema; medication-induced neural toxicity; particulate matter embolism and blood vessel occlusion with resultant organ, and/or nervous system infarction; and/or aseptic necrosis of one or more joints. Finally, the patient was informed that Medicine is not an exact science; therefore, there is also the possibility of unforeseen or unpredictable risks and/or possible complications that may result in a catastrophic outcome. The patient indicated having understood very clearly. We have given the patient no guarantees and we have made no promises. Enough time was given to the patient to ask questions, all of which were answered to the patient's satisfaction. Loretta Webb has indicated that she wanted to continue with the procedure. Attestation: I, the ordering provider, attest that I have discussed with the patient the benefits, risks,  side-effects, alternatives, likelihood of achieving goals, and potential problems during recovery for the procedure that I have provided informed consent. Date  Time: 03/23/2020 12:56 PM  Pre-Procedure Preparation:  Monitoring: As per clinic protocol. Respiration, ETCO2, SpO2, BP, heart rate and rhythm monitor placed and checked for adequate function Safety Precautions: Patient was assessed for positional comfort and pressure points before starting the procedure. Time-out: I initiated and conducted the "Time-out" before starting the procedure, as per protocol. The patient was asked to participate by confirming the accuracy of the "Time Out" information. Verification of the correct person, site, and procedure were performed and confirmed by me, the nursing staff, and the patient. "Time-out" conducted as per Joint Commission's Universal Protocol (UP.01.01.01). Time: 1340  Description of Procedure:          Area Prepped: Entire shoulder Area DuraPrep (Iodine Povacrylex [0.7% available iodine] and Isopropyl Alcohol, 74% w/w) Safety Precautions: Aspiration looking for blood return was conducted prior to all injections. At no point did we inject any substances, as a needle was being advanced. No attempts were made at seeking any paresthesias. Safe injection practices and needle disposal techniques used. Medications properly checked for expiration dates. SDV (single dose vial) medications used. Description of the Procedure: Protocol guidelines were followed. The patient was placed in position over the procedure table. The target area was identified and the area prepped in the usual manner. Skin & deeper tissues infiltrated with local anesthetic. Appropriate amount of time allowed to pass for local anesthetics to take effect. The procedure needles were then advanced to the target area. Proper needle placement secured. Negative aspiration confirmed. Solution injected in intermittent fashion, asking for systemic  symptoms every 0.5cc of injectate. The needles were then removed and the area cleansed, making sure to leave some of the prepping solution back to take advantage of its long term bactericidal properties.  Vitals:   03/23/20 1307 03/23/20 1340 03/23/20 1347  BP: 133/89 132/81 121/77  Pulse: (!) 52 (!) 123 Marland Kitchen)  132  Resp: 16 20 20   Temp: (!) 96.9 F (36.1 C)    TempSrc: Temporal    SpO2: 94% 95% 95%  Weight: 145 lb (65.8 kg)    Height: 5\' 3"  (1.6 m)      Start Time: 1340 hrs. End Time: 1347 hrs. Materials:  Needle(s) Type: Spinal Needle Gauge: 25G Length: 3.5-in Medication(s): Please see orders for medications and dosing details. 4 cc solution made of 3 cc of 0.2% ropivacaine, 1 cc of Decadron, 10 mg/cc.  Injected for the left suprascapular nerve   Imaging Guidance (Non-Spinal):          Type of Imaging Technique: Fluoroscopy Guidance (Non-Spinal) and ultrasound guidance to visualize suprascapular notch and suprascapular artery Indication(s): Assistance in needle guidance and placement for procedures requiring needle placement in or near specific anatomical locations not easily accessible without such assistance. Exposure Time: Please see nurses notes. Contrast: Before injecting any contrast, we confirmed that the patient did not have an allergy to iodine, shellfish, or radiological contrast. Once satisfactory needle placement was completed at the desired level, radiological contrast was injected. Contrast injected under live fluoroscopy. No contrast complications. See chart for type and volume of contrast used. Fluoroscopic Guidance: I was personally present during the use of fluoroscopy. "Tunnel Vision Technique" used to obtain the best possible view of the target area. Parallax error corrected before commencing the procedure. "Direction-depth-direction" technique used to introduce the needle under continuous pulsed fluoroscopy. Once target was reached, antero-posterior, oblique, and  lateral fluoroscopic projection used confirm needle placement in all planes. Images permanently stored in EMR. Interpretation: I personally interpreted the imaging intraoperatively. Adequate needle placement confirmed in multiple planes. Appropriate spread of contrast into desired area was observed. No evidence of afferent or efferent intravascular uptake. Permanent images saved into the patient's record.  Antibiotic Prophylaxis:   Anti-infectives (From admission, onward)   None     Indication(s): None identified  Post-operative Assessment:  Post-procedure Vital Signs:  Pulse/HCG Rate: (!) 132  Temp: (!) 96.9 F (36.1 C) Resp: 20 BP: 121/77 SpO2: 95 %  EBL: None  Complications: No immediate post-treatment complications observed by team, or reported by patient.  Note: The patient tolerated the entire procedure well. A repeat set of vitals were taken after the procedure and the patient was kept under observation following institutional policy, for this type of procedure. Post-procedural neurological assessment was performed, showing return to baseline, prior to discharge. The patient was provided with post-procedure discharge instructions, including a section on how to identify potential problems. Should any problems arise concerning this procedure, the patient was given instructions to immediately contact , at any time, without hesitation. In any case, we plan to contact the patient by telephone for a follow-up status report regarding this interventional procedure.  Comments:  No additional relevant information.  Plan of Care  Orders:  Orders Placed This Encounter  Procedures  . SUPRASCAPULAR NERVE BLOCK    For shoulder pain.    Standing Status:   Future    Standing Expiration Date:   06/23/2020    Scheduling Instructions:     Left SSNB #2    Order Specific Question:   Where will this procedure be performed?    Answer:   [facility]  . DG PA[facility] C-ARM 1-60 MIN NO  REPORT    Intraoperative interpretation by procedural physician at [facility].    Standing Status:   Standing    Number of Occurrences:   1  Order Specific Question:   Reason for exam:    Answer:   Assistance in needle guidance and placement for procedures requiring needle placement in or near specific anatomical locations not easily accessible without such assistance.    Medications ordered for procedure: Meds ordered this encounter  Medications  . iohexol (OMNIPAQUE) 180 MG/ML injection 10 mL    Must be Myelogram-compatible. If not available, you may substitute with a water-soluble, non-ionic, hypoallergenic, myelogram-compatible radiological contrast medium.  . ropivacaine (PF) 2 mg/mL (0.2%) (NAROPIN) injection 4 mL  . DISCONTD: triamcinolone acetonide (KENALOG-40) injection 40 mg  . dexamethasone (DECADRON) injection 10 mg  . lidocaine (XYLOCAINE) 2 % (with pres) injection 400 mg   Medications administered: We administered iohexol, ropivacaine (PF) 2 mg/mL (0.2%), dexamethasone, and lidocaine.  See the medical record for exact dosing, route, and time of administration.  Follow-up plan:   Return in about 4 weeks (around 04/20/2020) for Post Procedure Evaluation, virtual.      Status post bilateral suprascapular nerve block 01/22/2020, left 03/23/2020.  Consider pulsed RFA. Status post bilateral intra-articular hip steroid injection 03/02/2020, 100% pain relief, repeat as needed.   Recent Visits Date Type Provider Dept  03/02/20 Procedure visit Edward JollyLateef, Larose Batres, MD [facility]  02/24/20 Telemedicine Edward JollyLateef, Dodge Ator, MD [facility]  01/22/20 Procedure visit Edward JollyLateef, Aydan Levitz, MD [facility]  01/20/20 Office Visit Edward JollyLateef, Najiyah Paris, MD [facility]  Showing recent visits within past 90 days and meeting all other requirements Today's Visits Date Type Provider Dept  03/23/20 Office Visit Edward JollyLateef, Atia Haupt, MD [facility]  Showing today's  visits and meeting all other requirements Future Appointments Date Type Provider Dept  04/06/20 Appointment Edward JollyLateef, Kynsleigh Westendorf, MD [facility]  04/21/20 Appointment Delano MetzNaveira, Francisco, MD [facility]  Showing future appointments within next 90 days and meeting all other requirements  Disposition: Discharge home  Discharge (Date  Time): 03/23/2020; 1400 hrs.   Primary Care Physician: Danella PentonMiller, Mark F, MD [facility] Note by: Edward JollyBilal Kandi Brusseau, MD Date: 03/23/2020; Time: 2:05 PM  Disclaimer:  Medicine is not an exact science. The only guarantee in medicine is that nothing is guaranteed. It is important to note that the decision to proceed with this intervention was based on the information collected from the patient. The Data and conclusions were drawn from the patient's questionnaire, the interview, and the physical examination. Because the information was provided in large part by the patient, it cannot be guaranteed that it has not been purposely or unconsciously manipulated. Every effort has been made to obtain as much relevant data as possible for this evaluation. It is important to note that the conclusions that lead to this procedure are derived in large part from the available data. Always take into account that the treatment will also be dependent on availability of resources and existing treatment guidelines, considered by other Pain Management Practitioners as being common knowledge and practice, at the time of the intervention. For Medico-Legal purposes, it is also important to point out that variation in procedural techniques and pharmacological choices are the acceptable norm. The indications, contraindications, technique, and results of the above procedure should only be interpreted and judged by a Board-Certified Interventional Pain Specialist with extensive familiarity and expertise in the same exact procedure and technique.

## 2020-04-06 ENCOUNTER — Ambulatory Visit
Payer: Medicare Other | Attending: Student in an Organized Health Care Education/Training Program | Admitting: Student in an Organized Health Care Education/Training Program

## 2020-04-06 ENCOUNTER — Other Ambulatory Visit: Payer: Self-pay

## 2020-04-06 ENCOUNTER — Telehealth: Payer: Self-pay | Admitting: Student in an Organized Health Care Education/Training Program

## 2020-04-06 ENCOUNTER — Encounter: Payer: Self-pay | Admitting: Student in an Organized Health Care Education/Training Program

## 2020-04-06 DIAGNOSIS — G894 Chronic pain syndrome: Secondary | ICD-10-CM

## 2020-04-06 DIAGNOSIS — M19011 Primary osteoarthritis, right shoulder: Secondary | ICD-10-CM | POA: Diagnosis not present

## 2020-04-06 DIAGNOSIS — M25511 Pain in right shoulder: Secondary | ICD-10-CM

## 2020-04-06 DIAGNOSIS — M48062 Spinal stenosis, lumbar region with neurogenic claudication: Secondary | ICD-10-CM

## 2020-04-06 DIAGNOSIS — M25512 Pain in left shoulder: Secondary | ICD-10-CM

## 2020-04-06 DIAGNOSIS — M5416 Radiculopathy, lumbar region: Secondary | ICD-10-CM

## 2020-04-06 DIAGNOSIS — G8929 Other chronic pain: Secondary | ICD-10-CM

## 2020-04-06 DIAGNOSIS — Z8739 Personal history of other diseases of the musculoskeletal system and connective tissue: Secondary | ICD-10-CM

## 2020-04-06 DIAGNOSIS — M19012 Primary osteoarthritis, left shoulder: Secondary | ICD-10-CM

## 2020-04-06 NOTE — Telephone Encounter (Signed)
Has VV today. Spoke with Eunice Blase (daughter), she will discuss with Dr. Cherylann Ratel at [facility].

## 2020-04-06 NOTE — Progress Notes (Signed)
Patient: Loretta Webb  Service Category: E/M  Provider: Gillis Santa, MD  DOB: Apr 02, 1932  DOS: 04/06/2020  Location: Office  MRN: [id number]  Setting: Ambulatory outpatient  Referring Provider: Rusty Aus, MD  Type: Established Patient  Specialty: Interventional Pain Management  PCP: Rusty Aus, MD  Location: Home  Delivery: TeleHealth     Virtual Encounter - Pain Management PROVIDER NOTE: Information contained herein reflects review and annotations entered in association with encounter. Interpretation of such information and data should be left to medically-trained personnel. Information provided to patient can be located elsewhere in the medical record under "Patient Instructions". Document created using STT-dictation technology, any transcriptional errors that may result from process are unintentional.    Contact & Pharmacy Preferred: [phone] Home: [phone] (home) Mobile: There is no such number on file (mobile). E-mail: [email or web address] - [place], [place] [address] Phone: [phone] Fax: [phone]   Pre-screening  Loretta Webb offered "in-person" vs "virtual" encounter. She indicated preferring virtual for this encounter.   Reason COVID-19*  Social distancing based on CDC and AMA recommendations.   I contacted Loretta Webb on 04/06/2020 via telephone.      I clearly identified myself as Gillis Santa, MD. I verified that I was speaking with the correct person using two identifiers (Name: Loretta Webb, and date of birth: 12/03/1931).  Consent I sought verbal advanced consent from Loretta Webb for virtual visit interactions. I informed Loretta Webb of possible security and privacy concerns, risks, and limitations associated with providing "not-in-person" medical evaluation and management services. I also informed Loretta Webb of the availability of "in-person" appointments.  Finally, I informed her that there would be a charge for the virtual visit and that she could be  personally, fully or partially, financially responsible for it. Ms. Karpel expressed understanding and agreed to proceed.   Historic Elements   Loretta Webb is a 86 y.o. year old, female patient evaluated today after our last contact on 04/06/2020. Loretta Webb  has a past medical history of Arthritis, Chronic fatigue, DM2 (diabetes mellitus, type 2) ([place]), Esophageal stricture, Fibromyalgia, GERD (gastroesophageal reflux disease), Hypertension, Lumbar stenosis, Osteoporosis, and Sleep apnea. She also  has a past surgical history that includes knee replacements; Abdominal hysterectomy; Rotator cuff repair; and Appendectomy. Loretta Webb has a current medication list which includes the following prescription(s): acetaminophen, alprazolam, aspirin, aspirin, bumetanide, cholecalciferol, vitamin b-12, diclofenac sodium, diltiazem, donepezil, duloxetine, hydrocodone-acetaminophen, losartan, magnesium, metformin, pantoprazole, propranolol, trazodone, apixaban, digoxin, and pravastatin. She  reports that she has never smoked. She has never used smokeless tobacco. She reports that she does not drink alcohol and does not use drugs. Loretta Webb has No Known Allergies.   HPI  Today, she is being contacted for a post-procedure assessment.   Post-Procedure Evaluation  Procedure (04/06/2020):   Type: Therapeutic Suprascapular nerve Block #2  Primary Purpose: Therapeutic Region: Posterior Shoulder & Scapular Areas Level: Superior to the scapular spine, in the lateral aspect of the supraspinatus fossa (Suprascapular notch). Target Area: Suprascapular nerve as it passes thru the lower portion of the suprascapular notch. Approach: Posterior percutaneous approach. Laterality: Left-Side  Sedation: Please see nurses note.  Effectiveness during initial hour after procedure(Ultra-Short Term Relief):  (unknown)  Local  anesthetic used: Long-acting (4-6 hours) Effectiveness: Defined as any analgesic benefit obtained secondary to the administration of local anesthetics. This carries significant diagnostic value as to the etiological location,  or anatomical origin, of the pain. Duration of benefit is expected to coincide with the duration of the local anesthetic used.  Effectiveness during initial 4-6 hours after procedure(Short-Term Relief):  (unknown)   Long-term benefit: Defined as any relief past the pharmacologic duration of the local anesthetics.  Effectiveness past the initial 6 hours after procedure(Long-Term Relief): 100 %   Current benefits: Defined as benefit that persist at this time.   Analgesia:  90-100% better Function: Loretta Webb reports improvement in function ROM: Loretta Webb reports improvement in ROM   Laboratory Chemistry Profile   Renal Lab Results  Component Value Date   BUN 14 09/03/2019   CREATININE 1.44 (H) 09/03/2019   BCR 10 (L) 09/03/2019   GFRAA 38 (L) 09/03/2019   GFRNONAA 33 (L) 09/03/2019     Hepatic Lab Results  Component Value Date   AST 12 09/03/2019   ALT 10 (L) 11/25/2016   ALBUMIN 4.3 09/03/2019   ALKPHOS 50 09/03/2019   LIPASE 42 09/11/2015     Electrolytes Lab Results  Component Value Date   NA 140 09/03/2019   K 4.1 09/03/2019   CL 104 09/03/2019   CALCIUM 9.8 09/03/2019   MG 2.0 09/03/2019     Bone Lab Results  Component Value Date   25OHVITD1 39 09/03/2019   25OHVITD2 3.4 09/03/2019   25OHVITD3 36 09/03/2019     Inflammation (CRP: Acute Phase) (ESR: Chronic Phase) Lab Results  Component Value Date   CRP <1 09/03/2019   ESRSEDRATE 17 09/03/2019       Note: Above Lab results reviewed.   Assessment  The primary encounter diagnosis was Chronic shoulder pain (3ry area of Pain) (Bilateral) (L>R). Diagnoses of Localized osteoarthritis of shoulder regions, bilateral, History of rotator cuff syndrome (bilateral), Chronic pain syndrome,  Lumbar stenosis with neurogenic claudication, and Chronic radicular lumbar pain were also pertinent to this visit.  Plan of Care  Loretta Webb has a current medication list which includes the following long-term medication(s): bumetanide, diltiazem, donepezil, duloxetine, losartan, metformin, pantoprazole, propranolol, trazodone, apixaban, digoxin, and pravastatin.   Post procedure assessment after left suprascapular nerve block #2.  Patient endorses greater than 90% pain relief in her left shoulder and left bicep related pain.  She has improved range of motion.  Per the patient's daughter, Loretta Webb is having more pain on her right shoulder now similar to her left prior to the block.  She is interested in proceeding with a right suprascapular nerve block to help target her right shoulder pain.  Patient is also experiencing low back pain that radiates into her right lower extremity in a dermatomal fashion.  She has previously had lumbar epidural steroid injections in the past with Dr. Chasnis which did provide symptomatic pain relief.  This is been more than 6 months ago.  We will plan on doing a right lumbar epidural steroid injection or a caudal ESI on the same day to help target her right radicular pain flare.  Risks and benefits were reviewed and patient would like to proceed.  Plan: -Successful pain relief after left suprascapular nerve block, repeat as needed -Plan for right suprascapular nerve block under fluoroscopy. -Plan for right lumbar epidural steroid injection for lumbar radicular pain   Orders:  Orders Placed This Encounter  Procedures  . SUPRASCAPULAR NERVE BLOCK    For shoulder pain.    Standing Status:   Future    Standing Expiration Date:   07/06/2020    Scheduling Instructions:       Purpose: Diagnostic     Laterality: RIGHT     Level(s): Suprascapular notch     Scheduling Timeframe: As permitted by the schedule    Order Specific Question:   Where will this  procedure be performed?    Answer:   [facility]  . Caudal Epidural Injection    Standing Status:   Future    Standing Expiration Date:   05/06/2020    Scheduling Instructions:     Laterality: RIGHT     Level(s): Sacrococcygeal canal (Tailbone area)     Sedation: without     Scheduling Timeframe: As soon as pre-approved    Order Specific Question:   Where will this procedure be performed?    Answer:   [facility]   Follow-up plan:   Return for Right SSNB + Right Caudal w/o sedation.     Status post bilateral suprascapular nerve block 01/22/2020, left 03/23/2020.  Consider pulsed RFA. Status post bilateral intra-articular hip steroid injection 03/02/2020, 100% pain relief, repeat as needed.    Recent Visits Date Type Provider Dept  03/23/20 Office Visit , , MD [facility]  03/02/20 Procedure visit , , MD [facility]  02/24/20 Telemedicine , , MD [facility]  01/22/20 Procedure visit , , MD [facility]  01/20/20 Office Visit , , MD [facility]  Showing recent visits within past 90 days and meeting all other requirements Today's Visits Date Type Provider Dept  04/06/20 Office Visit , , MD [facility]  Showing today's visits and meeting all other requirements Future Appointments Date Type Provider Dept  04/21/20 Appointment Naveira, Francisco, MD [facility]  Showing future appointments within next 90 days and meeting all other requirements  I discussed the assessment and treatment plan with the patient. The patient was provided an opportunity to ask questions and all were answered. The patient agreed with the plan and demonstrated an understanding of the instructions.  Patient advised to call back or seek an in-person evaluation if the symptoms or condition worsens.  Duration of encounter:30minutes.  Note by:  ,  MD Date: 04/06/2020; Time: 3:07 PM 

## 2020-04-06 NOTE — Telephone Encounter (Signed)
Having a lot of pain on right side hip down leg and also right shoulder  Wants to know how soon she can get injections there.

## 2020-04-19 NOTE — Progress Notes (Signed)
I attempted to call the patient however no response.  -Dr Lateef  

## 2020-04-20 ENCOUNTER — Telehealth: Payer: Self-pay

## 2020-04-20 ENCOUNTER — Encounter: Payer: Self-pay | Admitting: Pain Medicine

## 2020-04-20 NOTE — Telephone Encounter (Signed)
LM for patient to call office to go over pre virtual appointment questions.  

## 2020-04-21 ENCOUNTER — Ambulatory Visit
Payer: Medicare Other | Attending: Pain Medicine | Admitting: Student in an Organized Health Care Education/Training Program

## 2020-04-21 ENCOUNTER — Other Ambulatory Visit: Payer: Self-pay

## 2020-04-21 DIAGNOSIS — M25511 Pain in right shoulder: Secondary | ICD-10-CM

## 2020-04-21 DIAGNOSIS — M25512 Pain in left shoulder: Secondary | ICD-10-CM

## 2020-04-21 DIAGNOSIS — G8929 Other chronic pain: Secondary | ICD-10-CM

## 2020-04-27 ENCOUNTER — Other Ambulatory Visit: Payer: Self-pay

## 2020-04-27 ENCOUNTER — Ambulatory Visit (HOSPITAL_BASED_OUTPATIENT_CLINIC_OR_DEPARTMENT_OTHER): Payer: Medicare Other | Admitting: Student in an Organized Health Care Education/Training Program

## 2020-04-27 ENCOUNTER — Encounter: Payer: Self-pay | Admitting: Student in an Organized Health Care Education/Training Program

## 2020-04-27 ENCOUNTER — Ambulatory Visit
Admission: RE | Admit: 2020-04-27 | Discharge: 2020-04-27 | Disposition: A | Discharge status: Home / Self Care | Payer: Medicare Other | Source: Ambulatory Visit | Attending: Student in an Organized Health Care Education/Training Program | Admitting: Student in an Organized Health Care Education/Training Program

## 2020-04-27 VITALS — BP 152/101 | HR 97 | Temp 97.3°F | Resp 20 | Ht 64.0 in | Wt 145.0 lb

## 2020-04-27 DIAGNOSIS — M19011 Primary osteoarthritis, right shoulder: Secondary | ICD-10-CM

## 2020-04-27 DIAGNOSIS — M48062 Spinal stenosis, lumbar region with neurogenic claudication: Secondary | ICD-10-CM | POA: Insufficient documentation

## 2020-04-27 DIAGNOSIS — M25511 Pain in right shoulder: Secondary | ICD-10-CM

## 2020-04-27 DIAGNOSIS — G894 Chronic pain syndrome: Secondary | ICD-10-CM

## 2020-04-27 DIAGNOSIS — M5416 Radiculopathy, lumbar region: Secondary | ICD-10-CM | POA: Diagnosis present

## 2020-04-27 DIAGNOSIS — M19012 Primary osteoarthritis, left shoulder: Secondary | ICD-10-CM | POA: Diagnosis present

## 2020-04-27 DIAGNOSIS — M25512 Pain in left shoulder: Secondary | ICD-10-CM | POA: Insufficient documentation

## 2020-04-27 DIAGNOSIS — Z8739 Personal history of other diseases of the musculoskeletal system and connective tissue: Secondary | ICD-10-CM | POA: Insufficient documentation

## 2020-04-27 DIAGNOSIS — G8929 Other chronic pain: Secondary | ICD-10-CM | POA: Diagnosis present

## 2020-04-27 MED ORDER — ROPIVACAINE HCL 2 MG/ML IJ SOLN
INTRAMUSCULAR | Status: AC
  Filled 2020-04-27: qty 10

## 2020-04-27 MED ORDER — IOHEXOL 180 MG/ML  SOLN
10.0000 mL | Freq: Once | INTRAMUSCULAR | Status: AC
  Administered 2020-04-27: 10 mL via EPIDURAL

## 2020-04-27 MED ORDER — METHYLPREDNISOLONE ACETATE 40 MG/ML IJ SUSP
40.0000 mg | Freq: Once | INTRAMUSCULAR | Status: AC
  Administered 2020-04-27: 40 mg via INTRA_ARTICULAR

## 2020-04-27 MED ORDER — SODIUM CHLORIDE 0.9% FLUSH
2.0000 mL | Freq: Once | INTRAVENOUS | Status: AC
  Administered 2020-04-27: 10 mL

## 2020-04-27 MED ORDER — SODIUM CHLORIDE (PF) 0.9 % IJ SOLN
INTRAMUSCULAR | Status: AC
  Filled 2020-04-27: qty 10

## 2020-04-27 MED ORDER — ROPIVACAINE HCL 2 MG/ML IJ SOLN
4.0000 mL | Freq: Once | INTRAMUSCULAR | Status: AC
  Administered 2020-04-27: 10 mL via PERINEURAL

## 2020-04-27 MED ORDER — IOHEXOL 180 MG/ML  SOLN
INTRAMUSCULAR | Status: AC
  Filled 2020-04-27: qty 20

## 2020-04-27 MED ORDER — DEXAMETHASONE SODIUM PHOSPHATE 10 MG/ML IJ SOLN
INTRAMUSCULAR | Status: AC
  Filled 2020-04-27: qty 1

## 2020-04-27 MED ORDER — ROPIVACAINE HCL 2 MG/ML IJ SOLN
2.0000 mL | Freq: Once | INTRAMUSCULAR | Status: AC
  Administered 2020-04-27: 10 mL via EPIDURAL

## 2020-04-27 MED ORDER — METHYLPREDNISOLONE ACETATE 40 MG/ML IJ SUSP
INTRAMUSCULAR | Status: AC
  Filled 2020-04-27: qty 1

## 2020-04-27 MED ORDER — LIDOCAINE HCL 2 % IJ SOLN
INTRAMUSCULAR | Status: AC
  Filled 2020-04-27: qty 20

## 2020-04-27 MED ORDER — DEXAMETHASONE SODIUM PHOSPHATE 10 MG/ML IJ SOLN
10.0000 mg | Freq: Once | INTRAMUSCULAR | Status: AC
  Administered 2020-04-27: 10 mg

## 2020-04-27 MED ORDER — LIDOCAINE HCL 2 % IJ SOLN
20.0000 mL | Freq: Once | INTRAMUSCULAR | Status: AC
  Administered 2020-04-27: 400 mg

## 2020-04-27 NOTE — Patient Instructions (Signed)

## 2020-04-27 NOTE — Progress Notes (Signed)
PROVIDER NOTE: Information contained herein reflects review and annotations entered in association with encounter. Interpretation of such information and data should be left to medically-trained personnel. Information provided to patient can be located elsewhere in the medical record under "Patient Instructions". Document created using STT-dictation technology, any transcriptional errors that may result from process are unintentional.    Patient: Loretta Webb  Service Category: Procedure  Provider: Edward Jolly, MD  DOB: [birth year]  DOS: 04/27/2020  Location: [facility]  MRN: [id number]  Setting: Ambulatory - outpatient  Referring Provider: [name], MD  Type: Established Patient  Specialty: Interventional Pain Management  PCP: [name], MD   Primary Reason for Visit: Interventional Pain Management Treatment. CC: Back Pain (Lumbar right )  Procedure:          Anesthesia, Analgesia, Anxiolysis:  Type: Diagnostic Epidural Steroid Injection #1  Region: Caudal Level: Sacrococcygeal   Laterality: Midline aiming at the right  Type: Local Anesthesia  Local Anesthetic: Lidocaine 1-2%  Position: Prone   Indications: Lumbar spinal stenosis, chronic lumbar radicular pain  Pain Score: Pre-procedure: 10-Worst pain ever (left shoulder otherwise pain score is 1)/10 Post-procedure: 0 r(ight shoulder and right leg) /10   Pre-op H&P Assessment:  Loretta Webb is a 86 y.o. (year old), female patient, seen today for interventional treatment. She  has a past surgical history that includes knee replacements; Abdominal hysterectomy; Rotator cuff repair; and Appendectomy. Loretta Webb has a current medication list which includes the following prescription(s): acetaminophen, alprazolam, aspirin, bumetanide, diclofenac sodium, donepezil, duloxetine, hydrocodone-acetaminophen, metformin, pantoprazole, trazodone, apixaban, aspirin, cholecalciferol, vitamin b-12, digoxin, diltiazem,  losartan, magnesium, pravastatin, and propranolol. Her primarily concern today is the Back Pain (Lumbar right )  Initial Vital Signs:  Pulse/HCG Rate: 97ECG Heart Rate: (!) 144 Temp: (!) 97.3 F (36.3 C) Resp: 16 BP: (!) 130/111 SpO2: 97 %  BMI: Estimated body mass index is 24.89 kg/m as calculated from the following:   Height as of this encounter: 5\' 4"  (1.626 m).   Weight as of this encounter: 145 lb (65.8 kg).  Risk Assessment: Allergies: Reviewed. She has No Known Allergies.  Allergy Precautions: None required Coagulopathies: Reviewed. None identified.  Blood-thinner therapy: None at this time Active Infection(s): Reviewed. None identified. Loretta Webb is afebrile  Site Confirmation: Loretta Webb was asked to confirm the procedure and laterality before marking the site Procedure checklist: Completed Consent: Before the procedure and under the influence of no sedative(s), amnesic(s), or anxiolytics, the patient was informed of the treatment options, risks and possible complications. To fulfill our ethical and legal obligations, as recommended by the American Medical Association's Code of Ethics, I have informed the patient of my clinical impression; the nature and purpose of the treatment or procedure; the risks, benefits, and possible complications of the intervention; the alternatives, including doing nothing; the risk(s) and benefit(s) of the alternative treatment(s) or procedure(s); and the risk(s) and benefit(s) of doing nothing. The patient was provided information about the general risks and possible complications associated with the procedure. These may include, but are not limited to: failure to achieve desired goals, infection, bleeding, organ or nerve damage, allergic reactions, paralysis, and death. In addition, the patient was informed of those risks and complications associated to Spine-related procedures, such as failure to decrease pain; infection (i.e.: Meningitis, epidural  or intraspinal abscess); bleeding (i.e.: epidural hematoma, subarachnoid hemorrhage, or any other type of intraspinal or peri-dural bleeding); organ or nerve damage (i.e.: Any type of peripheral nerve, nerve  root, or spinal cord injury) with subsequent damage to sensory, motor, and/or autonomic systems, resulting in permanent pain, numbness, and/or weakness of one or several areas of the body; allergic reactions; (i.e.: anaphylactic reaction); and/or death. Furthermore, the patient was informed of those risks and complications associated with the medications. These include, but are not limited to: allergic reactions (i.e.: anaphylactic or anaphylactoid reaction(s)); adrenal axis suppression; blood sugar elevation that in diabetics may result in ketoacidosis or comma; water retention that in patients with history of congestive heart failure may result in shortness of breath, pulmonary edema, and decompensation with resultant heart failure; weight gain; swelling or edema; medication-induced neural toxicity; particulate matter embolism and blood vessel occlusion with resultant organ, and/or nervous system infarction; and/or aseptic necrosis of one or more joints. Finally, the patient was informed that Medicine is not an exact science; therefore, there is also the possibility of unforeseen or unpredictable risks and/or possible complications that may result in a catastrophic outcome. The patient indicated having understood very clearly. We have given the patient no guarantees and we have made no promises. Enough time was given to the patient to ask questions, all of which were answered to the patient's satisfaction. Loretta Webb has indicated that she wanted to continue with the procedure. Attestation: I, the ordering provider, attest that I have discussed with the patient the benefits, risks, side-effects, alternatives, likelihood of achieving goals, and potential problems during recovery for the procedure that I have  provided informed consent. Date  Time: 04/27/2020 10:53 AM  Pre-Procedure Preparation:  Monitoring: As per clinic protocol. Respiration, ETCO2, SpO2, BP, heart rate and rhythm monitor placed and checked for adequate function Safety Precautions: Patient was assessed for positional comfort and pressure points before starting the procedure. Time-out: I initiated and conducted the "Time-out" before starting the procedure, as per protocol. The patient was asked to participate by confirming the accuracy of the "Time Out" information. Verification of the correct person, site, and procedure were performed and confirmed by me, the nursing staff, and the patient. "Time-out" conducted as per Joint Commission's Universal Protocol (UP.01.01.01). Time: 1131  Description of Procedure:          Target Area: Caudal Epidural Canal. Approach: Midline approach. Area Prepped: Entire Posterior Sacrococcygeal Region DuraPrep (Iodine Povacrylex [0.7% available iodine] and Isopropyl Alcohol, 74% w/w) Safety Precautions: Aspiration looking for blood return was conducted prior to all injections. At no point did we inject any substances, as a needle was being advanced. No attempts were made at seeking any paresthesias. Safe injection practices and needle disposal techniques used. Medications properly checked for expiration dates. SDV (single dose vial) medications used. Description of the Procedure: Protocol guidelines were followed. The patient was placed in position over the fluoroscopy table. The target area was identified and the area prepped in the usual manner. Skin & deeper tissues infiltrated with local anesthetic. Appropriate amount of time allowed to pass for local anesthetics to take effect. The procedure needles were then advanced to the target area. Proper needle placement secured. Negative aspiration confirmed. Solution injected in intermittent fashion, asking for systemic symptoms every 0.5cc of injectate. The  needles were then removed and the area cleansed, making sure to leave some of the prepping solution back to take advantage of its long term bactericidal properties. Vitals:   04/27/20 1101 04/27/20 1134 04/27/20 1139 04/27/20 1143  BP: (!) 130/111 (!) 149/101 (!) 152/101 (!) (P) 151/113  Pulse: 97     Resp: 16 20 20  (P) 20  Temp: (!) 97.3 F (36.3 C)     TempSrc: Temporal     SpO2: 97% 96% 95% (P) 97%  Weight: 145 lb (65.8 kg)     Height: 5\' 4"  (1.626 m)       Start Time: 1131 hrs. End Time: 1138 hrs. Materials:  Needle(s) Type: Epidural needle Gauge: 22G Length: 3.5-in Medication(s): Please see orders for medications and dosing details. 5 cc solution made of 2 cc of preservative-free saline, 2 cc of 0.2% ropivacaine, 1 cc of Decadron 10 mg/cc.  Imaging Guidance (Spinal):          Type of Imaging Technique: Fluoroscopy Guidance (Spinal) Indication(s): Assistance in needle guidance and placement for procedures requiring needle placement in or near specific anatomical locations not easily accessible without such assistance. Exposure Time: Please see nurses notes. Contrast: Before injecting any contrast, we confirmed that the patient did not have an allergy to iodine, shellfish, or radiological contrast. Once satisfactory needle placement was completed at the desired level, radiological contrast was injected. Contrast injected under live fluoroscopy. No contrast complications. See chart for type and volume of contrast used. Fluoroscopic Guidance: I was personally present during the use of fluoroscopy. "Tunnel Vision Technique" used to obtain the best possible view of the target area. Parallax error corrected before commencing the procedure. "Direction-depth-direction" technique used to introduce the needle under continuous pulsed fluoroscopy. Once target was reached, antero-posterior, oblique, and lateral fluoroscopic projection used confirm needle placement in all planes. Images permanently  stored in EMR. Interpretation: I personally interpreted the imaging intraoperatively. Adequate needle placement confirmed in multiple planes. Appropriate spread of contrast into desired area was observed. No evidence of afferent or efferent intravascular uptake. No intrathecal or subarachnoid spread observed. Permanent images saved into the patient's record.  Antibiotic Prophylaxis:   Anti-infectives (From admission, onward)   None     Indication(s): None identified  Post-operative Assessment:  Post-procedure Vital Signs:  Pulse/HCG Rate: 97(!) (P) 126 Temp: (!) 97.3 F (36.3 C) Resp: (P) 20 BP: (!) (P) 151/113 (Dr. Cherylann RatelLateef aware of consistene elevated BP and rapid A. fib.) SpO2: (P) 97 %  EBL: None  Complications: No immediate post-treatment complications observed by team, or reported by patient.  Note: The patient tolerated the entire procedure well. A repeat set of vitals were taken after the procedure and the patient was kept under observation following institutional policy, for this type of procedure. Post-procedural neurological assessment was performed, showing return to baseline, prior to discharge. The patient was provided with post-procedure discharge instructions, including a section on how to identify potential problems. Should any problems arise concerning this procedure, the patient was given instructions to immediately contact us, at any time, without hesitation. In any case, we plan to contact the patient by telephone for a follow-up status report regarding this interventional procedure.  Comments:  No additional relevant information.  Plan of Care  Orders:  Orders Placed This Encounter  Procedures  . DG PAIN CLINIC C-ARM 1-60 MIN NO REPORT    Intraoperative interpretation [facility] procedural [facility] at [facility].    Standing Status:   Standing    Number of Occurrences:   1    Order Specific Question:   Reason for exam:    Answer:   Assistance in needle  guidance and placement for procedures requiring needle placement in or near specific anatomical locations not easily accessible without such assistance.    Medications ordered for procedure: Meds ordered this encounter  Medications  . iohexol (OMNIPAQUE)  180 MG/ML injection 10 mL    Must be Myelogram-compatible. If not available, you may substitute with a water-soluble, non-ionic, hypoallergenic, myelogram-compatible radiological contrast medium.  Marland Kitchen lidocaine (XYLOCAINE) 2 % (with pres) injection 400 mg  . ropivacaine (PF) 2 mg/mL (0.2%) (NAROPIN) injection 2 mL  . sodium chloride flush (NS) 0.9 % injection 2 mL  . dexamethasone (DECADRON) injection 10 mg  . ropivacaine (PF) 2 mg/mL (0.2%) (NAROPIN) injection 4 mL  . methylPREDNISolone acetate (DEPO-MEDROL) injection 40 mg   Medications administered: We administered iohexol, lidocaine, ropivacaine (PF) 2 mg/mL (0.2%), sodium chloride flush, dexamethasone, ropivacaine (PF) 2 mg/mL (0.2%), and methylPREDNISolone acetate.  See the medical record for exact dosing, route, and time of administration.  Follow-up plan:   Return in about 6 weeks (around 06/08/2020) for Post Procedure Evaluation, virtual.      Status post bilateral suprascapular nerve block 01/22/2020, left 03/23/2020.  Consider pulsed RFA. Status post bilateral intra-articular hip steroid injection 03/02/2020, 100% pain relief, repeat as needed. Caudal 04/27/20, Right SSNB 04/27/20     Recent Visits Date Type Provider Dept  04/06/20 Office Visit Edward Jolly, MD [facility]  03/23/20 Office Visit Edward Jolly, MD [facility]  03/02/20 Procedure visit Edward Jolly, MD [facility]  02/24/20 Telemedicine Edward Jolly, MD [facility]  Showing recent visits within past 90 days and meeting all other requirements Today's Visits Date Type Provider Dept  04/27/20 Procedure visit Edward Jolly, MD [facility]  Showing today's visits  and meeting all other requirements Future Appointments Date Type Provider Dept  06/04/20 Appointment Edward Jolly, MD [facility]  Showing future appointments within next 90 days and meeting all other requirements  Disposition: Discharge home  Discharge (Date  Time): 04/27/2020;   hrs.   Primary Care Physician: [name], MD Location: [facility] Note by: Edward Jolly, MD Date: 04/27/2020; Time: 1:12 PM  Disclaimer:  Medicine is not an exact science. The only guarantee in medicine is that nothing is guaranteed. It is important to note that the decision to proceed with this intervention was based on the information collected from the patient. The Data and conclusions were drawn from the patient's questionnaire, the interview, and the physical examination. Because the information was provided in large part by the patient, it cannot be guaranteed that it has not been purposely or unconsciously manipulated. Every effort has been made to obtain as much relevant data as possible for this evaluation. It is important to note that the conclusions that lead to this procedure are derived in large part from the available data. Always take into account that the treatment will also be dependent on availability of resources and existing treatment guidelines, considered by other Pain Management Practitioners as being common knowledge and practice, at the time of the intervention. For Medico-Legal purposes, it is also important to point out that variation in procedural techniques and pharmacological choices are the acceptable norm. The indications, contraindications, technique, and results of the above procedure should only be interpreted and judged by a Board-Certified Interventional Pain Specialist with extensive familiarity and expertise in the same exact procedure and technique.

## 2020-04-27 NOTE — Progress Notes (Signed)
Safety precautions to be maintained throughout the outpatient stay will include: orient to surroundings, keep bed in low position, maintain call bell within reach at all times, provide assistance with transfer out of bed and ambulation.  

## 2020-04-27 NOTE — Progress Notes (Signed)
PROVIDER NOTE: Information contained herein reflects review and annotations entered in association with encounter. Interpretation of such information and data should be left to medically-trained personnel. Information provided to patient can be located elsewhere in the medical record under "Patient Instructions". Document created using STT-dictation technology, any transcriptional errors that may result from process are unintentional.    Patient: Loretta Webb  Service Category: Procedure  Provider: Edward Jolly, MD  DOB: Nov 08, 1931  DOS: 04/27/2020  Location: [facility]  MRN: [id number]  Setting: Ambulatory - outpatient  Referring Provider: [name], MD  Type: Established Patient  Specialty: Interventional Pain Management  PCP: [name], MD   Primary Reason for Visit: Interventional Pain Management Treatment. CC: Back Pain (Lumbar right )  Procedure:          Anesthesia, Analgesia, Anxiolysis:  Type: Therapeutic Suprascapular nerve Block  Primary Purpose: Diagnostic/Therapeutric Region: Posterior Shoulder & Scapular Areas Level: Superior to the scapular spine, in the lateral aspect of the supraspinatus fossa (Suprascapular notch). Target Area: Suprascapular nerve as it passes thru the lower portion of the suprascapular notch. Approach: Posterior percutaneous approach. Laterality: Right-Side  Type: Local Anesthesia  Local Anesthetic: Lidocaine 1-2%  Position: Prone   Indications: 1. Chronic shoulder pain (3ry area of Pain) (Bilateral) (L>R)   2. Localized osteoarthritis of shoulder regions, bilateral   3. History of rotator cuff syndrome (bilateral)   4. Lumbar stenosis with neurogenic claudication   5. Chronic radicular lumbar pain   6. Chronic pain syndrome    Pain Score: Pre-procedure: 10-Worst pain ever (left shoulder otherwise pain score is 1)/10 Post-procedure: 0 shoulder/10   Pre-op Assessment:  Loretta Webb is a 86 y.o. (year old), female  patient, seen today for interventional treatment. She  has a past surgical history that includes knee replacements; Abdominal hysterectomy; Rotator cuff repair; and Appendectomy. Loretta Webb has a current medication list which includes the following prescription(s): acetaminophen, alprazolam, aspirin, bumetanide, diclofenac sodium, donepezil, duloxetine, hydrocodone-acetaminophen, metformin, pantoprazole, trazodone, apixaban, aspirin, cholecalciferol, vitamin b-12, digoxin, diltiazem, losartan, magnesium, pravastatin, and propranolol. Her primarily concern today is the Back Pain (Lumbar right )  Initial Vital Signs:  Pulse/HCG Rate: 97ECG Heart Rate: (!) 144 Temp: (!) 97.3 F (36.3 C) Resp: 16 BP: (!) 130/111 SpO2: 97 %  BMI: Estimated body mass index is 24.89 kg/m as calculated from the following:   Height as of this encounter: 5\' 4"  (1.626 m).   Weight as of this encounter: 145 lb (65.8 kg).  Risk Assessment: Allergies: Reviewed. She has No Known Allergies.  Allergy Precautions: None required Coagulopathies: Reviewed. None identified.  Blood-thinner therapy: None at this time Active Infection(s): Reviewed. None identified. Loretta Webb is afebrile  Site Confirmation: Loretta Webb was asked to confirm the procedure and laterality before marking the site Procedure checklist: Completed Consent: Before the procedure and under the influence of no sedative(s), amnesic(s), or anxiolytics, the patient was informed of the treatment options, risks and possible complications. To fulfill our ethical and legal obligations, as recommended by the American Medical Association's Code of Ethics, I have informed the patient of my clinical impression; the nature and purpose of the treatment or procedure; the risks, benefits, and possible complications of the intervention; the alternatives, including doing nothing; the risk(s) and benefit(s) of the alternative treatment(s) or procedure(s); and the risk(s) and  benefit(s) of doing nothing. The patient was provided information about the general risks and possible complications associated with the procedure. These may include, but are not limited  to: failure to achieve desired goals, infection, bleeding, organ or nerve damage, allergic reactions, paralysis, and death. In addition, the patient was informed of those risks and complications associated to the procedure, such as failure to decrease pain; infection; bleeding; organ or nerve damage with subsequent damage to sensory, motor, and/or autonomic systems, resulting in permanent pain, numbness, and/or weakness of one or several areas of the body; allergic reactions; (i.e.: anaphylactic reaction); and/or death. Furthermore, the patient was informed of those risks and complications associated with the medications. These include, but are not limited to: allergic reactions (i.e.: anaphylactic or anaphylactoid reaction(s)); adrenal axis suppression; blood sugar elevation that in diabetics may result in ketoacidosis or comma; water retention that in patients with history of congestive heart failure may result in shortness of breath, pulmonary edema, and decompensation with resultant heart failure; weight gain; swelling or edema; medication-induced neural toxicity; particulate matter embolism and blood vessel occlusion with resultant organ, and/or nervous system infarction; and/or aseptic necrosis of one or more joints. Finally, the patient was informed that Medicine is not an exact science; therefore, there is also the possibility of unforeseen or unpredictable risks and/or possible complications that may result in a catastrophic outcome. The patient indicated having understood very clearly. We have given the patient no guarantees and we have made no promises. Enough time was given to the patient to ask questions, all of which were answered to the patient's satisfaction. Loretta Webb has indicated that she wanted to continue  with the procedure. Attestation: I, the ordering provider, attest that I have discussed with the patient the benefits, risks, side-effects, alternatives, likelihood of achieving goals, and potential problems during recovery for the procedure that I have provided informed consent. Date  Time: 04/27/2020 10:53 AM  Pre-Procedure Preparation:  Monitoring: As per clinic protocol. Respiration, ETCO2, SpO2, BP, heart rate and rhythm monitor placed and checked for adequate function Safety Precautions: Patient was assessed for positional comfort and pressure points before starting the procedure. Time-out: I initiated and conducted the "Time-out" before starting the procedure, as per protocol. The patient was asked to participate by confirming the accuracy of the "Time Out" information. Verification of the correct person, site, and procedure were performed and confirmed by me, the nursing staff, and the patient. "Time-out" conducted as per Joint Commission's Universal Protocol (UP.01.01.01). Time: 1131  Description of Procedure:          Area Prepped: Entire shoulder Area DuraPrep (Iodine Povacrylex [0.7% available iodine] and Isopropyl Alcohol, 74% w/w) Safety Precautions: Aspiration looking for blood return was conducted prior to all injections. At no point did we inject any substances, as a needle was being advanced. No attempts were made at seeking any paresthesias. Safe injection practices and needle disposal techniques used. Medications properly checked for expiration dates. SDV (single dose vial) medications used. Description of the Procedure: Protocol guidelines were followed. The patient was placed in position over the procedure table. The target area was identified and the area prepped in the usual manner. Skin & deeper tissues infiltrated with local anesthetic. Appropriate amount of time allowed to pass for local anesthetics to take effect. The procedure needles were then advanced to the target area.  Proper needle placement secured. Negative aspiration confirmed. Solution injected in intermittent fashion, asking for systemic symptoms every 0.5cc of injectate. The needles were then removed and the area cleansed, making sure to leave some of the prepping solution back to take advantage of its long term bactericidal properties.  Vitals:   04/27/20 1101  04/27/20 1134 04/27/20 1139 04/27/20 1143  BP: (!) 130/111 (!) 149/101 (!) 152/101 (!) (P) 151/113  Pulse: 97     Resp: 16 20 20  (P) 20  Temp: (!) 97.3 F (36.3 C)     TempSrc: Temporal     SpO2: 97% 96% 95% (P) 97%  Weight: 145 lb (65.8 kg)     Height: 5\' 4"  (1.626 m)       Start Time: 1131 hrs. End Time: 1138 hrs. Materials:  Needle(s) Type: Spinal Needle Gauge: 25G Length: 3.5-in Medication(s): Please see orders for medications and dosing details. 5 cc solution made of 4 cc of 0.2% ropivacaine, 1 cc of methylprednisolone, 40 mg/cc.  Injected for the right suprascapular nerve  Imaging Guidance (Non-Spinal):          Type of Imaging Technique: Fluoroscopy Guidance (Non-Spinal) and ultrasound guidance to visualize suprascapular notch and suprascapular artery Indication(s): Assistance in needle guidance and placement for procedures requiring needle placement in or near specific anatomical locations not easily accessible without such assistance. Exposure Time: Please see nurses notes. Contrast: Before injecting any contrast, we confirmed that the patient did not have an allergy to iodine, shellfish, or radiological contrast. Once satisfactory needle placement was completed at the desired level, radiological contrast was injected. Contrast injected under live fluoroscopy. No contrast complications. See chart for type and volume of contrast used. Fluoroscopic Guidance: I was personally present during the use of fluoroscopy. "Tunnel Vision Technique" used to obtain the best possible view of the target area. Parallax error corrected before  commencing the procedure. "Direction-depth-direction" technique used to introduce the needle under continuous pulsed fluoroscopy. Once target was reached, antero-posterior, oblique, and lateral fluoroscopic projection used confirm needle placement in all planes. Images permanently stored in EMR. Interpretation: I personally interpreted the imaging intraoperatively. Adequate needle placement confirmed in multiple planes. Appropriate spread of contrast into desired area was observed. No evidence of afferent or efferent intravascular uptake. Permanent images saved into the patient's record.  Antibiotic Prophylaxis:   Anti-infectives (From admission, onward)   None     Indication(s): None identified  Post-operative Assessment:  Post-procedure Vital Signs:  Pulse/HCG Rate: 97(!) (P) 126 Temp: (!) 97.3 F (36.3 C) Resp: (P) 20 BP: (!) (P) 151/113 (Dr. aware of consistene elevated BP and rapid A. fib.) SpO2: (P) 97 %  EBL: None  Complications: No immediate post-treatment complications observed by team, or reported by patient.  Note: The patient tolerated the entire procedure well. A repeat set of vitals were taken after the procedure and the patient was kept under observation following institutional policy, for this type of procedure. Post-procedural neurological assessment was performed, showing return to baseline, prior to discharge. The patient was provided with post-procedure discharge instructions, including a section on how to identify potential problems. Should any problems arise concerning this procedure, the patient was given instructions to immediately contact , at any time, without hesitation. In any case, we plan to contact the patient by telephone for a follow-up status report regarding this interventional procedure.  Comments:  No additional relevant information.  Plan of Care  Orders:  Orders Placed This Encounter  Procedures  . DG PAIN CL[facility] C-ARM 1-60 MIN NO REPORT     Intraoperative interpretation by procedural physician at [facility].    Standing Status:   Standing    Number of Occurrences:   1    Order Specific Question:   Reason for exam:    Answer:   Assistance in needle  guidance and placement for procedures requiring needle placement in or near specific anatomical locations not easily accessible without such assistance.    Medications ordered for procedure: Meds ordered this encounter  Medications  . iohexol (OMNIPAQUE) 180 MG/ML injection 10 mL    Must be Myelogram-compatible. If not available, you may substitute with a water-soluble, non-ionic, hypoallergenic, myelogram-compatible radiological contrast medium.  . lidocaine (XYLOCAINE) 2 % (with pres) injection 400 mg  . ropivacaine (PF) 2 mg/mL (0.2%) (NAROPIN) injection 2 mL  . sodium chloride Marland Kitchenflush (NS) 0.9 % injection 2 mL  . dexamethasone (DECADRON) injection 10 mg  . ropivacaine (PF) 2 mg/mL (0.2%) (NAROPIN) injection 4 mL  . methylPREDNISolone acetate (DEPO-MEDROL) injection 40 mg   Medications administered: We administered iohexol, lidocaine, ropivacaine (PF) 2 mg/mL (0.2%), sodium chloride flush, dexamethasone, ropivacaine (PF) 2 mg/mL (0.2%), and methylPREDNISolone acetate.  See the medical record for exact dosing, route, and time of administration.  Follow-up plan:   Return in about 6 weeks (around 06/08/2020) for Post Procedure Evaluation, virtual.     Recent Visits Date Type Provider Dept  04/06/20 Office Visit Edward JollyLateef, Alyanah Elliott, MD [facility]  03/23/20 Office Visit Edward JollyLateef, Danuta Huseman, MD [facility]  03/02/20 Procedure visit Edward JollyLateef, Wyolene Weimann, MD [facility]  02/24/20 Telemedicine Edward JollyLateef, Jumaane Weatherford, MD [facility]  Showing recent visits within past 90 days and meeting all other requirements Today's Visits Date Type Provider Dept  04/27/20 Procedure visit Edward JollyLateef, Jazzlin Clements, MD [facility]  Showing today's visits and meeting all  other requirements Future Appointments Date Type Provider Dept  06/04/20 Appointment Edward JollyLateef, Samvel Zinn, MD [facility]  Showing future appointments within next 90 days and meeting all other requirements  Disposition: Discharge home  Discharge (Date  Time): 04/27/2020;   hrs.   Primary Care Physician: Danella PentonMiller, Mark F, MD [facility] Note by: Edward JollyBilal Ethelreda Sukhu, MD Date: 04/27/2020; Time: 1:15 PM  Disclaimer:  Medicine is not an exact science. The only guarantee in medicine is that nothing is guaranteed. It is important to note that the decision to proceed with this intervention was based on the information collected from the patient. The Data and conclusions were drawn from the patient's questionnaire, the interview, and the physical examination. Because the information was provided in large part by the patient, it cannot be guaranteed that it has not been purposely or unconsciously manipulated. Every effort has been made to obtain as much relevant data as possible for this evaluation. It is important to note that the conclusions that lead to this procedure are derived in large part from the available data. Always take into account that the treatment will also be dependent on availability of resources and existing treatment guidelines, considered by other Pain Management Practitioners as being common knowledge and practice, at the time of the intervention. For Medico-Legal purposes, it is also important to point out that variation in procedural techniques and pharmacological choices are the acceptable norm. The indications, contraindications, technique, and results of the above procedure should only be interpreted and judged by a Board-Certified Interventional Pain Specialist with extensive familiarity and expertise in the same exact procedure and technique.

## 2020-04-28 ENCOUNTER — Telehealth: Payer: Self-pay

## 2020-04-28 NOTE — Telephone Encounter (Signed)
Post procedure follow up. Patient  Son in law states they will get in touch with her and call us back if there are any questions or concerns.

## 2020-06-03 ENCOUNTER — Encounter: Payer: Self-pay | Admitting: Student in an Organized Health Care Education/Training Program

## 2020-06-04 ENCOUNTER — Other Ambulatory Visit: Payer: Self-pay

## 2020-06-04 ENCOUNTER — Ambulatory Visit
Payer: Medicare Other | Attending: Student in an Organized Health Care Education/Training Program | Admitting: Student in an Organized Health Care Education/Training Program

## 2020-06-04 ENCOUNTER — Encounter: Payer: Self-pay | Admitting: Student in an Organized Health Care Education/Training Program

## 2020-06-04 DIAGNOSIS — Z8739 Personal history of other diseases of the musculoskeletal system and connective tissue: Secondary | ICD-10-CM | POA: Diagnosis not present

## 2020-06-04 DIAGNOSIS — G894 Chronic pain syndrome: Secondary | ICD-10-CM

## 2020-06-04 DIAGNOSIS — M5416 Radiculopathy, lumbar region: Secondary | ICD-10-CM

## 2020-06-04 DIAGNOSIS — G8929 Other chronic pain: Secondary | ICD-10-CM

## 2020-06-04 DIAGNOSIS — M48062 Spinal stenosis, lumbar region with neurogenic claudication: Secondary | ICD-10-CM

## 2020-06-04 DIAGNOSIS — M25512 Pain in left shoulder: Secondary | ICD-10-CM

## 2020-06-04 DIAGNOSIS — M19012 Primary osteoarthritis, left shoulder: Secondary | ICD-10-CM

## 2020-06-04 DIAGNOSIS — M25511 Pain in right shoulder: Secondary | ICD-10-CM

## 2020-06-04 DIAGNOSIS — M19011 Primary osteoarthritis, right shoulder: Secondary | ICD-10-CM | POA: Diagnosis not present

## 2020-06-04 NOTE — Progress Notes (Signed)
Patient: Loretta Webb  Service Category: E/M  Provider: Gillis Santa, MD  DOB: January 12, 1932  DOS: 06/04/2020  Location: Office  MRN: [id number]  Setting: Ambulatory outpatient  Referring Provider: Rusty Aus, MD  Type: Established Patient  Specialty: Interventional Pain Management  PCP: Loretta Aus, MD  Location: Home  Delivery: TeleHealth     Virtual Encounter - Pain Management PROVIDER NOTE: Information contained herein reflects review and annotations entered in association with encounter. Interpretation of such information and data should be left to medically-trained personnel. Information provided to patient can be located elsewhere in the medical record under "Patient Instructions". Document created using STT-dictation technology, any transcriptional errors that may result from process are unintentional.    Contact & Pharmacy Preferred: [phone] Home: [phone] (home) Mobile: There is no such number on file (mobile). E-mail: [email or web address] - [place], [place] [address] Phone: [phone] Fax: [phone]   Pre-screening  Loretta Webb offered "in-person" vs "virtual" encounter. She indicated preferring virtual for this encounter.   Reason COVID-19*  Social distancing based on CDC and AMA recommendations.   I contacted Loretta Webb on 06/04/2020 via video conference.      I clearly identified myself as Loretta Santa, MD. I verified that I was speaking with the correct person using two identifiers (Name: Loretta Webb, and date of birth: 10-Aug-1931).  Consent I sought verbal advanced consent from Loretta Webb for virtual visit interactions. I informed Loretta Webb of possible security and privacy concerns, risks, and limitations associated with providing "not-in-person" medical evaluation and management services. I also informed Loretta Webb of the availability of "in-person" appointments.  Finally, I informed her that there would be a charge for the virtual visit and that she could be  personally, fully or partially, financially responsible for it. Loretta Webb expressed understanding and agreed to proceed.   Historic Elements   Loretta Webb is a 87 y.o. year old, female patient evaluated today after our last contact on 04/27/2020. Loretta Webb  has a past medical history of Arthritis, Chronic fatigue, DM2 (diabetes mellitus, type 2) ([place]), Esophageal stricture, Fibromyalgia, GERD (gastroesophageal reflux disease), Hypertension, Lumbar stenosis, Osteoporosis, and Sleep apnea. She also  has a past surgical history that includes knee replacements; Abdominal hysterectomy; Rotator cuff repair; and Appendectomy. Loretta Webb has a current medication list which includes the following prescription(s): acetaminophen, alprazolam, apixaban, aspirin, aspirin, bumetanide, cholecalciferol, vitamin b-12, diclofenac sodium, digoxin, diltiazem, donepezil, duloxetine, hydrocodone-acetaminophen, losartan, magnesium, metformin, pantoprazole, propranolol, trazodone, and pravastatin. She  reports that she has never smoked. She has never used smokeless tobacco. She reports that she does not drink alcohol and does not use drugs. Loretta Webb has No Known Allergies.   HPI  Today, she is being contacted for a post-procedure assessment.  I spoke with the patient's caregiver/daughter Loretta Webb.  Loretta Webb states that her mother is doing very well in regards to her right shoulder pain and right low back and leg pain after her right suprascapular nerve block and right caudal epidural steroid injection that was done on April 28, 2020.  Unfortunately her mother did fall yesterday so is recovering from that.  Even prior to her fall, her mother was endorsing worsening left shoulder pain.  She is status post 2 diagnostic/therapeutic suprascapular nerve blocks on the left side which provided her with approximately 50% pain relief  for 4 weeks.  This was done on 03/23/2020.  Given return of pain, we discussed pulsed radiofrequency ablation of the left suprascapular nerve for the purpose of obtaining longer term relief.  Risks and benefits reviewed and patient would like to proceed. Post-Procedure Evaluation  Procedure (04/27/2020): Right suprascapular nerve block, right caudal ESI  Sedation: Please see nurses note.  Effectiveness during initial hour after procedure(Ultra-Short Term Relief): 100 %  Local anesthetic used: Long-acting (4-6 hours) Effectiveness: Defined as any analgesic benefit obtained secondary to the administration of local anesthetics. This carries significant diagnostic value as to the etiological location, or anatomical origin, of the pain. Duration of benefit is expected to coincide with the duration of the local anesthetic used.  Effectiveness during initial 4-6 hours after procedure(Short-Term Relief): 100 % .  Long-term benefit: Defined as any relief past the pharmacologic duration of the local anesthetics.  Effectiveness past the initial 6 hours after procedure(Long-Term Relief):  (100 % for back 80% for shoulder)   Current benefits: Defined as benefit that persist at this time.   Analgesia:  >50% relief Function: Somewhat improved ROM: Loretta Webb reports improvement in ROM   Laboratory Chemistry Profile   Renal Lab Results  Component Value Date   BUN 14 09/03/2019   CREATININE 1.44 (H) 09/03/2019   BCR 10 (L) 09/03/2019   GFRAA 38 (L) 09/03/2019   GFRNONAA 33 (L) 09/03/2019     Hepatic Lab Results  Component Value Date   AST 12 09/03/2019   ALT 10 (L) 11/25/2016   ALBUMIN 4.3 09/03/2019   ALKPHOS 50 09/03/2019   LIPASE 42 09/11/2015     Electrolytes Lab Results  Component Value Date   NA 140 09/03/2019   K 4.1 09/03/2019   CL 104 09/03/2019   CALCIUM 9.8 09/03/2019   MG 2.0 09/03/2019     Bone Lab Results  Component Value Date   25OHVITD1 39 09/03/2019   25OHVITD2  3.4 09/03/2019   25OHVITD3 36 09/03/2019     Inflammation (CRP: Acute Phase) (ESR: Chronic Phase) Lab Results  Component Value Date   CRP <1 09/03/2019   ESRSEDRATE 17 09/03/2019       Note: Above Lab results reviewed.   Assessment  The primary encounter diagnosis was Localized osteoarthritis of shoulder regions, bilateral. Diagnoses of Chronic radicular lumbar pain, History of rotator cuff syndrome (bilateral), Lumbar stenosis with neurogenic claudication, Chronic shoulder pain (3ry area of Pain) (Bilateral) (L>R), and Chronic pain syndrome were also pertinent to this visit.  Plan of Care   Loretta Webb has a current medication list which includes the following long-term medication(s): apixaban, bumetanide, digoxin, diltiazem, donepezil, duloxetine, losartan, metformin, pantoprazole, pravastatin, propranolol, and trazodone.  1.  Status post 2 series of diagnostic/therapeutic suprascapular nerve blocks for her left shoulder, most previously done 03/23/2020.  Provided her 50% pain relief for at least 3 weeks.  Discussed pulsed radiofrequency ablation for the left suprascapular nerve. 2.  Right shoulder is doing well in regards to range of motion and overall pain after right suprascapular nerve block 3.  Low back and right leg pain improved after caudal ESI, repeat as needed  Orders:  Orders Placed This Encounter  Procedures  . Radiofrequency Shoulder Joint    For shoulder pain.    Standing Status:   Future    Standing Expiration Date:   06/04/2021    Scheduling Instructions:     Procedure: Suprascapular Nerve Radiofrequency Ablation     Laterality: LEFT     Level(s): Suprascapular nerve     Sedation: without  Order Specific Question:   Where will this procedure be performed?    Answer:   [facility]  . Caudal Epidural Injection    Standing Status:   Standing    Number of Occurrences:   1    Standing Expiration Date:   06/04/2021    Scheduling Instructions:      Laterality: Midline     Level(s): Sacrococcygeal canal (Tailbone area)     Sedation: Patient's choice      TIMEFRAME: PRN procedure. (Loretta Webb will call when needed.)    Order Specific Question:   Where will this procedure be performed?    Answer:   [facility]   Follow-up plan:   Return in about 4 weeks (around 07/02/2020) for Left SSN pRFA, without sedation.   Recent Visits Date Type Provider Dept  04/27/20 Procedure visit Loretta Santa, MD [facility]  04/06/20 Office Visit Loretta Santa, MD [facility]  03/23/20 Office Visit Loretta Santa, MD [facility]  Showing recent visits within past 90 days and meeting all other requirements Today's Visits Date Type Provider Dept  06/04/20 Telemedicine Loretta Santa, MD [facility]  Showing today's visits and meeting all other requirements Future Appointments No visits were found meeting these conditions. Showing future appointments within next 90 days and meeting all other requirements  I discussed the assessment and treatment plan with the patient. The patient was provided an opportunity to ask questions and all were answered. The patient agreed with the plan and demonstrated an understanding of the instructions.  Patient advised to call back or seek an in-person evaluation if the symptoms or condition worsens.  Duration of encounter: 30 minutes.   Note by: Loretta Santa, MD Date: 06/04/2020; Time: 3:16 PM

## 2020-06-19 ENCOUNTER — Telehealth: Payer: Self-pay | Admitting: Student in an Organized Health Care Education/Training Program

## 2020-06-19 DIAGNOSIS — M1612 Unilateral primary osteoarthritis, left hip: Secondary | ICD-10-CM

## 2020-06-19 NOTE — Telephone Encounter (Signed)
Patient's daughter Eunice Blase called stating the patient is having a lot of pain in left hip as well. Can she get a hip injection the same day as shoulder RF ? Explained phys is out of office and we will ask when he gets back

## 2020-06-19 NOTE — Telephone Encounter (Signed)
Can we do this. Will have to ask Alona Bene about PA.

## 2020-06-24 ENCOUNTER — Ambulatory Visit (HOSPITAL_BASED_OUTPATIENT_CLINIC_OR_DEPARTMENT_OTHER): Payer: Medicare Other | Admitting: Student in an Organized Health Care Education/Training Program

## 2020-06-24 ENCOUNTER — Ambulatory Visit
Admission: RE | Admit: 2020-06-24 | Discharge: 2020-06-24 | Disposition: A | Discharge status: Home / Self Care | Payer: Medicare Other | Source: Ambulatory Visit | Attending: Student in an Organized Health Care Education/Training Program | Admitting: Student in an Organized Health Care Education/Training Program

## 2020-06-24 ENCOUNTER — Other Ambulatory Visit: Payer: Self-pay

## 2020-06-24 ENCOUNTER — Encounter: Payer: Self-pay | Admitting: Student in an Organized Health Care Education/Training Program

## 2020-06-24 VITALS — BP 138/99 | HR 132 | Temp 97.0°F | Resp 25 | Ht 65.0 in | Wt 140.0 lb

## 2020-06-24 DIAGNOSIS — M19012 Primary osteoarthritis, left shoulder: Secondary | ICD-10-CM | POA: Diagnosis not present

## 2020-06-24 DIAGNOSIS — M47818 Spondylosis without myelopathy or radiculopathy, sacral and sacrococcygeal region: Secondary | ICD-10-CM

## 2020-06-24 DIAGNOSIS — G894 Chronic pain syndrome: Secondary | ICD-10-CM

## 2020-06-24 DIAGNOSIS — M1612 Unilateral primary osteoarthritis, left hip: Secondary | ICD-10-CM | POA: Diagnosis not present

## 2020-06-24 DIAGNOSIS — M25511 Pain in right shoulder: Secondary | ICD-10-CM

## 2020-06-24 DIAGNOSIS — M25512 Pain in left shoulder: Secondary | ICD-10-CM | POA: Diagnosis not present

## 2020-06-24 DIAGNOSIS — Z8739 Personal history of other diseases of the musculoskeletal system and connective tissue: Secondary | ICD-10-CM | POA: Diagnosis not present

## 2020-06-24 DIAGNOSIS — M461 Sacroiliitis, not elsewhere classified: Secondary | ICD-10-CM | POA: Insufficient documentation

## 2020-06-24 DIAGNOSIS — M533 Sacrococcygeal disorders, not elsewhere classified: Secondary | ICD-10-CM | POA: Insufficient documentation

## 2020-06-24 DIAGNOSIS — M19011 Primary osteoarthritis, right shoulder: Secondary | ICD-10-CM

## 2020-06-24 DIAGNOSIS — G8929 Other chronic pain: Secondary | ICD-10-CM

## 2020-06-24 MED ORDER — IOHEXOL 180 MG/ML  SOLN
10.0000 mL | Freq: Once | INTRAMUSCULAR | Status: AC
  Administered 2020-06-24: 10 mL via INTRA_ARTICULAR
  Filled 2020-06-24: qty 20

## 2020-06-24 MED ORDER — DEXAMETHASONE SODIUM PHOSPHATE 10 MG/ML IJ SOLN
10.0000 mg | Freq: Once | INTRAMUSCULAR | Status: AC
  Administered 2020-06-24: 10 mg

## 2020-06-24 MED ORDER — METHYLPREDNISOLONE ACETATE 40 MG/ML IJ SUSP
INTRAMUSCULAR | Status: AC
  Filled 2020-06-24: qty 1

## 2020-06-24 MED ORDER — LIDOCAINE HCL (PF) 2 % IJ SOLN
INTRAMUSCULAR | Status: AC
  Filled 2020-06-24: qty 10

## 2020-06-24 MED ORDER — DEXAMETHASONE SODIUM PHOSPHATE 10 MG/ML IJ SOLN
INTRAMUSCULAR | Status: AC
  Filled 2020-06-24: qty 1

## 2020-06-24 MED ORDER — ROPIVACAINE HCL 2 MG/ML IJ SOLN
9.0000 mL | Freq: Once | INTRAMUSCULAR | Status: AC
  Administered 2020-06-24: 9 mL via PERINEURAL

## 2020-06-24 MED ORDER — ROPIVACAINE HCL 2 MG/ML IJ SOLN
INTRAMUSCULAR | Status: AC
  Filled 2020-06-24: qty 10

## 2020-06-24 MED ORDER — METHYLPREDNISOLONE ACETATE 40 MG/ML IJ SUSP
40.0000 mg | Freq: Once | INTRAMUSCULAR | Status: AC
  Administered 2020-06-24: 40 mg via INTRA_ARTICULAR

## 2020-06-24 MED ORDER — LIDOCAINE HCL 2 % IJ SOLN
20.0000 mL | Freq: Once | INTRAMUSCULAR | Status: AC
  Administered 2020-06-24: 400 mg
  Filled 2020-06-24: qty 20

## 2020-06-24 NOTE — Progress Notes (Signed)
PROVIDER NOTE: Information contained herein reflects review and annotations entered in association with encounter. Interpretation of such information and data should be left to medically-trained personnel. Information provided to patient can be located elsewhere in the medical record under "Patient Instructions". Document created using STT-dictation technology, any transcriptional errors that may result from process are unintentional.    Patient: Loretta Webb  Service Category: Procedure  Provider: Edward Jolly, MD  DOB: March 17, 1932  DOS: 06/24/2020  Location: [facility]  MRN: [id number]  Setting: Ambulatory - outpatient  Referring Provider: [name], MD  Type: Established Patient  Specialty: Interventional Pain Management  PCP: [name], MD   Primary Reason for Visit: Interventional Pain Management Treatment. CC: Shoulder Pain (Left ) and Hip Pain (Left )  Procedure:          Anesthesia, Analgesia, Anxiolysis:  Type: Suprascapular nerve PULSED Radiofrequency Ablation          Primary Purpose: Therapeutic Region: Posterior Shoulder & Scapular Areas Level: Superior to the scapular spine, in the lateral aspect of the supraspinatus fossa (Suprescapular notch). Target Area: Suprascapular nerve as it passes thru the lower portion of the suprascapular notch. Approach: Posterior percutaneous approach. Laterality: Left-Side  Type: Local Anesthesia   Position: Prone   Indications: 1. Localized osteoarthritis of shoulder regions, bilateral   2. History of rotator cuff syndrome (bilateral)   3. Chronic shoulder pain (3ry area of Pain) (Bilateral) (L>R)   4. Primary osteoarthritis of left hip   5. Chronic pain syndrome   6. SI joint arthritis    Loretta Webb has been dealing with the above chronic pain for longer than three months and has either failed to respond, was unable to tolerate, or simply did not get enough benefit from other more conservative therapies  including, but not limited to: 1. Over-the-counter medications 2. Anti-inflammatory medications 3. Muscle relaxants 4. Membrane stabilizers 5. Opioids 6. Physical therapy and/or chiropractic manipulation 7. Modalities (Heat, ice, etc.) 8. Invasive techniques such as nerve blocks. Loretta Webb has attained more than 50% relief of the pain from a series of diagnostic injections conducted in separate occasions.  Pain Score: Pre-procedure: 10-Worst pain ever/10 Post-procedure: 0-No pain (putting on shirt, moving arms up and down, moving shoulder well)/10  Pre-op H&P Assessment:  Loretta Webb is a 87 y.o. (year old), female patient, seen today for interventional treatment. She  has a past surgical history that includes knee replacements; Abdominal hysterectomy; Rotator cuff repair; and Appendectomy. Loretta Webb has a current medication list which includes the following prescription(s): acetaminophen, alprazolam, aspirin, bumetanide, cholecalciferol, vitamin b-12, diclofenac sodium, diltiazem, donepezil, duloxetine, hydrocodone-acetaminophen, losartan, magnesium, metformin, pantoprazole, propranolol, trazodone, apixaban, aspirin, digoxin, and pravastatin. Her primarily concern today is the Shoulder Pain (Left ) and Hip Pain (Left )  Initial Vital Signs:  Pulse/HCG Rate: (!) 113  Temp: (!) 97 F (36.1 C) Resp: 16 BP: (!) 96/53 SpO2: 100 %  BMI: Estimated body mass index is 23.3 kg/m as calculated from the following:   Height as of this encounter: 5\' 5"  (1.651 m).   Weight as of this encounter: 140 lb (63.5 kg).  Risk Assessment: Allergies: Reviewed. She has No Known Allergies.  Allergy Precautions: None required Coagulopathies: Reviewed. None identified.  Blood-thinner therapy: None at this time Active Infection(s): Reviewed. None identified. Loretta Webb is afebrile  Site Confirmation: Loretta Webb was asked to confirm the procedure and laterality before marking the site Procedure checklist:  Completed Consent: Before the procedure and under  the influence of no sedative(s), amnesic(s), or anxiolytics, the patient was informed of the treatment options, risks and possible complications. To fulfill our ethical and legal obligations, as recommended by the American Medical Association's Code of Ethics, I have informed the patient of my clinical impression; the nature and purpose of the treatment or procedure; the risks, benefits, and possible complications of the intervention; the alternatives, including doing nothing; the risk(s) and benefit(s) of the alternative treatment(s) or procedure(s); and the risk(s) and benefit(s) of doing nothing. The patient was provided information about the general risks and possible complications associated with the procedure. These may include, but are not limited to: failure to achieve desired goals, infection, bleeding, organ or nerve damage, allergic reactions, paralysis, and death. In addition, the patient was informed of those risks and complications associated to the procedure, such as failure to decrease pain; infection; bleeding; organ or nerve damage with subsequent damage to sensory, motor, and/or autonomic systems, resulting in permanent pain, numbness, and/or weakness of one or several areas of the body; allergic reactions; (i.e.: anaphylactic reaction); and/or death. Furthermore, the patient was informed of those risks and complications associated with the medications. These include, but are not limited to: allergic reactions (i.e.: anaphylactic or anaphylactoid reaction(s)); adrenal axis suppression; blood sugar elevation that in diabetics may result in ketoacidosis or comma; water retention that in patients with history of congestive heart failure may result in shortness of breath, pulmonary edema, and decompensation with resultant heart failure; weight gain; swelling or edema; medication-induced neural toxicity; particulate matter embolism and blood vessel  occlusion with resultant organ, and/or nervous system infarction; and/or aseptic necrosis of one or more joints. Finally, the patient was informed that Medicine is not an exact science; therefore, there is also the possibility of unforeseen or unpredictable risks and/or possible complications that may result in a catastrophic outcome. The patient indicated having understood very clearly. We have given the patient no guarantees and we have made no promises. Enough time was given to the patient to ask questions, all of which were answered to the patient's satisfaction. [name] has indicated that she wanted to continue with the procedure. Attestation: I, the ordering provider, attest that I have discussed with the patient the benefits, risks, side-effects, alternatives, likelihood of achieving goals, and potential problems during recovery for the procedure that I have provided informed consent. Date  Time: 06/24/2020  7:51 AM  Pre-Procedure Preparation:  Monitoring: As per clinic protocol. Respiration, ETCO2, SpO2, BP, heart rate and rhythm monitor placed and checked for adequate function Safety Precautions: Patient was assessed for positional comfort and pressure points before starting the procedure. Time-out: I initiated and conducted the "Time-out" before starting the procedure, as per protocol. The patient was asked to participate by confirming the accuracy of the "Time Out" information. Verification of the correct person, site, and procedure were performed and confirmed by me, the nursing staff, and the patient. "Time-out" conducted as per Joint Commission's Universal Protocol (UP.01.01.01). Time: 0838  Description of Procedure:          Area Prepped: Entire shoulder and scapular area DuraPrep (Iodine Povacrylex [0.7% available iodine] and Isopropyl Alcohol, 74% w/w) Safety Precautions: Aspiration looking for blood return was conducted prior to all injections. At no point did we inject any  substances, as a needle was being advanced. No attempts were made at seeking any paresthesias. Safe injection practices and needle disposal techniques used. Medications properly checked for expiration dates. SDV (single dose vial) medications used. Description of the  Procedure: Protocol guidelines were followed. The patient was placed in position over the procedure table. The target area was identified and the area prepped in the usual manner. The skin and muscle were infiltrated with local anesthetic. Appropriate amount of time allowed to pass for local anesthetics to take effect. Radiofrequency needles were introduced to the target area using fluoroscopic guidance. Using the NeuroTherm NT1100 Radiofrequency Generator, sensory stimulation using 50 Hz was used to locate & identify the nerve, making sure that the needle was positioned such that there was no sensory stimulation below 0.3 V or above 0.7 V. Stimulation using 2 Hz was used to evaluate the motor component. Care was taken not to lesion any nerves that demonstrated motor stimulation of the lower extremities at an output of less than 2.5 times that of the sensory threshold, or a maximum of 2.0 V. Once satisfactory placement of the needles was achieved, the numbing solution was slowly injected after negative aspiration. After waiting for at least 2 minutes, the ablation was performed at 42 degrees C for 120 seconds, using Pulsed Radiofrequency settings. Once the procedure was completed, the needles were then removed and the area cleansed, making sure to leave some of the prepping solution back to take advantage of its long term bactericidal properties. Intra-operative Compliance: Compliant   Vitals:   06/24/20 0845 06/24/20 0850 06/24/20 0855 06/24/20 0900  BP: (!) 128/109 121/85 (!) 131/103 (!) 138/99  Pulse: (!) 128 (!) 140 (!) 135 (!) 132  Resp: (!) 24 (!) 26 (!) 26 (!) 25  Temp:      TempSrc:      SpO2: 97% 95% 96% 98%  Weight:      Height:         Start Time: 0838 hrs. End Time: 0859 hrs.  Materials & Medications:  Needle(s) Type: Teflon-coated, curved tip, Radiofrequency needle(s) Gauge: 22G Length: 5cm Medication(s): Please see orders for medications and dosing details. 2 cc solution made of 1 cc of 0.2% ropivacaine, 1 cc of Decadron 10 mg/cc.  Injected along the left suprascapular nerve after sensorimotor testing, prior to lesioning. Imaging Guidance (Non-Spinal):          Type of Imaging Technique: Fluoroscopy Guidance (Non-Spinal) Indication(s): Assistance in needle guidance and placement for procedures requiring needle placement in or near specific anatomical locations not easily accessible without such assistance. Exposure Time: Please see nurses notes. Contrast: Before injecting any contrast, we confirmed that the patient did not have an allergy to iodine, shellfish, or radiological contrast. Once satisfactory needle placement was completed at the desired level, radiological contrast was injected. Contrast injected under live fluoroscopy. No contrast complications. See chart for type and volume of contrast used. Fluoroscopic Guidance: I was personally present during the use of fluoroscopy. "Tunnel Vision Technique" used to obtain the best possible view of the target area. Parallax error corrected before commencing the procedure. "Direction-depth-direction" technique used to introduce the needle under continuous pulsed fluoroscopy. Once target was reached, antero-posterior, oblique, and lateral fluoroscopic projection used confirm needle placement in all planes. Images permanently stored in EMR. Interpretation: I personally interpreted the imaging intraoperatively. Adequate needle placement confirmed in multiple planes. Appropriate spread of contrast into desired area was observed. No evidence of afferent or efferent intravascular uptake. Permanent images saved into the patient's record.   Post-operative Assessment:   Post-procedure Vital Signs:  Pulse/HCG Rate: (!) 132 (irreg HR)  Temp: (!) 97 F (36.1 C) Resp: (!) 25 BP: (!) 138/99 SpO2: 98 %  EBL: None  Complications: No immediate post-treatment complications observed by team, or reported by patient.  Note: The patient tolerated the entire procedure well. A repeat set of vitals were taken after the procedure and the patient was kept under observation following institutional policy, for this type of procedure. Post-procedural neurological assessment was performed, showing return to baseline, prior to discharge. The patient was provided with post-procedure discharge instructions, including a section on how to identify potential problems. Should any problems arise concerning this procedure, the patient was given instructions to immediately contact us, at any time, without hesitation. In any case, we plan to contact the patient by telephone for a follow-up status report regarding this interventional procedure.  Comments:  No additional relevant information.  Plan of Care  Orders:  Orders Placed This Encounter  Procedures  . [facility] C-ARM 1-60 MIN NO REPORT    Intraoperative interpretation by procedural physician at [facility].    Standing Status:   Standing    Number of Occurrences:   1    Order Specific Question:   Reason for exam:    Answer:   Assistance in needle guidance and placement for procedures requiring needle placement in or near specific anatomical locations not easily accessible without such assistance.    Medications ordered for procedure: Meds ordered this encounter  Medications  . iohexol (OMNIPAQUE) 180 MG/ML injection 10 mL    Must be Myelogram-compatible. If not available, you may substitute with a water-soluble, non-ionic, hypoallergenic, myelogram-compatible radiological contrast medium.  Marland Kitchen lidocaine (XYLOCAINE) 2 % (with pres) injection 400 mg  . dexamethasone (DECADRON) injection 10 mg  . ropivacaine  (PF) 2 mg/mL (0.2%) (NAROPIN) injection 9 mL  . methylPREDNISolone acetate (DEPO-MEDROL) injection 40 mg   Medications administered: We administered iohexol, lidocaine, dexamethasone, ropivacaine (PF) 2 mg/mL (0.2%), and methylPREDNISolone acetate.  See the medical record for exact dosing, route, and time of administration.  Follow-up plan:   Return in about 4 weeks (around 07/22/2020) for Post Procedure Evaluation, virtual.    Recent Visits Date Type Provider Dept  06/04/20 Telemedicine Edward Jolly, MD [facility]  04/27/20 Procedure visit Edward Jolly, MD [facility]  04/06/20 Office Visit Edward Jolly, MD [facility]  Showing recent visits within past 90 days and meeting all other requirements Today's Visits Date Type Provider Dept  06/24/20 Procedure visit Edward Jolly, MD [facility]  Showing today's visits and meeting all other requirements Future Appointments Date Type Provider Dept  07/20/20 Appointment Edward Jolly, MD [facility]  Showing future appointments within next 90 days and meeting all other requirements  Disposition: Discharge home  Discharge (Date  Time): 06/24/2020; 0908 hrs.   Primary Care Physician: [name], MD Location: [facility] Note by: Edward Jolly, MD Date: 06/24/2020; Time: 10:04 AM  Disclaimer:  Medicine is not an exact science. The only guarantee in medicine is that nothing is guaranteed. It is important to note that the decision to proceed with this intervention was based on the information collected from the patient. The Data and conclusions were drawn from the patient's questionnaire, the interview, and the physical examination. Because the information was provided in large part by the patient, it cannot be guaranteed that it has not been purposely or unconsciously manipulated. Every effort has been made to obtain as much relevant data as possible for this  evaluation. It is important to note that the conclusions that lead to this procedure are derived in large part  from the available data. Always take into account that the treatment will also be dependent on availability of resources and existing treatment guidelines, considered by other Pain Management Practitioners as being common knowledge and practice, at the time of the intervention. For Medico-Legal purposes, it is also important to point out that variation in procedural techniques and pharmacological choices are the acceptable norm. The indications, contraindications, technique, and results of the above procedure should only be interpreted and judged by a Board-Certified Interventional Pain Specialist with extensive familiarity and expertise in the same exact procedure and technique.

## 2020-06-24 NOTE — Progress Notes (Signed)
Safety precautions to be maintained throughout the outpatient stay will include: orient to surroundings, keep bed in low position, maintain call bell within reach at all times, provide assistance with transfer out of bed and ambulation.  

## 2020-06-24 NOTE — Patient Instructions (Addendum)
____________________________________________________________________________________________  Post-Procedure Discharge Instructions  Instructions:  Apply ice:   Purpose: This will minimize any swelling and discomfort after procedure.   When: Day of procedure, as soon as you get home.  How: Fill a plastic sandwich bag with crushed ice. Cover it with a small towel and apply to injection site.  How long: (15 min on, 15 min off) Apply for 15 minutes then remove x 15 minutes.  Repeat sequence on day of procedure, until you go to bed.  Apply heat:   Purpose: To treat any soreness and discomfort from the procedure.  When: Starting the next day after the procedure.  How: Apply heat to procedure site starting the day following the procedure.  How long: May continue to repeat daily, until discomfort goes away.  Food intake: Start with clear liquids (like water) and advance to regular food, as tolerated.   Physical activities: Keep activities to a minimum for the first 8 hours after the procedure. After that, then as tolerated.  Driving: If you have received any sedation, be responsible and do not drive. You are not allowed to drive for 24 hours after having sedation.  Blood thinner: (Applies only to those taking blood thinners) You may restart your blood thinner 6 hours after your procedure.  Insulin: (Applies only to Diabetic patients taking insulin) As soon as you can eat, you may resume your normal dosing schedule.  Infection prevention: Keep procedure site clean and dry. Shower daily and clean area with soap and water.  Post-procedure Pain Diary: Extremely important that this be done correctly and accurately. Recorded information will be used to determine the next step in treatment. For the purpose of accuracy, follow these rules:  Evaluate only the area treated. Do not report or include pain from an untreated area. For the purpose of this evaluation, ignore all other areas of pain,  except for the treated area.  After your procedure, avoid taking a long nap and attempting to complete the pain diary after you wake up. Instead, set your alarm clock to go off every hour, on the hour, for the initial 8 hours after the procedure. Document the duration of the numbing medicine, and the relief you are getting from it.  Do not go to sleep and attempt to complete it later. It will not be accurate. If you received sedation, it is likely that you were given a medication that may cause amnesia. Because of this, completing the diary at a later time may cause the information to be inaccurate. This information is needed to plan your care.  Follow-up appointment: Keep your post-procedure follow-up evaluation appointment after the procedure (usually 2 weeks for most procedures, 6 weeks for radiofrequencies). DO NOT FORGET to bring you pain diary with you.   Expect: (What should I expect to see with my procedure?)  From numbing medicine (AKA: Local Anesthetics): Numbness or decrease in pain. You may also experience some weakness, which if present, could last for the duration of the local anesthetic.  Onset: Full effect within 15 minutes of injected.  Duration: It will depend on the type of local anesthetic used. On the average, 1 to 8 hours.   From steroids (Applies only if steroids were used): Decrease in swelling or inflammation. Once inflammation is improved, relief of the pain will follow.  Onset of benefits: Depends on the amount of swelling present. The more swelling, the longer it will take for the benefits to be seen. In some cases, up to 10 days.    Duration: Steroids will stay in the system x 2 weeks. Duration of benefits will depend on multiple posibilities including persistent irritating factors.  Side-effects: If present, they may typically last 2 weeks (the duration of the steroids).  Frequent: Cramps (if they occur, drink Gatorade and take over-the-counter Magnesium 450-500 mg  once to twice a day); water retention with temporary weight gain; increases in blood sugar; decreased immune system response; increased appetite.  Occasional: Facial flushing (red, warm cheeks); mood swings; menstrual changes.  Uncommon: Long-term decrease or suppression of natural hormones; bone thinning. (These are more common with higher doses or more frequent use. This is why we prefer that our patients avoid having any injection therapies in other practices.)   Very Rare: Severe mood changes; psychosis; aseptic necrosis.  From procedure: Some discomfort is to be expected once the numbing medicine wears off. This should be minimal if ice and heat are applied as instructed.  Call if: (When should I call?)  You experience numbness and weakness that gets worse with time, as opposed to wearing off.  New onset bowel or bladder incontinence. (Applies only to procedures done in the spine)  Emergency Numbers:  Durning business hours (Monday - Thursday, 8:00 AM - 4:00 PM) (Friday, 9:00 AM - 12:00 Noon): [phone]  After hours: [phone]  NOTE: If you are having a problem and are unable connect with, or to talk to a provider, then go to your nearest urgent care or emergency department. If the problem is serious and urgent, please call 911. ____________________________________________________________________________________________  ___________________________________________________________________________________________  Post-Radiofrequency (RF) Discharge Instructions  You have just completed a Radiofrequency Neurotomy.  The following instructions will provide you with information and guidelines for self-care upon discharge.  If at any time you have questions or concerns please call your physician. DO NOT DRIVE YOURSELF!!  Instructions:  Apply ice: Fill a plastic sandwich bag with crushed ice. Cover it with a small towel and apply to injection site. Apply for 15 minutes then  remove x 15 minutes. Repeat sequence on day of procedure, until you go to bed. The purpose is to minimize swelling and discomfort after procedure.  Apply heat: Apply heat to procedure site starting the day following the procedure. The purpose is to treat any soreness and discomfort from the procedure.  Food intake: No eating limitations, unless stipulated above.  Nevertheless, if you have had sedation, you may experience some nausea.  In this case, it may be wise to wait at least two hours prior to resuming regular diet.  Physical activities: Keep activities to a minimum for the first 8 hours after the procedure. For the first 24 hours after the procedure, do not drive a motor vehicle,  Operate heavy machinery, power tools, or handle any weapons.  Consider walking with the use of an assistive device or accompanied by an adult for the first 24 hours.  Do not drink alcoholic beverages including beer.  Do not make any important decisions or sign any legal documents. Go home and rest today.  Resume activities tomorrow, as tolerated.  Use caution in moving about as you may experience mild leg weakness.  Use caution in cooking, use of household electrical appliances and climbing steps.  Driving: If you have received any sedation, you are not allowed to drive for 24 hours after your procedure.  Blood thinner: Restart your blood thinner 6 hours after your procedure. (Only for those taking blood thinners)  Insulin: As soon as you can eat, you may resume  your normal dosing schedule. (Only for those taking insulin)  Medications: May resume pre-procedure medications.  Do not take any drugs, other than what has been prescribed to you.  Infection prevention: Keep procedure site clean and dry.  Post-procedure Pain Diary: Extremely important that this be done correctly and accurately. Recorded information will be used to determine the next step in treatment.  Pain evaluated is that of treated area only. Do not  include pain from an untreated area.  Complete every hour, on the hour, for the initial 8 hours. Set an alarm to help you do this part accurately.  Do not go to sleep and have it completed later. It will not be accurate.  Follow-up appointment: Keep your follow-up appointment after the procedure. Usually 2-6 weeks after radiofrequency. Bring you pain diary. The information collected will be essential for your long-term care.   Expect:  From numbing medicine (AKA: Local Anesthetics): Numbness or decrease in pain.  Onset: Full effect within 15 minutes of injected.  Duration: It will depend on the type of local anesthetic used. On the average, 1 to 8 hours.   From steroids (when added): Decrease in swelling or inflammation. Once inflammation is improved, relief of the pain will follow.  Onset of benefits: Depends on the amount of swelling present. The more swelling, the longer it will take for the benefits to be seen. In some cases, up to 10 days.  Duration: Steroids will stay in the system x 2 weeks. Duration of benefits will depend on multiple posibilities including persistent irritating factors.  From procedure: Some discomfort is to be expected once the numbing medicine wears off. In the case of radiofrequency procedures, this may last as long as 6 weeks. Additional post-procedure pain medication is provided for this. Discomfort is minimized if ice and heat are applied as instructed.  Call if:  You experience numbness and weakness that gets worse with time, as opposed to wearing off.  He experience any unusual bleeding, difficulty breathing, or loss of the ability to control your bowel and bladder. (This applies to Spinal procedures only)  You experience any redness, swelling, heat, red streaks, elevated temperature, fever, or any other signs of a possible infection.  Emergency Numbers:  Durning business hours (Monday - Thursday, 8:00 AM - 4:00 PM) (Friday, 9:00 AM - 12:00 Noon):  [phone]  After hours: [phone] ____________________________________________________________________________________________  Pneumovax I did not Combunox Dr. Domenic Schwab  .  Document

## 2020-06-24 NOTE — Progress Notes (Signed)
PROVIDER NOTE: Information contained herein reflects review and annotations entered in association with encounter. Interpretation of such information and data should be left to medically-trained personnel. Information provided to patient can be located elsewhere in the medical record under "Patient Instructions". Document created using STT-dictation technology, any transcriptional errors that may result from process are unintentional.    Patient: Loretta Webb  Service Category: Procedure  Provider: Edward Jolly, MD  DOB: 14-Oct-1931  DOS: 06/24/2020  Location: [facility]  MRN: [id number]  Setting: Ambulatory - outpatient  Referring Provider: [name], MD  Type: Established Patient  Specialty: Interventional Pain Management  PCP: [name], MD   Primary Reason for Visit: Interventional Pain Management Treatment. CC: Shoulder Pain (Left ) and Hip Pain (Left )  Procedure:          Anesthesia, Analgesia, Anxiolysis:  Type: Diagnostic Sacroiliac Joint Steroid Injection #1  Region: Inferior Lumbosacral Region Level: PIIS (Posterior Inferior Iliac Spine) Laterality: Left-Side  Type: Local Anesthesia  Local Anesthetic: Lidocaine 1-2%  Position: Prone           Indications:  Left sacroiliac joint pain  Pain Score: Pre-procedure: 10-Worst pain ever/10 Post-procedure: 0-No pain (putting on shirt, moving arms up and down, moving shoulder well)/10   Pre-op H&P Assessment:  Loretta Webb is a 87 y.o. (year old), female patient, seen today for interventional treatment. She  has a past surgical history that includes knee replacements; Abdominal hysterectomy; Rotator cuff repair; and Appendectomy. Loretta Webb has a current medication list which includes the following prescription(s): acetaminophen, alprazolam, aspirin, bumetanide, cholecalciferol, vitamin b-12, diclofenac sodium, diltiazem, donepezil, duloxetine, hydrocodone-acetaminophen, losartan, magnesium, metformin,  pantoprazole, propranolol, trazodone, apixaban, aspirin, digoxin, and pravastatin. Her primarily concern today is the Shoulder Pain (Left ) and Hip Pain (Left )  Initial Vital Signs:  Pulse/HCG Rate: (!) 113  Temp: (!) 97 F (36.1 C) Resp: 16 BP: (!) 96/53 SpO2: 100 %  BMI: Estimated body mass index is 23.3 kg/m as calculated from the following:   Height as of this encounter: 5\' 5"  (1.651 m).   Weight as of this encounter: 140 lb (63.5 kg).  Risk Assessment: Allergies: Reviewed. She has No Known Allergies.  Allergy Precautions: None required Coagulopathies: Reviewed. None identified.  Blood-thinner therapy: None at this time Active Infection(s): Reviewed. None identified. Loretta Webb is afebrile  Site Confirmation: Loretta Webb was asked to confirm the procedure and laterality before marking the site Procedure checklist: Completed Consent: Before the procedure and under the influence of no sedative(s), amnesic(s), or anxiolytics, the patient was informed of the treatment options, risks and possible complications. To fulfill our ethical and legal obligations, as recommended by the American Medical Association's Code of Ethics, I have informed the patient of my clinical impression; the nature and purpose of the treatment or procedure; the risks, benefits, and possible complications of the intervention; the alternatives, including doing nothing; the risk(s) and benefit(s) of the alternative treatment(s) or procedure(s); and the risk(s) and benefit(s) of doing nothing. The patient was provided information about the general risks and possible complications associated with the procedure. These may include, but are not limited to: failure to achieve desired goals, infection, bleeding, organ or nerve damage, allergic reactions, paralysis, and death. In addition, the patient was informed of those risks and complications associated to the procedure, such as failure to decrease pain; infection; bleeding;  organ or nerve damage with subsequent damage to sensory, motor, and/or autonomic systems, resulting in permanent pain, numbness, and/or  weakness of one or several areas of the body; allergic reactions; (i.e.: anaphylactic reaction); and/or death. Furthermore, the patient was informed of those risks and complications associated with the medications. These include, but are not limited to: allergic reactions (i.e.: anaphylactic or anaphylactoid reaction(s)); adrenal axis suppression; blood sugar elevation that in diabetics may result in ketoacidosis or comma; water retention that in patients with history of congestive heart failure may result in shortness of breath, pulmonary edema, and decompensation with resultant heart failure; weight gain; swelling or edema; medication-induced neural toxicity; particulate matter embolism and blood vessel occlusion with resultant organ, and/or nervous system infarction; and/or aseptic necrosis of one or more joints. Finally, the patient was informed that Medicine is not an exact science; therefore, there is also the possibility of unforeseen or unpredictable risks and/or possible complications that may result in a catastrophic outcome. The patient indicated having understood very clearly. We have given the patient no guarantees and we have made no promises. Enough time was given to the patient to ask questions, all of which were answered to the patient's satisfaction. Loretta Webb has indicated that she wanted to continue with the procedure. Attestation: I, the ordering provider, attest that I have discussed with the patient the benefits, risks, side-effects, alternatives, likelihood of achieving goals, and potential problems during recovery for the procedure that I have provided informed consent. Date  Time: 06/24/2020  7:51 AM  Pre-Procedure Preparation:  Monitoring: As per clinic protocol. Respiration, ETCO2, SpO2, BP, heart rate and rhythm monitor placed and checked for  adequate function Safety Precautions: Patient was assessed for positional comfort and pressure points before starting the procedure. Time-out: I initiated and conducted the "Time-out" before starting the procedure, as per protocol. The patient was asked to participate by confirming the accuracy of the "Time Out" information. Verification of the correct person, site, and procedure were performed and confirmed by me, the nursing staff, and the patient. "Time-out" conducted as per Joint Commission's Universal Protocol (UP.01.01.01). Time: 9924  Description of Procedure:          Target Area: Inferior, posterior, aspect of the sacroiliac fissure Approach: Posterior, paraspinal, ipsilateral approach. Area Prepped: Entire Lower Lumbosacral Region DuraPrep (Iodine Povacrylex [0.7% available iodine] and Isopropyl Alcohol, 74% w/w) Safety Precautions: Aspiration looking for blood return was conducted prior to all injections. At no point did we inject any substances, as a needle was being advanced. No attempts were made at seeking any paresthesias. Safe injection practices and needle disposal techniques used. Medications properly checked for expiration dates. SDV (single dose vial) medications used. Description of the Procedure: Protocol guidelines were followed. The patient was placed in position over the procedure table. The target area was identified and the area prepped in the usual manner. Skin & deeper tissues infiltrated with local anesthetic. Appropriate amount of time allowed to pass for local anesthetics to take effect. The procedure needle was advanced under fluoroscopic guidance into the sacroiliac joint until a firm endpoint was obtained. Proper needle placement secured. Negative aspiration confirmed. Solution injected in intermittent fashion, asking for systemic symptoms every 0.5cc of injectate. The needles were then removed and the area cleansed, making sure to leave some of the prepping solution  back to take advantage of its long term bactericidal properties. Vitals:   06/24/20 0845 06/24/20 0850 06/24/20 0855 06/24/20 0900  BP: (!) 128/109 121/85 (!) 131/103 (!) 138/99  Pulse: (!) 128 (!) 140 (!) 135 (!) 132  Resp: (!) 24 (!) 26 (!) 26 (!) 25  Temp:      TempSrc:      SpO2: 97% 95% 96% 98%  Weight:      Height:        Start Time: 0838 hrs. End Time: 0859 hrs. Materials:  Needle(s) Type: Spinal Needle Gauge: 22G Length: 3.5-in Medication(s): Please see orders for medications and dosing details. 4 cc solution made of 3 cc of 0.2% ropivacaine, 1 cc of methylprednisolone, 40 mg/cc.  Injected into the left SI joint after contrast confirmation. Imaging Guidance (Non-Spinal):          Type of Imaging Technique: Fluoroscopy Guidance (Non-Spinal) Indication(s): Assistance in needle guidance and placement for procedures requiring needle placement in or near specific anatomical locations not easily accessible without such assistance. Exposure Time: Please see nurses notes. Contrast: Before injecting any contrast, we confirmed that the patient did not have an allergy to iodine, shellfish, or radiological contrast. Once satisfactory needle placement was completed at the desired level, radiological contrast was injected. Contrast injected under live fluoroscopy. No contrast complications. See chart for type and volume of contrast used. Fluoroscopic Guidance: I was personally present during the use of fluoroscopy. "Tunnel Vision Technique" used to obtain the best possible view of the target area. Parallax error corrected before commencing the procedure. "Direction-depth-direction" technique used to introduce the needle under continuous pulsed fluoroscopy. Once target was reached, antero-posterior, oblique, and lateral fluoroscopic projection used confirm needle placement in all planes. Images permanently stored in EMR. Interpretation: I personally interpreted the imaging intraoperatively.  Adequate needle placement confirmed in multiple planes. Appropriate spread of contrast into desired area was observed. No evidence of afferent or efferent intravascular uptake. Permanent images saved into the patient's record.  Post-operative Assessment:  Post-procedure Vital Signs:  Pulse/HCG Rate: (!) 132 (irreg HR)  Temp: (!) 97 F (36.1 C) Resp: (!) 25 BP: (!) 138/99 SpO2: 98 %  EBL: None  Complications: No immediate post-treatment complications observed by team, or reported by patient.  Note: The patient tolerated the entire procedure well. A repeat set of vitals were taken after the procedure and the patient was kept under observation following institutional policy, for this type of procedure. Post-procedural neurological assessment was performed, showing return to baseline, prior to discharge. The patient was provided with post-procedure discharge instructions, including a section on how to identify potential problems. Should any problems arise concerning this procedure, the patient was given instructions to immediately contact us, at any time, without hesitation. In any case, we plan to contact the patient by telephone for a follow-up status report regarding this interventional procedure.  Comments:  No additional relevant information.  Plan of Care  Orders:  Orders Placed This Encounter  Procedures  . DG PAIN CLINIC C-ARM 1-60 MIN NO REPORT    Intraoperative interpretation by procedural physician at [facility].    Standing Status:   Standing    Number of Occurrences:   1    Order Specific Question:   Reason for exam:    Answer:   Assistance in needle guidance and placement for procedures requiring needle placement in or near specific anatomical locations not easily accessible without such assistance.   Medications ordered for procedure: Meds ordered this encounter  Medications  . iohexol (OMNIPAQUE) 180 MG/ML injection 10 mL    Must be Myelogram-compatible. If  not available, you may substitute with a water-soluble, non-ionic, hypoallergenic, myelogram-compatible radiological contrast medium.  Marland Kitchen. lidocaine (XYLOCAINE) 2 % (with pres) injection 400 mg  . dexamethasone (DECADRON) injection 10 mg  .  ropivacaine (PF) 2 mg/mL (0.2%) (NAROPIN) injection 9 mL  . methylPREDNISolone acetate (DEPO-MEDROL) injection 40 mg   Medications administered: We administered iohexol, lidocaine, dexamethasone, ropivacaine (PF) 2 mg/mL (0.2%), and methylPREDNISolone acetate.  See the medical record for exact dosing, route, and time of administration.  Follow-up plan:   Return in about 4 weeks (around 07/22/2020) for Post Procedure Evaluation, virtual.    Recent Visits Date Type Provider Dept  06/04/20 Telemedicine Edward Jolly, MD [facility]  04/27/20 Procedure visit Edward Jolly, MD [facility]  04/06/20 Office Visit Edward Jolly, MD [facility]  Showing recent visits within past 90 days and meeting all other requirements Today's Visits Date Type Provider Dept  06/24/20 Procedure visit Edward Jolly, MD [facility]  Showing today's visits and meeting all other requirements Future Appointments Date Type Provider Dept  07/20/20 Appointment Edward Jolly, MD [facility]  Showing future appointments within next 90 days and meeting all other requirements  Disposition: Discharge home  Discharge (Date  Time): 06/24/2020; 0908 hrs.   Primary Care Physician: [name], MD Location: [facility] Note by: Edward Jolly, MD Date: 06/24/2020; Time: 10:13 AM  Disclaimer:  Medicine is not an exact science. The only guarantee in medicine is that nothing is guaranteed. It is important to note that the decision to proceed with this intervention was based on the information collected from the patient. The Data and conclusions were drawn from the patient's questionnaire, the interview, and the  physical examination. Because the information was provided in large part by the patient, it cannot be guaranteed that it has not been purposely or unconsciously manipulated. Every effort has been made to obtain as much relevant data as possible for this evaluation. It is important to note that the conclusions that lead to this procedure are derived in large part from the available data. Always take into account that the treatment will also be dependent on availability of resources and existing treatment guidelines, considered by other Pain Management Practitioners as being common knowledge and practice, at the time of the intervention. For Medico-Legal purposes, it is also important to point out that variation in procedural techniques and pharmacological choices are the acceptable norm. The indications, contraindications, technique, and results of the above procedure should only be interpreted and judged by a Board-Certified Interventional Pain Specialist with extensive familiarity and expertise in the same exact procedure and technique.

## 2020-06-25 ENCOUNTER — Telehealth: Payer: Self-pay | Admitting: *Deleted

## 2020-06-25 NOTE — Telephone Encounter (Signed)
Spoke with Eunice Blase, she has not yet spoken with Ms. Solly, she will check on her and call if any problems.

## 2020-07-15 ENCOUNTER — Telehealth: Payer: Self-pay | Admitting: Adult Health Nurse Practitioner

## 2020-07-15 NOTE — Telephone Encounter (Signed)
Spoke with patient's daughter, Loretta Webb, regarding the Palliative referral and she said that patient is currently at her house in [place] and that if she doesn't get any better by tomorrow that she would be taking her to [facility].  Daughter will keep me posted as to what they decide to do and if I do not hear back from her I will f/u with daughter tomorrow afternoon.

## 2020-07-16 ENCOUNTER — Telehealth: Payer: Self-pay | Admitting: Student

## 2020-07-16 ENCOUNTER — Encounter: Payer: Self-pay | Admitting: Student in an Organized Health Care Education/Training Program

## 2020-07-16 ENCOUNTER — Telehealth: Payer: Self-pay

## 2020-07-16 NOTE — Telephone Encounter (Signed)
LM for patient to call office for pre virtual appointment questions.  

## 2020-07-16 NOTE — Telephone Encounter (Signed)
Spoke with patient's daughter, Arbutus Leas, and have scheduled an In-home Palliative Consult for 07/20/20 @ 1:30 PM.

## 2020-07-20 ENCOUNTER — Other Ambulatory Visit: Payer: Medicare Other | Admitting: Student

## 2020-07-20 ENCOUNTER — Other Ambulatory Visit: Payer: Self-pay

## 2020-07-20 ENCOUNTER — Encounter: Payer: Self-pay | Admitting: Student in an Organized Health Care Education/Training Program

## 2020-07-20 ENCOUNTER — Ambulatory Visit
Payer: Medicare Other | Attending: Student in an Organized Health Care Education/Training Program | Admitting: Student in an Organized Health Care Education/Training Program

## 2020-07-20 DIAGNOSIS — M19012 Primary osteoarthritis, left shoulder: Secondary | ICD-10-CM

## 2020-07-20 DIAGNOSIS — M19011 Primary osteoarthritis, right shoulder: Secondary | ICD-10-CM

## 2020-07-20 DIAGNOSIS — M47818 Spondylosis without myelopathy or radiculopathy, sacral and sacrococcygeal region: Secondary | ICD-10-CM

## 2020-07-20 DIAGNOSIS — G894 Chronic pain syndrome: Secondary | ICD-10-CM | POA: Diagnosis not present

## 2020-07-20 DIAGNOSIS — Z8739 Personal history of other diseases of the musculoskeletal system and connective tissue: Secondary | ICD-10-CM | POA: Diagnosis not present

## 2020-07-20 NOTE — Progress Notes (Signed)
Patient: Loretta Webb  Service Category: E/M  Provider: Gillis Santa, MD  DOB: 05/03/1932  DOS: 07/20/2020  Location: Office  MRN: [id number]  Setting: Ambulatory outpatient  Referring Provider: Rusty Aus, MD  Type: Established Patient  Specialty: Interventional Pain Management  PCP: Rusty Aus, MD  Location: Home  Delivery: TeleHealth     Virtual Encounter - Pain Management PROVIDER NOTE: Information contained herein reflects review and annotations entered in association with encounter. Interpretation of such information and data should be left to medically-trained personnel. Information provided to patient can be located elsewhere in the medical record under "Patient Instructions". Document created using STT-dictation technology, any transcriptional errors that may result from process are unintentional.    Contact & Pharmacy Preferred: [phone] Home: [phone] (home) Mobile: There is no such number on file (mobile). E-mail: [email or web address] [place], [place] [address] Phone: [phone] Fax: [phone]   Pre-screening  Loretta Webb offered "in-person" vs "virtual" encounter. She indicated preferring virtual for this encounter.   Reason COVID-19*  Social distancing based on CDC and AMA recommendations.   I contacted [facility] on 07/20/2020 via video conference.      I clearly identified myself as Gillis Santa, MD. I verified that I was speaking with the correct person using two identifiers (Name: Loretta Webb, and date of birth: Oct 01, 1931).  Consent I sought verbal advanced consent from Loretta Webb for virtual visit interactions. I informed Loretta Webb of possible security and privacy concerns, risks, and limitations associated with providing "not-in-person" medical evaluation and management services. I also informed Loretta Webb of the availability of "in-person" appointments.  Finally, I informed her that there would be a charge for the virtual visit and that she could be  personally, fully or partially, financially responsible for it. Loretta Webb expressed understanding and agreed to proceed.   Historic Elements   Loretta Webb is a 87 y.o. year old, female patient evaluated today after our last contact on 06/24/2020. Loretta Webb  has a past medical history of Arthritis, Chronic fatigue, DM2 (diabetes mellitus, type 2) ([place]), Esophageal stricture, Fibromyalgia, GERD (gastroesophageal reflux disease), Hypertension, Lumbar stenosis, Osteoporosis, and Sleep apnea. She also  has a past surgical history that includes knee replacements; Abdominal hysterectomy; Rotator cuff repair; and Appendectomy. Loretta Webb has a current medication list which includes the following prescription(s): acetaminophen, alprazolam, aspirin, bumetanide, cholecalciferol, vitamin b-12, diclofenac sodium, diltiazem, donepezil, duloxetine, hydrocodone-acetaminophen, losartan, magnesium, metformin, pantoprazole, pravastatin, propranolol, quetiapine, trazodone, apixaban, aspirin, and digoxin. She  reports that she has never smoked. She has never used smokeless tobacco. She reports that she does not drink alcohol and does not use drugs. Loretta Webb has No Known Allergies.   HPI  Today, she is being contacted for a post-procedure assessment.  Post-Procedure Evaluation  Procedure (06/24/2020): Left suprascapular pulsed radiofrequency ablation, left sacroiliac joint injection  Sedation: Please see nurses note.  Effectiveness during initial hour after procedure(Ultra-Short Term Relief): 100 %  Local anesthetic used: Long-acting (4-6 hours) Effectiveness: Defined as any analgesic benefit obtained secondary to the administration of local anesthetics. This carries significant diagnostic value as to the etiological location, or anatomical origin, of the pain. Duration of benefit is expected to coincide with the  duration of the local anesthetic used.  Effectiveness during initial 4-6 hours after procedure(Short-Term Relief): 100 %   Long-term benefit: Defined as any relief past the pharmacologic duration of  the local anesthetics.  Effectiveness past the initial 6 hours after procedure(Long-Term Relief): 100 % for left shoulder, 40% pain relief for left SI-J pain  Current benefits: Defined as benefit that persist at this time.   Analgesia:  >75% relief for left shoulder, 30-35% for left SI-J Function: Loretta Webb reports improvement in function ROM: Loretta Webb reports improvement in ROM   Laboratory Chemistry Profile   Renal Lab Results  Component Value Date   BUN 14 09/03/2019   CREATININE 1.44 (H) 09/03/2019   BCR 10 (L) 09/03/2019   GFRAA 38 (L) 09/03/2019   GFRNONAA 33 (L) 09/03/2019     Hepatic Lab Results  Component Value Date   AST 12 09/03/2019   ALT 10 (L) 11/25/2016   ALBUMIN 4.3 09/03/2019   ALKPHOS 50 09/03/2019   LIPASE 42 09/11/2015     Electrolytes Lab Results  Component Value Date   NA 140 09/03/2019   K 4.1 09/03/2019   CL 104 09/03/2019   CALCIUM 9.8 09/03/2019   MG 2.0 09/03/2019     Bone Lab Results  Component Value Date   25OHVITD1 39 09/03/2019   25OHVITD2 3.4 09/03/2019   25OHVITD3 36 09/03/2019     Inflammation (CRP: Acute Phase) (ESR: Chronic Phase) Lab Results  Component Value Date   CRP <1 09/03/2019   ESRSEDRATE 17 09/03/2019       Note: Above Lab results reviewed.   Assessment  The primary encounter diagnosis was Localized osteoarthritis of shoulder regions, bilateral. Diagnoses of History of rotator cuff syndrome (bilateral), SI joint arthritis, and Chronic pain syndrome were also pertinent to this visit.  Plan of Care     I spoke with Loretta Webb, Loretta Webb's daughter today.  Loretta Webb had a decline in her neurological and psychological status last week.  She is seeing Dr. Manuella Ghazi later this afternoon for tremors.  She has responded  favorably after her left suprascapular nerve pulsed radiofrequency ablation.  Ms. Kemler tells Loretta Webb that her left shoulder pain has improved and she does have improved range of motion.  For her right shoulder pain can also consider right suprascapular nerve pulsed radiofrequency ablation.  Regards to her low back and left buttock pain, patient had a moderate response after left sacroiliac joint injection.  I discussed with Loretta Webb that her pain in this region is likely multifactorial from lumbar spinal stenosis, lumbar facet arthropathy, hip degeneration and left sacroiliac joint dysfunction.  Loretta Webb states that she will contact us when Ms. Piatt has an increase in her pain.  At this time she would like to focus on her psychological wellbeing as this is more concerning at this time.  She has an appointment with Dr. Jake Michaelis in the upcoming weeks.   Follow-up plan:   Return if symptoms worsen or fail to improve.   Recent Visits Date Type Provider Dept  06/24/20 Procedure visit Gillis Santa, MD [facility]  06/04/20 Telemedicine Gillis Santa, MD [facility]  04/27/20 Procedure visit Gillis Santa, MD [facility]  Showing recent visits within past 90 days and meeting all other requirements Today's Visits Date Type Provider Dept  07/20/20 Telemedicine Gillis Santa, MD [facility]  Showing today's visits and meeting all other requirements Future Appointments No visits were found meeting these conditions. Showing future appointments within next 90 days and meeting all other requirements  I discussed the assessment and treatment plan with the patient. The patient was provided an opportunity to ask questions and all were answered.  The patient agreed with the plan and demonstrated an understanding of the instructions.  Patient advised to call back or seek an in-person evaluation if the symptoms or condition worsens.  Duration of encounter:  63mnutes.  Note by: BGillis Santa MD Date: 07/20/2020; Time: 2:04 PM

## 2020-07-21 ENCOUNTER — Telehealth: Payer: Self-pay

## 2020-07-27 ENCOUNTER — Telehealth: Payer: Self-pay

## 2020-07-27 NOTE — Telephone Encounter (Signed)
Patient's daughter called to reschedule consult appointment from 3/25 with Latoya to 3/24 with Amy. Patient will be going back to her home in [place] this week.

## 2020-07-30 ENCOUNTER — Other Ambulatory Visit: Payer: Self-pay

## 2020-07-30 ENCOUNTER — Other Ambulatory Visit: Payer: Medicare Other | Admitting: Adult Health Nurse Practitioner

## 2020-07-30 DIAGNOSIS — M47818 Spondylosis without myelopathy or radiculopathy, sacral and sacrococcygeal region: Secondary | ICD-10-CM

## 2020-07-30 DIAGNOSIS — F03A0 Unspecified dementia, mild, without behavioral disturbance, psychotic disturbance, mood disturbance, and anxiety: Secondary | ICD-10-CM

## 2020-07-30 DIAGNOSIS — F0390 Unspecified dementia without behavioral disturbance: Secondary | ICD-10-CM

## 2020-07-30 DIAGNOSIS — G8929 Other chronic pain: Secondary | ICD-10-CM

## 2020-07-30 DIAGNOSIS — F339 Major depressive disorder, recurrent, unspecified: Secondary | ICD-10-CM

## 2020-07-30 DIAGNOSIS — F419 Anxiety disorder, unspecified: Secondary | ICD-10-CM

## 2020-07-30 DIAGNOSIS — Z8739 Personal history of other diseases of the musculoskeletal system and connective tissue: Secondary | ICD-10-CM

## 2020-07-30 NOTE — Progress Notes (Signed)
Therapist, nutritional Palliative Care Consult Note Telephone: [phone]  Fax: [phone]  PATIENT NAME: Loretta Webb DOB: [birth year] MRN: [id number]  PRIMARY CARE PROVIDER:   [name], MD  REFERRING PROVIDER:  [name], MD [phone] [facility] [address] [facility] [place],  [place] [address]  RESPONSIBLE PARTY:   Loretta Webb, daughter/HC POA [phone]  Chief complaint: Initial palliative visit for complex medical decision making   Daughter, Loretta Webb, and in-home caregiver present during visit today.  RECOMMENDATIONS and PLAN:  1.  Advanced care planning.  Patient expresses today that she wants to go naturally and would not like CPR performed and that she would not like life-prolonging measures.  A few weeks ago the discussion in with family and nurse evaluating for in-home care with aids, she indicated that she would want CPR performed.  Have encouraged to continue this conversation with other family members before filling out DNR or MOST form.  Left blank MOST form for patient and family to review together.    2.  Chronic pain.  Patient has chronic pain related to multiple factors such as arthritis, lumbar stenosis, rotator cuff syndrome.  She is being managed through pain clinic.  Has had recent left suprascapular pulsed radiofrequency ablation and left sacroiliac joint injection.  She is getting relief with these procedures.  Continue follow-up and recommendations through pain clinic.  3.  Mild dementia.  Patient recently started on Aricept.  She is being followed by neurology.  Continue follow-up and recommendations by neurology  4.  Depression/anxiety.  Patient is on Cymbalta 60 mg which she has been on for quite some time.  She has recently been started on Seroquel at 25 mg in the a.m. and 50 mg at night.  Continue to monitor for effectiveness  Palliative will continue to monitor for symptom management/decline and make  recommendations as needed.  Follow-up appointment in 4 weeks.  Encouraged to call with any questions or concerns.  I spent 60 minutes providing this consultation, including time spent with patient/family, provider coordination, chart review, documentation. More than 50% of the time in this consultation was spent coordinating communication.    HISTORY OF PRESENT ILLNESS:  Loretta Webb is a 88 y.o. year old female with multiple medical problems including mild dementia, anxiety, depression, OA, HTN, DMT2 diet controlled, esophageal stricture, sleep apnea, rotator cuff syndrome, lumbar stenosis, osteoporosis. Palliative Care was asked to help address goals of care.  Reviewed patient's EMR including most recent labs and imaging.  Patient lives at home and has just recently started having in-home caregivers during the day and on weekends.  Family members come often to stay with her.  Patient recently had a few weeks where she stayed with her daughter due to increased anxiety and depression.  She is doing better and is back at home.  Patient is able to ambulate unassisted throughout her home but is encouraged to use her cane for steadiness.  Daughter does state that she had a fall about 1-1/2 months ago with no injury.  Patient does wear a bracelet similar to life alert and was able to use it when she could not get herself back up from the fall.  Patient is able to bathe and dress herself.  Though she does need assistance getting in and out of the tub for showering and someone stays close by while she sat showering in case she has any episodes of dizziness.  Patient is continent of bowel and  bladder.  Patient does have occasional constipation and takes MiraLAX daily.  It has been more than 2 days since a bowel movement she will take Dulcolax and magnesium citrate as needed.  Patient's diabetes is well controlled with diet.  Patient's appetite is good though has been eating smaller portion sizes.  In November 2021  she weighed 154 pounds with BMI of 28.17.  Current weight is 148 pounds with BMI of 27.07.  Patient has recent diagnosis of mild dementia and was started on Aricept.  She does take trazodone for sleep.  Was having increased depression and anxiety with difficulty sleeping and was started on Seroquel 25 mg in the morning and 50 mg at night.  So far this seems to be helping and she is sleeping better throughout the night.  Rest of 10 point ROS asked and negative.  CODE STATUS: see above  PPS: 60% HOSPICE ELIGIBILITY/DIAGNOSIS: TBD  PHYSICAL EXAM:  BP 124/78 HR 72 O2 97% on room air General: NAD, frail appearing Eyes: Sclera anicteric and noninjected with no discharge noted ENMT: Moist mucous membranes Cardiovascular: regular rate and rhythm Pulmonary: Lung sounds clear; normal respiratory effort Abdomen: soft, nontender, + bowel sounds Extremities: no edema, no joint deformities Skin: no rashes on exposed skin Neurological: Weakness but otherwise nonfocal   Family History  Problem Relation Age of Onset  . Diabetes Father   . Cancer Father   . Cerebral aneurysm Father   . Cancer Mother   . Cancer Sister   . Depression Sister       PAST MEDICAL HISTORY:  Past Medical History:  Diagnosis Date  . Arthritis   . Chronic fatigue   . DM2 (diabetes mellitus, type 2) (HCC)   . Esophageal stricture   . Fibromyalgia   . GERD (gastroesophageal reflux disease)   . Hypertension   . Lumbar stenosis   . Osteoporosis   . Sleep apnea     SOCIAL HX:  Social History   Tobacco Use  . Smoking status: Never Smoker  . Smokeless tobacco: Never Used  Substance Use Topics  . Alcohol use: No    Alcohol/week: 0.0 standard drinks    ALLERGIES: No Known Allergies   PERTINENT MEDICATIONS:  Outpatient Encounter Medications as of 07/30/2020  Medication Sig  . acetaminophen (TYLENOL) 500 MG tablet Take 500 mg by mouth 2 (two) times daily as needed.  . ALPRAZolam (XANAX) 0.25 MG tablet Take  0.25 mg by mouth.  Marland Kitchen apixaban (ELIQUIS) 2.5 MG TABS tablet Take 5 mg by mouth 2 (two) times daily. (Patient not taking: No sig reported)  . aspirin 81 MG chewable tablet Chew by mouth daily.  Marland Kitchen aspirin 81 MG chewable tablet Chew by mouth daily. (Patient not taking: No sig reported)  . bumetanide (BUMEX) 1 MG tablet Take 1 mg by mouth daily.   . Cholecalciferol 25 MCG (1000 UT) tablet Take 1,000 Units by mouth once a week.  . Cyanocobalamin (VITAMIN B-12) 5000 MCG TBDP Take 5,000 mcg by mouth daily.  . diclofenac Sodium (VOLTAREN) 1 % GEL Apply topically 4 (four) times daily.  . digoxin (LANOXIN) 0.125 MG tablet Take by mouth daily. (Patient not taking: No sig reported)  . diltiazem (CARDIZEM) 120 MG tablet Take 120 mg by mouth 4 (four) times daily as needed (rapid atrial fib).  . donepezil (ARICEPT) 5 MG tablet Take 5 mg by mouth at bedtime.  . DULoxetine (CYMBALTA) 60 MG capsule Take 60 mg by mouth daily.   Marland Kitchen HYDROcodone-acetaminophen (NORCO/VICODIN)  5-325 MG tablet Take 1 tablet by mouth every 6 (six) hours as needed for moderate pain.  Marland Kitchen losartan (COZAAR) 50 MG tablet Take 50 mg by mouth daily.  . Magnesium 250 MG TABS Take 250 mg by mouth daily.  . metFORMIN (GLUCOPHAGE) 1000 MG tablet Take 1,000 mg by mouth 2 (two) times daily with a meal.  . pantoprazole (PROTONIX) 20 MG tablet Take 20 mg by mouth daily.  . pravastatin (PRAVACHOL) 40 MG tablet Take 40 mg by mouth at bedtime.   . propranolol (INDERAL) 20 MG tablet Take 20 mg by mouth 2 (two) times daily.  . QUEtiapine (SEROQUEL) 25 MG tablet Take 25 mg by mouth 2 (two) times daily.  . traZODone (DESYREL) 50 MG tablet Take 100 mg by mouth at bedtime.    No facility-administered encounter medications on file as of 07/30/2020.     Tongela Encinas Marlena Clipper, NP

## 2020-07-31 ENCOUNTER — Other Ambulatory Visit: Payer: Medicare Other | Admitting: Student

## 2020-08-05 ENCOUNTER — Encounter: Payer: Self-pay | Admitting: Student in an Organized Health Care Education/Training Program

## 2020-08-05 DIAGNOSIS — M19011 Primary osteoarthritis, right shoulder: Secondary | ICD-10-CM

## 2020-08-05 DIAGNOSIS — Z8739 Personal history of other diseases of the musculoskeletal system and connective tissue: Secondary | ICD-10-CM

## 2020-08-05 DIAGNOSIS — M19012 Primary osteoarthritis, left shoulder: Secondary | ICD-10-CM

## 2020-08-05 DIAGNOSIS — G8929 Other chronic pain: Secondary | ICD-10-CM

## 2020-08-05 DIAGNOSIS — M25511 Pain in right shoulder: Secondary | ICD-10-CM

## 2020-08-06 NOTE — Telephone Encounter (Signed)
As needed order placed for suprascapular nerve block.  Please call patient and let her know

## 2020-08-10 ENCOUNTER — Telehealth: Payer: Self-pay | Admitting: Adult Health Nurse Practitioner

## 2020-08-10 NOTE — Telephone Encounter (Signed)
Called daughter to answer some questions about witnessing HCPOA.  Left VM with reason for call and call back info. Zarek Relph K. Garner Nash NP

## 2020-08-20 ENCOUNTER — Encounter: Payer: Self-pay | Admitting: Adult Health Nurse Practitioner

## 2020-08-20 ENCOUNTER — Other Ambulatory Visit: Payer: Self-pay

## 2020-08-20 ENCOUNTER — Telehealth: Payer: Self-pay | Admitting: Student in an Organized Health Care Education/Training Program

## 2020-08-20 ENCOUNTER — Other Ambulatory Visit: Payer: Medicare Other | Admitting: Adult Health Nurse Practitioner

## 2020-08-20 VITALS — BP 132/60 | HR 84

## 2020-08-20 DIAGNOSIS — M25511 Pain in right shoulder: Secondary | ICD-10-CM

## 2020-08-20 DIAGNOSIS — F0390 Unspecified dementia without behavioral disturbance: Secondary | ICD-10-CM

## 2020-08-20 DIAGNOSIS — K5900 Constipation, unspecified: Secondary | ICD-10-CM

## 2020-08-20 DIAGNOSIS — F03A0 Unspecified dementia, mild, without behavioral disturbance, psychotic disturbance, mood disturbance, and anxiety: Secondary | ICD-10-CM

## 2020-08-20 DIAGNOSIS — G8929 Other chronic pain: Secondary | ICD-10-CM

## 2020-08-20 NOTE — Telephone Encounter (Signed)
Please call patient to schedule.

## 2020-08-20 NOTE — Progress Notes (Signed)
[facility]er[facility] Palliative Care Consult Note Telephone: [phone]  Fax: [phone]    Date of encounter: 08/20/20 PATIENT NAME: Loretta Webb Loretta Webb [id number]   [phone] (home)  DOB: 09-11-1931 MRN: [id number] PRIMARY CARE PROVIDER:    Rusty Aus, MD,  [place] [address] [phone]  REFERRING PROVIDER:   Rusty Aus, MD [facility] [place],  [place] [address] [phone]  RESPONSIBLE PARTY:    Contact Information    Name Relation Home Work Mobile   Lockery,Debbie Daughter [phone]     Alferd Apa [phone]         I met face to face with patient and family in home. Palliative Care was asked to follow this patient by consultation request of  Rusty Aus, MD to address advance care planning and complex medical decision making. This is a follow up visit.  Daughter, Loretta Webb, present during visit today.                                   ASSESSMENT AND PLAN / RECOMMENDATIONS:   Advance Care Planning/Goals of Care: Goals include to maximize quality of life and symptom management. Patient and daughter endorse that she wishes to be full code and has living will.  Encouraged to make sure provider has copy of living will  CODE STATUS: full code  Symptom Management/Plan:  Shoulder pain: Given that there has been no trauma to the area pain is most likely nerve related.  Recommended reaching out to pain management to set up appointment for recommended nerve block  Dementia: Patient is doing very well independently at this time.  She does have occasional caregivers to come into the home that help her.  Though she is resistant to the caregivers coming into her home recommend still continuing with the caregivers as her dementia will eventually worsen.  Though overall patient is doing very well and is  remembering to take her medications has meals prepared by her daughter that she can easily heat up and eat.  She is consistently doing her exercises and being active by walking.  Palliative will continue to monitor for any future safety concerns.   Follow up Palliative Care Visit: Palliative care will continue to follow for complex medical decision making, advance care planning, and clarification of goals. Return 6 weeks or prn.  Encouraged to call with any questions or concerns  I spent 60 minutes providing this consultation. More than 50% of the time in this consultation was spent in counseling and care coordination.  PPS: 60%  HOSPICE ELIGIBILITY/DIAGNOSIS: TBD   HISTORY OF PRESENT ILLNESS:  Loretta Webb is a 87 y.o. year old female  with mild dementia, anxiety, depression, OA, HTN, DMT2 diet controlled, esophageal stricture, sleep apnea, rotator cuff syndrome, lumbar stenosis, osteoporosis.  Patient states today having left shoulder pain she describes as an 8 out of 10 on the pain scale.  She denies any falls or trauma.  She has tried Tylenol, Voltaren gel, Biofreeze with little relief.  The pain does keep her up at night.  In the past she has had 2 nerve blocks and ablation done to the shoulder.  On 08/06/2020 she had appointment with PCP who gave her cortisone shot in the shoulder which she states is not giving her any relief.  She  does have significant ecchymosis on the left upper arm after the cortisone shot.  Daughter has reached out to her mother's pain doctor, Dr. Zollie Scale, and he has recommended a nerve block.  Patient states her appetite is good and weight is stable between 135 and 140 pounds.  Patient not currently working with PT but she does walk and do the exercises on the sheets that were left with her.  Patient states that she has been walking with her walker down her steep driveway and into her backyard.  Patient has occasional constipation and does have stool softener and laxatives  that help give her relief when needed.  Patient does have life alert type bracelet for emergencies.  Patient is managing her medications by herself well and has reminders.  Rest of 10 point ROS asked and negative except what is stated in HPI.  History obtained from review of EMR and interview with family and Loretta Webb.    Physical Exam: Constitutional: NAD General: WNWD EYES: anicteric sclera, lids intact, no discharge  ENMT: intact hearing, oral mucous membranes moist CV: S1S2, RRR, no LE edema Pulmonary: LCTA, no increased work of breathing, no cough Abdomen: normo-active BS + 4 quadrants, soft and non tender, no ascites MSK: no sarcopenia, moves all extremities, ambulatory Skin: warm and dry, no rashes or wounds on visible skin Neuro:  A and O x 3 Hem/lymph/immuno: no widespread bruising   Thank you for the opportunity to participate in the care of Loretta Webb.  The palliative care team will continue to follow. Please call our office at [phone] if we can be of additional assistance.   Loretta Webb Jenetta Downer, NP , DNP  This chart was dictated using voice recognition software. Despite best efforts to proofread, errors can occur which can change the documentation meaning.   COVID-19 PATIENT SCREENING TOOL Asked and negative response unless otherwise noted:   Have you had symptoms of covid, tested positive or been in contact with someone with symptoms/positive test in the past 5-10 days? negative

## 2020-08-20 NOTE — Telephone Encounter (Signed)
I don't see that arm pain has been addressed by our office. Do you wish to order procedure, or eval appt? Or other options?

## 2020-08-20 NOTE — Telephone Encounter (Signed)
She has a as needed order in place for a suprascapular nerve block.  Please use that.

## 2020-08-20 NOTE — Telephone Encounter (Signed)
Patient daughter Loretta Webb called stating patient has pain in left arm, has had cortisone shot from Dr. Hyacinth Meeker. Has not helped. Would like to come in for procedure from Dr. Cherylann Ratel.

## 2020-08-24 NOTE — Telephone Encounter (Signed)
Patient is scheduled on Wed 08-26-20 at 10:40. Daughter states the cortizone shot did not help at all and has left a really nasty bruise on the back of Loretta Webb arm. She wanted to make sure Dr. Cherylann Ratel know this. Can call if have any questions.

## 2020-08-26 ENCOUNTER — Encounter: Payer: Self-pay | Admitting: Student in an Organized Health Care Education/Training Program

## 2020-08-26 ENCOUNTER — Other Ambulatory Visit: Payer: Self-pay

## 2020-08-26 ENCOUNTER — Ambulatory Visit (HOSPITAL_BASED_OUTPATIENT_CLINIC_OR_DEPARTMENT_OTHER): Payer: Medicare Other | Admitting: Student in an Organized Health Care Education/Training Program

## 2020-08-26 ENCOUNTER — Ambulatory Visit
Admission: RE | Admit: 2020-08-26 | Discharge: 2020-08-26 | Disposition: A | Discharge status: Home / Self Care | Payer: Medicare Other | Source: Ambulatory Visit | Attending: Student in an Organized Health Care Education/Training Program | Admitting: Student in an Organized Health Care Education/Training Program

## 2020-08-26 VITALS — BP 130/91 | HR 128 | Temp 96.9°F | Resp 20 | Ht 62.0 in | Wt 135.0 lb

## 2020-08-26 DIAGNOSIS — Z8739 Personal history of other diseases of the musculoskeletal system and connective tissue: Secondary | ICD-10-CM | POA: Insufficient documentation

## 2020-08-26 DIAGNOSIS — M19012 Primary osteoarthritis, left shoulder: Secondary | ICD-10-CM

## 2020-08-26 DIAGNOSIS — M19011 Primary osteoarthritis, right shoulder: Secondary | ICD-10-CM | POA: Insufficient documentation

## 2020-08-26 DIAGNOSIS — G8929 Other chronic pain: Secondary | ICD-10-CM

## 2020-08-26 DIAGNOSIS — M25512 Pain in left shoulder: Secondary | ICD-10-CM

## 2020-08-26 DIAGNOSIS — M25511 Pain in right shoulder: Secondary | ICD-10-CM | POA: Diagnosis present

## 2020-08-26 MED ORDER — ROPIVACAINE HCL 2 MG/ML IJ SOLN
4.0000 mL | Freq: Once | INTRAMUSCULAR | Status: AC
  Administered 2020-08-26: 10 mL via INTRA_ARTICULAR
  Filled 2020-08-26: qty 10

## 2020-08-26 MED ORDER — IOHEXOL 180 MG/ML  SOLN
INTRAMUSCULAR | Status: AC
  Filled 2020-08-26: qty 20

## 2020-08-26 MED ORDER — LIDOCAINE HCL (PF) 2 % IJ SOLN
INTRAMUSCULAR | Status: AC
  Filled 2020-08-26: qty 10

## 2020-08-26 MED ORDER — IOHEXOL 180 MG/ML  SOLN
10.0000 mL | Freq: Once | INTRAMUSCULAR | Status: AC
  Administered 2020-08-26: 10 mL via INTRA_ARTICULAR

## 2020-08-26 MED ORDER — DEXAMETHASONE SODIUM PHOSPHATE 10 MG/ML IJ SOLN
INTRAMUSCULAR | Status: AC
  Filled 2020-08-26: qty 1

## 2020-08-26 MED ORDER — DEXAMETHASONE SODIUM PHOSPHATE 10 MG/ML IJ SOLN
10.0000 mg | Freq: Once | INTRAMUSCULAR | Status: AC
  Administered 2020-08-26: 10 mg

## 2020-08-26 MED ORDER — LIDOCAINE HCL 2 % IJ SOLN
20.0000 mL | Freq: Once | INTRAMUSCULAR | Status: AC
  Administered 2020-08-26: 200 mg

## 2020-08-26 NOTE — Progress Notes (Signed)
PROVIDER NOTE: Information contained herein reflects review and annotations entered in association with encounter. Interpretation of such information and data should be left to medically-trained personnel. Information provided to patient can be located elsewhere in the medical record under "Patient Instructions". Document created using STT-dictation technology, any transcriptional errors that may result from process are unintentional.    Patient: Loretta Webb  Service Category: Procedure  Provider: Edward Jolly, MD  DOB: 10/19/1931  DOS: 08/26/2020  Location: [facility]  MRN: [id number]  Setting: Ambulatory - outpatient  Referring Provider: [name], MD  Type: Established Patient  Specialty: Interventional Pain Management  PCP: [name], MD   Primary Reason for Visit: Interventional Pain Management Treatment. CC: Shoulder Pain (left)  Procedure:          Anesthesia, Analgesia, Anxiolysis:  Type: Therapeutic Suprascapular nerve Block  Primary Purpose: Diagnostic/Therapeutric Region: Posterior Shoulder & Scapular Areas Level: Superior to the scapular spine, in the lateral aspect of the supraspinatus fossa (Suprascapular notch). Target Area: Suprascapular nerve as it passes thru the lower portion of the suprascapular notch. Approach: Posterior percutaneous approach. Laterality: Left-Side  Type: Local Anesthesia  Local Anesthetic: Lidocaine 1-2%  Position: Prone   Indications: 1. Localized osteoarthritis of shoulder regions, bilateral   2. History of rotator cuff syndrome (bilateral)   3. Chronic shoulder pain (3ry area of Pain) (Bilateral) (L>R)    Pain Score: Pre-procedure: 8 /10 Post-procedure: 0 shoulder/10   Pre-op Assessment:  Loretta Webb is a 87 y.o. (year old), female patient, seen today for interventional treatment. She  has a past surgical history that includes knee replacements; Abdominal hysterectomy; Rotator cuff repair; and Appendectomy. [name] has a current medication list which includes the following prescription(s): acetaminophen, alprazolam, aspirin, bumetanide, cholecalciferol, vitamin b-12, diclofenac sodium, diltiazem, donepezil, duloxetine, hydrocodone-acetaminophen, losartan, magnesium, metformin, pantoprazole, propranolol, quetiapine, trazodone, apixaban, aspirin, digoxin, and pravastatin. Her primarily concern today is the Shoulder Pain (left)  Initial Vital Signs:  Pulse/HCG Rate: (!) 128ECG Heart Rate: (!) 123 Temp: (!) 96.9 F (36.1 C) Resp: 16 BP: 116/82 SpO2: 92 %  BMI: Estimated body mass index is 24.69 kg/m as calculated from the following:   Height as of this encounter: 5\' 2"  (1.575 m).   Weight as of this encounter: 135 lb (61.2 kg).  Risk Assessment: Allergies: Reviewed. She has No Known Allergies.  Allergy Precautions: None required Coagulopathies: Reviewed. None identified.  Blood-thinner therapy: None at this time Active Infection(s): Reviewed. None identified. Loretta Webb is afebrile  Site Confirmation: Loretta Webb was asked to confirm the procedure and laterality before marking the site Procedure checklist: Completed Consent: Before the procedure and under the influence of no sedative(s), amnesic(s), or anxiolytics, the patient was informed of the treatment options, risks and possible complications. To fulfill our ethical and legal obligations, as recommended by the American Medical Association's Code of Ethics, I have informed the patient of my clinical impression; the nature and purpose of the treatment or procedure; the risks, benefits, and possible complications of the intervention; the alternatives, including doing nothing; the risk(s) and benefit(s) of the alternative treatment(s) or procedure(s); and the risk(s) and benefit(s) of doing nothing. The patient was provided information about the general risks and possible complications associated with the procedure. These may include, but are not  limited to: failure to achieve desired goals, infection, bleeding, organ or nerve damage, allergic reactions, paralysis, and death. In addition, the patient was informed of those risks and complications associated to the procedure,  such as failure to decrease pain; infection; bleeding; organ or nerve damage with subsequent damage to sensory, motor, and/or autonomic systems, resulting in permanent pain, numbness, and/or weakness of one or several areas of the body; allergic reactions; (i.e.: anaphylactic reaction); and/or death. Furthermore, the patient was informed of those risks and complications associated with the medications. These include, but are not limited to: allergic reactions (i.e.: anaphylactic or anaphylactoid reaction(s)); adrenal axis suppression; blood sugar elevation that in diabetics may result in ketoacidosis or comma; water retention that in patients with history of congestive heart failure may result in shortness of breath, pulmonary edema, and decompensation with resultant heart failure; weight gain; swelling or edema; medication-induced neural toxicity; particulate matter embolism and blood vessel occlusion with resultant organ, and/or nervous system infarction; and/or aseptic necrosis of one or more joints. Finally, the patient was informed that Medicine is not an exact science; therefore, there is also the possibility of unforeseen or unpredictable risks and/or possible complications that may result in a catastrophic outcome. The patient indicated having understood very clearly. We have given the patient no guarantees and we have made no promises. Enough time was given to the patient to ask questions, all of which were answered to the patient's satisfaction. Loretta Webb has indicated that she wanted to continue with the procedure. Attestation: I, the ordering provider, attest that I have discussed with the patient the benefits, risks, side-effects, alternatives, likelihood of achieving  goals, and potential problems during recovery for the procedure that I have provided informed consent. Date  Time: 08/26/2020 10:27 AM  Pre-Procedure Preparation:  Monitoring: As per clinic protocol. Respiration, ETCO2, SpO2, BP, heart rate and rhythm monitor placed and checked for adequate function Safety Precautions: Patient was assessed for positional comfort and pressure points before starting the procedure. Time-out: I initiated and conducted the "Time-out" before starting the procedure, as per protocol. The patient was asked to participate by confirming the accuracy of the "Time Out" information. Verification of the correct person, site, and procedure were performed and confirmed by me, the nursing staff, and the patient. "Time-out" conducted as per Joint Commission's Universal Protocol (UP.01.01.01). Time: 1058  Description of Procedure:          Area Prepped: Entire shoulder Area DuraPrep (Iodine Povacrylex [0.7% available iodine] and Isopropyl Alcohol, 74% w/w) Safety Precautions: Aspiration looking for blood return was conducted prior to all injections. At no point did we inject any substances, as a needle was being advanced. No attempts were made at seeking any paresthesias. Safe injection practices and needle disposal techniques used. Medications properly checked for expiration dates. SDV (single dose vial) medications used. Description of the Procedure: Protocol guidelines were followed. The patient was placed in position over the procedure table. The target area was identified and the area prepped in the usual manner. Skin & deeper tissues infiltrated with local anesthetic. Appropriate amount of time allowed to pass for local anesthetics to take effect. The procedure needles were then advanced to the target area. Proper needle placement secured. Negative aspiration confirmed. Solution injected in intermittent fashion, asking for systemic symptoms every 0.5cc of injectate. The needles were  then removed and the area cleansed, making sure to leave some of the prepping solution back to take advantage of its long term bactericidal properties.  Vitals:   08/26/20 1029 08/26/20 1031 08/26/20 1057 08/26/20 1104  BP: 116/82 133/81 133/81 (!) 130/91  Pulse: (!) 128     Resp: 16 20 20 20   Temp: (!) 96.9 F (36.1  C)     SpO2: 92% 96% 96% 97%  Weight: 135 lb (61.2 kg)     Height: 5\' 2"  (1.575 m)       Start Time: 1058 hrs. End Time: 1104 hrs. Materials:  Needle(s) Type: Spinal Needle Gauge: 25G Length: 3.5-in Medication(s): Please see orders for medications and dosing details. 5 cc solution made of 4 cc of 0.2% ropivacaine, 1 cc of decadron 10mg /cc.  Injected for the LEFTsuprascapular nerve  Imaging Guidance (Non-Spinal):          Type of Imaging Technique: Fluoroscopy Guidance (Non-Spinal) and ultrasound guidance to visualize suprascapular notch and suprascapular artery Indication(s): Assistance in needle guidance and placement for procedures requiring needle placement in or near specific anatomical locations not easily accessible without such assistance. Exposure Time: Please see nurses notes. Contrast: Before injecting any contrast, we confirmed that the patient did not have an allergy to iodine, shellfish, or radiological contrast. Once satisfactory needle placement was completed at the desired level, radiological contrast was injected. Contrast injected under live fluoroscopy. No contrast complications. See chart for type and volume of contrast used. Fluoroscopic Guidance: I was personally present during the use of fluoroscopy. "Tunnel Vision Technique" used to obtain the best possible view of the target area. Parallax error corrected before commencing the procedure. "Direction-depth-direction" technique used to introduce the needle under continuous pulsed fluoroscopy. Once target was reached, antero-posterior, oblique, and lateral fluoroscopic projection used confirm needle  placement in all planes. Images permanently stored in EMR. Interpretation: I personally interpreted the imaging intraoperatively. Adequate needle placement confirmed in multiple planes. Appropriate spread of contrast into desired area was observed. No evidence of afferent or efferent intravascular uptake. Permanent images saved into the patient's record.   Post-operative Assessment:  Post-procedure Vital Signs:  Pulse/HCG Rate: (!) 128(!) 126 Temp: (!) 96.9 F (36.1 C) Resp: 20 BP: (!) 130/91 SpO2: 97 %  EBL: None  Complications: No immediate post-treatment complications observed by team, or reported by patient.  Note: The patient tolerated the entire procedure well. A repeat set of vitals were taken after the procedure and the patient was kept under observation following institutional policy, for this type of procedure. Post-procedural neurological assessment was performed, showing return to baseline, prior to discharge. The patient was provided with post-procedure discharge instructions, including a section on how to identify potential problems. Should any problems arise concerning this procedure, the patient was given instructions to immediately contact , at any time, without hesitation. In any case, we plan to contact the patient by telephone for a follow-up status report regarding this interventional procedure.  Comments:  No additional relevant information.  Plan of Care  Orders:  Orders Placed This Encounter  Procedures  . DG PAIN CLINIC C-ARM 1-60 MIN NO REPORT    Intraoperative interpretation by procedural physician at [facility].    Standing Status:   Standing    Number of Occurrences:   1    Order Specific Question:   Reason for exam:    Answer:   Assistance in needle guidance and placement for procedures requiring needle placement in or near specific anatomical locations not easily accessible without such assistance.    Medications ordered for  procedure: Meds ordered this encounter  Medications  . iohexol (OMNIPAQUE) 180 MG/ML injection 10 mL    Must be Myelogram-compatible. If not available, you may substitute with a water-soluble, non-ionic, hypoallergenic, myelogram-compatible radiological contrast medium.  [place] lidocaine (XYLOCAINE) 2 % (with pres) injection 400 mg  . dexamethasone (  DECADRON) injection 10 mg  . ropivacaine (PF) 2 mg/mL (0.2%) (NAROPIN) injection 4 mL   Medications administered: We administered iohexol, lidocaine, dexamethasone, and ropivacaine (PF) 2 mg/mL (0.2%).  See the medical record for exact dosing, route, and time of administration.  Follow-up plan:   Return in about 5 weeks (around 09/30/2020) for Post Procedure Evaluation, virtual.     Recent Visits Date Type Provider Dept  07/20/20 Telemedicine Edward Jolly, MD [facility]  06/24/20 Procedure visit Edward Jolly, MD [facility]  06/04/20 Telemedicine Edward Jolly, MD [facility]  Showing recent visits within past 90 days and meeting all other requirements Today's Visits Date Type Provider Dept  08/26/20 Procedure visit Edward Jolly, MD [facility]  Showing today's visits and meeting all other requirements Future Appointments No visits were found meeting these conditions. Showing future appointments within next 90 days and meeting all other requirements  Disposition: Discharge home  Discharge (Date  Time): 08/26/2020; 1110 hrs.   Primary Care Physician: [name], MD Location: [facility] Note by: Edward Jolly, MD Date: 08/26/2020; Time: 11:08 AM  Disclaimer:  Medicine is not an exact science. The only guarantee in medicine is that nothing is guaranteed. It is important to note that the decision to proceed with this intervention was based on the information collected from the patient. The Data and conclusions were drawn from the patient's questionnaire, the interview,  and the physical examination. Because the information was provided in large part by the patient, it cannot be guaranteed that it has not been purposely or unconsciously manipulated. Every effort has been made to obtain as much relevant data as possible for this evaluation. It is important to note that the conclusions that lead to this procedure are derived in large part from the available data. Always take into account that the treatment will also be dependent on availability of resources and existing treatment guidelines, considered by other Pain Management Practitioners as being common knowledge and practice, at the time of the intervention. For Medico-Legal purposes, it is also important to point out that variation in procedural techniques and pharmacological choices are the acceptable norm. The indications, contraindications, technique, and results of the above procedure should only be interpreted and judged by a Board-Certified Interventional Pain Specialist with extensive familiarity and expertise in the same exact procedure and technique.

## 2020-08-26 NOTE — Patient Instructions (Signed)

## 2020-08-26 NOTE — Progress Notes (Signed)
Safety precautions to be maintained throughout the outpatient stay will include: orient to surroundings, keep bed in low position, maintain call bell within reach at all times, provide assistance with transfer out of bed and ambulation.  

## 2020-08-27 ENCOUNTER — Telehealth: Payer: Self-pay | Admitting: *Deleted

## 2020-08-27 NOTE — Telephone Encounter (Signed)
Spoke with Debbie,daughter, reports no problems post procedure.

## 2020-09-03 ENCOUNTER — Telehealth: Payer: Self-pay | Admitting: Student in an Organized Health Care Education/Training Program

## 2020-09-03 NOTE — Telephone Encounter (Signed)
Told her that procedure was done on 08/26/20 and the steroids will remain active for 14 days.  Encouraged to use ice or heat and take pain medication as prescribed.  If she has further questions please call.

## 2020-09-03 NOTE — Telephone Encounter (Signed)
Attempted to call Arbutus Leas back to talk about her mom.  No answer, voicemail left.

## 2020-09-28 ENCOUNTER — Telehealth: Payer: Self-pay | Admitting: Student in an Organized Health Care Education/Training Program

## 2020-09-28 ENCOUNTER — Telehealth: Payer: Self-pay | Admitting: Adult Health Nurse Practitioner

## 2020-09-28 DIAGNOSIS — M19012 Primary osteoarthritis, left shoulder: Secondary | ICD-10-CM

## 2020-09-28 DIAGNOSIS — Z8739 Personal history of other diseases of the musculoskeletal system and connective tissue: Secondary | ICD-10-CM

## 2020-09-28 DIAGNOSIS — M47818 Spondylosis without myelopathy or radiculopathy, sacral and sacrococcygeal region: Secondary | ICD-10-CM

## 2020-09-28 DIAGNOSIS — M19011 Primary osteoarthritis, right shoulder: Secondary | ICD-10-CM

## 2020-09-28 NOTE — Telephone Encounter (Signed)
Left message for patient to call office to discuss her previous phone call message.

## 2020-09-28 NOTE — Telephone Encounter (Signed)
Spoke with daughter to remind her of appointment tomorrow Loretta Webb K. Garner Nash NP

## 2020-09-28 NOTE — Telephone Encounter (Signed)
Spoke with patients daughter and she wanted to let you know ahead of time that Loretta Webb is still having bilateral shoulder pain and a tingling pain in her left thigh.  She was hoping maybe the patient could get a procedure that day instead of a follow up.  She comes this Wednesday.  Please advise if you think that a procedure would be appropriate.

## 2020-09-29 ENCOUNTER — Encounter: Payer: Self-pay | Admitting: Adult Health Nurse Practitioner

## 2020-09-29 ENCOUNTER — Other Ambulatory Visit: Payer: Self-pay

## 2020-09-29 ENCOUNTER — Other Ambulatory Visit: Payer: Medicare Other | Admitting: Adult Health Nurse Practitioner

## 2020-09-29 VITALS — BP 128/74 | HR 75

## 2020-09-29 DIAGNOSIS — F03A0 Unspecified dementia, mild, without behavioral disturbance, psychotic disturbance, mood disturbance, and anxiety: Secondary | ICD-10-CM

## 2020-09-29 DIAGNOSIS — F0390 Unspecified dementia without behavioral disturbance: Secondary | ICD-10-CM

## 2020-09-29 DIAGNOSIS — G8929 Other chronic pain: Secondary | ICD-10-CM

## 2020-09-29 DIAGNOSIS — Z515 Encounter for palliative care: Secondary | ICD-10-CM

## 2020-09-29 NOTE — Progress Notes (Signed)
[facility] Consult Note Telephone: [phone]  Fax: [phone]    Date of encounter: 09/29/20 PATIENT NAME: Loretta Webb Loretta Webb Loretta Webb [id number]   [phone] (home)  DOB: 1931/11/20 MRN: [id number] PRIMARY CARE PROVIDER:    Rusty Aus, MD,  Loretta Webb [place] [address] [phone]  REFERRING PROVIDER:   Rusty Aus, MD [facility] [place],  [place] [address] [phone]  RESPONSIBLE PARTY:    Contact Information    Name Relation Home Work Mobile   Loretta Webb,Loretta Webb Daughter [phone]     Loretta Webb [phone]         I met face to face with patient in home. Palliative Care was asked to follow this patient by consultation request of  Loretta Aus, MD to address advance care planning and complex medical decision making. This is a follow up visit.  Spoke with daughter via phone to update on today's visit                                   [place] / RECOMMENDATIONS:   Advance Care Planning/Goals of Care: Goals include to maximize quality of life and symptom management.  CODE STATUS: Full code  Symptom Management/Plan:  Chronic shoulder pain: Patient has appointment with pain provider, Dr. Holley Raring, tomorrow.  Patient unsure if they were going to be doing another procedure tomorrow to help with her shoulder pain.  Discussed with patient and daughter to ask about PT as an option to help with the shoulder pain.  Dementia: Patient is able to independently perform ADLs.  She does have someone who comes in to clean for her family helps prepare meals and takes care of finances. Will try to remember to ask if she is wearing her life alert button and if she remembers how to use it at each visit.  Daughter does express concerns that with her refusing in-home care that she will have another crisis like  a fall and will be forced to find placement in a facility for her mother.  Will continue to assess for safety at each visit.   Follow up Palliative Care Visit: Palliative care will continue to follow for complex medical decision making, advance care planning, and clarification of goals. Return 8 weeks or prn.  Daughter encouraged to call with any questions or concerns  I spent 45 minutes providing this consultation. More than 50% of the time in this consultation was spent in counseling and care coordination.  PPS: 60%  HOSPICE ELIGIBILITY/DIAGNOSIS: TBD  Chief Complaint: Follow-up palliative visit  HISTORY OF PRESENT ILLNESS:  Loretta Webb is a 87 y.o. year old female  with mild dementia, anxiety, depression, OA, HTN, DMT2 diet controlled, esophageal stricture, sleep apnea, rotator cuff syndrome, lumbar stenosis, osteoporosis.  Patient had suprascapular nerve block done on 08/26/2020 for left shoulder pain.  Patient is still having pain in the left shoulder and now also in the right shoulder.  She has having limited range of motion in the shoulders due to her pain.  Patient does state that her allergies have been bothering her this week and she has been taking Zyrtec for this.  At first patient stated that she was having some sleeping issues but then stated that she is on sleep meds and is sleeping fine.  Denies headaches, dizziness,  N/V/D, constipation, dysuria, hematuria.  Denies recent falls.  Daughter does have concerns as her mother has been resistant to in-home care and has sent away the caregivers that she has lined up.  Denies falls and daughter does state that she has a life alert button and does know how to use it and has used it in the past.     History obtained from review of EMR and interview with family and Loretta Webb.    Physical Exam: Constitutional: NAD General: WNWD EYES: anicteric sclera, lids intact, no discharge  ENMT: intact hearing, oral mucous membranes moist CV:  S1S2, RRR, no LE edema Pulmonary: LCTA, no increased work of breathing, no cough Abdomen: normo-active BS + 4 quadrants, soft and non tender MSK: Limited range of motion of bilateral shoulders, ambulatory Skin: warm and dry, no rashes on visible skin Neuro:  A and O x 3 Hem/lymph/immuno: no widespread bruising  Thank you for the opportunity to participate in the care of Loretta Webb.  The palliative care team will continue to follow. Please call our office at [phone] if we can be of additional assistance.   Loretta Webb Jenetta Downer, NP , DNP  This chart was dictated using voice recognition software. Despite best efforts to proofread, errors can occur which can change the documentation meaning.   COVID-19 PATIENT SCREENING TOOL Asked and negative response unless otherwise noted:   Have you had symptoms of covid, tested positive or been in contact with someone with symptoms/positive test in the past 5-10 days?  Negative

## 2020-09-30 ENCOUNTER — Ambulatory Visit
Admission: RE | Admit: 2020-09-30 | Discharge: 2020-09-30 | Disposition: A | Discharge status: Home / Self Care | Payer: Medicare Other | Source: Ambulatory Visit | Attending: Student in an Organized Health Care Education/Training Program | Admitting: Student in an Organized Health Care Education/Training Program

## 2020-09-30 ENCOUNTER — Ambulatory Visit (HOSPITAL_BASED_OUTPATIENT_CLINIC_OR_DEPARTMENT_OTHER): Payer: Medicare Other | Admitting: Student in an Organized Health Care Education/Training Program

## 2020-09-30 ENCOUNTER — Other Ambulatory Visit: Payer: Self-pay

## 2020-09-30 ENCOUNTER — Ambulatory Visit: Payer: Medicare Other | Admitting: Student in an Organized Health Care Education/Training Program

## 2020-09-30 ENCOUNTER — Encounter: Payer: Self-pay | Admitting: Student in an Organized Health Care Education/Training Program

## 2020-09-30 VITALS — BP 136/98 | HR 87 | Temp 97.1°F | Resp 18 | Ht 63.0 in | Wt 135.0 lb

## 2020-09-30 DIAGNOSIS — M19012 Primary osteoarthritis, left shoulder: Secondary | ICD-10-CM | POA: Insufficient documentation

## 2020-09-30 DIAGNOSIS — M25511 Pain in right shoulder: Secondary | ICD-10-CM | POA: Diagnosis present

## 2020-09-30 DIAGNOSIS — G8929 Other chronic pain: Secondary | ICD-10-CM | POA: Insufficient documentation

## 2020-09-30 DIAGNOSIS — M25512 Pain in left shoulder: Secondary | ICD-10-CM

## 2020-09-30 DIAGNOSIS — Z8739 Personal history of other diseases of the musculoskeletal system and connective tissue: Secondary | ICD-10-CM | POA: Insufficient documentation

## 2020-09-30 DIAGNOSIS — G894 Chronic pain syndrome: Secondary | ICD-10-CM | POA: Insufficient documentation

## 2020-09-30 DIAGNOSIS — M19011 Primary osteoarthritis, right shoulder: Secondary | ICD-10-CM | POA: Diagnosis present

## 2020-09-30 MED ORDER — DEXAMETHASONE SODIUM PHOSPHATE 10 MG/ML IJ SOLN
10.0000 mg | Freq: Once | INTRAMUSCULAR | Status: AC
  Administered 2020-09-30: 10 mg

## 2020-09-30 MED ORDER — LIDOCAINE HCL 2 % IJ SOLN
20.0000 mL | Freq: Once | INTRAMUSCULAR | Status: AC
  Administered 2020-09-30: 400 mg

## 2020-09-30 MED ORDER — IOHEXOL 180 MG/ML  SOLN
10.0000 mL | Freq: Once | INTRAMUSCULAR | Status: AC
  Administered 2020-09-30: 10 mL via INTRA_ARTICULAR

## 2020-09-30 MED ORDER — LIDOCAINE HCL 2 % IJ SOLN
INTRAMUSCULAR | Status: AC
  Filled 2020-09-30: qty 20

## 2020-09-30 MED ORDER — ROPIVACAINE HCL 2 MG/ML IJ SOLN
INTRAMUSCULAR | Status: AC
  Filled 2020-09-30: qty 10

## 2020-09-30 MED ORDER — ROPIVACAINE HCL 2 MG/ML IJ SOLN
9.0000 mL | Freq: Once | INTRAMUSCULAR | Status: AC
  Administered 2020-09-30: 9 mL via PERINEURAL

## 2020-09-30 MED ORDER — DEXAMETHASONE SODIUM PHOSPHATE 10 MG/ML IJ SOLN
INTRAMUSCULAR | Status: AC
  Filled 2020-09-30: qty 2

## 2020-09-30 NOTE — Progress Notes (Signed)
PROVIDER NOTE: Information contained herein reflects review and annotations entered in association with encounter. Interpretation of such information and data should be left to medically-trained personnel. Information provided to patient can be located elsewhere in the medical record under "Patient Instructions". Document created using STT-dictation technology, any transcriptional errors that may result from process are unintentional.    Patient: Loretta Webb  Service Category: Procedure  Provider: Edward Jolly, MD  DOB: 1931/10/27  DOS: 09/30/2020  Location: [facility]  MRN: [id number]  Setting: Ambulatory - outpatient  Referring Provider: [name], MD  Type: Established Patient  Specialty: Interventional Pain Management  PCP: [name], MD   Primary Reason for Visit: Interventional Pain Management Treatment. CC: Shoulder Pain (Both shoulder)  Procedure:          Anesthesia, Analgesia, Anxiolysis:  Type: Therapeutic Suprascapular nerve Block  Primary Purpose: Diagnostic/Therapeutric Region: Posterior Shoulder & Scapular Areas Level: Superior to the scapular spine, in the lateral aspect of the supraspinatus fossa (Suprascapular notch). Target Area: Suprascapular nerve as it passes thru the lower portion of the suprascapular notch. Approach: Posterior percutaneous approach. Laterality: Bilateral  Type: Local Anesthesia  Local Anesthetic: Lidocaine 1-2%  Position: Prone   Indications: 1. Localized osteoarthritis of shoulder regions, bilateral   2. History of rotator cuff syndrome (bilateral)   3. Chronic shoulder pain (3ry area of Pain) (Bilateral) (L>R)   4. Chronic pain syndrome    Pain Score: Pre-procedure: 5 /10 Post-procedure: 0 shoulder/10   Pre-op Assessment:  Loretta Webb is a 87 y.o. (year old), female patient, seen today for interventional treatment. She  has a past surgical history that includes knee replacements; Abdominal hysterectomy; Rotator  cuff repair; and Appendectomy. Loretta Webb has a current medication list which includes the following prescription(s): acetaminophen, aspirin, bumetanide, cholecalciferol, vitamin b-12, diclofenac sodium, donepezil, duloxetine, hydrocodone-acetaminophen, magnesium, meloxicam, metformin, mirtazapine, pantoprazole, propranolol, quetiapine, alprazolam, apixaban, aspirin, digoxin, diltiazem, losartan, pravastatin, and trazodone. Her primarily concern today is the Shoulder Pain (Both shoulder)  Initial Vital Signs:  Pulse/HCG Rate: 87ECG Heart Rate: (!) 145 (Dr. Cherylann Ratel called in to review EKG before proceeding. Patient asymptomatic. Other VSS.) Temp: (!) 97.1 F (36.2 C) Resp: 16 BP: (!) 160/91 SpO2: 98 %  BMI: Estimated body mass index is 23.91 kg/m as calculated from the following:   Height as of this encounter: 5\' 3"  (1.6 m).   Weight as of this encounter: 135 lb (61.2 kg).  Risk Assessment: Allergies: Reviewed. She has No Known Allergies.  Allergy Precautions: None required Coagulopathies: Reviewed. None identified.  Blood-thinner therapy: None at this time Active Infection(s): Reviewed. None identified. Loretta Webb is afebrile  Site Confirmation: Loretta Webb was asked to confirm the procedure and laterality before marking the site Procedure checklist: Completed Consent: Before the procedure and under the influence of no sedative(s), amnesic(s), or anxiolytics, the patient was informed of the treatment options, risks and possible complications. To fulfill our ethical and legal obligations, as recommended by the American Medical Association's Code of Ethics, I have informed the patient of my clinical impression; the nature and purpose of the treatment or procedure; the risks, benefits, and possible complications of the intervention; the alternatives, including doing nothing; the risk(s) and benefit(s) of the alternative treatment(s) or procedure(s); and the risk(s) and benefit(s) of doing  nothing. The patient was provided information about the general risks and possible complications associated with the procedure. These may include, but are not limited to: failure to achieve desired goals, infection, bleeding, organ  or nerve damage, allergic reactions, paralysis, and death. In addition, the patient was informed of those risks and complications associated to the procedure, such as failure to decrease pain; infection; bleeding; organ or nerve damage with subsequent damage to sensory, motor, and/or autonomic systems, resulting in permanent pain, numbness, and/or weakness of one or several areas of the body; allergic reactions; (i.e.: anaphylactic reaction); and/or death. Furthermore, the patient was informed of those risks and complications associated with the medications. These include, but are not limited to: allergic reactions (i.e.: anaphylactic or anaphylactoid reaction(s)); adrenal axis suppression; blood sugar elevation that in diabetics may result in ketoacidosis or comma; water retention that in patients with history of congestive heart failure may result in shortness of breath, pulmonary edema, and decompensation with resultant heart failure; weight gain; swelling or edema; medication-induced neural toxicity; particulate matter embolism and blood vessel occlusion with resultant organ, and/or nervous system infarction; and/or aseptic necrosis of one or more joints. Finally, the patient was informed that Medicine is not an exact science; therefore, there is also the possibility of unforeseen or unpredictable risks and/or possible complications that may result in a catastrophic outcome. The patient indicated having understood very clearly. We have given the patient no guarantees and we have made no promises. Enough time was given to the patient to ask questions, all of which were answered to the patient's satisfaction. Loretta Webb has indicated that she wanted to continue with the  procedure. Attestation: I, the ordering provider, attest that I have discussed with the patient the benefits, risks, side-effects, alternatives, likelihood of achieving goals, and potential problems during recovery for the procedure that I have provided informed consent. Date  Time: 09/30/2020 10:32 AM  Pre-Procedure Preparation:  Monitoring: As per clinic protocol. Respiration, ETCO2, SpO2, BP, heart rate and rhythm monitor placed and checked for adequate function Safety Precautions: Patient was assessed for positional comfort and pressure points before starting the procedure. Time-out: I initiated and conducted the "Time-out" before starting the procedure, as per protocol. The patient was asked to participate by confirming the accuracy of the "Time Out" information. Verification of the correct person, site, and procedure were performed and confirmed by me, the nursing staff, and the patient. "Time-out" conducted as per Joint Commission's Universal Protocol (UP.01.01.01). Time: 1111  Description of Procedure:          Area Prepped: Entire shoulder Area DuraPrep (Iodine Povacrylex [0.7% available iodine] and Isopropyl Alcohol, 74% w/w) Safety Precautions: Aspiration looking for blood return was conducted prior to all injections. At no point did we inject any substances, as a needle was being advanced. No attempts were made at seeking any paresthesias. Safe injection practices and needle disposal techniques used. Medications properly checked for expiration dates. SDV (single dose vial) medications used. Description of the Procedure: Protocol guidelines were followed. The patient was placed in position over the procedure table. The target area was identified and the area prepped in the usual manner. Skin & deeper tissues infiltrated with local anesthetic. Appropriate amount of time allowed to pass for local anesthetics to take effect. The procedure needles were then advanced to the target area. Proper  needle placement secured. Negative aspiration confirmed. Solution injected in intermittent fashion, asking for systemic symptoms every 0.5cc of injectate. The needles were then removed and the area cleansed, making sure to leave some of the prepping solution back to take advantage of its long term bactericidal properties.  Vitals:   09/30/20 1034 09/30/20 1107 09/30/20 1113 09/30/20 1118  BP: Marland Kitchen)  160/91 (!) 156/99 (!) 149/75 (!) 136/98  Pulse: 87     Resp:  16 16 18   Temp: (!) 97.1 F (36.2 C)     SpO2: 98% 98% 95% 95%  Weight: 135 lb (61.2 kg)     Height: 5\' 3"  (1.6 m)       Start Time: 1111 hrs. End Time: 1117 hrs. Materials:  Needle(s) Type: Spinal Needle Gauge: 25G Length: 3.5-in Medication(s): Please see orders for medications and dosing details. 5 cc solution made of 4 cc of 0.2% ropivacaine, 1 cc of Decadron 10 mg/cc.  Injected for the right suprascapular nerve 5 cc solution made of 4 cc of 0.2% ropivacaine, 1 cc of Decadron 10 mg/cc.  Injected for the left suprascapular nerve Imaging Guidance (Non-Spinal):          Type of Imaging Technique: Fluoroscopy Guidance (Non-Spinal) and ultrasound guidance to visualize suprascapular notch and suprascapular artery Indication(s): Assistance in needle guidance and placement for procedures requiring needle placement in or near specific anatomical locations not easily accessible without such assistance. Exposure Time: Please see nurses notes. Contrast: Before injecting any contrast, we confirmed that the patient did not have an allergy to iodine, shellfish, or radiological contrast. Once satisfactory needle placement was completed at the desired level, radiological contrast was injected. Contrast injected under live fluoroscopy. No contrast complications. See chart for type and volume of contrast used. Fluoroscopic Guidance: I was personally present during the use of fluoroscopy. "Tunnel Vision Technique" used to obtain the best possible view  of the target area. Parallax error corrected before commencing the procedure. "Direction-depth-direction" technique used to introduce the needle under continuous pulsed fluoroscopy. Once target was reached, antero-posterior, oblique, and lateral fluoroscopic projection used confirm needle placement in all planes. Images permanently stored in EMR. Interpretation: I personally interpreted the imaging intraoperatively. Adequate needle placement confirmed in multiple planes. Appropriate spread of contrast into desired area was observed. No evidence of afferent or efferent intravascular uptake. Permanent images saved into the patient's record.   Post-operative Assessment:  Post-procedure Vital Signs:  Pulse/HCG Rate: 87(!) 125 Temp: (!) 97.1 F (36.2 C) Resp: 18 BP: (!) 136/98 SpO2: 95 %  EBL: None  Complications: No immediate post-treatment complications observed by team, or reported by patient.  Note: The patient tolerated the entire procedure well. A repeat set of vitals were taken after the procedure and the patient was kept under observation following institutional policy, for this type of procedure. Post-procedural neurological assessment was performed, showing return to baseline, prior to discharge. The patient was provided with post-procedure discharge instructions, including a section on how to identify potential problems. Should any problems arise concerning this procedure, the patient was given instructions to immediately contact us, at any time, without hesitation. In any case, we plan to contact the patient by telephone for a follow-up status report regarding this interventional procedure.  Comments:  No additional relevant information.  Plan of Care  Orders:  Orders Placed This Encounter  Procedures  . DG PAIN [facility] C-ARM 1-60 MIN NO REPORT    Intraoperative interpretation by procedural physician at [facility].    Standing Status:   Standing    Number of Occurrences:    1    Order Specific Question:   Reason for exam:    Answer:   Assistance in needle guidance and placement for procedures requiring needle placement in or near specific anatomical locations not easily accessible without such assistance.    Medications ordered for procedure: Meds ordered  this encounter  Medications  . iohexol (OMNIPAQUE) 180 MG/ML injection 10 mL    Must be Myelogram-compatible. If not available, you may substitute with a water-soluble, non-ionic, hypoallergenic, myelogram-compatible radiological contrast medium.  Marland Kitchen lidocaine (XYLOCAINE) 2 % (with pres) injection 400 mg  . ropivacaine (PF) 2 mg/mL (0.2%) (NAROPIN) injection 9 mL  . dexamethasone (DECADRON) injection 10 mg  . dexamethasone (DECADRON) injection 10 mg   Medications administered: We administered iohexol, lidocaine, ropivacaine (PF) 2 mg/mL (0.2%), dexamethasone, and dexamethasone.  See the medical record for exact dosing, route, and time of administration.  Follow-up plan:   Return in about 8 weeks (around 11/25/2020) for Post Procedure Evaluation, virtual.     Recent Visits Date Type Provider Dept  08/26/20 Procedure visit Edward Jolly, MD [facility]  07/20/20 Telemedicine Edward Jolly, MD [facility]  Showing recent visits within past 90 days and meeting all other requirements Today's Visits Date Type Provider Dept  09/30/20 Procedure visit Edward Jolly, MD [facility]  Showing today's visits and meeting all other requirements Future Appointments Date Type Provider Dept  11/25/20 Appointment Edward Jolly, MD [facility]  Showing future appointments within next 90 days and meeting all other requirements  Disposition: Discharge home  Discharge (Date  Time): 09/30/2020; 1135 hrs.   Primary Care Physician: [name], MD Location: [facility] Note by: Edward Jolly, MD Date: 09/30/2020; Time: 11:39 AM  Disclaimer:   Medicine is not an exact science. The only guarantee in medicine is that nothing is guaranteed. It is important to note that the decision to proceed with this intervention was based on the information collected from the patient. The Data and conclusions were drawn from the patient's questionnaire, the interview, and the physical examination. Because the information was provided in large part by the patient, it cannot be guaranteed that it has not been purposely or unconsciously manipulated. Every effort has been made to obtain as much relevant data as possible for this evaluation. It is important to note that the conclusions that lead to this procedure are derived in large part from the available data. Always take into account that the treatment will also be dependent on availability of resources and existing treatment guidelines, considered by other Pain Management Practitioners as being common knowledge and practice, at the time of the intervention. For Medico-Legal purposes, it is also important to point out that variation in procedural techniques and pharmacological choices are the acceptable norm. The indications, contraindications, technique, and results of the above procedure should only be interpreted and judged by a Board-Certified Interventional Pain Specialist with extensive familiarity and expertise in the same exact procedure and technique.

## 2020-09-30 NOTE — Patient Instructions (Signed)
Trigger Point Injection Trigger points are areas where you have pain. A trigger point injection is a shot given in the trigger point to help relieve pain for a few days to a few months. Common places for trigger points include:  The neck.  The shoulders.  The upper back.  The lower back. A trigger point injection will not cure long-term (chronic) pain permanently. These injections do not always work for every person. For some people, they can help to relieve pain for a few days to a few months. Tell a health care provider about:  Any allergies you have.  All medicines you are taking, including vitamins, herbs, eye drops, creams, and over-the-counter medicines.  Any problems you or family members have had with anesthetic medicines.  Any blood disorders you have.  Any surgeries you have had.  Any medical conditions you have. What are the risks? Generally, this is a safe procedure. However, problems may occur, including:  Infection.  Bleeding or bruising.  Allergic reaction to the injected medicine.  Irritation of the skin around the injection site. What happens before the procedure? Ask your health care provider about:  Changing or stopping your regular medicines. This is especially important if you are taking diabetes medicines or blood thinners.  Taking medicines such as aspirin and ibuprofen. These medicines can thin your blood. Do not take these medicines unless your health care provider tells you to take them.  Taking over-the-counter medicines, vitamins, herbs, and supplements. What happens during the procedure?  Your health care provider will feel for trigger points. A marker may be used to circle the area for the injection.  The skin over the trigger point will be washed with a germ-killing (antiseptic) solution.  A thin needle is used for the injection. You may feel pain or a twitching feeling when the needle enters the trigger point.  A numbing solution may  be injected into the trigger point. Sometimes a medicine to keep down inflammation is also injected.  Your health care provider may move the needle around the area where the trigger point is located until the tightness and twitching goes away.  After the injection, your health care provider may put gentle pressure over the injection site.  The injection site will be covered with a bandage (dressing). The procedure may vary among health care providers and hospitals.   What can I expect after treatment? After treatment, you may have:  Soreness and stiffness for 1-2 days.  A dressing. This can be taken off in a few hours or as told by your health care provider. Follow these instructions at home: Injection site care  Remove your dressing as told by your health care provider.  Check your injection site every day for signs of infection. Check for: ? Redness, swelling, or pain. ? Fluid or blood. ? Warmth. ? Pus or a bad smell. Managing pain, stiffness, and swelling  If directed, put ice on the affected area. ? Put ice in a plastic bag. ? Place a towel between your skin and the bag. ? Leave the ice on for 20 minutes, 2-3 times a day. General instructions  If you were asked to stop your regular medicines, ask your health care provider when you may start taking them again.  Return to your normal activities as told by your health care provider. Ask your health care provider what activities are safe for you.  Do not take baths, swim, or use a hot tub until your health care provider approves.    You may be asked to see an occupational or physical therapist for exercises that reduce muscle strain and stretch the area of the trigger point.  Keep all follow-up visits as told by your health care provider. This is important. Contact a health care provider if:  Your pain comes back, and it is worse than before the injection. You may need more injections.  You have chills or a fever.  The  injection site becomes more painful, red, swollen, or warm to the touch. Summary  A trigger point injection is a shot given in the trigger point to help relieve pain for a few days to a few months.  Common places for trigger point injections are the neck, shoulder, upper back, and lower back.  These injections do not always work for every person, but for some people, the injections can help to relieve pain for a few days to a few months.  Contact a health care provider if symptoms come back or they are worse than before treatment. Also, get help if the injection site becomes more painful, red, swollen, or warm to the touch. This information is not intended to replace advice given to you by your health care provider. Make sure you discuss any questions you have with your health care provider. Document Revised: 06/06/2018 Document Reviewed: 06/06/2018 Elsevier Patient Education  2021 [employer] Pain Management Discharge Instructions  General Discharge Instructions :  If you need to reach your doctor call: Monday-Friday 8:00 am - 4:00 pm at [phone] or toll free [phone].  After clinic hours [phone] to have operator reach doctor.  Bring all of your medication bottles to all your appointments in the pain clinic.  To cancel or reschedule your appointment with Pain Management please remember to call 24 hours in advance to avoid a fee.  Refer to the educational materials which you have been given on: General Risks, I had my Procedure. Discharge Instructions, Post Sedation.  Post Procedure Instructions:  The drugs you were given will stay in your system until tomorrow, so for the next 24 hours you should not drive, make any legal decisions or drink any alcoholic beverages.  You may eat anything you prefer, but it is better to start with liquids then soups and crackers, and gradually work up to solid foods.  Please notify your doctor immediately if you have any unusual  bleeding, trouble breathing or pain that is not related to your normal pain.  Depending on the type of procedure that was done, some parts of your body may feel week and/or numb.  This usually clears up by tonight or the next day.  Walk with the use of an assistive device or accompanied by an adult for the 24 hours.  You may use ice on the affected area for the first 24 hours.  Put ice in a Ziploc bag and cover with a towel and place against area 15 minutes on 15 minutes off.  You may switch to heat after 24 hours. 

## 2020-09-30 NOTE — Progress Notes (Signed)
Safety precautions to be maintained throughout the outpatient stay will include: orient to surroundings, keep bed in low position, maintain call bell within reach at all times, provide assistance with transfer out of bed and ambulation.  

## 2020-10-01 ENCOUNTER — Telehealth: Payer: Self-pay | Admitting: *Deleted

## 2020-10-01 NOTE — Telephone Encounter (Signed)
Called patient re; procedure on yesterday.  Spoke with her husband, he asked if she could call us back.  I told him only if she has any questions, otherwise we would see her at her next appt.

## 2020-10-18 ENCOUNTER — Encounter: Payer: Self-pay | Admitting: Student in an Organized Health Care Education/Training Program

## 2020-11-02 ENCOUNTER — Encounter: Payer: Self-pay | Admitting: Student in an Organized Health Care Education/Training Program

## 2020-11-04 ENCOUNTER — Other Ambulatory Visit: Payer: Self-pay

## 2020-11-04 ENCOUNTER — Encounter: Payer: Self-pay | Admitting: Student in an Organized Health Care Education/Training Program

## 2020-11-04 ENCOUNTER — Ambulatory Visit (HOSPITAL_BASED_OUTPATIENT_CLINIC_OR_DEPARTMENT_OTHER): Payer: Medicare Other | Admitting: Student in an Organized Health Care Education/Training Program

## 2020-11-04 ENCOUNTER — Ambulatory Visit
Admission: RE | Admit: 2020-11-04 | Discharge: 2020-11-04 | Disposition: A | Discharge status: Home / Self Care | Payer: Medicare Other | Source: Ambulatory Visit | Attending: Student in an Organized Health Care Education/Training Program | Admitting: Student in an Organized Health Care Education/Training Program

## 2020-11-04 VITALS — BP 145/94 | HR 85 | Temp 96.9°F | Resp 18 | Ht 63.0 in | Wt 140.0 lb

## 2020-11-04 DIAGNOSIS — M25512 Pain in left shoulder: Secondary | ICD-10-CM

## 2020-11-04 DIAGNOSIS — Z8739 Personal history of other diseases of the musculoskeletal system and connective tissue: Secondary | ICD-10-CM | POA: Diagnosis present

## 2020-11-04 DIAGNOSIS — G8929 Other chronic pain: Secondary | ICD-10-CM | POA: Insufficient documentation

## 2020-11-04 DIAGNOSIS — G894 Chronic pain syndrome: Secondary | ICD-10-CM | POA: Diagnosis present

## 2020-11-04 DIAGNOSIS — M19011 Primary osteoarthritis, right shoulder: Secondary | ICD-10-CM | POA: Insufficient documentation

## 2020-11-04 DIAGNOSIS — M19012 Primary osteoarthritis, left shoulder: Secondary | ICD-10-CM | POA: Diagnosis present

## 2020-11-04 DIAGNOSIS — M25511 Pain in right shoulder: Secondary | ICD-10-CM | POA: Diagnosis present

## 2020-11-04 MED ORDER — DEXAMETHASONE SODIUM PHOSPHATE 10 MG/ML IJ SOLN
10.0000 mg | Freq: Once | INTRAMUSCULAR | Status: AC
  Administered 2020-11-04: 10 mg

## 2020-11-04 MED ORDER — LIDOCAINE HCL 2 % IJ SOLN
20.0000 mL | Freq: Once | INTRAMUSCULAR | Status: AC
  Administered 2020-11-04: 200 mg

## 2020-11-04 MED ORDER — DEXAMETHASONE SODIUM PHOSPHATE 10 MG/ML IJ SOLN
INTRAMUSCULAR | Status: AC
  Filled 2020-11-04: qty 1

## 2020-11-04 MED ORDER — IOHEXOL 180 MG/ML  SOLN
10.0000 mL | Freq: Once | INTRAMUSCULAR | Status: AC
  Administered 2020-11-04: 5 mL via INTRA_ARTICULAR

## 2020-11-04 MED ORDER — LIDOCAINE HCL 2 % IJ SOLN
INTRAMUSCULAR | Status: AC
  Filled 2020-11-04: qty 10

## 2020-11-04 MED ORDER — ROPIVACAINE HCL 2 MG/ML IJ SOLN
4.0000 mL | Freq: Once | INTRAMUSCULAR | Status: AC
  Administered 2020-11-04: 4 mL via INTRA_ARTICULAR

## 2020-11-04 MED ORDER — ROPIVACAINE HCL 2 MG/ML IJ SOLN
INTRAMUSCULAR | Status: AC
  Filled 2020-11-04: qty 20

## 2020-11-04 MED ORDER — HYDROCODONE-ACETAMINOPHEN 5-325 MG PO TABS: 1.0000 | ORAL_TABLET | Freq: Every day | ORAL | 0 refills | Status: AC | PRN

## 2020-11-04 NOTE — Progress Notes (Signed)
PROVIDER NOTE: Information contained herein reflects review and annotations entered in association with encounter. Interpretation of such information and data should be left to medically-trained personnel. Information provided to patient can be located elsewhere in the medical record under "Patient Instructions". Document created using STT-dictation technology, any transcriptional errors that may result from process are unintentional.    Patient: Loretta Webb  Service Category: Procedure  Provider: Edward Jolly, MD  DOB: Sep 16, 1931  DOS: 11/04/2020  Location: [facility]  MRN: [id number]  Setting: Ambulatory - outpatient  Referring Provider: [name], MD  Type: Established Patient  Specialty: Interventional Pain Management  PCP: [name], MD   Primary Reason for Visit: Interventional Pain Management Treatment. CC: right shoulder pain  Procedure:          Anesthesia, Analgesia, Anxiolysis:  Type: Diagnostic Suprascapular nerve Block          Primary Purpose: Diagnostic Region: Posterior Shoulder & Scapular Areas Level: Superior to the scapular spine, in the lateral aspect of the supraspinatus fossa (Suprescapular notch). Target Area: Suprascapular nerve as it passes thru the lower portion of the suprascapular notch. Approach: Posterior percutaneous approach. Laterality: Right-Side   Local Anesthetic: Lidocaine 1-2%  Position: Prone   Indications: 1. Localized osteoarthritis of shoulder regions, bilateral   2. History of rotator cuff syndrome (bilateral)   3. Chronic shoulder pain (3ry area of Pain) (Bilateral) (L>R)   4. Chronic pain syndrome    Pain Score: Pre-procedure: 7 /10 Post-procedure: 4 /10   Pre-op H&P Assessment:  Loretta Webb is a 87 y.o. (year old), female patient, seen today for interventional treatment. She  has a past surgical history that includes knee replacements; Abdominal hysterectomy; Rotator cuff repair; and Appendectomy. Loretta Webb  has a current medication list which includes the following prescription(s): acetaminophen, aspirin, bumetanide, cholecalciferol, vitamin b-12, diclofenac sodium, donepezil, duloxetine, hydrocodone-acetaminophen, magnesium, meloxicam, metformin, mirtazapine, pantoprazole, propranolol, quetiapine, alprazolam, apixaban, aspirin, digoxin, diltiazem, losartan, pravastatin, and trazodone. Her primarily concern today is the No chief complaint on file.  Initial Vital Signs:  Pulse/HCG Rate: 85ECG Heart Rate: (!) 139 (Dr. Cherylann Ratel aware.) Temp: (!) 96.9 F (36.1 C) Resp: 18 BP: 135/85 SpO2: 100 %  BMI: Estimated body mass index is 24.8 kg/m as calculated from the following:   Height as of this encounter: 5\' 3"  (1.6 m).   Weight as of this encounter: 140 lb (63.5 kg).  Risk Assessment: Allergies: Reviewed. She has No Known Allergies.  Allergy Precautions: None required Coagulopathies: Reviewed. None identified.  Blood-thinner therapy: None at this time Active Infection(s): Reviewed. None identified. Loretta Webb is afebrile  Site Confirmation: Loretta Webb was asked to confirm the procedure and laterality before marking the site Procedure checklist: Completed Consent: Before the procedure and under the influence of no sedative(s), amnesic(s), or anxiolytics, the patient was informed of the treatment options, risks and possible complications. To fulfill our ethical and legal obligations, as recommended by the American Medical Association's Code of Ethics, I have informed the patient of my clinical impression; the nature and purpose of the treatment or procedure; the risks, benefits, and possible complications of the intervention; the alternatives, including doing nothing; the risk(s) and benefit(s) of the alternative treatment(s) or procedure(s); and the risk(s) and benefit(s) of doing nothing. The patient was provided information about the general risks and possible complications associated with the  procedure. These may include, but are not limited to: failure to achieve desired goals, infection, bleeding, organ or nerve damage, allergic reactions,  paralysis, and death. In addition, the patient was informed of those risks and complications associated to the procedure, such as failure to decrease pain; infection; bleeding; organ or nerve damage with subsequent damage to sensory, motor, and/or autonomic systems, resulting in permanent pain, numbness, and/or weakness of one or several areas of the body; allergic reactions; (i.e.: anaphylactic reaction); and/or death. Furthermore, the patient was informed of those risks and complications associated with the medications. These include, but are not limited to: allergic reactions (i.e.: anaphylactic or anaphylactoid reaction(s)); adrenal axis suppression; blood sugar elevation that in diabetics may result in ketoacidosis or comma; water retention that in patients with history of congestive heart failure may result in shortness of breath, pulmonary edema, and decompensation with resultant heart failure; weight gain; swelling or edema; medication-induced neural toxicity; particulate matter embolism and blood vessel occlusion with resultant organ, and/or nervous system infarction; and/or aseptic necrosis of one or more joints. Finally, the patient was informed that Medicine is not an exact science; therefore, there is also the possibility of unforeseen or unpredictable risks and/or possible complications that may result in a catastrophic outcome. The patient indicated having understood very clearly. We have given the patient no guarantees and we have made no promises. Enough time was given to the patient to ask questions, all of which were answered to the patient's satisfaction. Loretta Webb has indicated that she wanted to continue with the procedure. Attestation: I, the ordering provider, attest that I have discussed with the patient the benefits, risks,  side-effects, alternatives, likelihood of achieving goals, and potential problems during recovery for the procedure that I have provided informed consent. Date  Time: 11/04/2020  9:16 AM  Pre-Procedure Preparation:  Monitoring: As per clinic protocol. Respiration, ETCO2, SpO2, BP, heart rate and rhythm monitor placed and checked for adequate function Safety Precautions: Patient was assessed for positional comfort and pressure points before starting the procedure. Time-out: I initiated and conducted the "Time-out" before starting the procedure, as per protocol. The patient was asked to participate by confirming the accuracy of the "Time Out" information. Verification of the correct person, site, and procedure were performed and confirmed by me, the nursing staff, and the patient. "Time-out" conducted as per Joint Commission's Universal Protocol (UP.01.01.01). Time: 1004  Description of Procedure:          Area Prepped: Entire shoulder Area DuraPrep (Iodine Povacrylex [0.7% available iodine] and Isopropyl Alcohol, 74% w/w) Safety Precautions: Aspiration looking for blood return was conducted prior to all injections. At no point did we inject any substances, as a needle was being advanced. No attempts were made at seeking any paresthesias. Safe injection practices and needle disposal techniques used. Medications properly checked for expiration dates. SDV (single dose vial) medications used. Description of the Procedure: Protocol guidelines were followed. The patient was placed in position over the procedure table. The target area was identified and the area prepped in the usual manner. Skin & deeper tissues infiltrated with local anesthetic. Appropriate amount of time allowed to pass for local anesthetics to take effect. The procedure needles were then advanced to the target area. Proper needle placement secured. Negative aspiration confirmed. Solution injected in intermittent fashion, asking for systemic  symptoms every 0.5cc of injectate. The needles were then removed and the area cleansed, making sure to leave some of the prepping solution back to take advantage of its long term bactericidal properties.  Vitals:   11/04/20 1000 11/04/20 1005 11/04/20 1008 11/04/20 1020  BP: (!) 137/94 (!) 145/94  Pulse:      Resp: (!) 25 20 18 18   Temp:      TempSrc:      SpO2: 100% 99% 100%   Weight:      Height:        Start Time: 1004 hrs. End Time: 1007 hrs. Materials:  Needle(s) Type: Spinal Needle Gauge: 22G Length: 3.5-in Medication(s): Please see orders for medications and dosing details. 5cc solution made of 4 cc of 0.2% ropivacaine, 1 cc of Decadron 10 mg/cc.  Imaging Guidance (Non-Spinal):          Type of Imaging Technique: Fluoroscopy Guidance (Non-Spinal) Indication(s): Assistance in needle guidance and placement for procedures requiring needle placement in or near specific anatomical locations not easily accessible without such assistance. Exposure Time: Please see nurses notes. Contrast: Before injecting any contrast, we confirmed that the patient did not have an allergy to iodine, shellfish, or radiological contrast. Once satisfactory needle placement was completed at the desired level, radiological contrast was injected. Contrast injected under live fluoroscopy. No contrast complications. See chart for type and volume of contrast used. Fluoroscopic Guidance: I was personally present during the use of fluoroscopy. "Tunnel Vision Technique" used to obtain the best possible view of the target area. Parallax error corrected before commencing the procedure. "Direction-depth-direction" technique used to introduce the needle under continuous pulsed fluoroscopy. Once target was reached, antero-posterior, oblique, and lateral fluoroscopic projection used confirm needle placement in all planes. Images permanently stored in EMR. Interpretation: I personally interpreted the imaging  intraoperatively. Adequate needle placement confirmed in multiple planes. Appropriate spread of contrast into desired area was observed. No evidence of afferent or efferent intravascular uptake. Permanent images saved into the patient's record.   Post-operative Assessment:  Post-procedure Vital Signs:  Pulse/HCG Rate: 8580 (irregular pulse, taken sitting in wheelchair, PO fluid and icepack given.) Temp: (!) 96.9 F (36.1 C) Resp: 18 BP: (!) 145/94 SpO2: 100 %  EBL: None  Complications: No immediate post-treatment complications observed by team, or reported by patient.  Note: The patient tolerated the entire procedure well. A repeat set of vitals were taken after the procedure and the patient was kept under observation following institutional policy, for this type of procedure. Post-procedural neurological assessment was performed, showing return to baseline, prior to discharge. The patient was provided with post-procedure discharge instructions, including a section on how to identify potential problems. Should any problems arise concerning this procedure, the patient was given instructions to immediately contact , at any time, without hesitation. In any case, we plan to contact the patient by telephone for a follow-up status report regarding this interventional procedure.  Comments:  No additional relevant information.  Plan of Care  Orders:  Orders Placed This Encounter  Procedures   DG PAIN CLINIC C-ARM 1-60 MIN NO REPORT    Intraoperative interpretation by procedural physician at [facility].    Standing Status:   Standing    Number of Occurrences:   1    Order Specific Question:   Reason for exam:    Answer:   Assistance in needle guidance and placement for procedures requiring needle placement in or near specific anatomical locations not easily accessible without such assistance.   Compliance Drug Analysis, Ur    Volume: 30 ml(s). Minimum 3 ml of urine is  needed. Document temperature of fresh sample. Indications: Long term (current) use of opiate analgesic [facility]) Test#: (V78.469 (Comprehensive Profile)    Order Specific Question:   Release to patient  Answer:   Immediate     Medications ordered for procedure: Meds ordered this encounter  Medications   iohexol (OMNIPAQUE) 180 MG/ML injection 10 mL    Must be Myelogram-compatible. If not available, you may substitute with a water-soluble, non-ionic, hypoallergenic, myelogram-compatible radiological contrast medium.   lidocaine (XYLOCAINE) 2 % (with pres) injection 400 mg   ropivacaine (PF) 2 mg/mL (0.2%) (NAROPIN) injection 4 mL   dexamethasone (DECADRON) injection 10 mg   HYDROcodone-acetaminophen (NORCO/VICODIN) 5-325 MG tablet    Sig: Take 1 tablet by mouth daily as needed for severe pain. Must last 30 days.    Dispense:  30 tablet    Refill:  0    Chronic Pain: STOP Act (Not applicable) Fill 1 day early if closed on refill date. Avoid benzodiazepines within 8 hours of opioids   Medications administered: We administered iohexol, lidocaine, ropivacaine (PF) 2 mg/mL (0.2%), and dexamethasone.  See the medical record for exact dosing, route, and time of administration.  Follow-up plan:   Return in about 4 weeks (around 12/02/2020), or PPE VV.     Recent Visits Date Type Provider Dept  09/30/20 Procedure visit Edward JollyLateef, Julene Rahn, MD [facility]  08/26/20 Procedure visit Edward JollyLateef, Kameka Whan, MD [facility]  Showing recent visits within past 90 days and meeting all other requirements Today's Visits Date Type Provider Dept  11/04/20 Procedure visit Edward JollyLateef, Meron Bocchino, MD [facility]  Showing today's visits and meeting all other requirements Future Appointments Date Type Provider Dept  11/25/20 Appointment Edward JollyLateef, Macgregor Aeschliman, MD [facility]  Showing future appointments within next 90 days and meeting all other requirements Disposition: Discharge home   Discharge (Date  Time): 11/04/2020; 1020 hrs.   Primary Care Physician: Danella PentonMiller, Mark F, MD [facility] Note by: Edward JollyBilal Azalia Neuberger, MD Date: 11/04/2020; Time: 10:43 AM  Disclaimer:  Medicine is not an exact science. The only guarantee in medicine is that nothing is guaranteed. It is important to note that the decision to proceed with this intervention was based on the information collected from the patient. The Data and conclusions were drawn from the patient's questionnaire, the interview, and the physical examination. Because the information was provided in large part by the patient, it cannot be guaranteed that it has not been purposely or unconsciously manipulated. Every effort has been made to obtain as much relevant data as possible for this evaluation. It is important to note that the conclusions that lead to this procedure are derived in large part from the available data. Always take into account that the treatment will also be dependent on availability of resources and existing treatment guidelines, considered by other Pain Management Practitioners as being common knowledge and practice, at the time of the intervention. For Medico-Legal purposes, it is also important to point out that variation in procedural techniques and pharmacological choices are the acceptable norm. The indications, contraindications, technique, and results of the above procedure should only be interpreted and judged by a Board-Certified Interventional Pain Specialist with extensive familiarity and expertise in the same exact procedure and technique.

## 2020-11-04 NOTE — Progress Notes (Signed)
Safety precautions to be maintained throughout the outpatient stay will include: orient to surroundings, keep bed in low position, maintain call bell within reach at all times, provide assistance with transfer out of bed and ambulation.  

## 2020-11-05 ENCOUNTER — Telehealth: Payer: Self-pay

## 2020-11-05 NOTE — Telephone Encounter (Signed)
Post procedure phone call.  Spoke with Daughter Eunice Blase and she states that the patient is doing much better today.

## 2020-11-12 LAB — COMPLIANCE DRUG ANALYSIS, UR

## 2020-11-24 ENCOUNTER — Other Ambulatory Visit: Payer: Medicare Other | Admitting: Adult Health Nurse Practitioner

## 2020-11-24 ENCOUNTER — Other Ambulatory Visit: Payer: Medicare Other | Admitting: Student

## 2020-11-24 ENCOUNTER — Encounter: Payer: Self-pay | Admitting: Student in an Organized Health Care Education/Training Program

## 2020-11-24 ENCOUNTER — Other Ambulatory Visit: Payer: Self-pay

## 2020-11-24 DIAGNOSIS — R531 Weakness: Secondary | ICD-10-CM

## 2020-11-24 DIAGNOSIS — G4700 Insomnia, unspecified: Secondary | ICD-10-CM

## 2020-11-24 DIAGNOSIS — R52 Pain, unspecified: Secondary | ICD-10-CM

## 2020-11-24 DIAGNOSIS — Z515 Encounter for palliative care: Secondary | ICD-10-CM

## 2020-11-24 DIAGNOSIS — K5900 Constipation, unspecified: Secondary | ICD-10-CM

## 2020-11-24 NOTE — Progress Notes (Signed)
[facility] Consult Note Telephone: [phone]  Fax: [phone]    Date of encounter: 11/24/20 PATIENT NAME: Loretta Webb [address] [place] [id number]   [phone] (home)  DOB: 01-Apr-1932 MRN: [id number] PRIMARY CARE PROVIDER:    Rusty Aus, MD,  [place] [address] [phone]  REFERRING PROVIDER:   Rusty Aus, MD [place],  [place] [address] [phone]  RESPONSIBLE PARTY:    Contact Information     Name Relation Home Work Mobile   Lockery,Debbie Daughter [phone]     Alferd Apa [phone]          I met face to face with patient  in the home.  Palliative Care was asked to follow this patient by consultation request of  Rusty Aus, MD to address advance care planning and complex medical decision making. This is a follow up visit.                                   ASSESSMENT AND PLAN / RECOMMENDATIONS:   Advance Care Planning/Goals of Care: Goals include to maximize quality of life and symptom management. Our advance care planning conversation included a discussion about:    The value and importance of advance care planning  CODE STATUS: Full Code  Symptom Management/Plan:  Pain-patient OA, bilateral shoulder pain, received complete nerve block to right shoulder recently. Recommend increasing voltaren gel to QID, currently only using BID. Continue acetaminophen routinely.  F/u with ortho, pain management as scheduled. Recommend PT as able to tolerate.   Weakness to upper and lower extremities-recommend PT; referral pending per patient.   Constipation-continue Miralax as directed.   Insomnia-patient reports to continues not sleeping well; she is currently receiving Seroquel and Remeron QHS. She would like to defer to physician that prescribes medications for  management. Follow up scheduled on 12/07/20. Discussed sleep hygiene tips, night time routine.   Follow up Palliative Care Visit: Palliative care will continue to follow for complex medical decision making, advance care planning, and clarification of goals. Return in 8 weeks or prn.   I spent 40 minutes providing this consultation. More than 50% of the time in this consultation was spent in counseling and care coordination.    PPS: 60%  HOSPICE ELIGIBILITY/DIAGNOSIS: TBD  Chief Complaint: Palliative Medicine follow up visit.  HISTORY OF PRESENT ILLNESS:  JENSEN CHERAMIE is a 87 y.o. year old female  with dementia, OA, hypertension, T2DM, anxiety, depression, rotator cuff syndrome, lumbar stenosis, osteoporosis.   Patient reports doing well overall. She states her left shoulder is better than right. She endorses right shoulder pain, states that the nerve block did not help. Plan is to get therapy and f/u with ortho as needed. She endorses a good appetite. She states she is still not sleeping well. Currently taking Seroquel and Remeron QHS. She is not taking alprazolam; states she saves that when she is really anxious. Endorses occasional constipation; takes Miralax. She is able to complete most adl's. Her daughter Jackelyn Poling manages her medications, fills pill box. No recent falls/ injury.   History obtained from review of EMR, discussion with primary team, and interview with family, facility staff/caregiver and/or Ms. Dorris.  I reviewed available labs, medications, imaging, studies and related documents from the EMR.  Records reviewed and summarized above.  ROS  A 10 Point review of systems is negative, except for the pertinent positives and negatives detailed in the HPI.   Physical Exam: Pulse 68, resp 18, b/p 130/80, sats 96% on room air.  Constitutional: NAD General: frail appearing, thin EYES: anicteric sclera, lids intact, no discharge  ENMT: intact hearing, oral mucous membranes  moist CV: S1S2, RRR, no LE edema Pulmonary: LCTA, no increased work of breathing, no cough, room air Abdomen: i normo-active BS + 4 quadrants, soft and non tender GU: deferred MSK: decreased ROM to bilateral arms, ambulatory without assistive device Skin: warm and dry, no rashes or wounds on visible skin Neuro:  generalized weakness, A & O x 3, mild forgetfulness Psych: non-anxious affect Hem/lymph/immuno: no widespread bruising   Thank you for the opportunity to participate in the care of Ms. Waldren.  The palliative care team will continue to follow. Please call our office at [phone] if we can be of additional assistance.   Ezekiel Slocumb, NP   COVID-19 PATIENT SCREENING TOOL Asked and negative response unless otherwise noted:   Have you had symptoms of covid, tested positive or been in contact with someone with symptoms/positive test in the past 5-10 days? No

## 2020-11-25 ENCOUNTER — Encounter: Payer: Self-pay | Admitting: Student in an Organized Health Care Education/Training Program

## 2020-11-25 ENCOUNTER — Other Ambulatory Visit: Payer: Self-pay

## 2020-11-25 ENCOUNTER — Ambulatory Visit
Payer: Medicare Other | Attending: Student in an Organized Health Care Education/Training Program | Admitting: Student in an Organized Health Care Education/Training Program

## 2020-11-25 DIAGNOSIS — M19011 Primary osteoarthritis, right shoulder: Secondary | ICD-10-CM

## 2020-11-25 DIAGNOSIS — M19012 Primary osteoarthritis, left shoulder: Secondary | ICD-10-CM

## 2020-11-25 DIAGNOSIS — Z8739 Personal history of other diseases of the musculoskeletal system and connective tissue: Secondary | ICD-10-CM | POA: Diagnosis not present

## 2020-11-25 DIAGNOSIS — G894 Chronic pain syndrome: Secondary | ICD-10-CM | POA: Diagnosis not present

## 2020-11-25 DIAGNOSIS — M25511 Pain in right shoulder: Secondary | ICD-10-CM

## 2020-11-25 DIAGNOSIS — G8929 Other chronic pain: Secondary | ICD-10-CM

## 2020-11-25 DIAGNOSIS — M25512 Pain in left shoulder: Secondary | ICD-10-CM

## 2020-11-25 NOTE — Progress Notes (Signed)
Patient: Loretta Webb  Service Category: E/M  Provider: Gillis Santa, MD  DOB: 1931-10-04  DOS: 11/25/2020  Location: Office  MRN: [id number]  Setting: Ambulatory outpatient  Referring Provider: Rusty Aus, MD  Type: Established Patient  Specialty: Interventional Pain Management  PCP: Rusty Aus, MD  Location: Home  Delivery: TeleHealth     Virtual Encounter - Pain Management PROVIDER NOTE: Information contained herein reflects review and annotations entered in association with encounter. Interpretation of such information and data should be left to medically-trained personnel. Information provided to patient can be located elsewhere in the medical record under "Patient Instructions". Document created using STT-dictation technology, any transcriptional errors that may result from process are unintentional.    Contact & Pharmacy Preferred: [phone] Home: [phone] (home) Mobile: There is no such number on file (mobile). E-mail: [email or web address] .[facility] ([facility]) - [address] [place] [address] Phone: [phone] Fax: [phone]   Pre-screening  Loretta Webb offered "in-person" vs "virtual" encounter. She indicated preferring virtual for this encounter.   Reason COVID-19*  Social distancing based on CDC and AMA recommendations.   I contacted [facility] on 11/25/2020 via video conference.      I clearly identified myself as Gillis Santa, MD. I verified that I was speaking with the correct person using two identifiers (Name: Loretta Webb, and date of birth: 14-Jan-1932).  Consent I sought verbal advanced consent from Loretta Webb for virtual visit interactions. I informed Loretta Webb of possible security and privacy concerns, risks, and limitations associated with providing "not-in-person" medical evaluation and management services. I also informed Loretta Webb of the  availability of "in-person" appointments. Finally, I informed her that there would be a charge for the virtual visit and that she could be  personally, fully or partially, financially responsible for it. Loretta Webb expressed understanding and agreed to proceed.   Historic Elements   Loretta Webb is a 87 y.o. year old, female patient evaluated today after our last contact on 11/04/2020. Loretta Webb  has a past medical history of Arthritis, Chronic fatigue, DM2 (diabetes mellitus, type 2) ([place]), Esophageal stricture, Fibromyalgia, GERD (gastroesophageal reflux disease), Hypertension, Lumbar stenosis, Osteoporosis, and Sleep apnea. She also  has a past surgical history that includes knee replacements; Abdominal hysterectomy; Rotator cuff repair; and Appendectomy. Loretta Webb has a current medication list which includes the following prescription(s): acetaminophen, aspirin, bumetanide, cholecalciferol, vitamin b-12, diclofenac sodium, donepezil, duloxetine, hydrocodone-acetaminophen, magnesium, metformin, mirtazapine, pantoprazole, quetiapine, alprazolam, apixaban, aspirin, digoxin, diltiazem, losartan, meloxicam, pravastatin, propranolol, and trazodone. She  reports that she has never smoked. She has never used smokeless tobacco. She reports that she does not drink alcohol and does not use drugs. Loretta Webb has No Known Allergies.   HPI  Today, she is being contacted for a post-procedure assessment.   Post-Procedure Evaluation  Procedure (11/04/2020):   Type: Diagnostic Suprascapular nerve Block          Primary Purpose: Diagnostic Region: Posterior Shoulder & Scapular Areas Level: Superior to the scapular spine, in the lateral aspect of the supraspinatus fossa (Suprescapular notch). Target Area: Suprascapular nerve as it passes thru the lower portion of the suprascapular notch. Approach: Posterior percutaneous approach. Laterality: Right-Side  Effectiveness during initial hour after procedure  (Ultra-Short Term Relief): 40 %   Local anesthetic used: Long-acting (4-6 hours) Effectiveness: Defined as any analgesic benefit obtained secondary to the administration of  local anesthetics. This carries significant diagnostic value as to the etiological location, or anatomical origin, of the pain. Duration of benefit is expected to coincide with the duration of the local anesthetic used.  Effectiveness during initial 4-6 hours after procedure (Short-Term Relief): 40 %   Long-term benefit: Defined as any relief past the pharmacologic duration of the local anesthetics.  Effectiveness past the initial 6 hours after procedure (Long-Term Relief): 50%  Benefits, current: Defined as benefit present at the time of this evaluation.   Analgesia:  0%   Laboratory Chemistry Profile   Renal Lab Results  Component Value Date   BUN 14 09/03/2019   CREATININE 1.44 (H) 09/03/2019   BCR 10 (L) 09/03/2019   GFRAA 38 (L) 09/03/2019   GFRNONAA 33 (L) 09/03/2019    Hepatic Lab Results  Component Value Date   AST 12 09/03/2019   ALT 10 (L) 11/25/2016   ALBUMIN 4.3 09/03/2019   ALKPHOS 50 09/03/2019   LIPASE 42 09/11/2015    Electrolytes Lab Results  Component Value Date   NA 140 09/03/2019   K 4.1 09/03/2019   CL 104 09/03/2019   CALCIUM 9.8 09/03/2019   MG 2.0 09/03/2019    Bone Lab Results  Component Value Date   25OHVITD1 39 09/03/2019   25OHVITD2 3.4 09/03/2019   25OHVITD3 36 09/03/2019    Inflammation (CRP: Acute Phase) (ESR: Chronic Phase) Lab Results  Component Value Date   CRP <1 09/03/2019   ESRSEDRATE 17 09/03/2019         Note: Above Lab results reviewed.  Assessment  The primary encounter diagnosis was Localized osteoarthritis of shoulder regions, bilateral. Diagnoses of History of rotator cuff syndrome (bilateral), Chronic shoulder pain (3ry area of Pain) (Bilateral) (L>R), and Chronic pain syndrome were also pertinent to this visit.  Plan of Care    Loretta Webb has a current medication list which includes the following long-term medication(s): bumetanide, donepezil, duloxetine, metformin, mirtazapine, pantoprazole, quetiapine, apixaban, digoxin, diltiazem, losartan, pravastatin, propranolol, and trazodone.   Loretta Webb continues to have persistent right shoulder pain.  Her right suprascapular nerve block was not all that helpful.  However, her left suprascapular nerve block for left shoulder pain provide significant pain relief.  Given continued pain of her right shoulder as well as difficulty sleeping related to the pain, recommend right posterior shoulder steroid injection with a long-acting steroid.  Risks and benefits reviewed and patient would like to proceed.  Orders:  Orders Placed This Encounter  Procedures   SHOULDER INJECTION    Standing Status:   Future    Standing Expiration Date:   01/26/2021    Scheduling Instructions:     Procedure: Intra-articular shoulder (Glenohumeral) joint injection     Side: RIGHT     Level: Glenohumeral joint               Sedation: Patient's choice.     Timeframe: As permitted by the schedule    Order Specific Question:   Where will this procedure be performed?    Answer:   [facility]   SHOULDER INJECTION    Standing Status:   Future    Standing Expiration Date:   01/26/2021    Scheduling Instructions:     Procedure: Intra-articular shoulder (Glenohumeral) joint injection     Side: RIGHT     Level: Glenohumeral joint               Sedation: Patient's choice.       Timeframe: As permitted by the schedule    Order Specific Question:   Where will this procedure be performed?    Answer:   [facility]    Follow-up plan:    Recent Visits Date Type Provider Dept  11/04/20 Procedure visit , , MD [facility]  09/30/20 Procedure visit , , MD [facility]  Showing recent visits within past 90 days and meeting all other requirements Today's  Visits Date Type Provider Dept  11/25/20 Telemedicine , , MD [facility]  Showing today's visits and meeting all other requirements Future Appointments No visits were found meeting these conditions. Showing future appointments within next 90 days and meeting all other requirements I discussed the assessment and treatment plan with the patient. The patient was provided an opportunity to ask questions and all were answered. The patient agreed with the plan and demonstrated an understanding of the instructions.  Patient advised to call back or seek an in-person evaluation if the symptoms or condition worsens.  Duration of encounter: 20minutes.  Note by:  , MD Date: 11/25/2020; Time: 2:46 PM  

## 2020-12-02 ENCOUNTER — Ambulatory Visit
Admission: RE | Admit: 2020-12-02 | Discharge: 2020-12-02 | Disposition: A | Discharge status: Home / Self Care | Payer: Medicare Other | Source: Ambulatory Visit | Attending: Student in an Organized Health Care Education/Training Program | Admitting: Student in an Organized Health Care Education/Training Program

## 2020-12-02 ENCOUNTER — Ambulatory Visit (HOSPITAL_BASED_OUTPATIENT_CLINIC_OR_DEPARTMENT_OTHER): Payer: Medicare Other | Admitting: Student in an Organized Health Care Education/Training Program

## 2020-12-02 ENCOUNTER — Encounter: Payer: Self-pay | Admitting: Student in an Organized Health Care Education/Training Program

## 2020-12-02 ENCOUNTER — Other Ambulatory Visit: Payer: Self-pay

## 2020-12-02 VITALS — BP 143/102 | HR 136 | Temp 97.0°F | Resp 22 | Ht 63.0 in | Wt 140.0 lb

## 2020-12-02 DIAGNOSIS — Z8739 Personal history of other diseases of the musculoskeletal system and connective tissue: Secondary | ICD-10-CM

## 2020-12-02 DIAGNOSIS — G894 Chronic pain syndrome: Secondary | ICD-10-CM

## 2020-12-02 DIAGNOSIS — M19012 Primary osteoarthritis, left shoulder: Secondary | ICD-10-CM | POA: Insufficient documentation

## 2020-12-02 DIAGNOSIS — M19011 Primary osteoarthritis, right shoulder: Secondary | ICD-10-CM | POA: Insufficient documentation

## 2020-12-02 MED ORDER — ROPIVACAINE HCL 2 MG/ML IJ SOLN
4.0000 mL | Freq: Once | INTRAMUSCULAR | Status: AC
  Administered 2020-12-02: 4 mL via INTRA_ARTICULAR

## 2020-12-02 MED ORDER — METHYLPREDNISOLONE ACETATE 40 MG/ML IJ SUSP
40.0000 mg | Freq: Once | INTRAMUSCULAR | Status: AC
  Administered 2020-12-02: 40 mg via INTRA_ARTICULAR
  Filled 2020-12-02: qty 1

## 2020-12-02 MED ORDER — IOHEXOL 180 MG/ML  SOLN
10.0000 mL | Freq: Once | INTRAMUSCULAR | Status: AC
  Administered 2020-12-02: 5 mL via INTRA_ARTICULAR

## 2020-12-02 NOTE — Progress Notes (Signed)
PROVIDER NOTE: Information contained herein reflects review and annotations entered in association with encounter. Interpretation of such information and data should be left to medically-trained personnel. Information provided to patient can be located elsewhere in the medical record under "Patient Instructions". Document created using STT-dictation technology, any transcriptional errors that may result from process are unintentional.    Patient: Loretta Webb  Service Category: Procedure  Provider: Edward Jolly, MD  DOB: 1932-01-25  DOS: 12/02/2020  Location: [facility]  MRN: [id number]  Setting: Ambulatory - outpatient  Referring Provider: [name], MD  Type: Established Patient  Specialty: Interventional Pain Management  PCP: [name], MD   Primary Reason for Visit: Interventional Pain Management Treatment. CC: Shoulder Pain  Procedure:          Anesthesia, Analgesia, Anxiolysis:  Type: Therapeutic Glenohumeral Joint (shoulder) Injection          Primary Purpose: Diagnostic/Therapeutric Region: Superior Shoulder Area Level:  Shoulder Target Area: Glenohumeral Joint (shoulder) Approach: Posterior approach. Laterality: Right-Sided  Type: Local Anesthesia  Local Anesthetic: Lidocaine 1-2%  Position: Prone   Indications: 1. History of rotator cuff syndrome (bilateral)   2. Localized osteoarthritis of shoulder regions, bilateral   3. Chronic pain syndrome    Pain Score: Pre-procedure: 8 /10 Post-procedure: 8 /10   Pre-op H&P Assessment:  Loretta Webb is a 87 y.o. (year old), female patient, seen today for interventional treatment. She  has a past surgical history that includes knee replacements; Abdominal hysterectomy; Rotator cuff repair; and Appendectomy. Loretta Webb has a current medication list which includes the following prescription(s): acetaminophen, aspirin, bumetanide, cholecalciferol, vitamin b-12, diclofenac sodium, donepezil, duloxetine,  hydrocodone-acetaminophen, magnesium, metformin, mirtazapine, pantoprazole, quetiapine, alprazolam, apixaban, aspirin, digoxin, diltiazem, losartan, meloxicam, pravastatin, propranolol, and trazodone. Her primarily concern today is the Shoulder Pain  Initial Vital Signs:  Pulse/HCG Rate: (!) 137  Temp: (!) 97 F (36.1 C) Resp: 20 BP: (!) 121/98 SpO2: 98 %  BMI: Estimated body mass index is 24.8 kg/m as calculated from the following:   Height as of this encounter: 5\' 3"  (1.6 m).   Weight as of this encounter: 140 lb (63.5 kg).  Risk Assessment: Allergies: Reviewed. She has No Known Allergies.  Allergy Precautions: None required Coagulopathies: Reviewed. None identified.  Blood-thinner therapy: None at this time Active Infection(s): Reviewed. None identified. Loretta Webb is afebrile  Site Confirmation: Loretta Webb was asked to confirm the procedure and laterality before marking the site Procedure checklist: Completed Consent: Before the procedure and under the influence of no sedative(s), amnesic(s), or anxiolytics, the patient was informed of the treatment options, risks and possible complications. To fulfill our ethical and legal obligations, as recommended by the American Medical Association's Code of Ethics, I have informed the patient of my clinical impression; the nature and purpose of the treatment or procedure; the risks, benefits, and possible complications of the intervention; the alternatives, including doing nothing; the risk(s) and benefit(s) of the alternative treatment(s) or procedure(s); and the risk(s) and benefit(s) of doing nothing. The patient was provided information about the general risks and possible complications associated with the procedure. These may include, but are not limited to: failure to achieve desired goals, infection, bleeding, organ or nerve damage, allergic reactions, paralysis, and death. In addition, the patient was informed of those risks and  complications associated to the procedure, such as failure to decrease pain; infection; bleeding; organ or nerve damage with subsequent damage to sensory, motor, and/or autonomic systems, resulting in permanent  pain, numbness, and/or weakness of one or several areas of the body; allergic reactions; (i.e.: anaphylactic reaction); and/or death. Furthermore, the patient was informed of those risks and complications associated with the medications. These include, but are not limited to: allergic reactions (i.e.: anaphylactic or anaphylactoid reaction(s)); adrenal axis suppression; blood sugar elevation that in diabetics may result in ketoacidosis or comma; water retention that in patients with history of congestive heart failure may result in shortness of breath, pulmonary edema, and decompensation with resultant heart failure; weight gain; swelling or edema; medication-induced neural toxicity; particulate matter embolism and blood vessel occlusion with resultant organ, and/or nervous system infarction; and/or aseptic necrosis of one or more joints. Finally, the patient was informed that Medicine is not an exact science; therefore, there is also the possibility of unforeseen or unpredictable risks and/or possible complications that may result in a catastrophic outcome. The patient indicated having understood very clearly. We have given the patient no guarantees and we have made no promises. Enough time was given to the patient to ask questions, all of which were answered to the patient's satisfaction. Loretta Webb has indicated that she wanted to continue with the procedure. Attestation: I, the ordering provider, attest that I have discussed with the patient the benefits, risks, side-effects, alternatives, likelihood of achieving goals, and potential problems during recovery for the procedure that I have provided informed consent. Date  Time: 12/02/2020 10:11 AM  Pre-Procedure Preparation:  Monitoring: As per  clinic protocol. Respiration, ETCO2, SpO2, BP, heart rate and rhythm monitor placed and checked for adequate function Safety Precautions: Patient was assessed for positional comfort and pressure points before starting the procedure. Time-out: I initiated and conducted the "Time-out" before starting the procedure, as per protocol. The patient was asked to participate by confirming the accuracy of the "Time Out" information. Verification of the correct person, site, and procedure were performed and confirmed by me, the nursing staff, and the patient. "Time-out" conducted as per Joint Commission's Universal Protocol (UP.01.01.01). Time: 1051  Description of Procedure:          Area Prepped: Entire shoulder Area DuraPrep (Iodine Povacrylex [0.7% available iodine] and Isopropyl Alcohol, 74% w/w) Safety Precautions: Aspiration looking for blood return was conducted prior to all injections. At no point did we inject any substances, as a needle was being advanced. No attempts were made at seeking any paresthesias. Safe injection practices and needle disposal techniques used. Medications properly checked for expiration dates. SDV (single dose vial) medications used. Description of the Procedure: Protocol guidelines were followed. The patient was placed in position over the procedure table. The target area was identified and the area prepped in the usual manner. Skin & deeper tissues infiltrated with local anesthetic. Appropriate amount of time allowed to pass for local anesthetics to take effect. The procedure needles were then advanced to the target area. Proper needle placement secured. Negative aspiration confirmed. Solution injected in intermittent fashion, asking for systemic symptoms every 0.5cc of injectate. The needles were then removed and the area cleansed, making sure to leave some of the prepping solution back to take advantage of its long term bactericidal properties.         Vitals:   12/02/20  1037 12/02/20 1043 12/02/20 1050 12/02/20 1055  BP: (!) 134/91 (!) 127/97 (!) 139/110 (!) 143/102  Pulse: (!) 150 (!) 137 (!) 132 (!) 136  Resp: 16 18 20  (!) 22  Temp:      TempSrc:      SpO2: 99% 98%  100% 99%  Weight:      Height:        Start Time: 1051 hrs. End Time: 1054 hrs. Materials:  Needle(s) Type: Spinal Needle Gauge: 22G Length: 3.5-in Medication(s): Please see orders for medications and dosing details. 5cc solution made of 4 cc of 0.2% ropivacaine, 1 cc of Decadron 10 mg/cc.  Imaging Guidance (Non-Spinal):          Type of Imaging Technique: Fluoroscopy Guidance (Non-Spinal) Indication(s): Assistance in needle guidance and placement for procedures requiring needle placement in or near specific anatomical locations not easily accessible without such assistance. Exposure Time: Please see nurses notes. Contrast: Before injecting any contrast, we confirmed that the patient did not have an allergy to iodine, shellfish, or radiological contrast. Once satisfactory needle placement was completed at the desired level, radiological contrast was injected. Contrast injected under live fluoroscopy. No contrast complications. See chart for type and volume of contrast used. Fluoroscopic Guidance: I was personally present during the use of fluoroscopy. "Tunnel Vision Technique" used to obtain the best possible view of the target area. Parallax error corrected before commencing the procedure. "Direction-depth-direction" technique used to introduce the needle under continuous pulsed fluoroscopy. Once target was reached, antero-posterior, oblique, and lateral fluoroscopic projection used confirm needle placement in all planes. Images permanently stored in EMR. Interpretation: I personally interpreted the imaging intraoperatively. Adequate needle placement confirmed in multiple planes. Appropriate spread of contrast into desired area was observed. No evidence of afferent or efferent intravascular  uptake. Permanent images saved into the patient's record.   Post-operative Assessment:  Post-procedure Vital Signs:  Pulse/HCG Rate: (!) 136  Temp: (!) 97 F (36.1 C) Resp: (!) 22 BP: (!) 143/102 SpO2: 99 %  EBL: None  Complications: No immediate post-treatment complications observed by team, or reported by patient.  Note: The patient tolerated the entire procedure well. A repeat set of vitals were taken after the procedure and the patient was kept under observation following institutional policy, for this type of procedure. Post-procedural neurological assessment was performed, showing return to baseline, prior to discharge. The patient was provided with post-procedure discharge instructions, including a section on how to identify potential problems. Should any problems arise concerning this procedure, the patient was given instructions to immediately contact us, at any time, without hesitation. In any case, we plan to contact the patient by telephone for a follow-up status report regarding this interventional procedure.  Comments:  No additional relevant information.  Plan of Care  Orders:  Orders Placed This Encounter  Procedures   DG PAIN [facility] C-ARM 1-60 MIN NO REPORT    Intraoperative interpretation by procedural physician at [facility].    Standing Status:   Standing    Number of Occurrences:   1    Order Specific Question:   Reason for exam:    Answer:   Assistance in needle guidance and placement for procedures requiring needle placement in or near specific anatomical locations not easily accessible without such assistance.     Medications ordered for procedure: Meds ordered this encounter  Medications   iohexol (OMNIPAQUE) 180 MG/ML injection 10 mL    Must be Myelogram-compatible. If not available, you may substitute with a water-soluble, non-ionic, hypoallergenic, myelogram-compatible radiological contrast medium.   methylPREDNISolone acetate  (DEPO-MEDROL) injection 40 mg   ropivacaine (PF) 2 mg/mL (0.2%) (NAROPIN) injection 4 mL    Medications administered: We administered iohexol, methylPREDNISolone acetate, and ropivacaine (PF) 2 mg/mL (0.2%).  See the medical record for exact  dosing, route, and time of administration.  Return in about 4 weeks (around 12/30/2020) for Post Procedure Evaluation, virtual.       Recent Visits Date Type Provider Dept  11/25/20 Telemedicine Edward JollyLateef, Faruq Rosenberger, MD [facility]  11/04/20 Procedure visit Edward JollyLateef, Sohil Timko, MD [facility]  09/30/20 Procedure visit Edward JollyLateef, Lety Cullens, MD [facility]  Showing recent visits within past 90 days and meeting all other requirements Today's Visits Date Type Provider Dept  12/02/20 Procedure visit Edward JollyLateef, Shiron Whetsel, MD [facility]  Showing today's visits and meeting all other requirements Future Appointments Date Type Provider Dept  12/16/20 Appointment Edward JollyLateef, Jadarious Dobbins, MD [facility]  Showing future appointments within next 90 days and meeting all other requirements Disposition: Discharge home  Discharge (Date  Time): 12/02/2020; 1105 hrs.   Primary Care Physician: Danella PentonMiller, Mark F, MD [facility] Note by: Edward JollyBilal Shanaye Rief, MD Date: 12/02/2020; Time: 11:07 AM  Disclaimer:  Medicine is not an exact science. The only guarantee in medicine is that nothing is guaranteed. It is important to note that the decision to proceed with this intervention was based on the information collected from the patient. The Data and conclusions were drawn from the patient's questionnaire, the interview, and the physical examination. Because the information was provided in large part by the patient, it cannot be guaranteed that it has not been purposely or unconsciously manipulated. Every effort has been made to obtain as much relevant data as possible for this evaluation. It is important to note that the  conclusions that lead to this procedure are derived in large part from the available data. Always take into account that the treatment will also be dependent on availability of resources and existing treatment guidelines, considered by other Pain Management Practitioners as being common knowledge and practice, at the time of the intervention. For Medico-Legal purposes, it is also important to point out that variation in procedural techniques and pharmacological choices are the acceptable norm. The indications, contraindications, technique, and results of the above procedure should only be interpreted and judged by a Board-Certified Interventional Pain Specialist with extensive familiarity and expertise in the same exact procedure and technique.

## 2020-12-02 NOTE — Patient Instructions (Signed)

## 2020-12-02 NOTE — Progress Notes (Signed)
Safety precautions to be maintained throughout the outpatient stay will include: orient to surroundings, keep bed in low position, maintain call bell within reach at all times, provide assistance with transfer out of bed and ambulation.  

## 2020-12-03 ENCOUNTER — Telehealth: Payer: Self-pay | Admitting: *Deleted

## 2020-12-03 NOTE — Telephone Encounter (Signed)
Called for post procedure check. No answer. LVM. 

## 2020-12-15 ENCOUNTER — Encounter: Payer: Self-pay | Admitting: Student in an Organized Health Care Education/Training Program

## 2020-12-16 ENCOUNTER — Other Ambulatory Visit: Payer: Self-pay

## 2020-12-16 ENCOUNTER — Telehealth: Payer: Self-pay

## 2020-12-16 ENCOUNTER — Ambulatory Visit
Payer: Medicare Other | Attending: Student in an Organized Health Care Education/Training Program | Admitting: Student in an Organized Health Care Education/Training Program

## 2020-12-16 DIAGNOSIS — M25511 Pain in right shoulder: Secondary | ICD-10-CM | POA: Diagnosis not present

## 2020-12-16 DIAGNOSIS — M47818 Spondylosis without myelopathy or radiculopathy, sacral and sacrococcygeal region: Secondary | ICD-10-CM | POA: Diagnosis not present

## 2020-12-16 DIAGNOSIS — M19011 Primary osteoarthritis, right shoulder: Secondary | ICD-10-CM | POA: Diagnosis not present

## 2020-12-16 DIAGNOSIS — Z8739 Personal history of other diseases of the musculoskeletal system and connective tissue: Secondary | ICD-10-CM | POA: Diagnosis not present

## 2020-12-16 DIAGNOSIS — M19012 Primary osteoarthritis, left shoulder: Secondary | ICD-10-CM

## 2020-12-16 DIAGNOSIS — M47816 Spondylosis without myelopathy or radiculopathy, lumbar region: Secondary | ICD-10-CM

## 2020-12-16 DIAGNOSIS — G894 Chronic pain syndrome: Secondary | ICD-10-CM

## 2020-12-16 DIAGNOSIS — M1611 Unilateral primary osteoarthritis, right hip: Secondary | ICD-10-CM

## 2020-12-16 DIAGNOSIS — M1612 Unilateral primary osteoarthritis, left hip: Secondary | ICD-10-CM

## 2020-12-16 DIAGNOSIS — G8929 Other chronic pain: Secondary | ICD-10-CM

## 2020-12-16 DIAGNOSIS — M25512 Pain in left shoulder: Secondary | ICD-10-CM

## 2020-12-16 NOTE — Progress Notes (Signed)
Patient: Loretta Webb  Service Category: E/M  Provider: Gillis Santa, MD  DOB: 1931/12/12  DOS: 12/16/2020  Location: Office  MRN: [id number]  Setting: Ambulatory outpatient  Referring Provider: Rusty Aus, MD  Type: Established Patient  Specialty: Interventional Pain Management  PCP: Rusty Aus, MD  Location: Home  Delivery: TeleHealth     Virtual Encounter - Pain Management PROVIDER NOTE: Information contained herein reflects review and annotations entered in association with encounter. Interpretation of such information and data should be left to medically-trained personnel. Information provided to patient can be located elsewhere in the medical record under "Patient Instructions". Document created using STT-dictation technology, any transcriptional errors that may result from process are unintentional.    Contact & Pharmacy Preferred: [phone] Home: [phone] (home) Mobile: There is no such number on file (mobile). E-mail: [email or web address] ([facility] - [address] [place] [address] Phone: [phone] Fax: [phone]   Pre-screening  Loretta Webb offered "in-person" vs "virtual" encounter. She indicated preferring virtual for this encounter.   Reason COVID-19*  Social distancing based on CDC and AMA recommendations.   I contacted Loretta Webb on 12/16/2020 via video conference.      I clearly identified myself as Gillis Santa, MD. I verified that I was speaking with the correct person using two identifiers (Name: Loretta Webb, and date of birth: 08-05-1931).  Consent I sought verbal advanced consent from Loretta Webb for virtual visit interactions. I informed Loretta Webb of possible security and privacy concerns, risks, and limitations associated with providing "not-in-person" medical evaluation and management services. I also informed Loretta Webb of the  availability of "in-person" appointments. Finally, I informed her that there would be a charge for the virtual visit and that she could be  personally, fully or partially, financially responsible for it. Loretta Webb expressed understanding and agreed to proceed.   Historic Elements   Loretta Webb is a 87 y.o. year old, female patient evaluated today after our last contact on 12/02/2020. Loretta Webb  has a past medical history of Arthritis, Chronic fatigue, DM2 (diabetes mellitus, type 2) ([place]), Esophageal stricture, Fibromyalgia, GERD (gastroesophageal reflux disease), Hypertension, Lumbar stenosis, Osteoporosis, and Sleep apnea. She also  has a past surgical history that includes knee replacements; Abdominal hysterectomy; Rotator cuff repair; and Appendectomy. Loretta Webb has a current medication list which includes the following prescription(s): acetaminophen, aspirin, bumetanide, cholecalciferol, vitamin b-12, diclofenac sodium, donepezil, duloxetine, magnesium, metformin, mirtazapine, pantoprazole, quetiapine, and pravastatin. She  reports that she has never smoked. She has never used smokeless tobacco. She reports that she does not drink alcohol and does not use drugs. Loretta Webb has No Known Allergies.   HPI  Today, she is being contacted for a post-procedure assessment.   Post-Procedure Evaluation  Procedure (12/02/2020):   Type: Therapeutic Glenohumeral Joint (shoulder) Injection          Primary Purpose: Diagnostic/Therapeutric Region: Superior Shoulder Area Level:  Shoulder Target Area: Glenohumeral Joint (shoulder) Approach: Posterior approach. Laterality: Right-Sided  Anxiolysis: Please see nurses note.  Effectiveness during initial hour after procedure (Ultra-Short Term Relief): 100 %   Local anesthetic used: Long-acting (4-6 hours) Effectiveness: Defined as any analgesic benefit obtained secondary to the administration of local anesthetics. This carries significant diagnostic  value as to the etiological location, or anatomical origin, of the pain. Duration of benefit is expected to coincide with the duration of  the local anesthetic used.  Effectiveness during initial 4-6 hours after procedure (Short-Term Relief): 100 %   Long-term benefit: Defined as any relief past the pharmacologic duration of the local anesthetics.  Effectiveness past the initial 6 hours after procedure (Long-Term Relief): 0 %   Benefits, current: Defined as benefit present at the time of this evaluation.   Analgesia:  No benefit  Laboratory Chemistry Profile   Renal Lab Results  Component Value Date   BUN 14 09/03/2019   CREATININE 1.44 (H) 09/03/2019   BCR 10 (L) 09/03/2019   GFRAA 38 (L) 09/03/2019   GFRNONAA 33 (L) 09/03/2019    Hepatic Lab Results  Component Value Date   AST 12 09/03/2019   ALT 10 (L) 11/25/2016   ALBUMIN 4.3 09/03/2019   ALKPHOS 50 09/03/2019   LIPASE 42 09/11/2015    Electrolytes Lab Results  Component Value Date   NA 140 09/03/2019   K 4.1 09/03/2019   CL 104 09/03/2019   CALCIUM 9.8 09/03/2019   MG 2.0 09/03/2019    Bone Lab Results  Component Value Date   25OHVITD1 39 09/03/2019   25OHVITD2 3.4 09/03/2019   25OHVITD3 36 09/03/2019    Inflammation (CRP: Acute Phase) (ESR: Chronic Phase) Lab Results  Component Value Date   CRP <1 09/03/2019   ESRSEDRATE 17 09/03/2019         Note: Above Lab results reviewed.   Assessment  The primary encounter diagnosis was History of rotator cuff syndrome (bilateral). Diagnoses of Localized osteoarthritis of shoulder regions, bilateral, Chronic shoulder pain (3ry area of Pain) (Bilateral) (L>R), SI joint arthritis, Primary osteoarthritis of left hip, Primary osteoarthritis of right hip, Chronic pain syndrome, Lumbar facet arthropathy, and Lumbar spondylosis were also pertinent to this visit.  Plan of Care   Postprocedural follow-up after right glenohumeral joint injection.  Unfortunately, the patient  did not receive any pain relief or improvement in functional status of the right shoulder since the injection.  She is also had a right suprascapular nerve block that it is reasonable for him.  Patient states that the majority of her pain seems to be soft tissue and musculoskeletal.  She is working with physical therapy again region.  She has been taking meloxicam which has been somewhat helpful.  We discussed the risks of chronic NSAID therapy and suggested that it may be better to take Tylenol arthritis instead since the patient already has compromised kidney function.  We also discussed her low back pain related to lumbar facet arthropathy, lumbar, lumbar spondylosis. Loretta Webb has a history of greater than 3 months of moderate to severe pain which is resulted in functional impairment.  The patient has tried various conservative therapeutic options such as NSAIDs, Tylenol, muscle relaxants, physical therapy which was inadequately effective.  Patient's pain is predominantly axial with radiographic and exam findings suggestive of facet arthropathy. Lumbar facet medial branch nerve blocks were discussed with the patient.  Risks and benefits were reviewed.  Patient will contact us when she would like to proceed with bilateral L3, L4, L5 medial branch nerve block.   Orders Placed This Encounter  Procedures   LUMBAR FACET(MEDIAL BRANCH NERVE BLOCK) MBNB    Standing Status:   Standing    Number of Occurrences:   2    Standing Expiration Date:   06/18/2021    Scheduling Instructions:     Procedure: Lumbar facet block (AKA.: Lumbosacral medial branch nerve block)     Side: Bilateral  Level: L3-4, L4-5, acets ( L3, L4, L5, Medial Branch Nerves)     Sedation: Patient's choice.     Timeframe: ASAA    Order Specific Question:   Where will this procedure be performed?    Answer:   [facility]      Follow-up plan:   Return for PRN lumbar facets.    Recent Visits Date Type Provider Dept   12/02/20 Procedure visit Gillis Santa, MD [facility]  11/25/20 Telemedicine Gillis Santa, MD [facility]  11/04/20 Procedure visit Gillis Santa, MD [facility]  09/30/20 Procedure visit Gillis Santa, MD [facility]  Showing recent visits within past 90 days and meeting all other requirements Today's Visits Date Type Provider Dept  12/16/20 Telemedicine Gillis Santa, MD [facility]  Showing today's visits and meeting all other requirements Future Appointments No visits were found meeting these conditions. Showing future appointments within next 90 days and meeting all other requirements I discussed the assessment and treatment plan with the patient. The patient was provided an opportunity to ask questions and all were answered. The patient agreed with the plan and demonstrated an understanding of the instructions.  Patient advised to call back or seek an in-person evaluation if the symptoms or condition worsens.  Duration of encounter: 50minutes.  Note by: Gillis Santa, MD Date: 12/16/2020; Time: 2:24 PM

## 2020-12-16 NOTE — Telephone Encounter (Signed)
Called patient and left message to have her call office to get pre procedure instructions.

## 2020-12-17 ENCOUNTER — Telehealth: Payer: Self-pay

## 2020-12-17 NOTE — Telephone Encounter (Signed)
Pt had a VV on yesterday 12/16/20 and called this morning stating that they did not want sedation for the procedure and is not quite ready to schedule it yet. However would like for it remain available for her to call and schedule when she wants to go ahead with it

## 2020-12-17 NOTE — Telephone Encounter (Signed)
OK I will make a note.

## 2021-01-05 ENCOUNTER — Encounter: Payer: Self-pay | Admitting: Emergency Medicine

## 2021-01-05 ENCOUNTER — Emergency Department: Payer: Medicare Other

## 2021-01-05 ENCOUNTER — Inpatient Hospital Stay
Admission: EM | Admit: 2021-01-05 | Discharge: 2021-01-12 | DRG: 309 | Disposition: A | Discharge status: Skilled Nursing Facility | Payer: Medicare Other | Attending: Internal Medicine | Admitting: Internal Medicine

## 2021-01-05 DIAGNOSIS — E876 Hypokalemia: Secondary | ICD-10-CM | POA: Diagnosis present

## 2021-01-05 DIAGNOSIS — H9190 Unspecified hearing loss, unspecified ear: Secondary | ICD-10-CM | POA: Diagnosis present

## 2021-01-05 DIAGNOSIS — G4733 Obstructive sleep apnea (adult) (pediatric): Secondary | ICD-10-CM | POA: Diagnosis present

## 2021-01-05 DIAGNOSIS — E782 Mixed hyperlipidemia: Secondary | ICD-10-CM | POA: Diagnosis present

## 2021-01-05 DIAGNOSIS — E1169 Type 2 diabetes mellitus with other specified complication: Secondary | ICD-10-CM | POA: Diagnosis present

## 2021-01-05 DIAGNOSIS — Z7984 Long term (current) use of oral hypoglycemic drugs: Secondary | ICD-10-CM

## 2021-01-05 DIAGNOSIS — E1142 Type 2 diabetes mellitus with diabetic polyneuropathy: Secondary | ICD-10-CM | POA: Diagnosis present

## 2021-01-05 DIAGNOSIS — K219 Gastro-esophageal reflux disease without esophagitis: Secondary | ICD-10-CM | POA: Diagnosis present

## 2021-01-05 DIAGNOSIS — I4821 Permanent atrial fibrillation: Secondary | ICD-10-CM | POA: Diagnosis not present

## 2021-01-05 DIAGNOSIS — Z794 Long term (current) use of insulin: Secondary | ICD-10-CM

## 2021-01-05 DIAGNOSIS — Z833 Family history of diabetes mellitus: Secondary | ICD-10-CM

## 2021-01-05 DIAGNOSIS — E872 Acidosis, unspecified: Secondary | ICD-10-CM | POA: Diagnosis present

## 2021-01-05 DIAGNOSIS — G894 Chronic pain syndrome: Secondary | ICD-10-CM | POA: Diagnosis present

## 2021-01-05 DIAGNOSIS — E860 Dehydration: Secondary | ICD-10-CM | POA: Diagnosis present

## 2021-01-05 DIAGNOSIS — D649 Anemia, unspecified: Secondary | ICD-10-CM | POA: Diagnosis present

## 2021-01-05 DIAGNOSIS — R296 Repeated falls: Secondary | ICD-10-CM | POA: Diagnosis present

## 2021-01-05 DIAGNOSIS — M797 Fibromyalgia: Secondary | ICD-10-CM | POA: Diagnosis present

## 2021-01-05 DIAGNOSIS — I10 Essential (primary) hypertension: Secondary | ICD-10-CM | POA: Diagnosis present

## 2021-01-05 DIAGNOSIS — R7989 Other specified abnormal findings of blood chemistry: Secondary | ICD-10-CM

## 2021-01-05 DIAGNOSIS — M810 Age-related osteoporosis without current pathological fracture: Secondary | ICD-10-CM | POA: Diagnosis present

## 2021-01-05 DIAGNOSIS — Z20822 Contact with and (suspected) exposure to covid-19: Secondary | ICD-10-CM | POA: Diagnosis present

## 2021-01-05 DIAGNOSIS — I4891 Unspecified atrial fibrillation: Secondary | ICD-10-CM | POA: Diagnosis present

## 2021-01-05 DIAGNOSIS — R55 Syncope and collapse: Secondary | ICD-10-CM | POA: Diagnosis not present

## 2021-01-05 DIAGNOSIS — Z7982 Long term (current) use of aspirin: Secondary | ICD-10-CM

## 2021-01-05 DIAGNOSIS — K5900 Constipation, unspecified: Secondary | ICD-10-CM | POA: Diagnosis present

## 2021-01-05 DIAGNOSIS — F0390 Unspecified dementia without behavioral disturbance: Secondary | ICD-10-CM | POA: Diagnosis present

## 2021-01-05 DIAGNOSIS — Z79899 Other long term (current) drug therapy: Secondary | ICD-10-CM

## 2021-01-05 DIAGNOSIS — N810 Urethrocele: Secondary | ICD-10-CM | POA: Diagnosis present

## 2021-01-05 DIAGNOSIS — R5381 Other malaise: Secondary | ICD-10-CM | POA: Diagnosis present

## 2021-01-05 DIAGNOSIS — I959 Hypotension, unspecified: Secondary | ICD-10-CM | POA: Diagnosis present

## 2021-01-05 DIAGNOSIS — Z9114 Patient's other noncompliance with medication regimen: Secondary | ICD-10-CM

## 2021-01-05 LAB — CBC WITH DIFFERENTIAL/PLATELET
Abs Immature Granulocytes: 0.06 10*3/uL (ref 0.00–0.07)
Basophils Absolute: 0.1 10*3/uL (ref 0.0–0.1)
Basophils Relative: 0 %
Eosinophils Absolute: 0.1 10*3/uL (ref 0.0–0.5)
Eosinophils Relative: 1 %
HCT: 33.8 % — ABNORMAL LOW (ref 36.0–46.0)
Hemoglobin: 10.8 g/dL — ABNORMAL LOW (ref 12.0–15.0)
Immature Granulocytes: 1 %
Lymphocytes Relative: 14 %
Lymphs Abs: 1.6 10*3/uL (ref 0.7–4.0)
MCH: 25.5 pg — ABNORMAL LOW (ref 26.0–34.0)
MCHC: 32 g/dL (ref 30.0–36.0)
MCV: 79.7 fL — ABNORMAL LOW (ref 80.0–100.0)
Monocytes Absolute: 0.9 10*3/uL (ref 0.1–1.0)
Monocytes Relative: 8 %
Neutro Abs: 9 10*3/uL — ABNORMAL HIGH (ref 1.7–7.7)
Neutrophils Relative %: 76 %
Platelets: 344 10*3/uL (ref 150–400)
RBC: 4.24 MIL/uL (ref 3.87–5.11)
RDW: 18.6 % — ABNORMAL HIGH (ref 11.5–15.5)
WBC: 11.7 10*3/uL — ABNORMAL HIGH (ref 4.0–10.5)
nRBC: 0 % (ref 0.0–0.2)

## 2021-01-05 LAB — PROTIME-INR
INR: 1.1 (ref 0.8–1.2)
Prothrombin Time: 13.9 s (ref 11.4–15.2)

## 2021-01-05 LAB — COMPREHENSIVE METABOLIC PANEL
ALT: 12 U/L (ref 0–44)
AST: 29 U/L (ref 15–41)
Albumin: 3.6 g/dL (ref 3.5–5.0)
Alkaline Phosphatase: 41 U/L (ref 38–126)
Anion gap: 12 (ref 5–15)
BUN: 23 mg/dL (ref 8–23)
CO2: 24 mmol/L (ref 22–32)
Calcium: 8.8 mg/dL — ABNORMAL LOW (ref 8.9–10.3)
Chloride: 100 mmol/L (ref 98–111)
Creatinine, Ser: 1.23 mg/dL — ABNORMAL HIGH (ref 0.44–1.00)
GFR, Estimated: 43 mL/min — ABNORMAL LOW (ref >60)
Glucose, Bld: 216 mg/dL — ABNORMAL HIGH (ref 70–99)
Potassium: 3.4 mmol/L — ABNORMAL LOW (ref 3.5–5.1)
Sodium: 136 mmol/L (ref 135–145)
Total Bilirubin: 0.6 mg/dL (ref 0.3–1.2)
Total Protein: 6.2 g/dL — ABNORMAL LOW (ref 6.5–8.1)

## 2021-01-05 LAB — URINALYSIS, COMPLETE (UACMP) WITH MICROSCOPIC
Bacteria, UA: NONE SEEN
Bilirubin Urine: NEGATIVE
Glucose, UA: NEGATIVE mg/dL
Hgb urine dipstick: NEGATIVE
Ketones, ur: NEGATIVE mg/dL
Nitrite: NEGATIVE
Protein, ur: NEGATIVE mg/dL
Specific Gravity, Urine: 1.016 (ref 1.005–1.030)
pH: 7 (ref 5.0–8.0)

## 2021-01-05 LAB — LACTIC ACID, PLASMA
Lactic Acid, Venous: 3.7 mmol/L (ref 0.5–1.9)
Lactic Acid, Venous: 2.6 mmol/L (ref 0.5–1.9)

## 2021-01-05 LAB — RESP PANEL BY RT-PCR (FLU A&B, COVID) ARPGX2
Influenza A by PCR: NEGATIVE
Influenza B by PCR: NEGATIVE
SARS Coronavirus 2 by RT PCR: NEGATIVE

## 2021-01-05 LAB — APTT: aPTT: 23 seconds — ABNORMAL LOW (ref 24–36)

## 2021-01-05 MED ORDER — LACTATED RINGERS IV BOLUS
500.0000 mL | Freq: Once | INTRAVENOUS | Status: AC
  Administered 2021-01-05: 500 mL via INTRAVENOUS

## 2021-01-05 MED ORDER — LACTATED RINGERS IV SOLN: INTRAVENOUS | Status: DC

## 2021-01-05 MED ORDER — VANCOMYCIN HCL IN DEXTROSE 1-5 GM/200ML-% IV SOLN
1000.0000 mg | Freq: Once | INTRAVENOUS | Status: AC
  Administered 2021-01-05: 1000 mg via INTRAVENOUS
  Filled 2021-01-05: qty 200

## 2021-01-05 MED ORDER — SODIUM CHLORIDE 0.9 % IV SOLN
2.0000 g | Freq: Once | INTRAVENOUS | Status: AC
  Administered 2021-01-05: 2 g via INTRAVENOUS
  Filled 2021-01-05: qty 2

## 2021-01-05 NOTE — ED Provider Notes (Signed)
[facility] Emergency Department Provider Note   ____________________________________________   I have reviewed the triage vital signs and the nursing notes.   HISTORY  Chief Complaint Syncope   History limited by: Not Limited   HPI Loretta Webb is a 87 y.o. female who presents to the emergency department today after a syncopal episode.  Patient states that she had had a normal day for her and felt her baseline health.  She did have a PT session earlier today.  She states shortly after that she felt like she tripped and fell onto the ground.  Not complaining of any significant trauma from that.  However later this evening as she got up she realized that she became exceedingly weak and then passed out.  She does not recall having any chest pain or palpitations prior to passing out.  Does not think she cause any significant injury when she has it.  When EMS arrived they found patient to be in A. fib with RVR.  They did administer metoprolol and started 500 cc of fluid.  Records reviewed. Per medical record review patient has a history of HTN, DM.  Past Medical History:  Diagnosis Date   Arthritis    Chronic fatigue    DM2 (diabetes mellitus, type 2) (HCC)    Esophageal stricture    Fibromyalgia    GERD (gastroesophageal reflux disease)    Hypertension    Lumbar stenosis    Osteoporosis    Sleep apnea     Patient Active Problem List   Diagnosis Date Noted   SI joint arthritis 06/24/2020   Chronic radicular lumbar pain 06/04/2020   Primary osteoarthritis of right hip 02/24/2020   Primary osteoarthritis of left hip 02/24/2020   Localized osteoarthritis of shoulder regions, bilateral 01/20/2020   History of rotator cuff syndrome (bilateral) 01/20/2020   Right hip pain 09/03/2019   Chronic low back pain (1ry area of Pain) (Bilateral) (R>L) w/o sciatica 09/03/2019   Chronic shoulder pain (3ry area of Pain) (Bilateral) (L>R) 09/03/2019   Chronic lower  extremity pain (4th area of Pain) (Bilateral) (R>L) 09/03/2019   Diabetic peripheral neuropathy (HCC) 09/03/2019   Neurogenic pain 09/03/2019   Lumbar facet syndrome (Bilateral) (R>L) 09/03/2019   Lumbar facet hypertrophy 09/03/2019   DDD (degenerative disc disease), lumbosacral 09/03/2019   Grade 1 Anterolisthesis of L3/L4 09/03/2019   Chronic pain syndrome 09/02/2019   Pharmacologic therapy 09/02/2019   Disorder of skeletal system 09/02/2019   Problems influencing health status 09/02/2019   Abnormal MRI, lumbar spine (01/13/2019) 09/02/2019   Abnormal MRI, cervical spine (2014) 09/02/2019   DM type 2 with diabetic mixed hyperlipidemia (HCC) 12/04/2018   Low serum vitamin D 05/29/2018   Nocturnal hypoxia 05/04/2016   Hyperlipidemia, mixed 10/28/2015   Cephalalgia 07/24/2015   Neuritis or radiculitis due to rupture of lumbar intervertebral disc 01/05/2015   DDD (degenerative disc disease), cervical 04/14/2014   B12 deficiency 12/23/2013   Benign essential HTN 12/23/2013   Type 2 diabetes mellitus (HCC) 12/23/2013   OSA (obstructive sleep apnea) 12/23/2013   Lumbar canal stenosis 09/18/2013   Lumbar central spinal stenosis w/ neurogenic claudication 09/18/2013    Past Surgical History:  Procedure Laterality Date   ABDOMINAL HYSTERECTOMY     APPENDECTOMY     knee replacements     ROTATOR CUFF REPAIR      Prior to Admission medications   Medication Sig Start Date End Date Taking? Authorizing Provider  acetaminophen (TYLENOL) 500 MG tablet Take  500 mg by mouth 2 (two) times daily as needed.    [provider]  aspirin 81 MG chewable tablet Chew by mouth daily.    [provider]  bumetanide (BUMEX) 1 MG tablet Take 1 mg by mouth daily.  04/24/14   [provider]  Cholecalciferol 25 MCG (1000 UT) tablet Take 1,000 Units by mouth once a week.    [provider]  Cyanocobalamin (VITAMIN B-12) 5000 MCG TBDP Take 5,000 mcg by mouth daily.     [provider]  diclofenac Sodium (VOLTAREN) 1 % GEL Apply topically 4 (four) times daily.    [provider]  donepezil (ARICEPT) 5 MG tablet Take 10 mg by mouth at bedtime.    [provider]  DULoxetine (CYMBALTA) 60 MG capsule Take 60 mg by mouth daily.  04/24/14   [provider]  Magnesium 250 MG TABS Take 250 mg by mouth daily.    [provider]  metFORMIN (GLUCOPHAGE) 1000 MG tablet Take 1,000 mg by mouth 2 (two) times daily with a meal.    [provider]  mirtazapine (REMERON) 15 MG tablet Take by mouth. 09/24/20   [provider]  pantoprazole (PROTONIX) 20 MG tablet Take 20 mg by mouth daily.    [provider]  pravastatin (PRAVACHOL) 40 MG tablet Take 40 mg by mouth at bedtime.  10/13/14 09/29/20  [provider]  QUEtiapine (SEROQUEL) 25 MG tablet Take 25 mg by mouth 2 (two) times daily.    [provider]    Allergies Patient has no known allergies.  Family History  Problem Relation Age of Onset   Diabetes Father    Cancer Father    Cerebral aneurysm Father    Cancer Mother    Cancer Sister    Depression Sister     Social History Social History   Tobacco Use   Smoking status: Never   Smokeless tobacco: Never  Vaping Use   Vaping Use: Every day  Substance Use Topics   Alcohol use: No    Alcohol/week: 0.0 standard drinks   Drug use: No    Review of Systems Constitutional: No fever/chills Eyes: No visual changes. ENT: No sore throat. Cardiovascular: Denies chest pain. Respiratory: Denies shortness of breath. Gastrointestinal: No abdominal pain.  No nausea, no vomiting.  No diarrhea.   Genitourinary: Negative for dysuria. Musculoskeletal: Negative for back pain. Skin: Positive for skin tears.  Neurological: Positive for syncopal episode.   ____________________________________________   PHYSICAL EXAM:  VITAL SIGNS: ED Triage Vitals [01/05/21 1942]  Enc Vitals  Group     BP (!) 90/52     Pulse Rate 82     Resp (!) 21     Temp (!) 97.4 F (36.3 C)     Temp Source Oral     SpO2 96 %   Constitutional: Alert and oriented.  Eyes: Conjunctivae are normal.  ENT      Head: Normocephalic and atraumatic.      Nose: No congestion/rhinnorhea.      Mouth/Throat: Mucous membranes are moist.      Neck: No stridor. Hematological/Lymphatic/Immunilogical: No cervical lymphadenopathy. Cardiovascular: Tachycardic, irregular rhythm.  No murmurs, rubs, or gallops.  Respiratory: Normal respiratory effort without tachypnea nor retractions. Breath sounds are clear and equal bilaterally. No wheezes/rales/rhonchi. Gastrointestinal: Soft and non tender. No rebound. No guarding.  Genitourinary: Deferred Musculoskeletal: Normal range of motion in all extremities. No lower extremity edema. No hip tenderness Neurologic:  Normal speech  and language. No gross focal neurologic deficits are appreciated.  Skin:  Skin tears to right forearm and right lower leg.  Psychiatric: Mood and affect are normal. Speech and behavior are normal. Patient exhibits appropriate insight and judgment.  ____________________________________________    LABS (pertinent positives/negatives)  Lactic acid 3.7 CMP na 136, k 3.4, glu 216, cr 1.23 CBC wbc 11.7, hgb 10.8, plt 344 COVID negative ____________________________________________   EKG  I, Phineas Semen, attending physician, personally viewed and interpreted this EKG  EKG Time: 1946 Rate: 100 Rhythm: atrial fibrillation Axis: normal Intervals: qtc 476 QRS: narrow ST changes: no st elevation Impression: abnormal ekg ____________________________________________    RADIOLOGY  CXR No acute abnormality  CT head/cervical spine No acute abnormality ____________________________________________   PROCEDURES  Procedures  ____________________________________________   INITIAL IMPRESSION / ASSESSMENT AND PLAN / ED  COURSE  Pertinent labs & imaging results that were available during my care of the patient were reviewed by me and considered in my medical decision making (see chart for details).   Patient presented to the emergency department today after a syncopal episode at home.  Patient's EKG does show atrial fibrillation.  Rate is well controlled.  Patient did receive metoprolol by EMS to help control the rate.  Patient did have hypotension here in the emergency department.  Additionally found to have lactic acidosis.  This time it is unclear if these findings are secondary to infection or simply dehydration.  Regardless patient was started on broad-spectrum IV antibiotics in case it was due to infection.  Chest x-ray and urine without signs of infection.  Will plan on admission to the hospital service. ____________________________________________   FINAL CLINICAL IMPRESSION(S) / ED DIAGNOSES  Final diagnoses:  Syncope, unspecified syncope type  Hypotension, unspecified hypotension type  Elevated lactic acid level     Note: This dictation was prepared with Dragon dictation. Any transcriptional errors that result from this process are unintentional     Phineas Semen, MD 01/05/21 [phone]

## 2021-01-05 NOTE — ED Notes (Signed)
Spoke with pt's daughter, Eunice Blase, and provided an update. Requested that she be called in the am so that she will know pt's location. Advised daughter that she would receive a call in the am around shift change.               **  [phone]  **

## 2021-01-05 NOTE — ED Notes (Signed)
Pt return from CT.

## 2021-01-05 NOTE — ED Notes (Signed)
Port CXR performed 

## 2021-01-05 NOTE — ED Notes (Signed)
Critical Lactic 3.7--MD notified and aware

## 2021-01-05 NOTE — ED Notes (Signed)
Assisted pt to the toilet to obtain urine sample 

## 2021-01-05 NOTE — ED Notes (Signed)
Blood cultures drawn from the right hand at 2140 and the left [facility] at 2145, labeled and sent to the lab.

## 2021-01-05 NOTE — ED Triage Notes (Signed)
Pt arrived via [facility] from home where she had 1 witnessed syncopal fall this morning post physical therapy and another unwitnessed fall this evening. Per EMS pt was not seen this AM, post fall. Pt found to be in AFIB RVR at rate of 180bpm by EMS, given 10mg  Metoprolol and RA oxygen sat at 92%, placed on 2L oxygen for route. Pt arrived to ED A&O x4, Hypotensive 90/57 post NS, RA sat 96%, HR at 110bpm. Pt has multiple stages of bruising seen on lower and upper extremities as well as new skin abrasions to the right forearm and right lower leg. Pt able to move all extremities. 

## 2021-01-06 ENCOUNTER — Other Ambulatory Visit: Payer: Self-pay

## 2021-01-06 ENCOUNTER — Observation Stay (HOSPITAL_COMMUNITY)
Admit: 2021-01-06 | Discharge: 2021-01-06 | Disposition: A | Discharge status: Home / Self Care | Payer: Medicare Other | Attending: Internal Medicine | Admitting: Internal Medicine

## 2021-01-06 DIAGNOSIS — A419 Sepsis, unspecified organism: Secondary | ICD-10-CM | POA: Diagnosis not present

## 2021-01-06 DIAGNOSIS — E860 Dehydration: Secondary | ICD-10-CM | POA: Diagnosis present

## 2021-01-06 DIAGNOSIS — D649 Anemia, unspecified: Secondary | ICD-10-CM | POA: Diagnosis present

## 2021-01-06 DIAGNOSIS — K219 Gastro-esophageal reflux disease without esophagitis: Secondary | ICD-10-CM | POA: Diagnosis present

## 2021-01-06 DIAGNOSIS — I4821 Permanent atrial fibrillation: Secondary | ICD-10-CM | POA: Diagnosis present

## 2021-01-06 DIAGNOSIS — I4891 Unspecified atrial fibrillation: Secondary | ICD-10-CM | POA: Diagnosis not present

## 2021-01-06 DIAGNOSIS — Z9114 Patient's other noncompliance with medication regimen: Secondary | ICD-10-CM | POA: Diagnosis not present

## 2021-01-06 DIAGNOSIS — E1142 Type 2 diabetes mellitus with diabetic polyneuropathy: Secondary | ICD-10-CM | POA: Diagnosis present

## 2021-01-06 DIAGNOSIS — I959 Hypotension, unspecified: Secondary | ICD-10-CM | POA: Diagnosis present

## 2021-01-06 DIAGNOSIS — R55 Syncope and collapse: Secondary | ICD-10-CM | POA: Diagnosis present

## 2021-01-06 DIAGNOSIS — E872 Acidosis, unspecified: Secondary | ICD-10-CM | POA: Diagnosis present

## 2021-01-06 DIAGNOSIS — Z20822 Contact with and (suspected) exposure to covid-19: Secondary | ICD-10-CM | POA: Diagnosis present

## 2021-01-06 DIAGNOSIS — Z794 Long term (current) use of insulin: Secondary | ICD-10-CM | POA: Diagnosis not present

## 2021-01-06 DIAGNOSIS — F0390 Unspecified dementia without behavioral disturbance: Secondary | ICD-10-CM | POA: Diagnosis present

## 2021-01-06 DIAGNOSIS — E876 Hypokalemia: Secondary | ICD-10-CM | POA: Diagnosis present

## 2021-01-06 DIAGNOSIS — Z833 Family history of diabetes mellitus: Secondary | ICD-10-CM | POA: Diagnosis not present

## 2021-01-06 DIAGNOSIS — Z79899 Other long term (current) drug therapy: Secondary | ICD-10-CM | POA: Diagnosis not present

## 2021-01-06 DIAGNOSIS — M797 Fibromyalgia: Secondary | ICD-10-CM | POA: Diagnosis present

## 2021-01-06 DIAGNOSIS — Z7982 Long term (current) use of aspirin: Secondary | ICD-10-CM | POA: Diagnosis not present

## 2021-01-06 DIAGNOSIS — Z7984 Long term (current) use of oral hypoglycemic drugs: Secondary | ICD-10-CM | POA: Diagnosis not present

## 2021-01-06 DIAGNOSIS — I10 Essential (primary) hypertension: Secondary | ICD-10-CM | POA: Diagnosis present

## 2021-01-06 DIAGNOSIS — N810 Urethrocele: Secondary | ICD-10-CM | POA: Diagnosis present

## 2021-01-06 DIAGNOSIS — K5900 Constipation, unspecified: Secondary | ICD-10-CM | POA: Diagnosis present

## 2021-01-06 DIAGNOSIS — G894 Chronic pain syndrome: Secondary | ICD-10-CM | POA: Diagnosis present

## 2021-01-06 DIAGNOSIS — E782 Mixed hyperlipidemia: Secondary | ICD-10-CM | POA: Diagnosis present

## 2021-01-06 DIAGNOSIS — G4733 Obstructive sleep apnea (adult) (pediatric): Secondary | ICD-10-CM | POA: Diagnosis present

## 2021-01-06 DIAGNOSIS — E1169 Type 2 diabetes mellitus with other specified complication: Secondary | ICD-10-CM | POA: Diagnosis present

## 2021-01-06 LAB — ECHOCARDIOGRAM COMPLETE
AR max vel: 2.18 cm2
AV Area VTI: 1.6 cm2
AV Area mean vel: 1.86 cm2
AV Mean grad: 3 mmHg
AV Peak grad: 5 mmHg
Ao pk vel: 1.12 m/s
Area-P 1/2: 4.24 cm2
Height: 63 in
MV VTI: 1.74 cm2
S' Lateral: 3.39 cm
Weight: 2440.93 oz

## 2021-01-06 LAB — COMPREHENSIVE METABOLIC PANEL
ALT: 11 U/L (ref 0–44)
AST: 27 U/L (ref 15–41)
Albumin: 3.8 g/dL (ref 3.5–5.0)
Alkaline Phosphatase: 43 U/L (ref 38–126)
Anion gap: 9 (ref 5–15)
BUN: 21 mg/dL (ref 8–23)
CO2: 26 mmol/L (ref 22–32)
Calcium: 9 mg/dL (ref 8.9–10.3)
Chloride: 101 mmol/L (ref 98–111)
Creatinine, Ser: 1.06 mg/dL — ABNORMAL HIGH (ref 0.44–1.00)
GFR, Estimated: 51 mL/min — ABNORMAL LOW (ref >60)
Glucose, Bld: 112 mg/dL — ABNORMAL HIGH (ref 70–99)
Potassium: 3.6 mmol/L (ref 3.5–5.1)
Sodium: 136 mmol/L (ref 135–145)
Total Bilirubin: 0.6 mg/dL (ref 0.3–1.2)
Total Protein: 6.7 g/dL (ref 6.5–8.1)

## 2021-01-06 LAB — CBG MONITORING, ED: Glucose-Capillary: 105 mg/dL — ABNORMAL HIGH (ref 70–99)

## 2021-01-06 LAB — HEMOGLOBIN A1C
Hgb A1c MFr Bld: 6.6 % — ABNORMAL HIGH (ref 4.8–5.6)
Mean Plasma Glucose: 143 mg/dL

## 2021-01-06 LAB — CBC
HCT: 34.2 % — ABNORMAL LOW (ref 36.0–46.0)
Hemoglobin: 11.2 g/dL — ABNORMAL LOW (ref 12.0–15.0)
MCH: 25.9 pg — ABNORMAL LOW (ref 26.0–34.0)
MCHC: 32.7 g/dL (ref 30.0–36.0)
MCV: 79 fL — ABNORMAL LOW (ref 80.0–100.0)
Platelets: 353 10*3/uL (ref 150–400)
RBC: 4.33 MIL/uL (ref 3.87–5.11)
RDW: 18.4 % — ABNORMAL HIGH (ref 11.5–15.5)
WBC: 13.2 10*3/uL — ABNORMAL HIGH (ref 4.0–10.5)
nRBC: 0 % (ref 0.0–0.2)

## 2021-01-06 LAB — GLUCOSE, CAPILLARY
Glucose-Capillary: 112 mg/dL — ABNORMAL HIGH (ref 70–99)
Glucose-Capillary: 110 mg/dL — ABNORMAL HIGH (ref 70–99)
Glucose-Capillary: 128 mg/dL — ABNORMAL HIGH (ref 70–99)
Glucose-Capillary: 95 mg/dL (ref 70–99)

## 2021-01-06 LAB — MAGNESIUM: Magnesium: 2.4 mg/dL (ref 1.7–2.4)

## 2021-01-06 MED ORDER — MAGNESIUM OXIDE -MG SUPPLEMENT 400 (240 MG) MG PO TABS
200.0000 mg | ORAL_TABLET | Freq: Every day | ORAL | Status: DC
  Administered 2021-01-06 – 2021-01-12 (×7): 200 mg via ORAL
  Filled 2021-01-06 (×7): qty 1

## 2021-01-06 MED ORDER — VITAMIN B-12 1000 MCG PO TABS
5000.0000 ug | ORAL_TABLET | Freq: Every day | ORAL | Status: DC
  Administered 2021-01-06 – 2021-01-12 (×7): 5000 ug via ORAL
  Filled 2021-01-06 (×7): qty 5

## 2021-01-06 MED ORDER — VANCOMYCIN HCL 750 MG/150ML IV SOLN
750.0000 mg | INTRAVENOUS | Status: DC
  Administered 2021-01-07: 750 mg via INTRAVENOUS
  Filled 2021-01-06 (×2): qty 150

## 2021-01-06 MED ORDER — PANTOPRAZOLE SODIUM 40 MG PO TBEC
40.0000 mg | DELAYED_RELEASE_TABLET | Freq: Every day | ORAL | Status: DC
  Administered 2021-01-06 – 2021-01-12 (×7): 40 mg via ORAL
  Filled 2021-01-06 (×7): qty 1

## 2021-01-06 MED ORDER — SODIUM CHLORIDE 0.9 % IV SOLN
2.0000 g | Freq: Two times a day (BID) | INTRAVENOUS | Status: DC
  Administered 2021-01-06 – 2021-01-07 (×3): 2 g via INTRAVENOUS
  Filled 2021-01-06 (×5): qty 2

## 2021-01-06 MED ORDER — QUETIAPINE FUMARATE 25 MG PO TABS
25.0000 mg | ORAL_TABLET | Freq: Two times a day (BID) | ORAL | Status: DC
  Administered 2021-01-06 – 2021-01-12 (×13): 25 mg via ORAL
  Filled 2021-01-06 (×13): qty 1

## 2021-01-06 MED ORDER — ACETAMINOPHEN 325 MG PO TABS
650.0000 mg | ORAL_TABLET | Freq: Four times a day (QID) | ORAL | Status: DC | PRN
  Administered 2021-01-06 – 2021-01-12 (×11): 650 mg via ORAL
  Filled 2021-01-06 (×11): qty 2

## 2021-01-06 MED ORDER — ENOXAPARIN SODIUM 40 MG/0.4ML IJ SOSY
40.0000 mg | PREFILLED_SYRINGE | INTRAMUSCULAR | Status: DC
  Administered 2021-01-06 – 2021-01-12 (×7): 40 mg via SUBCUTANEOUS
  Filled 2021-01-06 (×7): qty 0.4

## 2021-01-06 MED ORDER — VANCOMYCIN HCL 750 MG/150ML IV SOLN
750.0000 mg | Freq: Once | INTRAVENOUS | Status: AC
  Administered 2021-01-06: 750 mg via INTRAVENOUS
  Filled 2021-01-06: qty 150

## 2021-01-06 MED ORDER — SALINE SPRAY 0.65 % NA SOLN
1.0000 | NASAL | Status: DC | PRN
  Administered 2021-01-06 – 2021-01-10 (×2): 1 via NASAL
  Filled 2021-01-06 (×2): qty 44

## 2021-01-06 MED ORDER — PRAVASTATIN SODIUM 20 MG PO TABS
40.0000 mg | ORAL_TABLET | Freq: Every day | ORAL | Status: DC
  Administered 2021-01-06 – 2021-01-11 (×6): 40 mg via ORAL
  Filled 2021-01-06 (×6): qty 2

## 2021-01-06 MED ORDER — INSULIN ASPART 100 UNIT/ML IJ SOLN
0.0000 [IU] | Freq: Three times a day (TID) | INTRAMUSCULAR | Status: DC
  Administered 2021-01-06 – 2021-01-07 (×2): 3 [IU] via SUBCUTANEOUS
  Administered 2021-01-07: 10:00:00 4 [IU] via SUBCUTANEOUS
  Administered 2021-01-08: 08:00:00 3 [IU] via SUBCUTANEOUS
  Administered 2021-01-08 – 2021-01-09 (×2): 4 [IU] via SUBCUTANEOUS
  Administered 2021-01-09 – 2021-01-10 (×2): 7 [IU] via SUBCUTANEOUS
  Administered 2021-01-11: 4 [IU] via SUBCUTANEOUS
  Administered 2021-01-11: 08:00:00 3 [IU] via SUBCUTANEOUS
  Administered 2021-01-12: 4 [IU] via SUBCUTANEOUS
  Administered 2021-01-12: 09:00:00 3 [IU] via SUBCUTANEOUS
  Filled 2021-01-06 (×12): qty 1

## 2021-01-06 MED ORDER — POTASSIUM CHLORIDE IN NACL 20-0.9 MEQ/L-% IV SOLN
INTRAVENOUS | Status: DC
  Filled 2021-01-06 (×8): qty 1000

## 2021-01-06 MED ORDER — INSULIN ASPART 100 UNIT/ML IJ SOLN: 0.0000 [IU] | Freq: Every day | INTRAMUSCULAR | Status: DC

## 2021-01-06 MED ORDER — ONDANSETRON HCL 4 MG PO TABS: 4.0000 mg | ORAL_TABLET | Freq: Four times a day (QID) | ORAL | Status: DC | PRN

## 2021-01-06 MED ORDER — DONEPEZIL HCL 5 MG PO TABS
10.0000 mg | ORAL_TABLET | Freq: Every day | ORAL | Status: DC
  Administered 2021-01-06 – 2021-01-12 (×7): 10 mg via ORAL
  Filled 2021-01-06 (×8): qty 2

## 2021-01-06 MED ORDER — ONDANSETRON HCL 4 MG/2ML IJ SOLN: 4.0000 mg | Freq: Four times a day (QID) | INTRAMUSCULAR | Status: DC | PRN

## 2021-01-06 MED ORDER — VITAMIN D 25 MCG (1000 UNIT) PO TABS
1000.0000 [IU] | ORAL_TABLET | ORAL | Status: DC
  Administered 2021-01-06: 1000 [IU] via ORAL
  Filled 2021-01-06 (×3): qty 1

## 2021-01-06 MED ORDER — METOPROLOL TARTRATE 25 MG PO TABS
12.5000 mg | ORAL_TABLET | Freq: Two times a day (BID) | ORAL | Status: DC
  Administered 2021-01-06 – 2021-01-07 (×3): 12.5 mg via ORAL
  Filled 2021-01-06 (×3): qty 1

## 2021-01-06 MED ORDER — SODIUM CHLORIDE 0.9% FLUSH
3.0000 mL | Freq: Two times a day (BID) | INTRAVENOUS | Status: DC
  Administered 2021-01-06 – 2021-01-10 (×7): 3 mL via INTRAVENOUS

## 2021-01-06 MED ORDER — DULOXETINE HCL 30 MG PO CPEP
60.0000 mg | ORAL_CAPSULE | Freq: Every day | ORAL | Status: DC
  Administered 2021-01-06 – 2021-01-12 (×7): 60 mg via ORAL
  Filled 2021-01-06 (×7): qty 2

## 2021-01-06 MED ORDER — MIRTAZAPINE 15 MG PO TABS
15.0000 mg | ORAL_TABLET | Freq: Every day | ORAL | Status: DC
  Administered 2021-01-06 – 2021-01-11 (×6): 15 mg via ORAL
  Filled 2021-01-06 (×6): qty 1

## 2021-01-06 MED ORDER — ASPIRIN 81 MG PO CHEW
81.0000 mg | CHEWABLE_TABLET | Freq: Every day | ORAL | Status: DC
  Administered 2021-01-06 – 2021-01-12 (×7): 81 mg via ORAL
  Filled 2021-01-06 (×7): qty 1

## 2021-01-06 MED ORDER — VANCOMYCIN HCL 1250 MG/250ML IV SOLN: 1250.0000 mg | INTRAVENOUS | Status: DC

## 2021-01-06 NOTE — H&P (Signed)
History and Physical   Loretta ChessmanRichie S Denise ZOX:[id number] DOB: 05/11/1931 DOA: 01/05/2021  Referring MD/NP/PA: Dr Derrill KayGoodman  PCP: Danella PentonMiller, Mark F, MD   Patient coming from: Home  Chief Complaint: Syncope  HPI: Loretta Webb is a 87 y.o. female with medical history significant of atrial fibrillation, chronic pain syndrome, osteoarthritis, diabetes type 2, GERD, fibromyalgia, history of esophageal stricture, essential hypertension and obstructive sleep apnea who was brought in from home after a witnessed syncopal episode this morning.  She had physical therapy at the time.  Did not come to the ER.  In the afternoon she had another 1.  Patient was evaluated by EMS where she was found to be in A. fib with RVR.  Rate it was in the 180s at the time.  She received 10 L of metoprolol.  Patient was noted to have oxygen sats of 92% on room air and was placed on 2 L of oxygen.  Brought into the ER where she was found to be hypotensive.  Patient is systolic blood pressure was in the 90 and diastolic 57.  Her heart rate was 110 on arrival.  She did have some bruises on lower and upper extremities some skin abrasions.  She is slightly confused but has baseline dementia although mild.  She was noted to also have some hypothermia, A. fib with RVR as well as leukocytosis.  Patient has lactic acidosis with suspicion for possible sepsis but source is unknown.  Empirically started on vancomycin and cefepime in the ER and being admitted to the hospital for evaluation of syncope..  ED Course: Temperature is 97.4 blood pressure is 78/50, pulse 122 respiratory of 26 oxygen sat 93% on room air.  White count 11.7 hemoglobin 10.8 with platelet 344.  Sodium 136 potassium 3.4 chloride 100 CO2 24.  BUN is 23 creatinine 1.23 and calcium 8.8.  Lactic acid 3.7.  INR 1.1.  COVID-19 and influenza negative.  Urinalysis essentially negative.  Head CT without contrast and CT cervical spine were negative.  Chest x-ray showed no acute findings.   Patient being admitted with syncopal episode most likely secondary to dehydration, A. fib with RVR or other cardiac causes.  Review of Systems: As per HPI otherwise 10 point review of systems negative.    Past Medical History:  Diagnosis Date   Arthritis    Chronic fatigue    DM2 (diabetes mellitus, type 2) (HCC)    Esophageal stricture    Fibromyalgia    GERD (gastroesophageal reflux disease)    Hypertension    Lumbar stenosis    Osteoporosis    Sleep apnea     Past Surgical History:  Procedure Laterality Date   ABDOMINAL HYSTERECTOMY     APPENDECTOMY     knee replacements     ROTATOR CUFF REPAIR       reports that she has never smoked. She has never used smokeless tobacco. She reports that she does not drink alcohol and does not use drugs.  No Known Allergies  Family History  Problem Relation Age of Onset   Diabetes Father    Cancer Father    Cerebral aneurysm Father    Cancer Mother    Cancer Sister    Depression Sister      Prior to Admission medications   Medication Sig Start Date End Date Taking? Authorizing Provider  acetaminophen (TYLENOL) 500 MG tablet Take 500 mg by mouth 2 (two) times daily as needed.    [provider]  aspirin 81  MG chewable tablet Chew by mouth daily.    [provider]  bumetanide (BUMEX) 1 MG tablet Take 1 mg by mouth daily.  04/24/14   [provider]  Cholecalciferol 25 MCG (1000 UT) tablet Take 1,000 Units by mouth once a week.    [provider]  Cyanocobalamin (VITAMIN B-12) 5000 MCG TBDP Take 5,000 mcg by mouth daily.    [provider]  diclofenac Sodium (VOLTAREN) 1 % GEL Apply topically 4 (four) times daily.    [provider]  donepezil (ARICEPT) 10 MG tablet Take 10 mg by mouth daily. 10/27/20   [provider]  DULoxetine (CYMBALTA) 60 MG capsule Take 60 mg by mouth daily.  04/24/14   [provider]  Magnesium 250 MG TABS Take 250 mg by mouth daily.     [provider]  metFORMIN (GLUCOPHAGE) 1000 MG tablet Take 1,000 mg by mouth 2 (two) times daily with a meal.    [provider]  mirtazapine (REMERON) 15 MG tablet Take by mouth. 09/24/20   [provider]  pantoprazole (PROTONIX) 20 MG tablet Take 20 mg by mouth daily.    [provider]  pantoprazole (PROTONIX) 40 MG tablet Take 40 mg by mouth daily. 11/18/20   [provider]  pravastatin (PRAVACHOL) 40 MG tablet Take 40 mg by mouth at bedtime.  10/13/14 09/29/20  [provider]  QUEtiapine (SEROQUEL) 25 MG tablet Take 25 mg by mouth 2 (two) times daily.    [provider]    Physical Exam: Vitals:   01/05/21 2238 01/05/21 2300 01/05/21 2330 01/06/21 0000  BP: 124/78 126/76 110/72 119/89  Pulse: (!) 121 (!) 122 (!) 107 (!) 103  Resp: (!) 21 16 (!) 25 (!) 23  Temp:      TempSrc:      SpO2: 100% 95% 94% 100%  Weight:      Height:          Constitutional: Acute on chronically ill looking no distress Vitals:   01/05/21 2238 01/05/21 2300 01/05/21 2330 01/06/21 0000  BP: 124/78 126/76 110/72 119/89  Pulse: (!) 121 (!) 122 (!) 107 (!) 103  Resp: (!) 21 16 (!) 25 (!) 23  Temp:      TempSrc:      SpO2: 100% 95% 94% 100%  Weight:      Height:       Eyes: PERRL, lids and conjunctivae normal ENMT: Mucous membranes are dry posterior pharynx clear of any exudate or lesions.Normal dentition.  Neck: normal, supple, no masses, no thyromegaly Respiratory: clear to auscultation bilaterally, no wheezing, no crackles. Normal respiratory effort. No accessory muscle use.  Cardiovascular: Irregularly irregular with tachycardia, no murmurs / rubs / gallops. No extremity edema. 2+ pedal pulses. No carotid bruits.  Abdomen: no tenderness, no masses palpated. No hepatosplenomegaly. Bowel sounds positive.  Musculoskeletal: no clubbing / cyanosis. No joint deformity upper and lower extremities. Good ROM, no contractures. Normal muscle  tone.  Skin: no rashes, lesions, ulcers. No induration Neurologic: CN 2-12 grossly intact. Sensation intact, DTR normal. Strength 5/5 in all 4.  Psychiatric: Confused, hard of hearing, disoriented.     Labs on Admission: I have personally reviewed following labs and imaging studies  CBC: Recent Labs  Lab 01/05/21 1947  WBC 11.7*  NEUTROABS 9.0*  HGB 10.8*  HCT 33.8*  MCV 79.7*  PLT 344   Basic Metabolic Panel: Recent Labs  Lab 01/05/21 1947  NA 136  K 3.4*  CL 100  CO2 24  GLUCOSE 216*  BUN 23  CREATININE 1.23*  CALCIUM 8.8*   GFR: Estimated Creatinine Clearance: 30.6 mL/min (A) (by C-G formula based on SCr of 1.23 mg/dL (H)). Liver Function Tests: Recent Labs  Lab 01/05/21 1947  AST 29  ALT 12  ALKPHOS 41  BILITOT 0.6  PROT 6.2*  ALBUMIN 3.6   No results for input(s): LIPASE, AMYLASE in the last 168 hours. No results for input(s): AMMONIA in the last 168 hours. Coagulation Profile: Recent Labs  Lab 01/05/21 1947  INR 1.1   Cardiac Enzymes: No results for input(s): CKTOTAL, CKMB, CKMBINDEX, TROPONINI in the last 168 hours. BNP (last 3 results) No results for input(s): PROBNP in the last 8760 hours. HbA1C: No results for input(s): HGBA1C in the last 72 hours. CBG: No results for input(s): GLUCAP in the last 168 hours. Lipid Profile: No results for input(s): CHOL, HDL, LDLCALC, TRIG, CHOLHDL, LDLDIRECT in the last 72 hours. Thyroid Function Tests: No results for input(s): TSH, T4TOTAL, FREET4, T3FREE, THYROIDAB in the last 72 hours. Anemia Panel: No results for input(s): VITAMINB12, FOLATE, FERRITIN, TIBC, IRON, RETICCTPCT in the last 72 hours. Urine analysis:    Component Value Date/Time   COLORURINE YELLOW (A) 01/05/2021 2030   APPEARANCEUR CLEAR (A) 01/05/2021 2030   LABSPEC 1.016 01/05/2021 2030   PHURINE 7.0 01/05/2021 2030   GLUCOSEU NEGATIVE 01/05/2021 2030   HGBUR NEGATIVE 01/05/2021 2030   BILIRUBINUR NEGATIVE 01/05/2021 2030    KETONESUR NEGATIVE 01/05/2021 2030   PROTEINUR NEGATIVE 01/05/2021 2030   NITRITE NEGATIVE 01/05/2021 2030   LEUKOCYTESUR TRACE (A) 01/05/2021 2030   Sepsis Labs: (procalcitonin:4,lacticidven:4) ) Recent Results (from the past 240 hour(s))  Resp Panel by RT-PCR (Flu A&B, Covid) Nasopharyngeal Swab     Status: None   Collection Time: 01/05/21  7:47 PM   Specimen: Nasopharyngeal Swab; Nasopharyngeal(NP) swabs in vial transport medium  Result Value Ref Range Status   SARS Coronavirus 2 by RT PCR NEGATIVE NEGATIVE Final    Comment: (NOTE) SARS-CoV-2 target nucleic acids are NOT DETECTED.  The SARS-CoV-2 RNA is generally detectable in upper respiratory specimens during the acute phase of infection. The lowest concentration of SARS-CoV-2 viral copies this assay can detect is 138 copies/mL. A negative result does not preclude SARS-Cov-2 infection and should not be used as the sole basis for treatment or other patient management decisions. A negative result may occur with  improper specimen collection/handling, submission of specimen other than nasopharyngeal swab, presence of viral mutation(s) within the areas targeted by this assay, and inadequate number of viral copies(<138 copies/mL). A negative result must be combined with clinical observations, patient history, and epidemiological information. The expected result is Negative.  Fact Sheet for Patients:  BloggerCourse.com  Fact Sheet for Healthcare Providers:  SeriousBroker.it  This test is no t yet approved or cleared by the [place] FDA and  has been authorized for detection and/or diagnosis of SARS-CoV-2 by FDA under an Emergency Use Authorization (EUA). This EUA will remain  in effect (meaning this test can be used) for the duration of the COVID-19 declaration under Section 564(b)(1) of the Act, 21 U.S.C.section 360bbb-3(b)(1), unless the authorization is  terminated  or revoked sooner.       Influenza A by PCR NEGATIVE NEGATIVE Final   Influenza B by PCR NEGATIVE NEGATIVE Final    Comment: (NOTE) The Xpert Xpress SARS-CoV-2/FLU/RSV plus assay is intended as an aid in the diagnosis of influenza from Nasopharyngeal swab specimens  and should not be used as a sole basis for treatment. Nasal washings and aspirates are unacceptable for Xpert Xpress SARS-CoV-2/FLU/RSV testing.  Fact Sheet for Patients: BloggerCourse.com  Fact Sheet for Healthcare Providers: SeriousBroker.it  This test is not yet approved or cleared by the [place] FDA and has been authorized for detection and/or diagnosis of SARS-CoV-2 by FDA under an Emergency Use Authorization (EUA). This EUA will remain in effect (meaning this test can be used) for the duration of the COVID-19 declaration under Section 564(b)(1) of the Act, 21 U.S.C. section 360bbb-3(b)(1), unless the authorization is terminated or revoked.  Performed at [facility], [address], [place], [place] [address]      Radiological Exams on Admission: CT HEAD WO CONTRAST ( )  Result Date: 01/05/2021 CLINICAL DATA:  Syncope and subsequent fall. EXAM: CT HEAD WITHOUT CONTRAST TECHNIQUE: Contiguous axial images were obtained from the base of the skull through the vertex without intravenous contrast. COMPARISON:  May 05, 2018 FINDINGS: Brain: There is mild cerebral atrophy with widening of the extra-axial spaces and ventricular dilatation. There are areas of decreased attenuation within the white matter tracts of the supratentorial brain, consistent with microvascular disease changes. Vascular: No hyperdense vessel or unexpected calcification. Skull: Hyperostosis frontalis interna is again seen. Negative for fracture or focal lesion. Sinuses/Orbits: No acute finding. Other: None. IMPRESSION: Generalized cerebral atrophy without an acute  intracranial abnormality. Electronically Signed   By: Aram Candela M.D.   On: 01/05/2021 20:59   CT Cervical Spine Wo Contrast  Result Date: 01/05/2021 CLINICAL DATA:  Syncope and subsequent fall. EXAM: CT CERVICAL SPINE WITHOUT CONTRAST TECHNIQUE: Multidetector CT imaging of the cervical spine was performed without intravenous contrast. Multiplanar CT image reconstructions were also generated. COMPARISON:  None. FINDINGS: Alignment: Approximally 1 mm to 2 mm retrolisthesis of the C5 vertebral body is noted on C6. Skull base and vertebrae: No acute fracture. Chronic and degenerative changes are seen involving the body and tip of the dens. No primary bone lesion or focal pathologic process. Soft tissues and spinal canal: No prevertebral fluid or swelling. No visible canal hematoma. Disc levels: Marked severity endplate sclerosis is seen at the level of C5-C6 with moderate severity anterior osteophyte formation noted at the levels of C4-C5 and C5-C6. There is moderate severity narrowing of the anterior atlantoaxial articulation. Marked severity intervertebral disc space narrowing is seen at the level of C5-C6. Mild intervertebral disc space narrowing is present throughout the remainder of the cervical spine. Bilateral marked severity multilevel facet joint hypertrophy is noted. Upper chest: Negative. Other: None. IMPRESSION: 1. Marked severity degenerative changes, most prominent at the level of C5-C6. 2. No acute cervical spine fracture. Electronically Signed   By: Aram Candela M.D.   On: 01/05/2021 21:03   DG Chest Port 1 View  Result Date: 01/05/2021 CLINICAL DATA:  Syncope, fall EXAM: PORTABLE CHEST 1 VIEW COMPARISON:  11/25/2016 FINDINGS: Insert lung No pleural effusion or pneumothorax. The heart is top-normal in size. Degenerative changes of the right shoulder. IMPRESSION: No evidence of acute cardiopulmonary disease. Electronically Signed   By: Charline Bills M.D.   On: 01/05/2021 20:29     EKG: Independently reviewed.  A. fib with RVR with a rate of 120s.  No other significant findings  Assessment/Plan Principal Problem:   Syncope and collapse Active Problems:   Benign essential HTN   OSA (obstructive sleep apnea)   DM type 2 with diabetic mixed hyperlipidemia (HCC)   Diabetic peripheral neuropathy (HCC)  Atrial fibrillation with rapid ventricular response (HCC)   Dehydration   Hypotension   Hypokalemia   Lactic acidosis     #1 syncope: Most likely secondary to cardiac causes.  Patient does have a history of A. fib.  She has A. fib with RVR here.  Patient however is also dehydrated with hypotension on arrival which could mean orthostasis leading to syncope.  Which we will admit the patient.  Aggressive hydration.  Due to low blood pressure we will avoid Cardizem or more beta-blocker for now.  After aggressive hydration and normalization of blood pressure we will see if heart rate will be normalized.  If not we will probably use amiodarone or digoxin or low-dose beta-blockers.  Get echocardiogram.  #2 A. fib with RVR: As per above.  Not on anticoagulation.  Not a candidate with her frequent falls.  #3 hypotension: Probably dehydration.  Continue aggressive hydration and monitor.  #4 lactic acidosis: Suspected sepsis versus dehydration as a cause.  Hydrate and follow lactic acid level.  #5 suspected sepsis: Patient meets sepsis criteria but no obvious source.  Empiric vancomycin and cefepime.  Blood cultures obtained.  If no evidence of infection may DC that in the morning.  #6 hypokalemia: Replete potassium.  #7 diabetes type 2: Hold metformin and use sliding scale insulin.  #8 obstructive sleep apnea: We will offer CPAP at night.   DVT prophylaxis: Lovenox Code Status: Full code Family Communication: No family at bedside Disposition Plan: Home Consults called: None Admission status: Observation  Severity of Illness: The appropriate patient status for  this patient is OBSERVATION. Observation status is judged to be reasonable and necessary in order to provide the required intensity of service to ensure the patient's safety. The patient's presenting symptoms, physical exam findings, and initial radiographic and laboratory data in the context of their medical condition is felt to place them at decreased risk for further clinical deterioration. Furthermore, it is anticipated that the patient will be medically stable for discharge from the hospital within 2 midnights of admission. The following factors support the patient status of observation.   " The patient's presenting symptoms include syncope. " The physical exam findings include evidence of dehydration and hypotension. " The initial radiographic and laboratory data are lactic acidosis.   Lonia Blood MD [facility] Pager [phone]  If 7PM-7AM, please contact night-coverage [email or web address] Password [facility]  01/06/2021, 12:15 AM

## 2021-01-06 NOTE — Progress Notes (Signed)
  Patient ID: Loretta Webb, female   DOB: [birth year], 87 y.o.   MRN: [id number] This is a no charge note as patient was admitted this AM.  H&P reviewed.  Family at bedside.Loretta Webb is a 87 y.o. female with medical history significant of atrial fibrillation, chronic pain syndrome, osteoarthritis, diabetes type 2, GERD, fibromyalgia, history of esophageal stricture, essential hypertension and obstructive sleep apnea who was brought in from home after syncopal episode.  Found with A. fib RVR.. Currently HR 110's to 130's. Denies sob, cp, dizziness  A/p Consulted cardiology Per daughter this am she has had multiple falls, likely not a candidate for a/c.

## 2021-01-06 NOTE — ED Notes (Signed)
Patient transported to room 113 via stretcher by EDT, stable at transfer.

## 2021-01-06 NOTE — Plan of Care (Signed)
  Problem: Education: Goal: Knowledge of condition and prescribed therapy will improve Outcome: Progressing   Problem: Cardiac: Goal: Will achieve and/or maintain adequate cardiac output Outcome: Progressing   Problem: Physical Regulation: Goal: Complications related to the disease process, condition or treatment will be avoided or minimized Outcome: Progressing   Problem: Education: Goal: Knowledge of General Education information will improve Description: Including pain rating scale, medication(s)/side effects and non-pharmacologic comfort measures Outcome: Progressing   Problem: Clinical Measurements: Goal: Ability to maintain clinical measurements within normal limits will improve Outcome: Progressing Goal: Will remain free from infection Outcome: Progressing Goal: Diagnostic test results will improve Outcome: Progressing Goal: Respiratory complications will improve Outcome: Progressing Goal: Cardiovascular complication will be avoided Outcome: Progressing   Problem: Nutrition: Goal: Adequate nutrition will be maintained Outcome: Progressing   Problem: Coping: Goal: Level of anxiety will decrease Outcome: Progressing   Problem: Elimination: Goal: Will not experience complications related to bowel motility Outcome: Progressing Goal: Will not experience complications related to urinary retention Outcome: Progressing   Problem: Pain Managment: Goal: General experience of comfort will improve Outcome: Progressing   Problem: Safety: Goal: Ability to remain free from injury will improve Outcome: Progressing   Problem: Skin Integrity: Goal: Risk for impaired skin integrity will decrease Outcome: Progressing

## 2021-01-06 NOTE — Progress Notes (Signed)
*  PRELIMINARY RESULTS* Echocardiogram 2D Echocardiogram has been performed.  Loretta Webb 01/06/2021, 11:45 AM

## 2021-01-06 NOTE — Consult Note (Signed)
Cardiology Consultation:   Patient ID: Loretta Webb MRN: [id number]; DOB: 1931/10/24  Admit date: 01/05/2021 Date of Consult: 01/06/2021  PCP:  Rusty Aus, MD   [facility] Providers Cardiologist:  [facility] rounding   Patient Profile:   Loretta Webb is a 87 y.o. female with a hx of hypertension, diabetes, atrial fibrillation who is being seen 01/06/2021 for the evaluation of atrial fibrillation at the request of Dr. Kurtis Bushman.  History of Present Illness:   Loretta Webb is an 87 year old female with history of diabetes, hypertension, atrial fibrillation, hard of hearing presenting with due to falls.  Patient states falling twice yesterday while at home.  Initially, she was getting up walking to her kitchen and suddenly fell.  Later on while walking, she said her feet could not move causing her to fall.  She has a history of falls, supposed to use a walker but endorses not using her walker as instructed.  Denies palpitations, chest pain or shortness of breath.  EMS was called, patient noted to be in atrial fibrillation.  Was brought to the hospital per EMS.  In the ED, initial blood pressures were low.  She met criteria for sepsis, started on IV fluids and antibiotics.  EKG on admission not yet uploaded, reported to be in atrial fibrillation.  She otherwise states feeling okay, daughter at bedside.   Past Medical History:  Diagnosis Date   Arthritis    Chronic fatigue    DM2 (diabetes mellitus, type 2) (HCC)    Esophageal stricture    Fibromyalgia    GERD (gastroesophageal reflux disease)    Hypertension    Lumbar stenosis    Osteoporosis    Sleep apnea     Past Surgical History:  Procedure Laterality Date   ABDOMINAL HYSTERECTOMY     APPENDECTOMY     knee replacements     ROTATOR CUFF REPAIR       Home Medications:  Prior to Admission medications   Medication Sig Start Date End Date Taking? Authorizing Provider  acetaminophen (TYLENOL) 500 MG tablet  Take 500 mg by mouth 2 (two) times daily as needed.   Yes [provider]  aspirin 81 MG chewable tablet Chew by mouth daily.   Yes [provider]  bumetanide (BUMEX) 1 MG tablet Take 1 mg by mouth daily.  04/24/14  Yes [provider]  Cholecalciferol 25 MCG (1000 UT) tablet Take 1,000 Units by mouth once a week.   Yes [provider]  Cyanocobalamin (VITAMIN B-12) 5000 MCG TBDP Take 5,000 mcg by mouth daily.   Yes [provider]  diclofenac Sodium (VOLTAREN) 1 % GEL Apply topically 4 (four) times daily.   Yes [provider]  donepezil (ARICEPT) 10 MG tablet Take 10 mg by mouth daily. 10/27/20  Yes [provider]  DULoxetine (CYMBALTA) 60 MG capsule Take 60 mg by mouth daily.  04/24/14  Yes [provider]  Magnesium 250 MG TABS Take 250 mg by mouth daily.   Yes [provider]  metFORMIN (GLUCOPHAGE) 1000 MG tablet Take 1,000 mg by mouth 2 (two) times daily with a meal.   Yes [provider]  mirtazapine (REMERON) 15 MG tablet Take 15 mg by mouth at bedtime. 09/24/20  Yes [provider]  pantoprazole (PROTONIX) 40 MG tablet Take 40 mg by mouth daily. 11/18/20  Yes [provider]  QUEtiapine (SEROQUEL) 25 MG tablet Take 25 mg by mouth 2 (two) times daily.  Yes [provider]  pravastatin (PRAVACHOL) 40 MG tablet Take 40 mg by mouth at bedtime.  10/13/14 09/29/20  [provider]    Inpatient Medications: Scheduled Meds:  aspirin  81 mg Oral Daily   cholecalciferol  1,000 Units Oral Weekly   donepezil  10 mg Oral Daily   DULoxetine  60 mg Oral Daily   enoxaparin (LOVENOX) injection  40 mg Subcutaneous Q24H   insulin aspart  0-20 Units Subcutaneous TID WC   insulin aspart  0-5 Units Subcutaneous QHS   magnesium oxide  200 mg Oral Daily   metoprolol tartrate  12.5 mg Oral BID   mirtazapine  15 mg Oral QHS   pantoprazole  40 mg Oral Daily   pravastatin  40 mg Oral  QHS   QUEtiapine  25 mg Oral BID   sodium chloride flush  3 mL Intravenous Q12H   vitamin B-12  5,000 mcg Oral Daily   Continuous Infusions:  0.9 % NaCl with KCl 20 mEq / L 125 mL/hr at 01/06/21 2637   ceFEPime (MAXIPIME) IV 2 g (01/06/21 1101)   lactated ringers Stopped (01/06/21 0118)   [START ON 01/07/2021] vancomycin     PRN Meds: acetaminophen, ondansetron **OR** ondansetron (ZOFRAN) IV, sodium chloride  Allergies:   No Known Allergies  Social History:   Social History   Socioeconomic History   Marital status: Single    Spouse name: Not on file   Number of children: Not on file   Years of education: Not on file   Highest education level: Not on file  Occupational History   Not on file  Tobacco Use   Smoking status: Never   Smokeless tobacco: Never  Vaping Use   Vaping Use: Every day  Substance and Sexual Activity   Alcohol use: No    Alcohol/week: 0.0 standard drinks   Drug use: No   Sexual activity: Not on file  Other Topics Concern   Not on file  Social History Narrative   Not on file   Social Determinants of Health   Financial Resource Strain: Not on file  Food Insecurity: Not on file  Transportation Needs: Not on file  Physical Activity: Not on file  Stress: Not on file  Social Connections: Not on file  Intimate Partner Violence: Not on file    Family History:    Family History  Problem Relation Age of Onset   Diabetes Father    Cancer Father    Cerebral aneurysm Father    Cancer Mother    Cancer Sister    Depression Sister      ROS:  Please see the history of present illness.   All other ROS reviewed and negative.     Physical Exam/Data:   Vitals:   01/06/21 0340 01/06/21 0401 01/06/21 0836 01/06/21 1143  BP: (!) 148/86  (!) 149/89 (!) 123/93  Pulse: 68  (!) 110 (!) 107  Resp: 16  18   Temp: 98.3 F (36.8 C)  97.8 F (36.6 C)   TempSrc: Oral  Oral   SpO2: 97%  98%   Weight:  69.2 kg    Height:        Intake/Output Summary  (Last 24 hours) at 01/06/2021 1252 Last data filed at 01/06/2021 1009 Gross per 24 hour  Intake 1854.26 ml  Output --  Net 1854.26 ml   Last 3 Weights 01/06/2021 01/05/2021 12/02/2020  Weight (lbs) 152 lb 8.9 oz 158 lb 11.7 oz 140 lb  Weight (kg) 69.2 kg 72 kg 63.504 kg     Body mass index is 27.02 kg/m.  General:  Well nourished, well developed, in no acute distress HEENT: Hard of hearing Lymph: no adenopathy Neck: no JVD Endocrine:  No thryomegaly Vascular: No carotid bruits; FA pulses 2+ bilaterally without bruits  Cardiac: Irregular irregular; faint systolic murmur Lungs:  clear to auscultation bilaterally, no wheezing, rhonchi or rales  Abd: soft, nontender, no hepatomegaly  Ext: no edema Musculoskeletal:  No deformities, BUE and BLE strength normal and equal Skin: warm and dry  Neuro: no focal abnormalities noted Psych:  Normal affect   EKG:  The EKG was personally reviewed and demonstrates: Not uploaded Telemetry:  Telemetry was personally reviewed and demonstrates: Atrial fibrillation heart rate 119  Relevant CV Studies: TTE 01/06/2021 1. Left ventricular ejection fraction, by estimation, is 50 to 55%. The  left ventricle has low normal function. The left ventricle has no regional  wall motion abnormalities. Left ventricular diastolic parameters are  indeterminate.   2. Right ventricular systolic function is normal. The right ventricular  size is normal.   3. Left atrial size was mildly dilated.   4. The mitral valve is normal in structure. Mild mitral valve  regurgitation.   5. The aortic valve is tricuspid. Aortic valve regurgitation is not  visualized. Mild aortic valve sclerosis is present, with no evidence of  aortic valve stenosis.   6. The inferior vena cava is normal in size with greater than 50%  respiratory variability, suggesting right atrial pressure of 3 mmHg.   Laboratory Data:  High Sensitivity Troponin:  No results for input(s): TROPONINIHS in the  last 720 hours.   Chemistry Recent Labs  Lab 01/05/21 1947 01/06/21 0100  NA 136 136  K 3.4* 3.6  CL 100 101  CO2 24 26  GLUCOSE 216* 112*  BUN 23 21  CREATININE 1.23* 1.06*  CALCIUM 8.8* 9.0  GFRNONAA 43* 51*  ANIONGAP 12 9    Recent Labs  Lab 01/05/21 1947 01/06/21 0100  PROT 6.2* 6.7  ALBUMIN 3.6 3.8  AST 29 27  ALT 12 11  ALKPHOS 41 43  BILITOT 0.6 0.6   Hematology Recent Labs  Lab 01/05/21 1947 01/06/21 0100  WBC 11.7* 13.2*  RBC 4.24 4.33  HGB 10.8* 11.2*  HCT 33.8* 34.2*  MCV 79.7* 79.0*  MCH 25.5* 25.9*  MCHC 32.0 32.7  RDW 18.6* 18.4*  PLT 344 353   BNPNo results for input(s): BNP, PROBNP in the last 168 hours.  DDimer No results for input(s): DDIMER in the last 168 hours.   Radiology/Studies:  CT HEAD WO CONTRAST (5MM)  Result Date: 01/05/2021 CLINICAL DATA:  Syncope and subsequent fall. EXAM: CT HEAD WITHOUT CONTRAST TECHNIQUE: Contiguous axial images were obtained from the base of the skull through the vertex without intravenous contrast. COMPARISON:  May 05, 2018 FINDINGS: Brain: There is mild cerebral atrophy with widening of the extra-axial spaces and ventricular dilatation. There are areas of decreased attenuation within the white matter tracts of the supratentorial brain, consistent with microvascular disease changes. Vascular: No hyperdense vessel or unexpected calcification. Skull: Hyperostosis frontalis interna is again seen. Negative for fracture or focal lesion. Sinuses/Orbits: No acute finding. Other: None. IMPRESSION: Generalized cerebral atrophy without an acute intracranial abnormality. Electronically Signed   By: Virgina Norfolk M.D.   On: 01/05/2021 20:59   CT Cervical Spine Wo Contrast  Result Date: 01/05/2021 CLINICAL DATA:  Syncope and subsequent fall. EXAM: CT CERVICAL  SPINE WITHOUT CONTRAST TECHNIQUE: Multidetector CT imaging of the cervical spine was performed without intravenous contrast. Multiplanar CT image  reconstructions were also generated. COMPARISON:  None. FINDINGS: Alignment: Approximally 1 mm to 2 mm retrolisthesis of the C5 vertebral body is noted on C6. Skull base and vertebrae: No acute fracture. Chronic and degenerative changes are seen involving the body and tip of the dens. No primary bone lesion or focal pathologic process. Soft tissues and spinal canal: No prevertebral fluid or swelling. No visible canal hematoma. Disc levels: Marked severity endplate sclerosis is seen at the level of C5-C6 with moderate severity anterior osteophyte formation noted at the levels of C4-C5 and C5-C6. There is moderate severity narrowing of the anterior atlantoaxial articulation. Marked severity intervertebral disc space narrowing is seen at the level of C5-C6. Mild intervertebral disc space narrowing is present throughout the remainder of the cervical spine. Bilateral marked severity multilevel facet joint hypertrophy is noted. Upper chest: Negative. Other: None. IMPRESSION: 1. Marked severity degenerative changes, most prominent at the level of C5-C6. 2. No acute cervical spine fracture. Electronically Signed   By: Virgina Norfolk M.D.   On: 01/05/2021 21:03   DG Chest Port 1 View  Result Date: 01/05/2021 CLINICAL DATA:  Syncope, fall EXAM: PORTABLE CHEST 1 VIEW COMPARISON:  11/25/2016 FINDINGS: Insert lung No pleural effusion or pneumothorax. The heart is top-normal in size. Degenerative changes of the right shoulder. IMPRESSION: No evidence of acute cardiopulmonary disease. Electronically Signed   By: Julian Hy M.D.   On: 01/05/2021 20:29   ECHOCARDIOGRAM COMPLETE  Result Date: 01/06/2021    ECHOCARDIOGRAM REPORT   Patient Name:   Loretta Webb Date of Exam: 01/06/2021 Medical Rec #:  [id number]       Height:       63.0 in Accession #:    [id number]      Weight:       152.6 lb Date of Birth:  [birth year]       BSA:          1.723 m Patient Age:    73 years        BP:           149/89 mmHg Patient  Gender: F               HR:           118 bpm. Exam Location:  [facility] Procedure: 2D Echo, Color Doppler and Cardiac Doppler Indications:     R55 Syncope  History:         Patient has no prior history of Echocardiogram examinations.                  Arrythmias:Atrial Fibrillation; Risk Factors:Hypertension,                  Diabetes and Sleep Apnea.  Sonographer:     Charmayne Sheer Referring Phys:  [id number] Elwyn Reach Diagnosing Phys: Kate Sable MD  Sonographer Comments: Suboptimal subcostal window. IMPRESSIONS  1. Left ventricular ejection fraction, by estimation, is 50 to 55%. The left ventricle has low normal function. The left ventricle has no regional wall motion abnormalities. Left ventricular diastolic parameters are indeterminate.  2. Right ventricular systolic function is normal. The right ventricular size is normal.  3. Left atrial size was mildly dilated.  4. The mitral valve is normal in structure. Mild mitral valve regurgitation.  5. The aortic valve is tricuspid. Aortic valve regurgitation is not visualized. Mild  aortic valve sclerosis is present, with no evidence of aortic valve stenosis.  6. The inferior vena cava is normal in size with greater than 50% respiratory variability, suggesting right atrial pressure of 3 mmHg. FINDINGS  Left Ventricle: Left ventricular ejection fraction, by estimation, is 50 to 55%. The left ventricle has low normal function. The left ventricle has no regional wall motion abnormalities. The left ventricular internal cavity size was normal in size. There is no left ventricular hypertrophy. Left ventricular diastolic parameters are indeterminate. Right Ventricle: The right ventricular size is normal. No increase in right ventricular wall thickness. Right ventricular systolic function is normal. Left Atrium: Left atrial size was mildly dilated. Right Atrium: Right atrial size was normal in size. Pericardium: There is no evidence of pericardial effusion. Mitral Valve: The  mitral valve is normal in structure. Mild mitral valve regurgitation. MV peak gradient, 3.8 mmHg. The mean mitral valve gradient is 1.0 mmHg. Tricuspid Valve: The tricuspid valve is normal in structure. Tricuspid valve regurgitation is mild. Aortic Valve: The aortic valve is tricuspid. Aortic valve regurgitation is not visualized. Mild aortic valve sclerosis is present, with no evidence of aortic valve stenosis. Aortic valve mean gradient measures 3.0 mmHg. Aortic valve peak gradient measures 5.0 mmHg. Aortic valve area, by VTI measures 1.60 cm. Pulmonic Valve: The pulmonic valve was not well visualized. Pulmonic valve regurgitation is trivial. Aorta: The aortic root is normal in size and structure. Venous: The inferior vena cava is normal in size with greater than 50% respiratory variability, suggesting right atrial pressure of 3 mmHg. IAS/Shunts: No atrial level shunt detected by color flow Doppler.  LEFT VENTRICLE PLAX 2D LVIDd:         4.69 cm  Diastology LVIDs:         3.39 cm  LV e' medial:    8.38 cm/s LV PW:         1.12 cm  LV E/e' medial:  8.7 LV IVS:        0.94 cm  LV e' lateral:   13.60 cm/s LVOT diam:     2.00 cm  LV E/e' lateral: 5.3 LV SV:         30 LV SV Index:   17 LVOT Area:     3.14 cm  RIGHT VENTRICLE RV Basal diam:  3.97 cm RV S prime:     8.92 cm/s LEFT ATRIUM             Index       RIGHT ATRIUM           Index LA diam:        4.40 cm 2.55 cm/m  RA Area:     18.50 cm LA Vol (A2C):   47.2 ml 27.39 ml/m RA Volume:   52.30 ml  30.35 ml/m LA Vol (A4C):   60.4 ml 35.05 ml/m LA Biplane Vol: 57.4 ml 33.31 ml/m  AORTIC VALVE                   PULMONIC VALVE AV Area (Vmax):    2.18 cm    PV Vmax:       0.78 m/s AV Area (Vmean):   1.86 cm    PV Vmean:      51.700 cm/s AV Area (VTI):     1.60 cm    PV VTI:        0.100 m AV Vmax:           112.00 cm/s PV  Peak grad:  2.4 mmHg AV Vmean:          80.400 cm/s PV Mean grad:  1.0 mmHg AV VTI:            0.187 m AV Peak Grad:      5.0 mmHg AV Mean  Grad:      3.0 mmHg LVOT Vmax:         77.70 cm/s LVOT Vmean:        47.500 cm/s LVOT VTI:          0.095 m LVOT/AV VTI ratio: 0.51  AORTA Ao Root diam: 3.40 cm MITRAL VALVE               TRICUSPID VALVE MV Area (PHT): 4.24 cm    TR Peak grad:   19.2 mmHg MV Area VTI:   1.74 cm    TR Vmax:        219.00 cm/s MV Peak grad:  3.8 mmHg MV Mean grad:  1.0 mmHg    SHUNTS MV Vmax:       0.97 m/s    Systemic VTI:  0.10 m MV Vmean:      53.2 cm/s   Systemic Diam: 2.00 cm MV Decel Time: 179 msec MV E velocity: 72.65 cm/s Kate Sable MD Electronically signed by Kate Sable MD Signature Date/Time: 01/06/2021/12:51:11 PM    Final      Assessment and Plan:   Atrial fibrillation with rapid ventricular response -CHA2DS2-VASc 4 (age, dm. Gender) -Not on anticoagulation due to frequent falls -Blood pressure improved, start Lopressor 12.5 mg twice daily.  Titrate beta-blocker as blood pressure permits to control heart rates.  Consider digoxin if patient remains tachycardic and blood pressures on the low side. -Echo with preserved EF  2.  Sepsis -Management as per primary team  Total encounter time 110 minutes  Greater than 50% was spent in counseling and coordination of care with the patient      Signed, Kate Sable, MD  01/06/2021 12:52 PM

## 2021-01-06 NOTE — Progress Notes (Signed)
Pharmacy Antibiotic Note  Loretta Webb is a 87 y.o. female admitted on 01/05/2021 with sepsis.  Pharmacy has been consulted for Cefepime, Vancomycin dosing.  Plan: Cefepime 2 gm IV X 1 given in ED on 8/30 @ 2147. Cefepime 2 gm IV Q12H ordered to continue on 8/31 @ 1000.  Vancomycin 1 gm IV X 1 given in ED on 8/30 @ 2244. Additional Vancomycin 750 mg IV X 1 ordered to make total loading dose of 1750 mg.  Vancomycin 1250 mg IV Q48H ordered to start on 9/1 @ 2300.   AUC = 454.5 Vanc trough = 9.8  Height: 5\' 3"  (160 cm) Weight: 72 kg (158 lb 11.7 oz) IBW/kg (Calculated) : 52.4  Temp (24hrs), Avg:97.4 F (36.3 C), Min:97.4 F (36.3 C), Max:97.4 F (36.3 C)  Recent Labs  Lab 01/05/21 1947 01/05/21 2030  WBC 11.7*  --   CREATININE 1.23*  --   LATICACIDVEN 3.7* 2.6*    Estimated Creatinine Clearance: 30.6 mL/min (A) (by C-G formula based on SCr of 1.23 mg/dL (H)).    No Known Allergies  Antimicrobials this admission:   >>    >>   Dose adjustments this admission:   Microbiology results:  BCx:   UCx:    Sputum:    MRSA PCR:   Thank you for allowing pharmacy to be a part of this patient's care.  Azile Minardi D 01/06/2021 1:00 AM

## 2021-01-06 NOTE — Progress Notes (Signed)
Pharmacy Antibiotic Note  Loretta Webb is a 87 y.o. female admitted on 01/05/2021 with sepsis.  Pharmacy has been consulted for Cefepime, Vancomycin dosing.  -unknown source  Plan: - Cefepime 2 gm IV Q12H  -Vancomycin 1 gm IV X 1 given in ED on 8/30 @ 2244. Additional Vancomycin 750 mg IV X 1 ordered to make total loading dose of 1750 mg.  Vancomycin 1250 mg IV Q48H ordered to start on 9/1 @ 2300.  AUC = 454.5 Vanc trough = 9.8  8/31:  *Scr improved  1.23 >> 1.06 Will adjust Vancomycin dosing to 750 IV q24h Goal AUC 400-550. Expected AUC: 500 Cmin 14.7 SCr used: 1.06  F/u sepsis source, renal fxn  -continue cefepime 2gm IV q12h     Height: 5\' 3"  (160 cm) Weight: 69.2 kg (152 lb 8.9 oz) IBW/kg (Calculated) : 52.4  Temp (24hrs), Avg:97.9 F (36.6 C), Min:97.4 F (36.3 C), Max:98.3 F (36.8 C)  Recent Labs  Lab 01/05/21 1947 01/05/21 2030 01/06/21 0100  WBC 11.7*  --  13.2*  CREATININE 1.23*  --  1.06*  LATICACIDVEN 3.7* 2.6*  --      Estimated Creatinine Clearance: 34.9 mL/min (A) (by C-G formula based on SCr of 1.06 mg/dL (H)).    No Known Allergies  Antimicrobials this admission: Cefepime 8/30 (evening) >> Vanc 8/30 (evening) >> Dose adjustments this admission:   Microbiology results:  8/30 BCx: NGTD 8/30  UCx:  UA looks neg  Sputum:  0  MRSA PCR: 0  Thank you for allowing pharmacy to be a part of this patient's care.  Kristal Perl A 01/06/2021 8:39 AM

## 2021-01-06 NOTE — ED Notes (Signed)
Patient reports having a "stuffy nose" and is requesting some nasal spray. Her daughter also called stating that the pt will also need her Seroquel for her anxiety. Manuela Schwartz, NP messaged for orders.

## 2021-01-07 DIAGNOSIS — E1169 Type 2 diabetes mellitus with other specified complication: Secondary | ICD-10-CM

## 2021-01-07 DIAGNOSIS — R55 Syncope and collapse: Secondary | ICD-10-CM

## 2021-01-07 DIAGNOSIS — E860 Dehydration: Secondary | ICD-10-CM

## 2021-01-07 DIAGNOSIS — I959 Hypotension, unspecified: Secondary | ICD-10-CM

## 2021-01-07 DIAGNOSIS — E782 Mixed hyperlipidemia: Secondary | ICD-10-CM

## 2021-01-07 DIAGNOSIS — I4891 Unspecified atrial fibrillation: Secondary | ICD-10-CM | POA: Diagnosis not present

## 2021-01-07 LAB — CREATININE, SERUM
Creatinine, Ser: 0.96 mg/dL (ref 0.44–1.00)
GFR, Estimated: 57 mL/min — ABNORMAL LOW (ref >60)

## 2021-01-07 LAB — GLUCOSE, CAPILLARY
Glucose-Capillary: 140 mg/dL — ABNORMAL HIGH (ref 70–99)
Glucose-Capillary: 193 mg/dL — ABNORMAL HIGH (ref 70–99)
Glucose-Capillary: 77 mg/dL (ref 70–99)
Glucose-Capillary: 128 mg/dL — ABNORMAL HIGH (ref 70–99)
Glucose-Capillary: 117 mg/dL — ABNORMAL HIGH (ref 70–99)

## 2021-01-07 LAB — CBC
HCT: 32.7 % — ABNORMAL LOW (ref 36.0–46.0)
Hemoglobin: 10.3 g/dL — ABNORMAL LOW (ref 12.0–15.0)
MCH: 25.6 pg — ABNORMAL LOW (ref 26.0–34.0)
MCHC: 31.5 g/dL (ref 30.0–36.0)
MCV: 81.3 fL (ref 80.0–100.0)
Platelets: 317 10*3/uL (ref 150–400)
RBC: 4.02 MIL/uL (ref 3.87–5.11)
RDW: 18.7 % — ABNORMAL HIGH (ref 11.5–15.5)
WBC: 10.8 10*3/uL — ABNORMAL HIGH (ref 4.0–10.5)
nRBC: 0 % (ref 0.0–0.2)

## 2021-01-07 LAB — URINE CULTURE

## 2021-01-07 LAB — LACTIC ACID, PLASMA: Lactic Acid, Venous: 2.3 mmol/L (ref 0.5–1.9)

## 2021-01-07 LAB — TSH: TSH: 3.528 u[IU]/mL (ref 0.350–4.500)

## 2021-01-07 MED ORDER — METOPROLOL TARTRATE 25 MG PO TABS
25.0000 mg | ORAL_TABLET | Freq: Once | ORAL | Status: AC
  Administered 2021-01-07: 25 mg via ORAL
  Filled 2021-01-07: qty 1

## 2021-01-07 MED ORDER — DIGOXIN 0.25 MG/ML IJ SOLN
0.2500 mg | Freq: Once | INTRAMUSCULAR | Status: AC
  Administered 2021-01-07: 0.25 mg via INTRAVENOUS
  Filled 2021-01-07 (×2): qty 2

## 2021-01-07 MED ORDER — DIGOXIN 125 MCG PO TABS
0.1250 mg | ORAL_TABLET | Freq: Every day | ORAL | Status: DC
  Administered 2021-01-08 – 2021-01-12 (×5): 0.125 mg via ORAL
  Filled 2021-01-07 (×5): qty 1

## 2021-01-07 MED ORDER — LORAZEPAM 2 MG/ML IJ SOLN
0.5000 mg | Freq: Once | INTRAMUSCULAR | Status: AC
  Administered 2021-01-07: 0.5 mg via INTRAVENOUS
  Filled 2021-01-07: qty 1

## 2021-01-07 MED ORDER — MIDODRINE HCL 5 MG PO TABS
2.5000 mg | ORAL_TABLET | Freq: Three times a day (TID) | ORAL | Status: DC
  Administered 2021-01-07 – 2021-01-08 (×5): 2.5 mg via ORAL
  Filled 2021-01-07 (×5): qty 1

## 2021-01-07 MED ORDER — METOPROLOL TARTRATE 25 MG PO TABS
25.0000 mg | ORAL_TABLET | Freq: Two times a day (BID) | ORAL | Status: DC
  Administered 2021-01-07 – 2021-01-09 (×4): 25 mg via ORAL
  Filled 2021-01-07 (×4): qty 1

## 2021-01-07 NOTE — TOC Initial Note (Signed)
Transition of Care [facility]) - Initial/Assessment Note    Patient Details  Name: Loretta Webb MRN: [id number] Date of Birth: 08-17-1931  [facility]) CM/SW Contact:    Allayne Butcher, RN Phone Number: 01/07/2021, 2:32 PM  Clinical Narrative:                  Patient admitted to the hospital after syncopal episode and found to be in a fib with RVR, patient has a history of afib.  RNCM spoke with patient's son Molly Maduro via phone about discharge planning.  Molly Maduro and his sisters have talked and would like to see the patient go to rehab and work on strengthening.  Patient has been having falls at home and they do not believe she is safe to be alone anymore.  They would like to see her return to home after rehab with maybe some caregivers set up.   Patient has a walker but does not use it, son reports that when she goes out she will use her cane.  Patient drives short distances but family is usually with her at medical appointments.    PT and OT consults placed.  TOC will cont to follow and work on SNF if appropriate.    Expected Discharge Plan: Skilled Nursing Facility Barriers to Discharge: Continued Medical Work up   Patient Goals and CMS Choice Patient states their goals for this hospitalization and ongoing recovery are:: Family wants patient to go for rehab, the ultimate goal is for patient to get strong enough to return home CMS Medicare.gov Compare Post Acute Care list provided to:: Patient Represenative (must comment) Choice offered to / list presented to : Adult Children  Expected Discharge Plan and Services Expected Discharge Plan: Skilled Nursing Facility   Discharge Planning Services: CM Consult Post Acute Care Choice: Skilled Nursing Facility Living arrangements for the past 2 months: Single Family Home                 DME Arranged: N/A DME Agency: NA       HH Arranged: NA          Prior Living Arrangements/Services Living arrangements for the past 2  months: Single Family Home Lives with:: Self Patient language and need for interpreter reviewed:: Yes Do you feel safe going back to the place where you live?: Yes      Need for Family Participation in Patient Care: Yes (Comment) (falls, afib) Care giver support system in place?: Yes (comment) (children) Current home services: DME (walker and cane) Criminal Activity/Legal Involvement Pertinent to Current Situation/Hospitalization: No - Comment as needed  Activities of Daily Living Home Assistive Devices/Equipment: Walker (specify type) ADL Screening (condition at time of admission) Patient's cognitive ability adequate to safely complete daily activities?: Yes Is the patient deaf or have difficulty hearing?: Yes Does the patient have difficulty seeing, even when wearing glasses/contacts?: No Does the patient have difficulty concentrating, remembering, or making decisions?: No Patient able to express need for assistance with ADLs?: Yes Does the patient have difficulty dressing or bathing?: Yes Independently performs ADLs?: No Does the patient have difficulty walking or climbing stairs?: Yes Weakness of Legs: Both Weakness of Arms/Hands: Both  Permission Sought/Granted Permission sought to share information with : Case Manager, Family Supports, Oceanographer granted to share information with : Yes, Verbal Permission Granted  Share Information with NAME: Kemesha Mosey  Permission granted to share info w AGENCY: SNF's  Permission granted to share info w Relationship:  son  Permission granted to share info w Contact Information: [phone]  Emotional Assessment       Orientation: : Oriented to Self, Oriented to Place, Oriented to  Time, Oriented to Situation Alcohol / Substance Use: Not Applicable Psych Involvement: No (comment)  Admission diagnosis:  Syncope and collapse [R55] Elevated lactic acid level [R79.89] Hypotension, unspecified hypotension  type [I95.9] Syncope, unspecified syncope type [R55] Patient Active Problem List   Diagnosis Date Noted   Atrial fibrillation with rapid ventricular response (HCC) 01/06/2021   Dehydration 01/06/2021   Hypotension 01/06/2021   Hypokalemia 01/06/2021   Lactic acidosis 01/06/2021   Syncope and collapse 01/05/2021   SI joint arthritis 06/24/2020   Chronic radicular lumbar pain 06/04/2020   Primary osteoarthritis of right hip 02/24/2020   Primary osteoarthritis of left hip 02/24/2020   Localized osteoarthritis of shoulder regions, bilateral 01/20/2020   History of rotator cuff syndrome (bilateral) 01/20/2020   Right hip pain 09/03/2019   Chronic low back pain (1ry area of Pain) (Bilateral) (R>L) w/o sciatica 09/03/2019   Chronic shoulder pain (3ry area of Pain) (Bilateral) (L>R) 09/03/2019   Chronic lower extremity pain (4th area of Pain) (Bilateral) (R>L) 09/03/2019   Diabetic peripheral neuropathy (HCC) 09/03/2019   Neurogenic pain 09/03/2019   Lumbar facet syndrome (Bilateral) (R>L) 09/03/2019   Lumbar facet hypertrophy 09/03/2019   DDD (degenerative disc disease), lumbosacral 09/03/2019   Grade 1 Anterolisthesis of L3/L4 09/03/2019   Chronic pain syndrome 09/02/2019   Pharmacologic therapy 09/02/2019   Disorder of skeletal system 09/02/2019   Problems influencing health status 09/02/2019   Abnormal MRI, lumbar spine (01/13/2019) 09/02/2019   Abnormal MRI, cervical spine (2014) 09/02/2019   DM type 2 with diabetic mixed hyperlipidemia (HCC) 12/04/2018   Low serum vitamin D 05/29/2018   Nocturnal hypoxia 05/04/2016   Hyperlipidemia, mixed 10/28/2015   Cephalalgia 07/24/2015   Neuritis or radiculitis due to rupture of lumbar intervertebral disc 01/05/2015   DDD (degenerative disc disease), cervical 04/14/2014   B12 deficiency 12/23/2013   Benign essential HTN 12/23/2013   Type 2 diabetes mellitus (HCC) 12/23/2013   OSA (obstructive sleep apnea) 12/23/2013   Lumbar canal  stenosis 09/18/2013   Lumbar central spinal stenosis w/ neurogenic claudication 09/18/2013   PCP:  [name], MD Pharmacy:   [facility] ([facility]) - [place], [place] - [address] [place] [address] Phone: [phone] Fax: [phone]     Social Determinants of Health (SDOH) Interventions    Readmission Risk Interventions No flowsheet data found.

## 2021-01-07 NOTE — Progress Notes (Addendum)
Progress Note  Patient Name: Loretta Webb Date of Encounter: 01/07/2021  Primary Cardiologist:Dr. Agbor-Etang  Subjective   No current chest pain, feelings of racing heart rate, palpitations, presyncope.    She has not yet been up to ambulate.  She denies working recently with PT or OT.  Daughter present at the time of her interview.  Per daughter, patient reportedly lives at home with history of falls and does not use her walker or cane thus has frequent mechanical falls. The two falls leading to this admission were associated with LOC of unknown duration per patient and daughter.    Per daughter, the patient used to be on Eliquis in the past for atrial fibrillation but stopped it sometime before this admission for reason unknown.  Per daughter, patient has history of untreated sleep apnea.  Inpatient Medications    Scheduled Meds:  aspirin  81 mg Oral Daily   cholecalciferol  1,000 Units Oral Weekly   donepezil  10 mg Oral Daily   DULoxetine  60 mg Oral Daily   enoxaparin (LOVENOX) injection  40 mg Subcutaneous Q24H   insulin aspart  0-20 Units Subcutaneous TID WC   insulin aspart  0-5 Units Subcutaneous QHS   magnesium oxide  200 mg Oral Daily   metoprolol tartrate  12.5 mg Oral BID   mirtazapine  15 mg Oral QHS   pantoprazole  40 mg Oral Daily   pravastatin  40 mg Oral QHS   QUEtiapine  25 mg Oral BID   sodium chloride flush  3 mL Intravenous Q12H   vitamin B-12  5,000 mcg Oral Daily   Continuous Infusions:  0.9 % NaCl with KCl 20 mEq / L 125 mL/hr at 01/07/21 0222   ceFEPime (MAXIPIME) IV Stopped (01/07/21 0037)   vancomycin 750 mg (01/07/21 0221)   PRN Meds: acetaminophen, ondansetron **OR** ondansetron (ZOFRAN) IV, sodium chloride   Vital Signs    Vitals:   01/07/21 0025 01/07/21 0107 01/07/21 0440 01/07/21 0820  BP: 94/65 102/60 132/81 129/84  Pulse: 93 78 98 (!) 110  Resp: Temp: 97.8 F (36.6 C)  98.4 F (36.9 C) 98.3 F (36.8 C)   TempSrc:    Oral  SpO2: 90%  90% 98%  Weight:   72 kg   Height:        Intake/Output Summary (Last 24 hours) at 01/07/2021 1018 Last data filed at 01/07/2021 0703 Gross per 24 hour  Intake 2163.27 ml  Output --  Net 2163.27 ml   Last 3 Weights 01/07/2021 01/06/2021 01/05/2021  Weight (lbs) 158 lb 11.7 oz 152 lb 8.9 oz 158 lb 11.7 oz  Weight (kg) 72 kg 69.2 kg 72 kg      Telemetry    A. fib with ventricular rates up to 120s- Personally Reviewed  ECG    No new tracings- Personally Reviewed  Physical Exam   GEN: No acute distress.  Facial bruising noted, as well as bruising along the left lower extremity.  Hard of hearing.  Joined by her daughter. Neck: No JVD.  No carotid bruits. Cardiac: IRIR and tachycardic, 1/6 systolic murmur.  No rubs, or gallops.  Respiratory: Clear to auscultation bilaterally. GI: Soft, nontender, non-distended  MS: No edema with left lower extremity showing tibial bruising; No deformity. Neuro:  Nonfocal  Psych: Normal affect   Labs    High Sensitivity Troponin:  No results for input(s): TROPONINIHS in the last 720 hours.    Chemistry  Recent Labs  Lab 01/05/21 1947 01/06/21 0100 01/07/21 0414  NA 136 136  --   K 3.4* 3.6  --   CL 100 101  --   CO2 24 26  --   GLUCOSE 216* 112*  --   BUN 23 21  --   CREATININE 1.23* 1.06* 0.96  CALCIUM 8.8* 9.0  --   PROT 6.2* 6.7  --   ALBUMIN 3.6 3.8  --   AST 29 27  --   ALT 12 11  --   ALKPHOS 41 43  --   BILITOT 0.6 0.6  --   GFRNONAA 43* 51* 57*  ANIONGAP 12 9  --      Hematology Recent Labs  Lab 01/05/21 1947 01/06/21 0100 01/07/21 0414  WBC 11.7* 13.2* 10.8*  RBC 4.24 4.33 4.02  HGB 10.8* 11.2* 10.3*  HCT 33.8* 34.2* 32.7*  MCV 79.7* 79.0* 81.3  MCH 25.5* 25.9* 25.6*  MCHC 32.0 32.7 31.5  RDW 18.6* 18.4* 18.7*  PLT 344 353 317    BNPNo results for input(s): BNP, PROBNP in the last 168 hours.   DDimer No results for input(s): DDIMER in the last 168 hours.   Radiology    CT  HEAD WO CONTRAST ( )  Result Date: 01/05/2021 CLINICAL DATA:  Syncope and subsequent fall. EXAM: CT HEAD WITHOUT CONTRAST TECHNIQUE: Contiguous axial images were obtained from the base of the skull through the vertex without intravenous contrast. COMPARISON:  May 05, 2018 FINDINGS: Brain: There is mild cerebral atrophy with widening of the extra-axial spaces and ventricular dilatation. There are areas of decreased attenuation within the white matter tracts of the supratentorial brain, consistent with microvascular disease changes. Vascular: No hyperdense vessel or unexpected calcification. Skull: Hyperostosis frontalis interna is again seen. Negative for fracture or focal lesion. Sinuses/Orbits: No acute finding. Other: None. IMPRESSION: Generalized cerebral atrophy without an acute intracranial abnormality. Electronically Signed   By: Aram Candela M.D.   On: 01/05/2021 20:59   CT Cervical Spine Wo Contrast  Result Date: 01/05/2021 CLINICAL DATA:  Syncope and subsequent fall. EXAM: CT CERVICAL SPINE WITHOUT CONTRAST TECHNIQUE: Multidetector CT imaging of the cervical spine was performed without intravenous contrast. Multiplanar CT image reconstructions were also generated. COMPARISON:  None. FINDINGS: Alignment: Approximally 1 mm to 2 mm retrolisthesis of the C5 vertebral body is noted on C6. Skull base and vertebrae: No acute fracture. Chronic and degenerative changes are seen involving the body and tip of the dens. No primary bone lesion or focal pathologic process. Soft tissues and spinal canal: No prevertebral fluid or swelling. No visible canal hematoma. Disc levels: Marked severity endplate sclerosis is seen at the level of C5-C6 with moderate severity anterior osteophyte formation noted at the levels of C4-C5 and C5-C6. There is moderate severity narrowing of the anterior atlantoaxial articulation. Marked severity intervertebral disc space narrowing is seen at the level of C5-C6. Mild  intervertebral disc space narrowing is present throughout the remainder of the cervical spine. Bilateral marked severity multilevel facet joint hypertrophy is noted. Upper chest: Negative. Other: None. IMPRESSION: 1. Marked severity degenerative changes, most prominent at the level of C5-C6. 2. No acute cervical spine fracture. Electronically Signed   By: Aram Candela M.D.   On: 01/05/2021 21:03   DG Chest Port 1 View  Result Date: 01/05/2021 CLINICAL DATA:  Syncope, fall EXAM: PORTABLE CHEST 1 VIEW COMPARISON:  11/25/2016 FINDINGS: Insert lung No pleural effusion or pneumothorax. The heart is top-normal in size. Degenerative  changes of the right shoulder. IMPRESSION: No evidence of acute cardiopulmonary disease. Electronically Signed   By: Charline Bills M.D.   On: 01/05/2021 20:29   ECHOCARDIOGRAM COMPLETE  Result Date: 01/06/2021    ECHOCARDIOGRAM REPORT   Patient Name:   Loretta Webb Date of Exam: 01/06/2021 Medical Rec #:  [id number]       Height:       63.0 in Accession #:    [id number]      Weight:       152.6 lb Date of Birth:  December 27, 1931       BSA:          1.723 m Patient Age:    87 years        BP:           149/89 mmHg Patient Gender: F               HR:           118 bpm. Exam Location:  [facility] Procedure: 2D Echo, Color Doppler and Cardiac Doppler Indications:     R55 Syncope  History:         Patient has no prior history of Echocardiogram examinations.                  Arrythmias:Atrial Fibrillation; Risk Factors:Hypertension,                  Diabetes and Sleep Apnea.  Sonographer:     Humphrey Rolls Referring Phys:  [id number] Rometta Emery Diagnosing Phys: Debbe Odea MD  Sonographer Comments: Suboptimal subcostal window. IMPRESSIONS  1. Left ventricular ejection fraction, by estimation, is 50 to 55%. The left ventricle has low normal function. The left ventricle has no regional wall motion abnormalities. Left ventricular diastolic parameters are indeterminate.  2. Right ventricular  systolic function is normal. The right ventricular size is normal.  3. Left atrial size was mildly dilated.  4. The mitral valve is normal in structure. Mild mitral valve regurgitation.  5. The aortic valve is tricuspid. Aortic valve regurgitation is not visualized. Mild aortic valve sclerosis is present, with no evidence of aortic valve stenosis.  6. The inferior vena cava is normal in size with greater than 50% respiratory variability, suggesting right atrial pressure of 3 mmHg. FINDINGS  Left Ventricle: Left ventricular ejection fraction, by estimation, is 50 to 55%. The left ventricle has low normal function. The left ventricle has no regional wall motion abnormalities. The left ventricular internal cavity size was normal in size. There is no left ventricular hypertrophy. Left ventricular diastolic parameters are indeterminate. Right Ventricle: The right ventricular size is normal. No increase in right ventricular wall thickness. Right ventricular systolic function is normal. Left Atrium: Left atrial size was mildly dilated. Right Atrium: Right atrial size was normal in size. Pericardium: There is no evidence of pericardial effusion. Mitral Valve: The mitral valve is normal in structure. Mild mitral valve regurgitation. MV peak gradient, 3.8 mmHg. The mean mitral valve gradient is 1.0 mmHg. Tricuspid Valve: The tricuspid valve is normal in structure. Tricuspid valve regurgitation is mild. Aortic Valve: The aortic valve is tricuspid. Aortic valve regurgitation is not visualized. Mild aortic valve sclerosis is present, with no evidence of aortic valve stenosis. Aortic valve mean gradient measures 3.0 mmHg. Aortic valve peak gradient measures 5.0 mmHg. Aortic valve area, by VTI measures 1.60 cm. Pulmonic Valve: The pulmonic valve was not well visualized. Pulmonic valve regurgitation is trivial. Aorta: The aortic  root is normal in size and structure. Venous: The inferior vena cava is normal in size with greater  than 50% respiratory variability, suggesting right atrial pressure of 3 mmHg. IAS/Shunts: No atrial level shunt detected by color flow Doppler.  LEFT VENTRICLE PLAX 2D LVIDd:         4.69 cm  Diastology LVIDs:         3.39 cm  LV e' medial:    8.38 cm/s LV PW:         1.12 cm  LV E/e' medial:  8.7 LV IVS:        0.94 cm  LV e' lateral:   13.60 cm/s LVOT diam:     2.00 cm  LV E/e' lateral: 5.3 LV SV:         30 LV SV Index:   17 LVOT Area:     3.14 cm  RIGHT VENTRICLE RV Basal diam:  3.97 cm RV S prime:     8.92 cm/s LEFT ATRIUM             Index       RIGHT ATRIUM           Index LA diam:        4.40 cm 2.55 cm/m  RA Area:     18.50 cm LA Vol (A2C):   47.2 ml 27.39 ml/m RA Volume:   52.30 ml  30.35 ml/m LA Vol (A4C):   60.4 ml 35.05 ml/m LA Biplane Vol: 57.4 ml 33.31 ml/m  AORTIC VALVE                   PULMONIC VALVE AV Area (Vmax):    2.18 cm    PV Vmax:       0.78 m/s AV Area (Vmean):   1.86 cm    PV Vmean:      51.700 cm/s AV Area (VTI):     1.60 cm    PV VTI:        0.100 m AV Vmax:           112.00 cm/s PV Peak grad:  2.4 mmHg AV Vmean:          80.400 cm/s PV Mean grad:  1.0 mmHg AV VTI:            0.187 m AV Peak Grad:      5.0 mmHg AV Mean Grad:      3.0 mmHg LVOT Vmax:         77.70 cm/s LVOT Vmean:        47.500 cm/s LVOT VTI:          0.095 m LVOT/AV VTI ratio: 0.51  AORTA Ao Root diam: 3.40 cm MITRAL VALVE               TRICUSPID VALVE MV Area (PHT): 4.24 cm    TR Peak grad:   19.2 mmHg MV Area VTI:   1.74 cm    TR Vmax:        219.00 cm/s MV Peak grad:  3.8 mmHg MV Mean grad:  1.0 mmHg    SHUNTS MV Vmax:       0.97 m/s    Systemic VTI:  0.10 m MV Vmean:      53.2 cm/s   Systemic Diam: 2.00 cm MV Decel Time: 179 msec MV E velocity: 72.65 cm/s Debbe Odea MD Electronically signed by Debbe Odea MD Signature Date/Time: 01/06/2021/12:51:11 PM    Final  Cardiac Studies   Echo 01/06/21  1. Left ventricular ejection fraction, by estimation, is 50 to 55%. The  left ventricle has  low normal function. The left ventricle has no regional  wall motion abnormalities. Left ventricular diastolic parameters are  indeterminate.   2. Right ventricular systolic function is normal. The right ventricular  size is normal.   3. Left atrial size was mildly dilated.   4. The mitral valve is normal in structure. Mild mitral valve  regurgitation.   5. The aortic valve is tricuspid. Aortic valve regurgitation is not  visualized. Mild aortic valve sclerosis is present, with no evidence of  aortic valve stenosis.   6. The inferior vena cava is normal in size with greater than 50%  respiratory variability, suggesting right atrial pressure of 3 mmHg.   Patient Profile     87 y.o. female with history of atrial fibrillation, hypertension, DM2, frequent falls with walker and cane/lives alone at home, and seen today for the evaluation of atrial fibrillation s/p recent fall with LOC per daughter.  Assessment & Plan    Atrial fibrillation with RVR --Asymptomatic in atrial fibrillation.  Suspect this is a known diagnosis, as per daughter, patient on Eliquis in the past.  Echo with EF 50-55% and NRWMA. --Check labs. Daily BMET.   Replete potassium with goal 4.0.   Mg  2.4 and at goal 2.0.  Check TSH. --Plan for rate control only.   Increased to Lopressor 25 mg BID as ventricular rate above 110 bpm on telemetry. Consolidate Lopressor to Toprol before discharge.  Avoid amiodarone given risk of thromboembolic event with pharmacologic conversion.   Could consider digoxin if BP limits optimal rate control. Recent Cr stable. Will defer for now.  --No plan for anticoagulation given frequent falls.   CHA2DS2VASc score of at least 5 (HTN, age x2, DM2, female)l; however, risks of anticoagulation outweigh those of benefits at this time given anemia and frequent falls. --No plan for DCCV given not therapeutically anticoagulated. --Per daughter, history of untreated sleep apnea.  Given the patient's  age / comorbids including memory issues at times, no OSA workup at this time. Echo with mild LAE.  History of frequent falls Recent fall with LOC --Severely weakened, deconditioned.  Echo showed only mild MR and MR sclerosis.  11/2016 bilateral carotids with minimal plaque, right greater than left.  Could consider updating this carotid study, given recent fall with LOC.  Head CT without acute findings.  As above, she has known history of A. fib not on anticoagulation --> could consider chest CT per IM given LOC and to rule out PE.  No asymmetric swelling.  Given LOC, considered outpatient cardiac monitoring with Zio AT x2 weeks; however, given pt memory issues, she may not tolerate this monitoring. Replete K+.  Monitor anemia. Recommend PT/OT during admission and as an outpatient, given her history of falls and deconditioning.  Will defer to IM regarding further discussion surrounding living at home, given her history of frequent falls and as she is not using her walker.  Considered also has polypharmacy - consider weaning Ativan, Seroquel, and Remeron if feasible - defer to IM/psychiatry.  Hypokalemia/hypomagnesia --Replete K+ with goal 4.0.  Currently on IV potassium.  Continue home magnesium.  Anemia of unknown etiology --Anemia of unknown etiology.  Further work-up per IM.  Denies any signs or symptoms of bleeding.  Daily CBC.  No plan for anticoagulation as above.  Sepsis --Per IM.  Essential hypertension -- BP well controlled -  continue Lopressor.      For questions or updates, please contact [facility] Please consult [email or web address] for contact info under        Signed, Lennon Alstrom, PA-C  01/07/2021, 10:18 AM

## 2021-01-07 NOTE — Plan of Care (Signed)
  Problem: Education: Goal: Knowledge of condition and prescribed therapy will improve Outcome: Progressing   Problem: Cardiac: Goal: Will achieve and/or maintain adequate cardiac output Outcome: Progressing   Problem: Physical Regulation: Goal: Complications related to the disease process, condition or treatment will be avoided or minimized Outcome: Progressing   Problem: Education: Goal: Knowledge of General Education information will improve Description: Including pain rating scale, medication(s)/side effects and non-pharmacologic comfort measures Outcome: Progressing   Problem: Clinical Measurements: Goal: Ability to maintain clinical measurements within normal limits will improve Outcome: Progressing Goal: Will remain free from infection Outcome: Progressing Goal: Diagnostic test results will improve Outcome: Progressing Goal: Respiratory complications will improve Outcome: Progressing Goal: Cardiovascular complication will be avoided Outcome: Progressing   Problem: Nutrition: Goal: Adequate nutrition will be maintained Outcome: Progressing   Problem: Coping: Goal: Level of anxiety will decrease Outcome: Progressing   Problem: Elimination: Goal: Will not experience complications related to bowel motility Outcome: Progressing Goal: Will not experience complications related to urinary retention Outcome: Progressing   Problem: Pain Managment: Goal: General experience of comfort will improve Outcome: Progressing

## 2021-01-07 NOTE — Progress Notes (Signed)
PROGRESS NOTE    Loretta Webb  YTK:[id number] DOB: [birth year] DOA: 01/05/2021 PCP: [name], MD    Brief Narrative:   Loretta Webb is a 87 y.o. female with medical history significant of atrial fibrillation, chronic pain syndrome, osteoarthritis, diabetes type 2, GERD, fibromyalgia, history of esophageal stricture, essential hypertension and obstructive sleep apnea who was brought in from home after a witnessed syncopal episode this morning.  She had physical therapy at the time.  Did not come to the ER.  In the afternoon she had another 1.  Patient was evaluated by EMS where she was found to be in A. fib with RVR.  Rate it was in the 180s at the time.  She received 10 L of metoprolol.  Patient was noted to have oxygen sats of 92% on room air and was placed on 2 L of oxygen.  Brought into the ER where she was found to be hypotensive.  Patient is systolic blood pressure was in the 90 and diastolic 57.  Her heart rate was 110 on arrival.  She did have some bruises on lower and upper extremities some skin abrasions.  She is slightly confused but has baseline dementia although mild.  She was noted to also have some hypothermia, A. fib with RVR as well as leukocytosis.  Patient has lactic acidosis with suspicion for possible sepsis but source is unknown.  Empirically started on vancomycin and cefepime in the ER and being admitted to the hospital for evaluation of syncope..    9/1- felt anxious earlier per nsg, was given ativan, now she reports feeling better.  As Telemetry currently not functioning made nursing aware.  Consultants:  Cardiology  Procedures:   Antimicrobials:      Subjective: Is denies shortness, chest pain.  Feels better after Ativan.  Sometimes feels palpitations.  Objective: Vitals:   01/06/21 2034 01/07/21 0025 01/07/21 0107 01/07/21 0440  BP: 113/79 94/65 102/60 132/81  Pulse: 89 93 78 98  Resp: 15 20  20   Temp: 97.6 F (36.4 C) 97.8 F (36.6 C)  98.4 F  (36.9 C)  TempSrc:      SpO2: 95% 90%  90%  Weight:    72 kg  Height:        Intake/Output Summary (Last 24 hours) at 01/07/2021 0815 Last data filed at 01/07/2021 0703 Gross per 24 hour  Intake 2283.27 ml  Output --  Net 2283.27 ml   Filed Weights   01/05/21 2029 01/06/21 0401 01/07/21 0440  Weight: 72 kg 69.2 kg 72 kg    Examination:  General exam: Appears calm and comfortable  Respiratory system: Clear to auscultation. Respiratory effort normal. Cardiovascular system: irreg -reg mildly tachy, S1 & S2  +gallop Gastrointestinal system: Abdomen is nondistended, soft and nontender.  Normal bowel sounds heard. Central nervous system: aaxox3, grossly intact Extremities: no edema Psychiatry: Mood & affect appropriate.     Data Reviewed: I have personally reviewed following labs and imaging studies  CBC: Recent Labs  Lab 01/05/21 1947 01/06/21 0100  WBC 11.7* 13.2*  NEUTROABS 9.0*  --   HGB 10.8* 11.2*  HCT 33.8* 34.2*  MCV 79.7* 79.0*  PLT 344 353   Basic Metabolic Panel: Recent Labs  Lab 01/05/21 1947 01/06/21 0100 01/07/21 0414  NA 136 136  --   K 3.4* 3.6  --   CL 100 101  --   CO2 24 26  --   GLUCOSE 216* 112*  --   BUN  23 21  --   CREATININE 1.23* 1.06* 0.96  CALCIUM 8.8* 9.0  --   MG  --  2.4  --    GFR: Estimated Creatinine Clearance: 39.2 mL/min (by C-G formula based on SCr of 0.96 mg/dL). Liver Function Tests: Recent Labs  Lab 01/05/21 1947 01/06/21 0100  AST 29 27  ALT 12 11  ALKPHOS 41 43  BILITOT 0.6 0.6  PROT 6.2* 6.7  ALBUMIN 3.6 3.8   No results for input(s): LIPASE, AMYLASE in the last 168 hours. No results for input(s): AMMONIA in the last 168 hours. Coagulation Profile: Recent Labs  Lab 01/05/21 1947  INR 1.1   Cardiac Enzymes: No results for input(s): CKTOTAL, CKMB, CKMBINDEX, TROPONINI in the last 168 hours. BNP (last 3 results) No results for input(s): PROBNP in the last 8760 hours. HbA1C: Recent Labs     01/06/21 0100  HGBA1C 6.6*   CBG: Recent Labs  Lab 01/06/21 0835 01/06/21 1142 01/06/21 1640 01/06/21 2036 01/07/21 0517  GLUCAP 112* 110* 128* 95 140*   Lipid Profile: No results for input(s): CHOL, HDL, LDLCALC, TRIG, CHOLHDL, LDLDIRECT in the last 72 hours. Thyroid Function Tests: No results for input(s): TSH, T4TOTAL, FREET4, T3FREE, THYROIDAB in the last 72 hours. Anemia Panel: No results for input(s): VITAMINB12, FOLATE, FERRITIN, TIBC, IRON, RETICCTPCT in the last 72 hours. Sepsis Labs: Recent Labs  Lab 01/05/21 1947 01/05/21 2030  LATICACIDVEN 3.7* 2.6*    Recent Results (from the past 240 hour(s))  Resp Panel by RT-PCR (Flu A&B, Covid) Nasopharyngeal Swab     Status: None   Collection Time: 01/05/21  7:47 PM   Specimen: Nasopharyngeal Swab; Nasopharyngeal(NP) swabs in vial transport medium  Result Value Ref Range Status   SARS Coronavirus 2 by RT PCR NEGATIVE NEGATIVE Final    Comment: (NOTE) SARS-CoV-2 target nucleic acids are NOT DETECTED.  The SARS-CoV-2 RNA is generally detectable in upper respiratory specimens during the acute phase of infection. The lowest concentration of SARS-CoV-2 viral copies this assay can detect is 138 copies/mL. A negative result does not preclude SARS-Cov-2 infection and should not be used as the sole basis for treatment or other patient management decisions. A negative result may occur with  improper specimen collection/handling, submission of specimen other than nasopharyngeal swab, presence of viral mutation(s) within the areas targeted by this assay, and inadequate number of viral copies(<138 copies/mL). A negative result must be combined with clinical observations, patient history, and epidemiological information. The expected result is Negative.  Fact Sheet for Patients:  BloggerCourse.com  Fact Sheet for Healthcare Providers:  SeriousBroker.it  This test is no t yet  approved or cleared by the [place] FDA and  has been authorized for detection and/or diagnosis of SARS-CoV-2 by FDA under an Emergency Use Authorization (EUA). This EUA will remain  in effect (meaning this test can be used) for the duration of the COVID-19 declaration under Section 564(b)(1) of the Act, 21 U.S.C.section 360bbb-3(b)(1), unless the authorization is terminated  or revoked sooner.       Influenza A by PCR NEGATIVE NEGATIVE Final   Influenza B by PCR NEGATIVE NEGATIVE Final    Comment: (NOTE) The Xpert Xpress SARS-CoV-2/FLU/RSV plus assay is intended as an aid in the diagnosis of influenza from Nasopharyngeal swab specimens and should not be used as a sole basis for treatment. Nasal washings and aspirates are unacceptable for Xpert Xpress SARS-CoV-2/FLU/RSV testing.  Fact Sheet for Patients: BloggerCourse.com  Fact Sheet for Healthcare Providers: SeriousBroker.it  This test is not yet approved or cleared by the [place] and has been authorized for detection and/or diagnosis of SARS-CoV-2 by FDA under an Emergency Use Authorization (EUA). This EUA will remain in effect (meaning this test can be used) for the duration of the COVID-19 declaration under Section 564(b)(1) of the Act, 21 U.S.C. section 360bbb-3(b)(1), unless the authorization is terminated or revoked.  Performed at [facility], [address] [place], [place] [address]   Blood Culture (routine x 2)     Status: None (Preliminary result)   Collection Time: 01/05/21  8:31 PM   Specimen: BLOOD  Result Value Ref Range Status   Specimen Description BLOOD LEFT ASSIST CONTROL  Final   Special Requests   Final    BOTTLES DRAWN AEROBIC AND ANAEROBIC Blood Culture results may not be optimal due to an inadequate volume of blood received in culture bottles   Culture   Final    NO GROWTH 2 DAYS Performed at [facility], [address] [place], [place] [address]    Report Status PENDING  Incomplete  Blood Culture (routine x 2)     Status: None (Preliminary result)   Collection Time: 01/05/21  8:31 PM   Specimen: BLOOD  Result Value Ref Range Status   Specimen Description BLOOD RIGHT HAND  Final   Special Requests   Final    BOTTLES DRAWN AEROBIC AND ANAEROBIC Blood Culture adequate volume   Culture   Final    NO GROWTH 2 DAYS Performed at [facility], [address] [place], [place] [address]    Report Status PENDING  Incomplete         Radiology Studies: CT HEAD WO CONTRAST ( )  Result Date: 01/05/2021 CLINICAL DATA:  Syncope and subsequent fall. EXAM: CT HEAD WITHOUT CONTRAST TECHNIQUE: Contiguous axial images were obtained from the base of the skull through the vertex without intravenous contrast. COMPARISON:  May 05, 2018 FINDINGS: Brain: There is mild cerebral atrophy with widening of the extra-axial spaces and ventricular dilatation. There are areas of decreased attenuation within the white matter tracts of the supratentorial brain, consistent with microvascular disease changes. Vascular: No hyperdense vessel or unexpected calcification. Skull: Hyperostosis frontalis interna is again seen. Negative for fracture or focal lesion. Sinuses/Orbits: No acute finding. Other: None. IMPRESSION: Generalized cerebral atrophy without an acute intracranial abnormality. Electronically Signed   By: Aram Candela M.D.   On: 01/05/2021 20:59   CT Cervical Spine Wo Contrast  Result Date: 01/05/2021 CLINICAL DATA:  Syncope and subsequent fall. EXAM: CT CERVICAL SPINE WITHOUT CONTRAST TECHNIQUE: Multidetector CT imaging of the cervical spine was performed without intravenous contrast. Multiplanar CT image reconstructions were also generated. COMPARISON:  None. FINDINGS: Alignment: Approximally 1 mm to 2 mm retrolisthesis of the C5 vertebral body is noted on C6. Skull base and vertebrae: No acute  fracture. Chronic and degenerative changes are seen involving the body and tip of the dens. No primary bone lesion or focal pathologic process. Soft tissues and spinal canal: No prevertebral fluid or swelling. No visible canal hematoma. Disc levels: Marked severity endplate sclerosis is seen at the level of C5-C6 with moderate severity anterior osteophyte formation noted at the levels of C4-C5 and C5-C6. There is moderate severity narrowing of the anterior atlantoaxial articulation. Marked severity intervertebral disc space narrowing is seen at the level of C5-C6. Mild intervertebral disc space narrowing is present throughout the remainder of the cervical spine. Bilateral marked severity multilevel facet  joint hypertrophy is noted. Upper chest: Negative. Other: None. IMPRESSION: 1. Marked severity degenerative changes, most prominent at the level of C5-C6. 2. No acute cervical spine fracture. Electronically Signed   By: Aram Candelahaddeus  Houston M.D.   On: 01/05/2021 21:03   DG Chest Port 1 View  Result Date: 01/05/2021 CLINICAL DATA:  Syncope, fall EXAM: PORTABLE CHEST 1 VIEW COMPARISON:  11/25/2016 FINDINGS: Insert lung No pleural effusion or pneumothorax. The heart is top-normal in size. Degenerative changes of the right shoulder. IMPRESSION: No evidence of acute cardiopulmonary disease. Electronically Signed   By: Charline BillsSriyesh  Krishnan M.D.   On: 01/05/2021 20:29   ECHOCARDIOGRAM COMPLETE  Result Date: 01/06/2021    ECHOCARDIOGRAM REPORT   Patient Name:   Loretta Webb Date of Exam: 01/06/2021 Medical Rec #:  [id number]       Height:       63.0 in Accession #:    [id number]      Weight:       152.6 lb Date of Birth:  Oct 13, 1931       BSA:          1.723 m Patient Age:    89 years        BP:           149/89 mmHg Patient Gender: F               HR:           118 bpm. Exam Location:  [facility] Procedure: 2D Echo, Color Doppler and Cardiac Doppler Indications:     R55 Syncope  History:         Patient has no prior history  of Echocardiogram examinations.                  Arrythmias:Atrial Fibrillation; Risk Factors:Hypertension,                  Diabetes and Sleep Apnea.  Sonographer:     Humphrey RollsJoan Heiss Referring Phys:  [id number] Rometta EmeryMOHAMMAD L GARBA Diagnosing Phys: Debbe OdeaBrian Agbor-Etang MD  Sonographer Comments: Suboptimal subcostal window. IMPRESSIONS  1. Left ventricular ejection fraction, by estimation, is 50 to 55%. The left ventricle has low normal function. The left ventricle has no regional wall motion abnormalities. Left ventricular diastolic parameters are indeterminate.  2. Right ventricular systolic function is normal. The right ventricular size is normal.  3. Left atrial size was mildly dilated.  4. The mitral valve is normal in structure. Mild mitral valve regurgitation.  5. The aortic valve is tricuspid. Aortic valve regurgitation is not visualized. Mild aortic valve sclerosis is present, with no evidence of aortic valve stenosis.  6. The inferior vena cava is normal in size with greater than 50% respiratory variability, suggesting right atrial pressure of 3 mmHg. FINDINGS  Left Ventricle: Left ventricular ejection fraction, by estimation, is 50 to 55%. The left ventricle has low normal function. The left ventricle has no regional wall motion abnormalities. The left ventricular internal cavity size was normal in size. There is no left ventricular hypertrophy. Left ventricular diastolic parameters are indeterminate. Right Ventricle: The right ventricular size is normal. No increase in right ventricular wall thickness. Right ventricular systolic function is normal. Left Atrium: Left atrial size was mildly dilated. Right Atrium: Right atrial size was normal in size. Pericardium: There is no evidence of pericardial effusion. Mitral Valve: The mitral valve is normal in structure. Mild mitral valve regurgitation. MV peak gradient, 3.8 mmHg. The mean mitral valve gradient is  1.0 mmHg. Tricuspid Valve: The tricuspid valve is normal in  structure. Tricuspid valve regurgitation is mild. Aortic Valve: The aortic valve is tricuspid. Aortic valve regurgitation is not visualized. Mild aortic valve sclerosis is present, with no evidence of aortic valve stenosis. Aortic valve mean gradient measures 3.0 mmHg. Aortic valve peak gradient measures 5.0 mmHg. Aortic valve area, by VTI measures 1.60 cm. Pulmonic Valve: The pulmonic valve was not well visualized. Pulmonic valve regurgitation is trivial. Aorta: The aortic root is normal in size and structure. Venous: The inferior vena cava is normal in size with greater than 50% respiratory variability, suggesting right atrial pressure of 3 mmHg. IAS/Shunts: No atrial level shunt detected by color flow Doppler.  LEFT VENTRICLE PLAX 2D LVIDd:         4.69 cm  Diastology LVIDs:         3.39 cm  LV e' medial:    8.38 cm/s LV PW:         1.12 cm  LV E/e' medial:  8.7 LV IVS:        0.94 cm  LV e' lateral:   13.60 cm/s LVOT diam:     2.00 cm  LV E/e' lateral: 5.3 LV SV:         30 LV SV Index:   17 LVOT Area:     3.14 cm  RIGHT VENTRICLE RV Basal diam:  3.97 cm RV S prime:     8.92 cm/s LEFT ATRIUM             Index       RIGHT ATRIUM           Index LA diam:        4.40 cm 2.55 cm/m  RA Area:     18.50 cm LA Vol (A2C):   47.2 ml 27.39 ml/m RA Volume:   52.30 ml  30.35 ml/m LA Vol (A4C):   60.4 ml 35.05 ml/m LA Biplane Vol: 57.4 ml 33.31 ml/m  AORTIC VALVE                   PULMONIC VALVE AV Area (Vmax):    2.18 cm    PV Vmax:       0.78 m/s AV Area (Vmean):   1.86 cm    PV Vmean:      51.700 cm/s AV Area (VTI):     1.60 cm    PV VTI:        0.100 m AV Vmax:           112.00 cm/s PV Peak grad:  2.4 mmHg AV Vmean:          80.400 cm/s PV Mean grad:  1.0 mmHg AV VTI:            0.187 m AV Peak Grad:      5.0 mmHg AV Mean Grad:      3.0 mmHg LVOT Vmax:         77.70 cm/s LVOT Vmean:        47.500 cm/s LVOT VTI:          0.095 m LVOT/AV VTI ratio: 0.51  AORTA Ao Root diam: 3.40 cm MITRAL VALVE                TRICUSPID VALVE MV Area (PHT): 4.24 cm    TR Peak grad:   19.2 mmHg MV Area VTI:   1.74 cm    TR Vmax:  219.00 cm/s MV Peak grad:  3.8 mmHg MV Mean grad:  1.0 mmHg    SHUNTS MV Vmax:       0.97 m/s    Systemic VTI:  0.10 m MV Vmean:      53.2 cm/s   Systemic Diam: 2.00 cm MV Decel Time: 179 msec MV E velocity: 72.65 cm/s Debbe Odea MD Electronically signed by Debbe Odea MD Signature Date/Time: 01/06/2021/12:51:11 PM    Final         Scheduled Meds:  aspirin  81 mg Oral Daily   cholecalciferol  1,000 Units Oral Weekly   donepezil  10 mg Oral Daily   DULoxetine  60 mg Oral Daily   enoxaparin (LOVENOX) injection  40 mg Subcutaneous Q24H   insulin aspart  0-20 Units Subcutaneous TID WC   insulin aspart  0-5 Units Subcutaneous QHS   LORazepam  0.5 mg Intravenous Once   magnesium oxide  200 mg Oral Daily   metoprolol tartrate  12.5 mg Oral BID   mirtazapine  15 mg Oral QHS   pantoprazole  40 mg Oral Daily   pravastatin  40 mg Oral QHS   QUEtiapine  25 mg Oral BID   sodium chloride flush  3 mL Intravenous Q12H   vitamin B-12  5,000 mcg Oral Daily   Continuous Infusions:  0.9 % NaCl with KCl 20 mEq / L 125 mL/hr at 01/07/21 0222   ceFEPime (MAXIPIME) IV Stopped (01/07/21 0037)   vancomycin 750 mg (01/07/21 0221)    Assessment & Plan:   Principal Problem:   Syncope and collapse Active Problems:   Benign essential HTN   OSA (obstructive sleep apnea)   DM type 2 with diabetic mixed hyperlipidemia (HCC)   Diabetic peripheral neuropathy (HCC)   Atrial fibrillation with rapid ventricular response (HCC)   Dehydration   Hypotension   Hypokalemia   Lactic acidosis   #1 syncope: Most likely secondary to cardiac causes.  Patient does have a history of A. fib.  She has A. fib with RVR here.  Patient however is also dehydrated with hypotension on arrival which could mean orthostasis leading to syncope versus tachy causing hypotension. 9/1-Echo with EF 50 to 55%.  No  regional wall motion abnormalities.  No significant valvular disease. Blood pressure is improved.  Still tachycardic. Cardiology following    #2 A. fib with RVR:  Still tachycardic heart rate mildly better.  Cardiology following.   Check TSH  Plan for rate control only.   Lopressor i25 mg twice daily Change lopressor to Toprol before discharge  Avoid amiodarone given risk of thromboembolic event with pharmacologic conversion  Could consider digoxin if BP limits optimal rate control recent creatinine stable will defer for now  No plan for anticoagulation given frequent falls no plans for DCCV given now therapeutically anticoagulated      #3 hypotension:  Probably due to dehydration and A. fib RVR  Will decrease IV fluid as blood pressure has improved  If need to will add midodrine      #4 lactic acidosis: Sepsis ruled out.  Likely due to dehydration  Improving with IV fluids, decrease rate to 75 MLS per hour     #5 suspected sepsis: -Sepsis ruled out  Blood cultures to date negative  Urine culture with multiple species  WBC improving  Chest x-ray without pneumonia    #6 hypokalemia: Was repleted and stable   #7 diabetes type 2: Hold metformin and use sliding scale insulin.   #  8 obstructive sleep apnea: offered CPAP at night   DVT prophylaxis: Lovenox Code Status: Full Family Communication: None at bedside Disposition Plan:  Status is: Inpatient  Remains inpatient appropriate because:Inpatient level of care appropriate due to severity of illness  Dispo: The patient is from: Home              Anticipated d/c is to: TBD              Patient currently is not medically stable to d/c.   Difficult to place patient No            LOS: 1 day   Time spent: 35 minutes with more than 50% on COC    Lynn Ito, MD [facility] Pager [phone]  If 7PM-7AM, please contact night-coverage 01/07/2021, 8:15 AM

## 2021-01-07 NOTE — Evaluation (Signed)
Occupational Therapy Evaluation Patient Details Name: Loretta Webb MRN: [id number] DOB: 1931/07/09 Today's Date: 01/07/2021    History of Present Illness Loretta Webb is a 87 y.o. female with medical history significant of atrial fibrillation, chronic pain syndrome, osteoarthritis, diabetes type 2, GERD, fibromyalgia, history of esophageal stricture, essential hypertension and obstructive sleep apnea who was brought in from home after a witnessed syncopal episode this morning.   Clinical Impression   [name] was seen for OT evaluation this date. Prior to hospital admission, pt was Independent for mobility and I/ADLs including driving and limited community mobility, pt reports 2 falls in last week. Pt lives alone in 2 level home, chair lift to second level and family available PRN. Pt presents to acute OT demonstrating impaired ADL performance and functional mobility 2/2 decreased activity tolerance and functional strength/balance deficits.   Pt currently requires MIN A + HHA for toilet t/f. MAX A perihygiene in standing. SETUP + SUPERVISION don/doff socks seated EOB. MIN A for grooming standing sinkside. Pt reports 5/10 fatigue following <10 min OOB ADLs. Pt would benefit from skilled OT to address noted impairments and functional limitations (see below for any additional details) in order to maximize safety and independence while minimizing falls risk and caregiver burden. Upon hospital discharge, recommend STR to maximize pt safety and return to PLOF.  Orthostatic Vitals  Lying: BP 117/87, MAP 96, HR 95 Sitting: BP 114/88, MAP 92, HR 115 Standing: BP 118/78, MAP 91, HR 111     Follow Up Recommendations  SNF    Equipment Recommendations  Other (comment) (TBD at next venue of care)    Recommendations for Other Services       Precautions / Restrictions Precautions Precautions: Fall Restrictions Weight Bearing Restrictions: No      Mobility Bed Mobility Overal bed mobility:  Needs Assistance Bed Mobility: Supine to Sit;Sit to Supine     Supine to sit: Min assist Sit to supine: Min assist        Transfers Overall transfer level: Needs assistance Equipment used: 1 person hand held assist Transfers: Sit to/from Stand Sit to Stand: Min assist              Balance Overall balance assessment: Needs assistance Sitting-balance support: No upper extremity supported;Feet supported Sitting balance-Leahy Scale: Good     Standing balance support: Single extremity supported;During functional activity Standing balance-Leahy Scale: Fair                             ADL either performed or assessed with clinical judgement   ADL Overall ADL's : Needs assistance/impaired                                       General ADL Comments: MIN A + HHA for toilet t/f. MAX A perihygiene in standing. SETUP + SUPERVISION don/doff socks seated EOB. MIN A for grooming standing sinkside.                  Pertinent Vitals/Pain Pain Assessment: No/denies pain     Hand Dominance Right   Extremity/Trunk Assessment Upper Extremity Assessment Upper Extremity Assessment: RUE deficits/detail;LUE deficits/detail RUE Deficits / Details: hx of shoulder ROM limitations LUE Deficits / Details: hx of shoulder ROM limitations   Lower Extremity Assessment Lower Extremity Assessment: Generalized weakness       Communication  Communication Communication: HOH   Cognition Arousal/Alertness: Awake/alert Behavior During Therapy: WFL for tasks assessed/performed Overall Cognitive Status: Within Functional Limits for tasks assessed                                 General Comments: A&O x4, increased time for command following likely r/t hard of hearing   General Comments       Exercises Exercises: Other exercises Other Exercises Other Exercises: Pt and family educated re: OT role, DME recs, d/c recs, falls prevention, ECS Other  Exercises: LBD< grooming, toileting, sup<>sit, sit<>stand, sitting/standing blaance/tolerance   Shoulder Instructions      Home Living Family/patient expects to be discharged to:: Private residence Living Arrangements: Alone Available Help at Discharge: Family;Available PRN/intermittently Type of Home: House Home Access: Stairs to enter [employer] of Steps: unclear steps in from garage - chair lift from basement to main floor   Home Layout: Two level;Laundry or work area in [employer] Equipment: Environmental consultant - 2 wheels;Cane - single point          Prior Functioning/Environment Level of Independence: Independent with assistive device(s)        Comments: Pt drives and uses SPC for limited community mobility.        OT Problem List: Decreased strength;Decreased range of motion;Decreased activity tolerance;Impaired balance (sitting and/or standing);Decreased safety awareness      OT Treatment/Interventions: Self-care/ADL training;Therapeutic exercise;Energy conservation;DME and/or AE instruction;Therapeutic activities;Patient/family education;Balance training    OT Goals(Current goals can be found in the care plan section) Acute Rehab OT Goals Patient Stated Goal: to go to rehab OT Goal Formulation: With patient/family Time For Goal Achievement: 01/21/21 Potential to Achieve Goals: Good ADL Goals Pt Will Perform Grooming: with modified independence;standing (c LRAD PRN) Pt Will Perform Lower Body Dressing: with modified independence;sit to/from stand (c LRAD PRN) Pt Will Transfer to Toilet: with modified independence;ambulating;regular height toilet (c LRAD PRN)  OT Frequency: Min 1X/week   Barriers to D/C: Decreased caregiver support          Co-evaluation              AM-PAC OT "6 Clicks" Daily Activity     Outcome Measure Help from another person eating meals?: A Little Help from another person taking care of personal grooming?:  A Little Help from another person toileting, which includes using toliet, bedpan, or urinal?: A Lot Help from another person bathing (including washing, rinsing, drying)?: A Little Help from another person to put on and taking off regular upper body clothing?: A Little Help from another person to put on and taking off regular lower body clothing?: A Little 6 Click Score: 17   End of Session Nurse Communication: Mobility status  Activity Tolerance: Patient tolerated treatment well Patient left: in bed;with call bell/phone within reach;with bed alarm set;with family/visitor present  OT Visit Diagnosis: Other abnormalities of gait and mobility (R26.89);Muscle weakness (generalized) (M62.81)                Time: 8341-9622 OT Time Calculation (min): 28 min Charges:  OT General Charges $OT Visit: 1 Visit OT Evaluation $OT Eval Moderate Complexity: 1 Mod OT Treatments $Self Care/Home Management : 8-22 mins  Kathie Dike, M.S. OTR/L  01/07/21, 4:47 PM  ascom [phone]

## 2021-01-08 LAB — GLUCOSE, CAPILLARY
Glucose-Capillary: 119 mg/dL — ABNORMAL HIGH (ref 70–99)
Glucose-Capillary: 126 mg/dL — ABNORMAL HIGH (ref 70–99)
Glucose-Capillary: 71 mg/dL (ref 70–99)
Glucose-Capillary: 172 mg/dL — ABNORMAL HIGH (ref 70–99)
Glucose-Capillary: 144 mg/dL — ABNORMAL HIGH (ref 70–99)

## 2021-01-08 LAB — LACTIC ACID, PLASMA: Lactic Acid, Venous: 1.3 mmol/L (ref 0.5–1.9)

## 2021-01-08 NOTE — Care Management Important Message (Signed)
Important Message  Patient Details  Name: Loretta Webb MRN: [id number] Date of Birth: 1932-05-02   Medicare Important Message Given:  Yes     Johnell Comings 01/08/2021, 11:16 AM

## 2021-01-08 NOTE — Progress Notes (Signed)
Progress Note  Patient Name: Loretta Webb Date of Encounter: 01/08/2021  Primary Cardiologist:Dr. Agbor-Etang  Subjective   No current chest pain, feelings of racing heart rate, palpitations, presyncope.   Earlier report of SOB with ambulation per PT. Pt hard of hearing, tired / somnolent.   Inpatient Medications    Scheduled Meds:  aspirin  81 mg Oral Daily   cholecalciferol  1,000 Units Oral Weekly   digoxin  0.125 mg Oral Daily   donepezil  10 mg Oral Daily   DULoxetine  60 mg Oral Daily   enoxaparin (LOVENOX) injection  40 mg Subcutaneous Q24H   insulin aspart  0-20 Units Subcutaneous TID WC   insulin aspart  0-5 Units Subcutaneous QHS   magnesium oxide  200 mg Oral Daily   metoprolol tartrate  25 mg Oral BID   midodrine  2.5 mg Oral TID WC   mirtazapine  15 mg Oral QHS   pantoprazole  40 mg Oral Daily   pravastatin  40 mg Oral QHS   QUEtiapine  25 mg Oral BID   sodium chloride flush  3 mL Intravenous Q12H   vitamin B-12  5,000 mcg Oral Daily   Continuous Infusions:   PRN Meds: acetaminophen, ondansetron **OR** ondansetron (ZOFRAN) IV, sodium chloride   Vital Signs    Vitals:   01/08/21 0500 01/08/21 0641 01/08/21 0802 01/08/21 1203  BP:  (!) 151/91 124/82 113/78  Pulse:  (!) 104 99 94  Resp:  20 17   Temp:  98 F (36.7 C) (!) 97.4 F (36.3 C) 98.6 F (37 C)  TempSrc:  Oral Oral Oral  SpO2:  97% 94% 95%  Weight: 69.2 kg     Height:        Intake/Output Summary (Last 24 hours) at 01/08/2021 1512 Last data filed at 01/08/2021 1300 Gross per 24 hour  Intake 600 ml  Output 250 ml  Net 350 ml    Last 3 Weights 01/08/2021 01/07/2021 01/06/2021  Weight (lbs) 152 lb 8.9 oz 158 lb 11.7 oz 152 lb 8.9 oz  Weight (kg) 69.2 kg 72 kg 69.2 kg      Telemetry    Afib  with ventricular rates 90-100s and elevation into the 110 or 120s with activity- Personally Reviewed  ECG    No new tracings- Personally Reviewed  Physical Exam   GEN: No acute distress.   Facial bruising noted, as well as bruising along the left lower extremity.  Hard of hearing.  Somnolent. Neck: No JVD.  No carotid bruits. Cardiac: IRIR and tachycardic, 1/6 systolic murmur.  No rubs, or gallops.  Respiratory: Clear to auscultation bilaterally. GI: Soft, nontender, non-distended  MS: No edema with left lower extremity showing tibial bruising; No deformity. Neuro:  Nonfocal  Psych: Normal affect   Labs    High Sensitivity Troponin:  No results for input(s): TROPONINIHS in the last 720 hours.    Chemistry Recent Labs  Lab 01/05/21 1947 01/06/21 0100 01/07/21 0414  NA 136 136  --   K 3.4* 3.6  --   CL 100 101  --   CO2 24 26  --   GLUCOSE 216* 112*  --   BUN 23 21  --   CREATININE 1.23* 1.06* 0.96  CALCIUM 8.8* 9.0  --   PROT 6.2* 6.7  --   ALBUMIN 3.6 3.8  --   AST 29 27  --   ALT 12 11  --   ALKPHOS 41 43  --  BILITOT 0.6 0.6  --   GFRNONAA 43* 51* 57*  ANIONGAP 12 9  --       Hematology Recent Labs  Lab 01/05/21 1947 01/06/21 0100 01/07/21 0414  WBC 11.7* 13.2* 10.8*  RBC 4.24 4.33 4.02  HGB 10.8* 11.2* 10.3*  HCT 33.8* 34.2* 32.7*  MCV 79.7* 79.0* 81.3  MCH 25.5* 25.9* 25.6*  MCHC 32.0 32.7 31.5  RDW 18.6* 18.4* 18.7*  PLT 344 353 317     BNPNo results for input(s): BNP, PROBNP in the last 168 hours.   DDimer No results for input(s): DDIMER in the last 168 hours.   Radiology    No results found.  Cardiac Studies   Echo 01/06/21  1. Left ventricular ejection fraction, by estimation, is 50 to 55%. The  left ventricle has low normal function. The left ventricle has no regional  wall motion abnormalities. Left ventricular diastolic parameters are  indeterminate.   2. Right ventricular systolic function is normal. The right ventricular  size is normal.   3. Left atrial size was mildly dilated.   4. The mitral valve is normal in structure. Mild mitral valve  regurgitation.   5. The aortic valve is tricuspid. Aortic valve  regurgitation is not  visualized. Mild aortic valve sclerosis is present, with no evidence of  aortic valve stenosis.   6. The inferior vena cava is normal in size with greater than 50%  respiratory variability, suggesting right atrial pressure of 3 mmHg.   Patient Profile     87 y.o. female with history of atrial fibrillation, hypertension, DM2, frequent falls with walker and cane/lives alone at home, and seen today for the evaluation of atrial fibrillation s/p recent fall with LOC per daughter.  Assessment & Plan    Permanent Atrial fibrillation with RVR --Asymptomatic in atrial fibrillation.  Known Afib.  Echo with EF 50-55% and NRWMA. --Daily BMET. TSH wnl.  --Plan for rate control only.   Continue lopressor, digoxin for rate control. Check digoxin level and consolidate Lopressor to Toprol before discharge. Avoid amiodarone given risk of thromboembolic event with pharmacologic conversion.   --No plan for anticoagulation given frequent falls.   CHA2DS2VASc score of at least 5 (HTN, age x2, DM2, female)l; however, risks of anticoagulation outweigh those of benefits at this time given anemia and frequent falls. --No plan for DCCV given not therapeutically anticoagulated.  History of frequent falls Recent fall with LOC --Severely weakened, deconditioned.  Echo showed only mild MR and MR sclerosis.  Could consider updating previous carotid study, given recent fall with LOC.  Head CT without acute findings. Defer to IM regarding CT to rule out PE.  No asymmetric swelling of LE.  Given LOC, considered was outpatient cardiac monitoring with Zio AT x2 weeks; however, given pt memory issues and comorbids, she will likely not tolerate this monitoring. Avoid sedating medications at discharge, consider discharge to rehab facility as suspect high risk of recurrent falls if continues to live at home. Would not recommend she continue to live at home. Replete K+/Mg.  Monitor anemia. Recommend PT/OT during  admission and as an outpatient, given her history of falls and deconditioning.  Hypokalemia/hypomagnesia --Replete K+ with goal 4.0. Mg with goal 2.0. --Will order BMET/Mg for today.  Anemia of unknown etiology --Anemia of unknown etiology.  Further work-up per IM.  Denies any signs or symptoms of bleeding.  Daily CBC.  No plan for anticoagulation as above.  Sepsis --Per IM.  Essential hypertension -- BP well  controlled - continue Lopressor.      For questions or updates, please contact [facility] Please consult [email or web address] for contact info under        Signed, Lennon Alstrom, PA-C  01/08/2021, 3:12 PM

## 2021-01-08 NOTE — NC FL2 (Signed)
[place] MEDICAID FL2 LEVEL OF CARE SCREENING TOOL     IDENTIFICATION  Patient Name: Loretta Webb Birthdate: 12-Feb-1932 Sex: female Admission Date (Current Location): 01/05/2021  [facility] and [place] Number:  Chiropodist and Address:  [facility], [address], [place], [place] [address]      Provider Number: [id number]  Attending Physician Name and Address:  Lynn Ito, MD  Relative Name and Phone Number:  Arbutus Leas (Daughter)   [phone] [facility] Phone)    Current Level of Care: Hospital Recommended Level of Care: Skilled Nursing Facility Prior Approval Number:    Date Approved/Denied:   PASRR Number: [id number]  Discharge Plan:      Current Diagnoses: Patient Active Problem List   Diagnosis Date Noted   Atrial fibrillation with rapid ventricular response (HCC) 01/06/2021   Dehydration 01/06/2021   Hypotension 01/06/2021   Hypokalemia 01/06/2021   Lactic acidosis 01/06/2021   Syncope and collapse 01/05/2021   SI joint arthritis 06/24/2020   Chronic radicular lumbar pain 06/04/2020   Primary osteoarthritis of right hip 02/24/2020   Primary osteoarthritis of left hip 02/24/2020   Localized osteoarthritis of shoulder regions, bilateral 01/20/2020   History of rotator cuff syndrome (bilateral) 01/20/2020   Right hip pain 09/03/2019   Chronic low back pain (1ry area of Pain) (Bilateral) (R>L) w/o sciatica 09/03/2019   Chronic shoulder pain (3ry area of Pain) (Bilateral) (L>R) 09/03/2019   Chronic lower extremity pain (4th area of Pain) (Bilateral) (R>L) 09/03/2019   Diabetic peripheral neuropathy (HCC) 09/03/2019   Neurogenic pain 09/03/2019   Lumbar facet syndrome (Bilateral) (R>L) 09/03/2019   Lumbar facet hypertrophy 09/03/2019   DDD (degenerative disc disease), lumbosacral 09/03/2019   Grade 1 Anterolisthesis of L3/L4 09/03/2019   Chronic pain syndrome 09/02/2019   Pharmacologic therapy 09/02/2019   Disorder of  skeletal system 09/02/2019   Problems influencing health status 09/02/2019   Abnormal MRI, lumbar spine (01/13/2019) 09/02/2019   Abnormal MRI, cervical spine (2014) 09/02/2019   DM type 2 with diabetic mixed hyperlipidemia (HCC) 12/04/2018   Low serum vitamin D 05/29/2018   Nocturnal hypoxia 05/04/2016   Hyperlipidemia, mixed 10/28/2015   Cephalalgia 07/24/2015   Neuritis or radiculitis due to rupture of lumbar intervertebral disc 01/05/2015   DDD (degenerative disc disease), cervical 04/14/2014   B12 deficiency 12/23/2013   Benign essential HTN 12/23/2013   Type 2 diabetes mellitus (HCC) 12/23/2013   OSA (obstructive sleep apnea) 12/23/2013   Lumbar canal stenosis 09/18/2013   Lumbar central spinal stenosis w/ neurogenic claudication 09/18/2013    Orientation RESPIRATION BLADDER Height & Weight     Self, Time, Situation, Place  Normal Continent Weight: 152 lb 8.9 oz (69.2 kg) Height:  5\' 3"  (160 cm)  BEHAVIORAL SYMPTOMS/MOOD NEUROLOGICAL BOWEL NUTRITION STATUS      Continent Diet (heart healthy/carb modified; thin liquids)  AMBULATORY STATUS COMMUNICATION OF NEEDS Skin   Limited Assist Verbally Bruising                       Personal Care Assistance Level of Assistance  Bathing, Feeding, Dressing Bathing Assistance: Limited assistance Feeding assistance: Independent Dressing Assistance: Limited assistance     Functional Limitations Info             SPECIAL CARE FACTORS FREQUENCY  PT (By licensed PT), OT (By licensed OT)     PT Frequency: 5 x/week OT Frequency: 5 x/week  Contractures      Additional Factors Info  Code Status, Allergies Code Status Info: full Allergies Info: nka           Current Medications (01/08/2021):  This is the current hospital active medication list Current Facility-Administered Medications  Medication Dose Route Frequency Provider Last Rate Last Admin   acetaminophen (TYLENOL) tablet 650 mg  650 mg Oral Q6H PRN  Lynn Ito, MD   650 mg at 01/08/21 7124   aspirin chewable tablet 81 mg  81 mg Oral Daily Manuela Schwartz, NP   81 mg at 01/08/21 5809   cholecalciferol (VITAMIN D3) tablet 1,000 Units  1,000 Units Oral Weekly Manuela Schwartz, NP   1,000 Units at 01/06/21 0825   digoxin (LANOXIN) tablet 0.125 mg  0.125 mg Oral Daily Antonieta Iba, MD   0.125 mg at 01/08/21 0819   donepezil (ARICEPT) tablet 10 mg  10 mg Oral Daily Manuela Schwartz, NP   10 mg at 01/08/21 0816   DULoxetine (CYMBALTA) DR capsule 60 mg  60 mg Oral Daily Manuela Schwartz, NP   60 mg at 01/08/21 0816   enoxaparin (LOVENOX) injection 40 mg  40 mg Subcutaneous Q24H Earlie Lou L, MD   40 mg at 01/08/21 0818   insulin aspart (novoLOG) injection 0-20 Units  0-20 Units Subcutaneous TID WC Earlie Lou L, MD   3 Units at 01/08/21 0820   insulin aspart (novoLOG) injection 0-5 Units  0-5 Units Subcutaneous QHS Earlie Lou L, MD       magnesium oxide (MAG-OX) tablet 200 mg  200 mg Oral Daily Manuela Schwartz, NP   200 mg at 01/08/21 0817   metoprolol tartrate (LOPRESSOR) tablet 25 mg  25 mg Oral BID Marisue Ivan D, PA-C   25 mg at 01/08/21 0816   midodrine (PROAMATINE) tablet 2.5 mg  2.5 mg Oral TID WC Lynn Ito, MD   2.5 mg at 01/08/21 1225   mirtazapine (REMERON) tablet 15 mg  15 mg Oral QHS Manuela Schwartz, NP   15 mg at 01/07/21 2111   ondansetron (ZOFRAN) tablet 4 mg  4 mg Oral Q6H PRN Rometta Emery, MD       Or   ondansetron (ZOFRAN) injection 4 mg  4 mg Intravenous Q6H PRN Rometta Emery, MD       pantoprazole (PROTONIX) EC tablet 40 mg  40 mg Oral Daily Manuela Schwartz, NP   40 mg at 01/08/21 0816   pravastatin (PRAVACHOL) tablet 40 mg  40 mg Oral QHS Manuela Schwartz, NP   40 mg at 01/07/21 2110   QUEtiapine (SEROQUEL) tablet 25 mg  25 mg Oral BID Manuela Schwartz, NP   25 mg at 01/08/21 0817   sodium chloride (OCEAN) 0.65 % nasal spray 1 spray  1 spray Each Nare PRN Manuela Schwartz, NP   1 spray at  01/06/21 0317   sodium chloride flush (NS) 0.9 % injection 3 mL  3 mL Intravenous Q12H Rometta Emery, MD   3 mL at 01/08/21 9833   vitamin B-12 (CYANOCOBALAMIN) tablet 5,000 mcg  5,000 mcg Oral Daily Manuela Schwartz, NP   5,000 mcg at 01/08/21 8250     Discharge Medications: Please see discharge summary for a list of discharge medications.  Relevant Imaging Results:  Relevant Lab Results:   Additional Information SS #: [id number]  Marley Charlot E Patrizia Paule, LCSW

## 2021-01-08 NOTE — Evaluation (Signed)
Physical Therapy Evaluation Patient Details Name: Loretta Webb MRN: [id number] DOB: [birth year] Today's Date: 01/08/2021   History of Present Illness  Tillie ROSELINA BURGUENO is a 87 y.o. female with medical history significant of atrial fibrillation, chronic pain syndrome, osteoarthritis, diabetes type 2, GERD, fibromyalgia, history of esophageal stricture, essential hypertension and obstructive sleep apnea who was brought in from home after a witnessed syncopal episode.   Clinical Impression  Pt admitted with above diagnosis. Pt received sitting upright in bed agreeable to PT services. Pt denies any pain and that she is overall feeling better. Resting HR 97 BPM and RR at 19 with noted a-fib HR trending upper 90's upwards to 110. Supervision with HOB elevated to sit EOB. Pt reports limited use of BUE's due to known shoulder impairments which she was receiving PT for prior to admission. Able to don socks in sitting on L foot but requires assist on R foot due to shoulder pain. Minguard required to stand and amb in room with VC's for RW sequencing and hand placement. Pt amb very slowly and cautious with minimal step through patternw ith RW far outside pt's BOS. Required mod VC's to bring RW closer to BOS to reduce risk of falls. Pt limited with SOB and fatigue thus returning to EOB. HR trended in mid 100's up to 117 BPM with ambulation with RR up to 36. SPO2 > 90% throughout. Pt reports she is ambulating slower and is deconditioned as she typically is able to perform short community distances with her SPC. Will benefit from STR to optimize strength and endurance with OOB ADL's prior to transitioning to home. Pt currently with functional limitations due to the deficits listed below (see PT Problem List). Pt will benefit from skilled PT to increase their independence and safety with mobility to allow discharge to the venue listed below.      Follow Up Recommendations SNF    Equipment Recommendations  Other  (comment) (defer to next venue of care)    Recommendations for Other Services       Precautions / Restrictions Precautions Precautions: Fall Restrictions Weight Bearing Restrictions: No      Mobility  Bed Mobility Overal bed mobility: Needs Assistance Bed Mobility: Supine to Sit;Sit to Supine     Supine to sit: Supervision;HOB elevated Sit to supine: Supervision;HOB elevated   General bed mobility comments: Required modA+1 to scoot to [facility]    Transfers Overall transfer level: Needs assistance Equipment used: Rolling walker (2 wheeled) Transfers: Sit to/from Stand Sit to Stand: Min guard;From elevated surface            Ambulation/Gait Ambulation/Gait assistance: Min guard Gait Distance (Feet): 20 Feet Assistive device: Rolling walker (2 wheeled) Gait Pattern/deviations: Step-through pattern;Drifts right/left Gait velocity: decreased from baseline per pt report   General Gait Details: Limited by fatigue, overall stable. RR up to 36 with short distance in room.  Stairs            Wheelchair Mobility    Modified Rankin (Stroke Patients Only)       Balance Overall balance assessment: Needs assistance Sitting-balance support: No upper extremity supported;Feet supported Sitting balance-Leahy Scale: Good     Standing balance support: Bilateral upper extremity supported;During functional activity Standing balance-Leahy Scale: Fair Standing balance comment: Able to stand with light touch on RW without physical PT assist.  Pertinent Vitals/Pain Pain Assessment: No/denies pain    Home Living Family/patient expects to be discharged to:: Private residence Living Arrangements: Alone Available Help at Discharge: Family;Available PRN/intermittently Type of Home: House Home Access: Stairs to enter   [employer] of Steps: level entrance from garage per pt. Does report chair lift from basement to main  floor. Home Layout: Two level;Laundry or work area in [employer] Equipment: Environmental consultant - 2 wheels;Cane - single point      Prior Function Level of Independence: Independent with assistive device(s)         Comments: Pt drives and uses SPC for limited community mobility.     Hand Dominance   Dominant Hand: Right    Extremity/Trunk Assessment   Upper Extremity Assessment RUE Deficits / Details: hx of shoulder ROM limitations LUE Deficits / Details: hx of shoulder ROM limitations    Lower Extremity Assessment Lower Extremity Assessment: Generalized weakness    Cervical / Trunk Assessment Cervical / Trunk Assessment: Normal  Communication   Communication: HOH  Cognition Arousal/Alertness: Awake/alert Behavior During Therapy: WFL for tasks assessed/performed Overall Cognitive Status: Within Functional Limits for tasks assessed                                 General Comments: Does require increased time to complete due to being [facility].      General Comments      Exercises General Exercises - Lower Extremity Ankle Circles/Pumps: AROM;Supine;Both;10 reps Hip Flexion/Marching: AROM;Both;10 reps;Standing Other Exercises Other Exercises: Pt educated on role of PT in acute setting. D/c recs   Assessment/Plan    PT Assessment Patient needs continued PT services  PT Problem List Decreased strength;Decreased safety awareness;Decreased activity tolerance       PT Treatment Interventions DME instruction;Therapeutic exercise;Gait training;Balance training;Stair training;Neuromuscular re-education;Functional mobility training;Therapeutic activities;Patient/family education    PT Goals (Current goals can be found in the Care Plan section)  Acute Rehab PT Goals Patient Stated Goal: to go to rehab PT Goal Formulation: With patient Time For Goal Achievement: 01/22/21 Potential to Achieve Goals: Good    Frequency Min 2X/week   Barriers to discharge   Limited  family support at home    Co-evaluation               AM-PAC PT "6 Clicks" Mobility  Outcome Measure Help needed turning from your back to your side while in a flat bed without using bedrails?: A Lot Help needed moving from lying on your back to sitting on the side of a flat bed without using bedrails?: A Little Help needed moving to and from a bed to a chair (including a wheelchair)?: A Little Help needed standing up from a chair using your arms (e.g., wheelchair or bedside chair)?: A Little Help needed to walk in hospital room?: A Little Help needed climbing 3-5 steps with a railing? : A Lot 6 Click Score: 16    End of Session Equipment Utilized During Treatment: Gait belt Activity Tolerance: Patient tolerated treatment well;Patient limited by fatigue Patient left: in bed;with call bell/phone within reach;with bed alarm set Nurse Communication: Mobility status PT Visit Diagnosis: Muscle weakness (generalized) (M62.81)    Time: 3716-9678 PT Time Calculation (min) (ACUTE ONLY): 25 min   Charges:   PT Evaluation $PT Eval Moderate Complexity: 1 Mod PT Treatments $Gait Training: 23-37 mins        Delphia Grates. Fairly IV, PT, DPT Physical Therapist-  [facility]  [facility]  01/08/2021, 10:12 AM

## 2021-01-08 NOTE — Progress Notes (Signed)
PROGRESS NOTE    Loretta Webb  VCB:[id number] DOB: [birth year] DOA: 01/05/2021 PCP: [name], MD    Brief Narrative:   Loretta Webb is a 87 y.o. female with medical history significant of atrial fibrillation, chronic pain syndrome, osteoarthritis, diabetes type 2, GERD, fibromyalgia, history of esophageal stricture, essential hypertension and obstructive sleep apnea who was brought in from home after a witnessed syncopal episode this morning.  She had physical therapy at the time.  Did not come to the ER.  In the afternoon she had another 1.  Patient was evaluated by EMS where she was found to be in A. fib with RVR.  Rate it was in the 180s at the time.  She received 10 L of metoprolol.  Patient was noted to have oxygen sats of 92% on room air and was placed on 2 L of oxygen.  Brought into the ER where she was found to be hypotensive.  Patient is systolic blood pressure was in the 90 and diastolic 57.  Her heart rate was 110 on arrival.  She did have some bruises on lower and upper extremities some skin abrasions.  She is slightly confused but has baseline dementia although mild.  She was noted to also have some hypothermia, A. fib with RVR as well as leukocytosis.  Patient has lactic acidosis with suspicion for possible sepsis but source is unknown.  Empirically started on vancomycin and cefepime in the ER and being admitted to the hospital for evaluation of syncope..    9/1- felt anxious earlier per nsg, was given ativan, now she reports feeling better.  As Telemetry currently not functioning made nursing aware. 9/2 walk with PT felt little short of breath.  Heart rate in 110s but overall getting better   Consultants:  Cardiology  Procedures:   Antimicrobials:      Subjective: No dizziness, chest pain, lightheadedness  Objective: Vitals:   01/08/21 0500 01/08/21 0641 01/08/21 0802 01/08/21 1203  BP:  (!) 151/91 124/82 113/78  Pulse:  (!) 104 99 94  Resp:  20 17   Temp:   98 F (36.7 C) (!) 97.4 F (36.3 C) 98.6 F (37 C)  TempSrc:  Oral Oral Oral  SpO2:  97% 94% 95%  Weight: 69.2 kg     Height:        Intake/Output Summary (Last 24 hours) at 01/08/2021 1439 Last data filed at 01/08/2021 1300 Gross per 24 hour  Intake 600 ml  Output 250 ml  Net 350 ml   Filed Weights   01/06/21 0401 01/07/21 0440 01/08/21 0500  Weight: 69.2 kg 72 kg 69.2 kg    Examination:  NAD, calm CTA no wheeze rales rhonchi's Irregular S1-S2 no gallops Soft benign positive bowel sounds No edema Awake and alert Grossly intact    Data Reviewed: I have personally reviewed following labs and imaging studies  CBC: Recent Labs  Lab 01/05/21 1947 01/06/21 0100 01/07/21 0414  WBC 11.7* 13.2* 10.8*  NEUTROABS 9.0*  --   --   HGB 10.8* 11.2* 10.3*  HCT 33.8* 34.2* 32.7*  MCV 79.7* 79.0* 81.3  PLT 344 353 317   Basic Metabolic Panel: Recent Labs  Lab 01/05/21 1947 01/06/21 0100 01/07/21 0414  NA 136 136  --   K 3.4* 3.6  --   CL 100 101  --   CO2 24 26  --   GLUCOSE 216* 112*  --   BUN 23 21  --   CREATININE  1.23* 1.06* 0.96  CALCIUM 8.8* 9.0  --   MG  --  2.4  --    GFR: Estimated Creatinine Clearance: 38.5 mL/min (by C-G formula based on SCr of 0.96 mg/dL). Liver Function Tests: Recent Labs  Lab 01/05/21 1947 01/06/21 0100  AST 29 27  ALT 12 11  ALKPHOS 41 43  BILITOT 0.6 0.6  PROT 6.2* 6.7  ALBUMIN 3.6 3.8   No results for input(s): LIPASE, AMYLASE in the last 168 hours. No results for input(s): AMMONIA in the last 168 hours. Coagulation Profile: Recent Labs  Lab 01/05/21 1947  INR 1.1   Cardiac Enzymes: No results for input(s): CKTOTAL, CKMB, CKMBINDEX, TROPONINI in the last 168 hours. BNP (last 3 results) No results for input(s): PROBNP in the last 8760 hours. HbA1C: Recent Labs    01/06/21 0100  HGBA1C 6.6*   CBG: Recent Labs  Lab 01/07/21 1641 01/07/21 2004 01/08/21 0355 01/08/21 0807 01/08/21 1213  GLUCAP 128* 117*  119* 126* 71   Lipid Profile: No results for input(s): CHOL, HDL, LDLCALC, TRIG, CHOLHDL, LDLDIRECT in the last 72 hours. Thyroid Function Tests: Recent Labs    01/07/21 0414  TSH 3.528   Anemia Panel: No results for input(s): VITAMINB12, FOLATE, FERRITIN, TIBC, IRON, RETICCTPCT in the last 72 hours. Sepsis Labs: Recent Labs  Lab 01/05/21 1947 01/05/21 2030 01/07/21 0840 01/08/21 0810  LATICACIDVEN 3.7* 2.6* 2.3* 1.3    Recent Results (from the past 240 hour(s))  Resp Panel by RT-PCR (Flu A&B, Covid) Nasopharyngeal Swab     Status: None   Collection Time: 01/05/21  7:47 PM   Specimen: Nasopharyngeal Swab; Nasopharyngeal(NP) swabs in vial transport medium  Result Value Ref Range Status   SARS Coronavirus 2 by RT PCR NEGATIVE NEGATIVE Final    Comment: (NOTE) SARS-CoV-2 target nucleic acids are NOT DETECTED.  The SARS-CoV-2 RNA is generally detectable in upper respiratory specimens during the acute phase of infection. The lowest concentration of SARS-CoV-2 viral copies this assay can detect is 138 copies/mL. A negative result does not preclude SARS-Cov-2 infection and should not be used as the sole basis for treatment or other patient management decisions. A negative result may occur with  improper specimen collection/handling, submission of specimen other than nasopharyngeal swab, presence of viral mutation(s) within the areas targeted by this assay, and inadequate number of viral copies(<138 copies/mL). A negative result must be combined with clinical observations, patient history, and epidemiological information. The expected result is Negative.  Fact Sheet for Patients:  BloggerCourse.com  Fact Sheet for Healthcare Providers:  SeriousBroker.it  This test is no t yet approved or cleared by the [place] FDA and  has been authorized for detection and/or diagnosis of SARS-CoV-2 by FDA under an Emergency Use  Authorization (EUA). This EUA will remain  in effect (meaning this test can be used) for the duration of the COVID-19 declaration under Section 564(b)(1) of the Act, 21 U.S.C.section 360bbb-3(b)(1), unless the authorization is terminated  or revoked sooner.       Influenza A by PCR NEGATIVE NEGATIVE Final   Influenza B by PCR NEGATIVE NEGATIVE Final    Comment: (NOTE) The Xpert Xpress SARS-CoV-2/FLU/RSV plus assay is intended as an aid in the diagnosis of influenza from Nasopharyngeal swab specimens and should not be used as a sole basis for treatment. Nasal washings and aspirates are unacceptable for Xpert Xpress SARS-CoV-2/FLU/RSV testing.  Fact Sheet for Patients: BloggerCourse.com  Fact Sheet for Healthcare Providers: SeriousBroker.it  This test is  not yet approved or cleared by the [place] and has been authorized for detection and/or diagnosis of SARS-CoV-2 by FDA under an Emergency Use Authorization (EUA). This EUA will remain in effect (meaning this test can be used) for the duration of the COVID-19 declaration under Section 564(b)(1) of the Act, 21 U.S.C. section 360bbb-3(b)(1), unless the authorization is terminated or revoked.  Performed at [facility], [address], [place], [place] [address]   Urine Culture     Status: Abnormal   Collection Time: 01/05/21  8:30 PM   Specimen: Urine, Random  Result Value Ref Range Status   Specimen Description   Final    URINE, RANDOM Performed at [facility], [address] [place], [place] [address]    Special Requests   Final    NONE Performed at [facility], [address] [place], [place] [address]    Culture MULTIPLE SPECIES PRESENT, SUGGEST RECOLLECTION (A)  Final   Report Status 01/07/2021 FINAL  Final  Blood Culture (routine x 2)     Status: None (Preliminary result)   Collection Time: 01/05/21  8:31 PM   Specimen:  BLOOD  Result Value Ref Range Status   Specimen Description BLOOD LEFT ASSIST CONTROL  Final   Special Requests   Final    BOTTLES DRAWN AEROBIC AND ANAEROBIC Blood Culture results may not be optimal due to an inadequate volume of blood received in culture bottles   Culture   Final    NO GROWTH 3 DAYS Performed at [facility], [address] [place], [place] [address]    Report Status PENDING  Incomplete  Blood Culture (routine x 2)     Status: None (Preliminary result)   Collection Time: 01/05/21  8:31 PM   Specimen: BLOOD  Result Value Ref Range Status   Specimen Description BLOOD RIGHT HAND  Final   Special Requests   Final    BOTTLES DRAWN AEROBIC AND ANAEROBIC Blood Culture adequate volume   Culture   Final    NO GROWTH 3 DAYS Performed at [facility], [address], [place], [place] [address]    Report Status PENDING  Incomplete         Radiology Studies: No results found.      Scheduled Meds:  aspirin  81 mg Oral Daily   cholecalciferol  1,000 Units Oral Weekly   digoxin  0.125 mg Oral Daily   donepezil  10 mg Oral Daily   DULoxetine  60 mg Oral Daily   enoxaparin (LOVENOX) injection  40 mg Subcutaneous Q24H   insulin aspart  0-20 Units Subcutaneous TID WC   insulin aspart  0-5 Units Subcutaneous QHS   magnesium oxide  200 mg Oral Daily   metoprolol tartrate  25 mg Oral BID   midodrine  2.5 mg Oral TID WC   mirtazapine  15 mg Oral QHS   pantoprazole  40 mg Oral Daily   pravastatin  40 mg Oral QHS   QUEtiapine  25 mg Oral BID   sodium chloride flush  3 mL Intravenous Q12H   vitamin B-12  5,000 mcg Oral Daily   Continuous Infusions:    Assessment & Plan:   Principal Problem:   Syncope and collapse Active Problems:   Benign essential HTN   OSA (obstructive sleep apnea)   DM type 2 with diabetic mixed hyperlipidemia (HCC)   Diabetic peripheral neuropathy (HCC)   Atrial fibrillation with rapid ventricular response (HCC)  Dehydration   Hypotension   Hypokalemia   Lactic acidosis   #1 syncope: Most likely secondary to cardiac causes.  Patient does have a history of A. fib.  She has A. fib with RVR here.  Patient however is also dehydrated with hypotension on arrival which could mean orthostasis leading to syncope versus tachy causing hypotension. 9/1-Echo with EF 50 to 55%.  No regional wall motion abnormalities.  No significant valvular disease. 9/2 blood pressure improved.  Mildly still tachycardia. Encourage hydration    #2 A. fib with RVR:  Still tachycardic heart rate mildly better.  Cardiology following.   Check TSH  Plan for rate control only.   Lopressor i25 mg twice daily Change lopressor to Toprol before discharge  Avoid amiodarone given risk of thromboembolic event with pharmacologic conversion  Could consider digoxin if BP limits optimal rate control recent creatinine stable will defer for now  No plan for anticoagulation given frequent falls no plans for DCCV given now therapeutically anticoagulated  9/2 continue digoxin and beta-blockers we will follow-up with cardiology     #3 hypotension:  Probably due to dehydration and A. fib RVR  Improved on midodrine     #4 lactic acidosis: Sepsis ruled out.  Likely due to dehydration  9/2 lactic acid 1.3.  Mproved with IV fluids     #5 suspected sepsis: -Sepsis ruled out  Blood cultures to date negative  Urine culture with multiple species  WBC improving  Chest x-ray without pneumonia    #6 hypokalemia: Was repleted and stable   #7 diabetes type 2: Hold metformin and use sliding scale insulin.   #8 obstructive sleep apnea: offered CPAP at night   DVT prophylaxis: Lovenox Code Status: Full Family Communication: None at bedside Disposition Plan:  Status is: Inpatient  Remains inpatient appropriate because:Inpatient level of care appropriate due to severity of illness  Dispo: The patient is from: Home              Anticipated  d/c is to: TBD              Patient currently is not medically stable to d/c.   Difficult to place patient No            LOS: 2 days   Time spent: 35 minutes with more than 50% on COC    Lynn ItoSahar Luvia Orzechowski, MD [facility] Pager [phone]  If 7PM-7AM, please contact night-coverage 01/08/2021, 2:39 PM

## 2021-01-08 NOTE — TOC Progression Note (Signed)
Transition of Care [facility]) - Progression Note    Patient Details  Name: Loretta Webb MRN: [id number] Date of Birth: 1932-03-07  [facility]) CM/SW Contact  Allayne Butcher, RN Phone Number: 01/08/2021, 4:06 PM  Clinical Narrative:    SNF workup and bed search started.  Family and patient are in agreement with short term rehab.  Family prefers [facility] but is open to other options as [facility] rarely has beds available.  Family is agreeable to extend search to [place].     Expected Discharge Plan: Skilled Nursing Facility Barriers to Discharge: Continued Medical Work up  Expected Discharge Plan and Services Expected Discharge Plan: Skilled Nursing Facility   Discharge Planning Services: CM Consult Post Acute Care Choice: Skilled Nursing Facility Living arrangements for the past 2 months: Single Family Home                 DME Arranged: N/A DME Agency: NA       HH Arranged: NA           Social Determinants of Health (SDOH) Interventions    Readmission Risk Interventions No flowsheet data found.

## 2021-01-09 DIAGNOSIS — I959 Hypotension, unspecified: Secondary | ICD-10-CM | POA: Diagnosis not present

## 2021-01-09 DIAGNOSIS — I4891 Unspecified atrial fibrillation: Secondary | ICD-10-CM | POA: Diagnosis not present

## 2021-01-09 LAB — GLUCOSE, CAPILLARY
Glucose-Capillary: 227 mg/dL — ABNORMAL HIGH (ref 70–99)
Glucose-Capillary: 153 mg/dL — ABNORMAL HIGH (ref 70–99)
Glucose-Capillary: 78 mg/dL (ref 70–99)
Glucose-Capillary: 162 mg/dL — ABNORMAL HIGH (ref 70–99)

## 2021-01-09 MED ORDER — MIDODRINE HCL 5 MG PO TABS
5.0000 mg | ORAL_TABLET | Freq: Three times a day (TID) | ORAL | Status: DC
  Administered 2021-01-09 – 2021-01-10 (×5): 5 mg via ORAL
  Filled 2021-01-09 (×5): qty 1

## 2021-01-09 MED ORDER — METOPROLOL TARTRATE 50 MG PO TABS
50.0000 mg | ORAL_TABLET | Freq: Two times a day (BID) | ORAL | Status: DC
  Administered 2021-01-09 – 2021-01-12 (×6): 50 mg via ORAL
  Filled 2021-01-09 (×6): qty 1

## 2021-01-09 NOTE — TOC Progression Note (Signed)
Transition of Care [facility]) - Progression Note    Patient Details  Name: Loretta Webb MRN: [id number] Date of Birth: Oct 28, 1931  Transition of Care [facility]) CM/SW Contact  Ashley Royalty Lutricia Feil, RN Phone Number:[phone] 01/09/2021, 1:12 PM  Clinical Narrative:    Family requested consult. RN spoke with daughter [name] [phone] concerning bed offers (5 in [place] and 1 in [place]). Daughter prefers to stay in the [facility] area for the convenience of the family. RN provided more information via facility on [email or web address] due to possible consideration for placement in [facility].  Pt lives along in a single family home with a good support system and several daughters. States pt obtains her pharmacy medications from [facility] and daughter Eunice Blase arranges all medications. Pt has sufficient transportation between all her family that participates in her care. DME in the home cane and walker. Contact person will be [name] as noted above.  TOC will continue to communicate with family/pt on beds that are offered, especially in the [facility] area.   Expected Discharge Plan: Skilled Nursing Facility Barriers to Discharge: Continued Medical Work up  Expected Discharge Plan and Services Expected Discharge Plan: Skilled Nursing Facility   Discharge Planning Services: CM Consult Post Acute Care Choice: Skilled Nursing Facility Living arrangements for the past 2 months: Single Family Home                 DME Arranged: N/A DME Agency: NA       HH Arranged: NA           Social Determinants of Health (SDOH) Interventions    Readmission Risk Interventions No flowsheet data found.

## 2021-01-09 NOTE — Progress Notes (Signed)
Progress Note  Patient Name: Loretta Webb Date of Encounter: 01/09/2021  [facility] Cardiologist: new- Agbor-Etang rounding  Subjective   No acute events overnight, heart rates are better controlled.  Daughter at bedside.  Blood pressure was low, patient started on midodrine.    Inpatient Medications    Scheduled Meds:  aspirin  81 mg Oral Daily   cholecalciferol  1,000 Units Oral Weekly   digoxin  0.125 mg Oral Daily   donepezil  10 mg Oral Daily   DULoxetine  60 mg Oral Daily   enoxaparin (LOVENOX) injection  40 mg Subcutaneous Q24H   insulin aspart  0-20 Units Subcutaneous TID WC   insulin aspart  0-5 Units Subcutaneous QHS   magnesium oxide  200 mg Oral Daily   metoprolol tartrate  50 mg Oral BID   midodrine  5 mg Oral TID WC   mirtazapine  15 mg Oral QHS   pantoprazole  40 mg Oral Daily   pravastatin  40 mg Oral QHS   QUEtiapine  25 mg Oral BID   sodium chloride flush  3 mL Intravenous Q12H   vitamin B-12  5,000 mcg Oral Daily   Continuous Infusions:  PRN Meds: acetaminophen, ondansetron **OR** ondansetron (ZOFRAN) IV, sodium chloride   Vital Signs    Vitals:   01/09/21 0732 01/09/21 0837 01/09/21 0853 01/09/21 1115  BP: (!) 134/92 138/88  96/76  Pulse: 68  (!) 101 97  Resp: 16   18  Temp: 97.8 F (36.6 C)   98.7 F (37.1 C)  TempSrc:    Oral  SpO2: 94%   93%  Weight:      Height:        Intake/Output Summary (Last 24 hours) at 01/09/2021 1304 Last data filed at 01/09/2021 1047 Gross per 24 hour  Intake 480 ml  Output 2200 ml  Net -1720 ml   Last 3 Weights 01/09/2021 01/08/2021 01/07/2021  Weight (lbs) 158 lb 8.2 oz 152 lb 8.9 oz 158 lb 11.7 oz  Weight (kg) 71.9 kg 69.2 kg 72 kg      Telemetry    Atrial fibrillation, heart rate 96- Personally Reviewed  ECG     - Personally Reviewed  Physical Exam   GEN: No acute distress.   Neck: No JVD Cardiac: Irregular irregular Respiratory: Clear to auscultation bilaterally. GI: Soft, nontender,  non-distended  MS: No edema; No deformity. Neuro:  Nonfocal  Psych: Normal affect   Labs    High Sensitivity Troponin:  No results for input(s): TROPONINIHS in the last 720 hours.    Chemistry Recent Labs  Lab 01/05/21 1947 01/06/21 0100 01/07/21 0414  NA 136 136  --   K 3.4* 3.6  --   CL 100 101  --   CO2 24 26  --   GLUCOSE 216* 112*  --   BUN 23 21  --   CREATININE 1.23* 1.06* 0.96  CALCIUM 8.8* 9.0  --   PROT 6.2* 6.7  --   ALBUMIN 3.6 3.8  --   AST 29 27  --   ALT 12 11  --   ALKPHOS 41 43  --   BILITOT 0.6 0.6  --   GFRNONAA 43* 51* 57*  ANIONGAP 12 9  --      Hematology Recent Labs  Lab 01/05/21 1947 01/06/21 0100 01/07/21 0414  WBC 11.7* 13.2* 10.8*  RBC 4.24 4.33 4.02  HGB 10.8* 11.2* 10.3*  HCT 33.8* 34.2* 32.7*  MCV  79.7* 79.0* 81.3  MCH 25.5* 25.9* 25.6*  MCHC 32.0 32.7 31.5  RDW 18.6* 18.4* 18.7*  PLT 344 353 317    BNPNo results for input(s): BNP, PROBNP in the last 168 hours.   DDimer No results for input(s): DDIMER in the last 168 hours.   Radiology    No results found.  Cardiac Studies   Echo 01/06/2021 1. Left ventricular ejection fraction, by estimation, is 50 to 55%. The  left ventricle has low normal function. The left ventricle has no regional  wall motion abnormalities. Left ventricular diastolic parameters are  indeterminate.   2. Right ventricular systolic function is normal. The right ventricular  size is normal.   3. Left atrial size was mildly dilated.   4. The mitral valve is normal in structure. Mild mitral valve  regurgitation.   5. The aortic valve is tricuspid. Aortic valve regurgitation is not  visualized. Mild aortic valve sclerosis is present, with no evidence of  aortic valve stenosis.   6. The inferior vena cava is normal in size with greater than 50%  respiratory variability, suggesting right atrial pressure of 3 mmHg.   Patient Profile     87 y.o. female with history of diabetes, hypertension, atrial  fibrillation, hard of hearing presenting with due to falls, found to be in atrial fibrillation with rapid ventricular response.  Assessment & Plan    A. fib RVR -Heart rate better controlled, beta-blockers increase limited by low blood pressures. -Continue Lopressor at current dose, digoxin. -Agree with midodrine for hypotension -No anticoagulation due to fall risk.  Discussed in detail with patient and daughter. -Echo with preserved EF  2.  Hypertension, frequent falls -Advised to use walker as instructed -PT OT, dispo planning -Agree with midodrine, titrate as needed for adequate BP support  No additional cardiac testing planned at this point.  Continue medications as above.  Total encounter time 35 minutes  Greater than 50% was spent in counseling and coordination of care with the patient        Signed, Debbe Odea, MD  01/09/2021, 1:04 PM

## 2021-01-09 NOTE — Progress Notes (Signed)
PROGRESS NOTE    Loretta Webb  JEH:[id number] DOB: 06-04-1931 DOA: 01/05/2021 PCP: [name], MD    Brief Narrative:   Loretta Webb is a 87 y.o. female with medical history significant of atrial fibrillation, chronic pain syndrome, osteoarthritis, diabetes type 2, GERD, fibromyalgia, history of esophageal stricture, essential hypertension and obstructive sleep apnea who was brought in from home after a witnessed syncopal episode this morning.  She had physical therapy at the time.  Did not come to the ER.  In the afternoon she had another 1.  Patient was evaluated by EMS where she was found to be in A. fib with RVR.  Rate it was in the 180s at the time.  She received 10 L of metoprolol.  Patient was noted to have oxygen sats of 92% on room air and was placed on 2 L of oxygen.  Brought into the ER where she was found to be hypotensive.  Patient is systolic blood pressure was in the 90 and diastolic 57.  Her heart rate was 110 on arrival.  She did have some bruises on lower and upper extremities some skin abrasions.  She is slightly confused but has baseline dementia although mild.  She was noted to also have some hypothermia, A. fib with RVR as well as leukocytosis.  Patient has lactic acidosis with suspicion for possible sepsis but source is unknown.  Empirically started on vancomycin and cefepime in the ER and being admitted to the hospital for evaluation of syncope..    9/1- felt anxious earlier per nsg, was given ativan, now she reports feeling better.  As Telemetry currently not functioning made nursing aware. 9/2 walk with PT felt little short of breath.  Heart rate in 110s but overall getting better  9/3 patient was being taken to the bathroom with the nursing student and heart rate tacky from 108-100 's.  Couldn't say she has sob. No dizziness.   Consultants:  Cardiology  Procedures:   Antimicrobials:      Subjective: No chest pain or  lightheadedness  Objective: Vitals:   01/09/21 0732 01/09/21 0837 01/09/21 0853 01/09/21 1115  BP: (!) 134/92 138/88  96/76  Pulse: 68  (!) 101 97  Resp: 16   18  Temp: 97.8 F (36.6 C)   98.7 F (37.1 C)  TempSrc:    Oral  SpO2: 94%   93%  Weight:      Height:        Intake/Output Summary (Last 24 hours) at 01/09/2021 1119 Last data filed at 01/09/2021 1047 Gross per 24 hour  Intake 480 ml  Output 2200 ml  Net -1720 ml   Filed Weights   01/07/21 0440 01/08/21 0500 01/09/21 0500  Weight: 72 kg 69.2 kg 71.9 kg    Examination:  NAD, calm CTA no wheeze rales rhonchi's Irregular, mildly tacky, S1-S2 no gallops Soft benign positive bowel sounds No edema Awake and alert grossly intact   Data Reviewed: I have personally reviewed following labs and imaging studies  CBC: Recent Labs  Lab 01/05/21 1947 01/06/21 0100 01/07/21 0414  WBC 11.7* 13.2* 10.8*  NEUTROABS 9.0*  --   --   HGB 10.8* 11.2* 10.3*  HCT 33.8* 34.2* 32.7*  MCV 79.7* 79.0* 81.3  PLT 344 353 317   Basic Metabolic Panel: Recent Labs  Lab 01/05/21 1947 01/06/21 0100 01/07/21 0414  NA 136 136  --   K 3.4* 3.6  --   CL 100 101  --  CO2 24 26  --   GLUCOSE 216* 112*  --   BUN 23 21  --   CREATININE 1.23* 1.06* 0.96  CALCIUM 8.8* 9.0  --   MG  --  2.4  --    GFR: Estimated Creatinine Clearance: 39.2 mL/min (by C-G formula based on SCr of 0.96 mg/dL). Liver Function Tests: Recent Labs  Lab 01/05/21 1947 01/06/21 0100  AST 29 27  ALT 12 11  ALKPHOS 41 43  BILITOT 0.6 0.6  PROT 6.2* 6.7  ALBUMIN 3.6 3.8   No results for input(s): LIPASE, AMYLASE in the last 168 hours. No results for input(s): AMMONIA in the last 168 hours. Coagulation Profile: Recent Labs  Lab 01/05/21 1947  INR 1.1   Cardiac Enzymes: No results for input(s): CKTOTAL, CKMB, CKMBINDEX, TROPONINI in the last 168 hours. BNP (last 3 results) No results for input(s): PROBNP in the last 8760 hours. HbA1C: No results  for input(s): HGBA1C in the last 72 hours.  CBG: Recent Labs  Lab 01/08/21 0807 01/08/21 1213 01/08/21 1708 01/08/21 2217 01/09/21 0859  GLUCAP 126* 71 172* 144* 227*   Lipid Profile: No results for input(s): CHOL, HDL, LDLCALC, TRIG, CHOLHDL, LDLDIRECT in the last 72 hours. Thyroid Function Tests: Recent Labs    01/07/21 0414  TSH 3.528   Anemia Panel: No results for input(s): VITAMINB12, FOLATE, FERRITIN, TIBC, IRON, RETICCTPCT in the last 72 hours. Sepsis Labs: Recent Labs  Lab 01/05/21 1947 01/05/21 2030 01/07/21 0840 01/08/21 0810  LATICACIDVEN 3.7* 2.6* 2.3* 1.3    Recent Results (from the past 240 hour(s))  Resp Panel by RT-PCR (Flu A&B, Covid) Nasopharyngeal Swab     Status: None   Collection Time: 01/05/21  7:47 PM   Specimen: Nasopharyngeal Swab; Nasopharyngeal(NP) swabs in vial transport medium  Result Value Ref Range Status   SARS Coronavirus 2 by RT PCR NEGATIVE NEGATIVE Final    Comment: (NOTE) SARS-CoV-2 target nucleic acids are NOT DETECTED.  The SARS-CoV-2 RNA is generally detectable in upper respiratory specimens during the acute phase of infection. The lowest concentration of SARS-CoV-2 viral copies this assay can detect is 138 copies/mL. A negative result does not preclude SARS-Cov-2 infection and should not be used as the sole basis for treatment or other patient management decisions. A negative result may occur with  improper specimen collection/handling, submission of specimen other than nasopharyngeal swab, presence of viral mutation(s) within the areas targeted by this assay, and inadequate number of viral copies(<138 copies/mL). A negative result must be combined with clinical observations, patient history, and epidemiological information. The expected result is Negative.  Fact Sheet for Patients:  BloggerCourse.com  Fact Sheet for Healthcare Providers:  SeriousBroker.it  This test  is no t yet approved or cleared by the [place] FDA and  has been authorized for detection and/or diagnosis of SARS-CoV-2 by FDA under an Emergency Use Authorization (EUA). This EUA will remain  in effect (meaning this test can be used) for the duration of the COVID-19 declaration under Section 564(b)(1) of the Act, 21 U.S.C.section 360bbb-3(b)(1), unless the authorization is terminated  or revoked sooner.       Influenza A by PCR NEGATIVE NEGATIVE Final   Influenza B by PCR NEGATIVE NEGATIVE Final    Comment: (NOTE) The Xpert Xpress SARS-CoV-2/FLU/RSV plus assay is intended as an aid in the diagnosis of influenza from Nasopharyngeal swab specimens and should not be used as a sole basis for treatment. Nasal washings and aspirates are unacceptable for  Xpert Xpress SARS-CoV-2/FLU/RSV testing.  Fact Sheet for Patients: BloggerCourse.comhttps://www.fda.gov/media/152166/download  Fact Sheet for Healthcare Providers: SeriousBroker.ithttps://www.fda.gov/media/152162/download  This test is not yet approved or cleared by the [place] FDA and has been authorized for detection and/or diagnosis of SARS-CoV-2 by FDA under an Emergency Use Authorization (EUA). This EUA will remain in effect (meaning this test can be used) for the duration of the COVID-19 declaration under Section 564(b)(1) of the Act, 21 U.S.C. section 360bbb-3(b)(1), unless the authorization is terminated or revoked.  Performed at [facility], [address] [place], [place] [address]   Urine Culture     Status: Abnormal   Collection Time: 01/05/21  8:30 PM   Specimen: Urine, Random  Result Value Ref Range Status   Specimen Description   Final    URINE, RANDOM Performed at [facility], [address] [place], [place] [address]    Special Requests   Final    NONE Performed at [facility], [address] [place], [place] [address]    Culture MULTIPLE SPECIES PRESENT, SUGGEST RECOLLECTION (A)  Final    Report Status 01/07/2021 FINAL  Final  Blood Culture (routine x 2)     Status: None (Preliminary result)   Collection Time: 01/05/21  8:31 PM   Specimen: BLOOD  Result Value Ref Range Status   Specimen Description BLOOD LEFT ASSIST CONTROL  Final   Special Requests   Final    BOTTLES DRAWN AEROBIC AND ANAEROBIC Blood Culture results may not be optimal due to an inadequate volume of blood received in culture bottles   Culture   Final    NO GROWTH 4 DAYS Performed at [facility], [address] [place], [place] [address]    Report Status PENDING  Incomplete  Blood Culture (routine x 2)     Status: None (Preliminary result)   Collection Time: 01/05/21  8:31 PM   Specimen: BLOOD  Result Value Ref Range Status   Specimen Description BLOOD RIGHT HAND  Final   Special Requests   Final    BOTTLES DRAWN AEROBIC AND ANAEROBIC Blood Culture adequate volume   Culture   Final    NO GROWTH 4 DAYS Performed at [facility], [address] [place], [place] [address]    Report Status PENDING  Incomplete         Radiology Studies: No results found.      Scheduled Meds:  aspirin  81 mg Oral Daily   cholecalciferol  1,000 Units Oral Weekly   digoxin  0.125 mg Oral Daily   donepezil  10 mg Oral Daily   DULoxetine  60 mg Oral Daily   enoxaparin (LOVENOX) injection  40 mg Subcutaneous Q24H   insulin aspart  0-20 Units Subcutaneous TID WC   insulin aspart  0-5 Units Subcutaneous QHS   magnesium oxide  200 mg Oral Daily   metoprolol tartrate  50 mg Oral BID   midodrine  5 mg Oral TID WC   mirtazapine  15 mg Oral QHS   pantoprazole  40 mg Oral Daily   pravastatin  40 mg Oral QHS   QUEtiapine  25 mg Oral BID   sodium chloride flush  3 mL Intravenous Q12H   vitamin B-12  5,000 mcg Oral Daily   Continuous Infusions:    Assessment & Plan:   Principal Problem:   Syncope and collapse Active Problems:   Benign essential HTN   OSA (obstructive sleep apnea)    DM type  2 with diabetic mixed hyperlipidemia (HCC)   Diabetic peripheral neuropathy (HCC)   Atrial fibrillation with rapid ventricular response (HCC)   Dehydration   Hypotension   Hypokalemia   Lactic acidosis   #1 syncope: Most likely secondary to cardiac causes.  Patient does have a history of A. fib.  She has A. fib with RVR here.  Patient however is also dehydrated with hypotension on arrival which could mean orthostasis leading to syncope versus tachy causing hypotension. 9/1-Echo with EF 50 to 55%.  No regional wall motion abnormalities.  No significant valvular disease. 9/2 blood pressure improved.  Mildly still tachycardia. Encourage hydration 9/3 BP better with midodrine.  Will increase dose so we can use beta-blockers for better heart rate control   #2 A. fib with RVR:  Still tachycardic heart rate mildly better.  Cardiology following.   Check TSH  Plan for rate control only.   Lopressor i25 mg twice daily Change lopressor to Toprol before discharge  Avoid amiodarone given risk of thromboembolic event with pharmacologic conversion  Could consider digoxin if BP limits optimal rate control recent creatinine stable will defer for now  No plan for anticoagulation given frequent falls no plans for DCCV given now therapeutically anticoagulated  9/2 continue digoxin and beta-blockers we will follow-up with cardiology 9/3 still tachycardic with minimal ambulation.  Will increase metoprolol to 50 mg twice daily with parameters    #3 hypotension:  Probably due to dehydration and A. fib RVR  9/3 improved with midodrine.  Will increase dose to 5 mg 3 times daily so we can increase beta-blockers above     #4 lactic acidosis: Sepsis ruled out.  Likely due to dehydration  Improved with IV fluids lactic acid 1.3       #5 suspected sepsis: -Sepsis ruled out  Blood cultures to date negative  Urine culture with multiple species  WBC improving  Chest x-ray without pneumonia    #6  hypokalemia: Was repleted and stable   #7 diabetes type 2: Hold metformin and use sliding scale insulin.   #8 obstructive sleep apnea: offered CPAP at night   DVT prophylaxis: Lovenox Code Status: Full Family Communication: Daughter at bedside Disposition Plan:  Status is: Inpatient  Remains inpatient appropriate because:Inpatient level of care appropriate due to severity of illness  Dispo: The patient is from: Home              Anticipated d/c is to: SNF              Patient currently is not medically stable to d/c.   Difficult to place patient No    Heart rate still in the further control.  SNF bed pending        LOS: 3 days   Time spent: 35 minutes with more than 50% on COC    Lynn Ito, MD [facility] Pager [phone]  If 7PM-7AM, please contact night-coverage 01/09/2021, 11:19 AM

## 2021-01-10 DIAGNOSIS — I4891 Unspecified atrial fibrillation: Secondary | ICD-10-CM | POA: Diagnosis not present

## 2021-01-10 LAB — GLUCOSE, CAPILLARY
Glucose-Capillary: 232 mg/dL — ABNORMAL HIGH (ref 70–99)
Glucose-Capillary: 65 mg/dL — ABNORMAL LOW (ref 70–99)
Glucose-Capillary: 123 mg/dL — ABNORMAL HIGH (ref 70–99)
Glucose-Capillary: 117 mg/dL — ABNORMAL HIGH (ref 70–99)
Glucose-Capillary: 127 mg/dL — ABNORMAL HIGH (ref 70–99)

## 2021-01-10 MED ORDER — POLYETHYLENE GLYCOL 3350 17 G PO PACK
17.0000 g | PACK | Freq: Two times a day (BID) | ORAL | Status: DC
  Administered 2021-01-10 – 2021-01-12 (×5): 17 g via ORAL
  Filled 2021-01-10 (×5): qty 1

## 2021-01-10 MED ORDER — MIDODRINE HCL 5 MG PO TABS
10.0000 mg | ORAL_TABLET | Freq: Three times a day (TID) | ORAL | Status: DC
  Administered 2021-01-10 – 2021-01-12 (×6): 10 mg via ORAL
  Filled 2021-01-10 (×6): qty 2

## 2021-01-10 MED ORDER — SENNOSIDES-DOCUSATE SODIUM 8.6-50 MG PO TABS
1.0000 | ORAL_TABLET | Freq: Two times a day (BID) | ORAL | Status: DC
  Administered 2021-01-10 – 2021-01-11 (×4): 1 via ORAL
  Filled 2021-01-10 (×5): qty 1

## 2021-01-10 NOTE — TOC Progression Note (Signed)
Transition of Care [facility]) - Progression Note    Patient Details  Name: Loretta Webb MRN: [id number] Date of Birth: 07-21-1931  Transition of Care [facility]) CM/SW Contact  Ashley Royalty Lutricia Feil, RN Phone Number: 01/10/2021, 2:15 PM  Clinical Narrative:    Spoke with daughter [name] today on again all bed offers. Considering [facility] or [facility]. Aware facility needs to be contacted for bed availability however when pt is medical stable for discharged discussed possible acceptance of one of the facility's of choice and if or when another bed becoming available at the facility in [facility] the social worker at the current facility can assist with possible transport if appropriate at that time.  TOC remains available to assist with any other discharge needs. Attending has been updated on the above information.  Expected Discharge Plan: Skilled Nursing Facility Barriers to Discharge: Continued Medical Work up  Expected Discharge Plan and Services Expected Discharge Plan: Skilled Nursing Facility   Discharge Planning Services: CM Consult Post Acute Care Choice: Skilled Nursing Facility Living arrangements for the past 2 months: Single Family Home                 DME Arranged: N/A DME Agency: NA       HH Arranged: NA           Social Determinants of Health (SDOH) Interventions    Readmission Risk Interventions No flowsheet data found.

## 2021-01-10 NOTE — Progress Notes (Signed)
PROGRESS NOTE    Loretta Webb  NID:[id number] DOB: December 11, 1931 DOA: 01/05/2021 PCP: [name], MD    Brief Narrative:   Loretta Webb is a 87 y.o. female with medical history significant of atrial fibrillation, chronic pain syndrome, osteoarthritis, diabetes type 2, GERD, fibromyalgia, history of esophageal stricture, essential hypertension and obstructive sleep apnea who was brought in from home after a witnessed syncopal episode this morning.  She had physical therapy at the time.  Did not come to the ER.  In the afternoon she had another 1.  Patient was evaluated by EMS where she was found to be in A. fib with RVR.  Rate it was in the 180s at the time.  She received 10 L of metoprolol.  Patient was noted to have oxygen sats of 92% on room air and was placed on 2 L of oxygen.  Brought into the ER where she was found to be hypotensive.  Patient is systolic blood pressure was in the 90 and diastolic 57.  Her heart rate was 110 on arrival.  She did have some bruises on lower and upper extremities some skin abrasions.  She is slightly confused but has baseline dementia although mild.  She was noted to also have some hypothermia, A. fib with RVR as well as leukocytosis.  Patient has lactic acidosis with suspicion for possible sepsis but source is unknown.  Empirically started on vancomycin and cefepime in the ER and being admitted to the hospital for evaluation of syncope..    9/1- felt anxious earlier per nsg, was given ativan, now she reports feeling better.  As Telemetry currently not functioning made nursing aware. 9/2 walk with PT felt little short of breath.  Heart rate in 110s but overall getting better  9/3 patient was being taken to the bathroom with the nursing student and heart rate tacky from 108-100 's.  Couldn't say she has sob. No dizziness. 9/4 no complaints this AM.  Denies shortness of breath, chest pain, dizziness or lightheadedness  Consultants:  Cardiology  Procedures:    Antimicrobials:      Subjective: As above.. NO bm would like miralax.    Objective: Vitals:   01/10/21 0447 01/10/21 0731 01/10/21 0841 01/10/21 1202  BP: 112/64 126/86  121/67  Pulse: 82 85 100 94  Resp: 18 20  18   Temp: 98.2 F (36.8 C) 98.1 F (36.7 C)  98 F (36.7 C)  TempSrc:  Oral    SpO2: 93% 96%  100%  Weight:      Height:        Intake/Output Summary (Last 24 hours) at 01/10/2021 1227 Last data filed at 01/10/2021 1037 Gross per 24 hour  Intake 480 ml  Output 700 ml  Net -220 ml   Filed Weights   01/07/21 0440 01/08/21 0500 01/09/21 0500  Weight: 72 kg 69.2 kg 71.9 kg    Examination: Gen, nad family at bedside Cta no w/r/r Irregular s1/s2 no gallop Soft bening +bs No edema.  Grossly intact   Data Reviewed: I have personally reviewed following labs and imaging studies  CBC: Recent Labs  Lab 01/05/21 1947 01/06/21 0100 01/07/21 0414  WBC 11.7* 13.2* 10.8*  NEUTROABS 9.0*  --   --   HGB 10.8* 11.2* 10.3*  HCT 33.8* 34.2* 32.7*  MCV 79.7* 79.0* 81.3  PLT 344 353 317   Basic Metabolic Panel: Recent Labs  Lab 01/05/21 1947 01/06/21 0100 01/07/21 0414  NA 136 136  --  K 3.4* 3.6  --   CL 100 101  --   CO2 24 26  --   GLUCOSE 216* 112*  --   BUN 23 21  --   CREATININE 1.23* 1.06* 0.96  CALCIUM 8.8* 9.0  --   MG  --  2.4  --    GFR: Estimated Creatinine Clearance: 39.2 mL/min (by C-G formula based on SCr of 0.96 mg/dL). Liver Function Tests: Recent Labs  Lab 01/05/21 1947 01/06/21 0100  AST 29 27  ALT 12 11  ALKPHOS 41 43  BILITOT 0.6 0.6  PROT 6.2* 6.7  ALBUMIN 3.6 3.8   No results for input(s): LIPASE, AMYLASE in the last 168 hours. No results for input(s): AMMONIA in the last 168 hours. Coagulation Profile: Recent Labs  Lab 01/05/21 1947  INR 1.1   Cardiac Enzymes: No results for input(s): CKTOTAL, CKMB, CKMBINDEX, TROPONINI in the last 168 hours. BNP (last 3 results) No results for input(s): PROBNP in the last  8760 hours. HbA1C: No results for input(s): HGBA1C in the last 72 hours.  CBG: Recent Labs  Lab 01/09/21 1301 01/09/21 1611 01/09/21 2108 01/10/21 0837 01/10/21 1202  GLUCAP 153* 78 162* 232* 65*   Lipid Profile: No results for input(s): CHOL, HDL, LDLCALC, TRIG, CHOLHDL, LDLDIRECT in the last 72 hours. Thyroid Function Tests: No results for input(s): TSH, T4TOTAL, FREET4, T3FREE, THYROIDAB in the last 72 hours.  Anemia Panel: No results for input(s): VITAMINB12, FOLATE, FERRITIN, TIBC, IRON, RETICCTPCT in the last 72 hours. Sepsis Labs: Recent Labs  Lab 01/05/21 1947 01/05/21 2030 01/07/21 0840 01/08/21 0810  LATICACIDVEN 3.7* 2.6* 2.3* 1.3    Recent Results (from the past 240 hour(s))  Resp Panel by RT-PCR (Flu A&B, Covid) Nasopharyngeal Swab     Status: None   Collection Time: 01/05/21  7:47 PM   Specimen: Nasopharyngeal Swab; Nasopharyngeal(NP) swabs in vial transport medium  Result Value Ref Range Status   SARS Coronavirus 2 by RT PCR NEGATIVE NEGATIVE Final    Comment: (NOTE) SARS-CoV-2 target nucleic acids are NOT DETECTED.  The SARS-CoV-2 RNA is generally detectable in upper respiratory specimens during the acute phase of infection. The lowest concentration of SARS-CoV-2 viral copies this assay can detect is 138 copies/mL. A negative result does not preclude SARS-Cov-2 infection and should not be used as the sole basis for treatment or other patient management decisions. A negative result may occur with  improper specimen collection/handling, submission of specimen other than nasopharyngeal swab, presence of viral mutation(s) within the areas targeted by this assay, and inadequate number of viral copies(<138 copies/mL). A negative result must be combined with clinical observations, patient history, and epidemiological information. The expected result is Negative.  Fact Sheet for Patients:  BloggerCourse.comhttps://www.fda.gov/media/152166/download  Fact Sheet for  Healthcare Providers:  SeriousBroker.ithttps://www.fda.gov/media/152162/download  This test is no t yet approved or cleared by the [place] FDA and  has been authorized for detection and/or diagnosis of SARS-CoV-2 by FDA under an Emergency Use Authorization (EUA). This EUA will remain  in effect (meaning this test can be used) for the duration of the COVID-19 declaration under Section 564(b)(1) of the Act, 21 U.S.C.section 360bbb-3(b)(1), unless the authorization is terminated  or revoked sooner.       Influenza A by PCR NEGATIVE NEGATIVE Final   Influenza B by PCR NEGATIVE NEGATIVE Final    Comment: (NOTE) The Xpert Xpress SARS-CoV-2/FLU/RSV plus assay is intended as an aid in the diagnosis of influenza from Nasopharyngeal swab specimens and  should not be used as a sole basis for treatment. Nasal washings and aspirates are unacceptable for Xpert Xpress SARS-CoV-2/FLU/RSV testing.  Fact Sheet for Patients: BloggerCourse.com  Fact Sheet for Healthcare Providers: SeriousBroker.it  This test is not yet approved or cleared by the [place] FDA and has been authorized for detection and/or diagnosis of SARS-CoV-2 by FDA under an Emergency Use Authorization (EUA). This EUA will remain in effect (meaning this test can be used) for the duration of the COVID-19 declaration under Section 564(b)(1) of the Act, 21 U.S.C. section 360bbb-3(b)(1), unless the authorization is terminated or revoked.  Performed at [facility], [address] [place], [place] [address]   Urine Culture     Status: Abnormal   Collection Time: 01/05/21  8:30 PM   Specimen: Urine, Random  Result Value Ref Range Status   Specimen Description   Final    URINE, RANDOM Performed at [facility], [address] [place], [place] [address]    Special Requests   Final    NONE Performed at [facility], [address] [place], [place]  [address]    Culture MULTIPLE SPECIES PRESENT, SUGGEST RECOLLECTION (A)  Final   Report Status 01/07/2021 FINAL  Final  Blood Culture (routine x 2)     Status: None (Preliminary result)   Collection Time: 01/05/21  8:31 PM   Specimen: BLOOD  Result Value Ref Range Status   Specimen Description BLOOD LEFT ASSIST CONTROL  Final   Special Requests   Final    BOTTLES DRAWN AEROBIC AND ANAEROBIC Blood Culture results may not be optimal due to an inadequate volume of blood received in culture bottles   Culture   Final    NO GROWTH 4 DAYS Performed at [facility], [address], [place], [place] [address]    Report Status PENDING  Incomplete  Blood Culture (routine x 2)     Status: None (Preliminary result)   Collection Time: 01/05/21  8:31 PM   Specimen: BLOOD  Result Value Ref Range Status   Specimen Description BLOOD RIGHT HAND  Final   Special Requests   Final    BOTTLES DRAWN AEROBIC AND ANAEROBIC Blood Culture adequate volume   Culture   Final    NO GROWTH 4 DAYS Performed at [facility], [address] [place], [place] [address]    Report Status PENDING  Incomplete         Radiology Studies: No results found.      Scheduled Meds:  aspirin  81 mg Oral Daily   cholecalciferol  1,000 Units Oral Weekly   digoxin  0.125 mg Oral Daily   donepezil  10 mg Oral Daily   DULoxetine  60 mg Oral Daily   enoxaparin (LOVENOX) injection  40 mg Subcutaneous Q24H   insulin aspart  0-20 Units Subcutaneous TID WC   insulin aspart  0-5 Units Subcutaneous QHS   magnesium oxide  200 mg Oral Daily   metoprolol tartrate  50 mg Oral BID   midodrine  5 mg Oral TID WC   mirtazapine  15 mg Oral QHS   pantoprazole  40 mg Oral Daily   polyethylene glycol  17 g Oral BID   pravastatin  40 mg Oral QHS   QUEtiapine  25 mg Oral BID   senna-docusate  1 tablet Oral BID   sodium chloride flush  3 mL Intravenous Q12H   vitamin B-12  5,000 mcg Oral Daily   Continuous  Infusions:    Assessment & Plan:   Principal Problem:   Syncope and collapse Active Problems:   Benign essential HTN   OSA (obstructive sleep apnea)   DM type 2 with diabetic mixed hyperlipidemia (HCC)   Diabetic peripheral neuropathy (HCC)   Atrial fibrillation with rapid ventricular response (HCC)   Dehydration   Hypotension   Hypokalemia   Lactic acidosis   #1 syncope: Most likely secondary to cardiac causes.  Patient does have a history of A. fib.  She has A. fib with RVR here.  Patient however is also dehydrated with hypotension on arrival which could mean orthostasis leading to syncope versus tachy causing hypotension. 9/1-Echo with EF 50 to 55%.  No regional wall motion abnormalities.  No significant valvular disease. 9/2 blood pressure improved.  Mildly still tachycardia. Encourage hydration 9/4 BP better with midodrine.  Tolerating beta-blockers for heart rate control  Continue to monitor      #2 A. fib with RVR:  Still tachycardic heart rate mildly better.  Cardiology following.   Check TSH  Plan for rate control only.   Lopressor i25 mg twice daily Change lopressor to Toprol before discharge  Avoid amiodarone given risk of thromboembolic event with pharmacologic conversion  Could consider digoxin if BP limits optimal rate control recent creatinine stable will defer for now  No plan for anticoagulation given frequent falls no plans for DCCV  9/2 continue digoxin and beta-blockers we will follow-up with cardiology 9/4 tolerated increase metoprolol dose.  Heart rate better.      #3 hypotension:  Probably due to dehydration and A. fib RVR  9/4 overall has improved and more close to normotensive.  There.  Where she is on the low side.  Will increase midodrine to 10 mg 3 times daily Continue to monitor       #4 lactic acidosis: Sepsis ruled out  Likely due to dehydration  Improved with IV fluids   #5 Constipation Start bowel regimen as pt has not had bm  for few days.    #5 suspected sepsis: -Sepsis ruled out  Blood cultures to date negative  Urine culture with multiple species  WBC improving  Chest x-ray without pneumonia    #6 hypokalemia: Was repleted and stable   #7 diabetes type 2: Hold metformin and use sliding scale insulin.   #8 obstructive sleep apnea: offered CPAP at night   DVT prophylaxis: Lovenox Code Status: Full Family Communication: Daughter at bedside Disposition Plan:  Status is: Inpatient  Remains inpatient appropriate because:Inpatient level of care appropriate due to severity of illness  Dispo: The patient is from: Home              Anticipated d/c is to: SNF              Patient currently is not medically stable to d/c.   Difficult to place patient No     SNF bed pending        LOS: 4 days   Time spent: 35 minutes with more than 50% on COC    Lynn Ito, MD [facility] Pager [phone]  If 7PM-7AM, please contact night-coverage 01/10/2021, 12:27 PM

## 2021-01-10 NOTE — Progress Notes (Signed)
Progress Note  Patient Name: Loretta Webb Date of Encounter: 01/10/2021  [facility] Cardiologist: new- [place] rounding  Subjective   Feels well.  Tolerating current medications.  Waiting for placement.    Inpatient Medications    Scheduled Meds:  aspirin  81 mg Oral Daily   cholecalciferol  1,000 Units Oral Weekly   digoxin  0.125 mg Oral Daily   donepezil  10 mg Oral Daily   DULoxetine  60 mg Oral Daily   enoxaparin (LOVENOX) injection  40 mg Subcutaneous Q24H   insulin aspart  0-20 Units Subcutaneous TID WC   insulin aspart  0-5 Units Subcutaneous QHS   magnesium oxide  200 mg Oral Daily   metoprolol tartrate  50 mg Oral BID   midodrine  10 mg Oral TID WC   mirtazapine  15 mg Oral QHS   pantoprazole  40 mg Oral Daily   polyethylene glycol  17 g Oral BID   pravastatin  40 mg Oral QHS   QUEtiapine  25 mg Oral BID   senna-docusate  1 tablet Oral BID   sodium chloride flush  3 mL Intravenous Q12H   vitamin B-12  5,000 mcg Oral Daily   Continuous Infusions:  PRN Meds: acetaminophen, ondansetron **OR** ondansetron (ZOFRAN) IV, sodium chloride   Vital Signs    Vitals:   01/10/21 0447 01/10/21 0731 01/10/21 0841 01/10/21 1202  BP: 112/64 126/86  121/67  Pulse: 82 85 100 94  Resp: 18 20  18   Temp: 98.2 F (36.8 C) 98.1 F (36.7 C)  98 F (36.7 C)  TempSrc:  Oral    SpO2: 93% 96%  100%  Weight:      Height:        Intake/Output Summary (Last 24 hours) at 01/10/2021 1332 Last data filed at 01/10/2021 1037 Gross per 24 hour  Intake 240 ml  Output 700 ml  Net -460 ml   Last 3 Weights 01/09/2021 01/08/2021 01/07/2021  Weight (lbs) 158 lb 8.2 oz 152 lb 8.9 oz 158 lb 11.7 oz  Weight (kg) 71.9 kg 69.2 kg 72 kg      Telemetry    Atrial fibrillation, heart rate 80- Personally Reviewed  ECG     - Personally Reviewed  Physical Exam   GEN: No acute distress.   Neck: No JVD Cardiac: Irregular irregular Respiratory: Clear to auscultation  bilaterally. GI: Soft, nontender, non-distended  MS: No edema; No deformity. Neuro:  Nonfocal  Psych: Normal affect   Labs    High Sensitivity Troponin:  No results for input(s): TROPONINIHS in the last 720 hours.    Chemistry Recent Labs  Lab 01/05/21 1947 01/06/21 0100 01/07/21 0414  NA 136 136  --   K 3.4* 3.6  --   CL 100 101  --   CO2 24 26  --   GLUCOSE 216* 112*  --   BUN 23 21  --   CREATININE 1.23* 1.06* 0.96  CALCIUM 8.8* 9.0  --   PROT 6.2* 6.7  --   ALBUMIN 3.6 3.8  --   AST 29 27  --   ALT 12 11  --   ALKPHOS 41 43  --   BILITOT 0.6 0.6  --   GFRNONAA 43* 51* 57*  ANIONGAP 12 9  --      Hematology Recent Labs  Lab 01/05/21 1947 01/06/21 0100 01/07/21 0414  WBC 11.7* 13.2* 10.8*  RBC 4.24 4.33 4.02  HGB 10.8* 11.2* 10.3*  HCT 33.8* 34.2* 32.7*  MCV 79.7* 79.0* 81.3  MCH 25.5* 25.9* 25.6*  MCHC 32.0 32.7 31.5  RDW 18.6* 18.4* 18.7*  PLT 344 353 317    BNPNo results for input(s): BNP, PROBNP in the last 168 hours.   DDimer No results for input(s): DDIMER in the last 168 hours.   Radiology    No results found.  Cardiac Studies   Echo 01/06/2021 1. Left ventricular ejection fraction, by estimation, is 50 to 55%. The  left ventricle has low normal function. The left ventricle has no regional  wall motion abnormalities. Left ventricular diastolic parameters are  indeterminate.   2. Right ventricular systolic function is normal. The right ventricular  size is normal.   3. Left atrial size was mildly dilated.   4. The mitral valve is normal in structure. Mild mitral valve  regurgitation.   5. The aortic valve is tricuspid. Aortic valve regurgitation is not  visualized. Mild aortic valve sclerosis is present, with no evidence of  aortic valve stenosis.   6. The inferior vena cava is normal in size with greater than 50%  respiratory variability, suggesting right atrial pressure of 3 mmHg.   Patient Profile     87 y.o. female with history  of diabetes, hypertension, atrial fibrillation, hard of hearing presenting with due to falls, found to be in atrial fibrillation with rapid ventricular response.  Assessment & Plan    A. fib RVR -Heart rate controlled -Continue Lopressor , digoxin. -No anticoagulation due to fall risk.  Discussed in detail with patient and daughter. -Echo with preserved EF -Waiting for placement, okay to be discharged from a cardiac perspective.  2.  Hypertension, frequent falls -PT OT, dispo planning to rehab facility -Midodrine for BP support  No additional cardiac testing planned at this point.  Heart rate controlled continue medications as above.  Cardiology will sign off.  Total encounter time 35 minutes  Greater than 50% was spent in counseling and coordination of care with the patient        Signed, Debbe Odea, MD  01/10/2021, 1:32 PM

## 2021-01-10 NOTE — Progress Notes (Signed)
Notified by NT Adelina Mings that family in room is turning off pts telemetry monitor, explained to family that the doctor has an order in place for tele monitoring, family requesting that it be discontinued, Dr Marylu Lund sent a message with the above info

## 2021-01-11 LAB — GLUCOSE, CAPILLARY
Glucose-Capillary: 136 mg/dL — ABNORMAL HIGH (ref 70–99)
Glucose-Capillary: 137 mg/dL — ABNORMAL HIGH (ref 70–99)
Glucose-Capillary: 98 mg/dL (ref 70–99)
Glucose-Capillary: 162 mg/dL — ABNORMAL HIGH (ref 70–99)
Glucose-Capillary: 132 mg/dL — ABNORMAL HIGH (ref 70–99)

## 2021-01-11 LAB — RESP PANEL BY RT-PCR (FLU A&B, COVID) ARPGX2
Influenza A by PCR: NEGATIVE
Influenza B by PCR: NEGATIVE
SARS Coronavirus 2 by RT PCR: NEGATIVE

## 2021-01-11 MED ORDER — INSULIN ASPART 100 UNIT/ML IJ SOLN: 0.0000 [IU] | Freq: Every day | INTRAMUSCULAR | 11 refills | Status: DC

## 2021-01-11 MED ORDER — INSULIN ASPART 100 UNIT/ML IJ SOLN: 0.0000 [IU] | Freq: Three times a day (TID) | INTRAMUSCULAR | 11 refills | Status: DC

## 2021-01-11 MED ORDER — METOPROLOL TARTRATE 50 MG PO TABS: 50.0000 mg | ORAL_TABLET | Freq: Two times a day (BID) | ORAL | Status: AC

## 2021-01-11 MED ORDER — POLYETHYLENE GLYCOL 3350 17 G PO PACK: 17.0000 g | PACK | Freq: Two times a day (BID) | ORAL | 0 refills | Status: AC

## 2021-01-11 MED ORDER — SENNOSIDES-DOCUSATE SODIUM 8.6-50 MG PO TABS: 1.0000 | ORAL_TABLET | Freq: Two times a day (BID) | ORAL | Status: AC

## 2021-01-11 MED ORDER — MIDODRINE HCL 10 MG PO TABS
10.0000 mg | ORAL_TABLET | Freq: Three times a day (TID) | ORAL | Status: DC
Start: 2021-01-11 — End: 2021-02-08

## 2021-01-11 MED ORDER — DIGOXIN 125 MCG PO TABS: 0.1250 mg | ORAL_TABLET | Freq: Every day | ORAL | Status: DC

## 2021-01-11 MED ORDER — FLEET ENEMA 7-19 GM/118ML RE ENEM
1.0000 | ENEMA | Freq: Once | RECTAL | Status: AC
  Administered 2021-01-11: 11:00:00 1 via RECTAL

## 2021-01-11 NOTE — Progress Notes (Signed)
Occupational Therapy Treatment Patient Details Name: Loretta Webb MRN: [id number] DOB: 12-19-1931 Today's Date: 01/11/2021    History of present illness Loretta Webb is a 87 y.o. female with medical history significant of atrial fibrillation, chronic pain syndrome, osteoarthritis, diabetes type 2, GERD, fibromyalgia, history of esophageal stricture, essential hypertension and obstructive sleep apnea who was brought in from home after a witnessed syncopal episode.   OT comments  Pt up in chair upon entering- daughter present- pt feeling good and willing to work with OT. Showed compare to previous sessions this date ind with supervision sit <> stand- sitting doing  LB ADL's and CG to min A for simulated transfers and functional mobility in room.Pt O2 sats and HR staying steady - got little fatigue but only needed 2 restbreaks in 25 min . Pt would benefit from skilled OT to address impairments and functional limitations nn order to maximize safety and independence while minimizing falls risk and caregiver burden. Upon hospital discharge, recommend STR to maximize pt safety and return to PLOF  Follow Up Recommendations       Equipment Recommendations       Recommendations for Other Services      Precautions / Restrictions Precautions Precautions: Fall Restrictions Weight Bearing Restrictions: No       Mobility Bed Mobility                    Transfers                      Balance                                           ADL either performed or assessed with clinical judgement   ADL                                               Vision       Perception     Praxis      Cognition Arousal/Alertness: Awake/alert Behavior During Therapy: WFL for tasks assessed/performed Overall Cognitive Status: Within Functional Limits for tasks assessed                                          Exercises Other  Exercises Other Exercises: Pt sit<> stand without hands from reclinder after education done for techniques - 2 x 5 reps - Ind with supervision Other Exercises: side steppping done 10 steps - L and R - x 4 - HHA with one hand - no LOB but unsteady Other Exercises: simulated clothing management - no LOB and supervisition - sitting in recliner - LB dressing Ind with supervision for footwear   Shoulder Instructions       General Comments      Pertinent Vitals/ Pain       Pain Assessment: No/denies pain  Home Living Family/patient expects to be discharged to:: Private residence Living Arrangements: Alone Available Help at Discharge: Family;Available PRN/intermittently Type of Home: House   Entrance Stairs-Number of Steps: level entrance from garage per pt. Does report chair lift from basement to main floor.  Bathroom Toilet: Standard     Home Equipment: Environmental consultant - 2 wheels;Cane - single point          Prior Functioning/Environment          Comments: Pt drives and uses [employer] for limited community mobility.   Frequency           Progress Toward Goals  OT Goals(current goals can now be found in the care plan section)     Acute Rehab OT Goals Patient Stated Goal: to go to rehab OT Goal Formulation: With patient/family Time For Goal Achievement: 01/21/21 Potential to Achieve Goals: Good  Plan      Co-evaluation                 AM-PAC OT "6 Clicks" Daily Activity     Outcome Measure                    End of Session        Activity Tolerance     Patient Left     Nurse Communication          Time: 8850-2774 OT Time Calculation (min): 25 min  Charges: OT General Charges $OT Visit: 1 Visit OT Treatments $Self Care/Home Management : 8-22 mins $Therapeutic Activity: 8-22 mins    Jury Caserta OTR/L,CLT 01/11/2021, 1:56 PM

## 2021-01-11 NOTE — Plan of Care (Signed)
p 

## 2021-01-11 NOTE — Discharge Summary (Signed)
Loretta Webb ZOX:[id number] DOB: [birth year] DOA: 01/05/2021  PCP: [name], MD  Admit date: 01/05/2021 Discharge date: 01/11/2021  Admitted From: home Disposition:  SNF  Recommendations for Outpatient Follow-up:  Follow up with PCP in 1 week Please obtain BMP/CBC in one week Please follow up with cardiology in one week     Discharge Condition:Stable CODE STATUS:full  Diet recommendation: Heart Healthy / Carb Modified  Brief/Interim Summary: Per HPI: Loretta Webb is a 87 y.o. female with medical history significant of atrial fibrillation, chronic pain syndrome, osteoarthritis, diabetes type 2, GERD, fibromyalgia, history of esophageal stricture, essential hypertension and obstructive sleep apnea who was brought in from home after a witnessed syncopal episode this morning.  She had physical therapy at the time.  Did not come to the ER.  In the afternoon she had another 1.  Patient was evaluated by EMS where she was found to be in A. fib with RVR.  Rate it was in the 180s at the time.  She received 10 L of metoprolol.  Patient was noted to have oxygen sats of 92% on room air and was placed on 2 L of oxygen.  Brought into the ER where she was found to be hypotensive.  Patient is systolic blood pressure was in the 90 and diastolic 57.  Her heart rate was 110 on arrival.  She did have some bruises on lower and upper extremities some skin abrasions.  She is slightly confused but has baseline dementia although mild.  She was noted to also have some hypothermia, A. fib with RVR as well as leukocytosis.  Patient has lactic acidosis with suspicion for possible sepsis but source is unknown.  Empirically started on vancomycin and cefepime in the ER and being admitted to the hospital for evaluation of syncope..Sodium 136 potassium 3.4 chloride 100 CO2 24.  BUN is 23 creatinine 1.23 and calcium 8.8.  Lactic acid 3.7.  INR 1.1.  COVID-19 and influenza negative.  Urinalysis essentially negative.  Head CT  without contrast and CT cervical spine were negative.  Chest x-ray showed no acute findings.  Patient being admitted with syncopal episode   Hospital course: Patient was in afib rvr, cardiologist consulted as it was difficult to rate control her. Her home cardiac/bp meds held due to her low blood pressure she was started on midodrine so we can initiate metoprolol and increase as tolerated.  She was started on digoxin to by cardiology.  Her heart rate finally controlled.  She had physical therapy and they recommended SNF.  She has been constipated and was started on bowel regimen and was given enema.  Initially was thought she has sepsis but sepsis was ruled out.  #1 syncope: Most likely secondary to cardiac causes.  Patient does have a history of A. fib.  She has A. fib with RVR here.  Patient however is also dehydrated with hypotension on arrival which could mean orthostasis leading to syncope versus tachy causing hypotension. Echo with EF 50 to 55%.  No regional wall motion abnormalities.  No significant valvular disease. Blood pressure improved with midodrine and tolerating beta-blockers for heart rate control. Encouraged hydration     #2 A. fib with RVR:  Was tachycardic on admission difficult to control.  Cardiology was consulted. TSH 3.528 Plan was for rate control.  Was started on Lopressor and increase as tolerated Started on digoxin. May need to ck digoxin level in one week Avoid amiodarone given risk of thromboembolic event with pharmacologic conversion  No plan for anticoagulation given frequent falls no plans for DCCV  Cardiology signed off. F/u as outpatient     #3 hypotension:  Probably due to dehydration and A. fib RVR  Improved with hydration and midodrine Continue midodrine           #4 lactic acidosis: Sepsis ruled out  Likely due to dehydration  Improved with IV fluids    #5 Constipation Start bowel regimen      #5 suspected sepsis: -Sepsis ruled out  Blood  cultures to date negative  Urine culture with multiple species  WBC improving  Chest x-ray without pneumonia     #6 hypokalemia: Was repleted and stable   #7 diabetes type 2: Hold metformin and use sliding scale insulin. When discharged home can resume metformin if ok by her pcp. Should use RISS at rehab and monitor intake A1c 6.6   #8 obstructive sleep apnea:  Offered cpap at night   Discharge Diagnoses:  Principal Problem:   Syncope and collapse Active Problems:   Benign essential HTN   OSA (obstructive sleep apnea)   DM type 2 with diabetic mixed hyperlipidemia (HCC)   Diabetic peripheral neuropathy (HCC)   Atrial fibrillation with rapid ventricular response (HCC)   Dehydration   Hypotension   Hypokalemia   Lactic acidosis    Discharge Instructions  Discharge Instructions     Call MD for:  difficulty breathing, headache or visual disturbances   Complete by: As directed    Call MD for:  persistant dizziness or light-headedness   Complete by: As directed    Diet - low sodium heart healthy   Complete by: As directed    Increase activity slowly   Complete by: As directed       Allergies as of 01/11/2021   No Known Allergies      Medication List     STOP taking these medications    bumetanide 1 MG tablet Commonly known as: BUMEX   metFORMIN 1000 MG tablet Commonly known as: GLUCOPHAGE       TAKE these medications    acetaminophen 500 MG tablet Commonly known as: TYLENOL Take 500 mg by mouth 2 (two) times daily as needed.   aspirin 81 MG chewable tablet Chew by mouth daily.   Cholecalciferol 25 MCG (1000 UT) tablet Take 1,000 Units by mouth once a week.   diclofenac Sodium 1 % Gel Commonly known as: VOLTAREN Apply topically 4 (four) times daily.   digoxin 0.125 MG tablet Commonly known as: LANOXIN Take 1 tablet (0.125 mg total) by mouth daily. Start taking on: January 12, 2021   donepezil 10 MG tablet Commonly known as: ARICEPT Take  10 mg by mouth daily.   DULoxetine 60 MG capsule Commonly known as: CYMBALTA Take 60 mg by mouth daily.   insulin aspart 100 UNIT/ML injection Commonly known as: novoLOG Inject 0-5 Units into the skin at bedtime.   insulin aspart 100 UNIT/ML injection Commonly known as: novoLOG Inject 0-20 Units into the skin 3 (three) times daily with meals.   Magnesium 250 MG Tabs Take 250 mg by mouth daily.   metoprolol tartrate 50 MG tablet Commonly known as: LOPRESSOR Take 1 tablet (50 mg total) by mouth 2 (two) times daily.   midodrine 10 MG tablet Commonly known as: PROAMATINE Take 1 tablet (10 mg total) by mouth 3 (three) times daily with meals.   mirtazapine 15 MG tablet Commonly known as: REMERON Take 15 mg by mouth at bedtime.  pantoprazole 40 MG tablet Commonly known as: PROTONIX Take 40 mg by mouth daily.   polyethylene glycol 17 g packet Commonly known as: MIRALAX / GLYCOLAX Take 17 g by mouth 2 (two) times daily.   pravastatin 40 MG tablet Commonly known as: PRAVACHOL Take 40 mg by mouth at bedtime.   QUEtiapine 25 MG tablet Commonly known as: SEROQUEL Take 25 mg by mouth 2 (two) times daily.   senna-docusate 8.6-50 MG tablet Commonly known as: Senokot-S Take 1 tablet by mouth 2 (two) times daily.   Vitamin B-12 5000 MCG Tbdp Take 5,000 mcg by mouth daily.        Follow-up Information     [name], MD Follow up in 1 week(s).   Specialty: Internal Medicine Contact information: [phone] [facility] [address] [facility] [place] [address] [phone]         Debbe Odea, MD Follow up in 1 week(s).   Specialties: Cardiology, Radiology Contact information: [address] [place] [address] [phone]                No Known Allergies  Consultations: Cardiology   Procedures/Studies: CT HEAD WO CONTRAST ( )  Result Date: 01/05/2021 CLINICAL DATA:  Syncope and subsequent fall. EXAM: CT HEAD  WITHOUT CONTRAST TECHNIQUE: Contiguous axial images were obtained from the base of the skull through the vertex without intravenous contrast. COMPARISON:  May 05, 2018 FINDINGS: Brain: There is mild cerebral atrophy with widening of the extra-axial spaces and ventricular dilatation. There are areas of decreased attenuation within the white matter tracts of the supratentorial brain, consistent with microvascular disease changes. Vascular: No hyperdense vessel or unexpected calcification. Skull: Hyperostosis frontalis interna is again seen. Negative for fracture or focal lesion. Sinuses/Orbits: No acute finding. Other: None. IMPRESSION: Generalized cerebral atrophy without an acute intracranial abnormality. Electronically Signed   By: Aram Candela M.D.   On: 01/05/2021 20:59   CT Cervical Spine Wo Contrast  Result Date: 01/05/2021 CLINICAL DATA:  Syncope and subsequent fall. EXAM: CT CERVICAL SPINE WITHOUT CONTRAST TECHNIQUE: Multidetector CT imaging of the cervical spine was performed without intravenous contrast. Multiplanar CT image reconstructions were also generated. COMPARISON:  None. FINDINGS: Alignment: Approximally 1 mm to 2 mm retrolisthesis of the C5 vertebral body is noted on C6. Skull base and vertebrae: No acute fracture. Chronic and degenerative changes are seen involving the body and tip of the dens. No primary bone lesion or focal pathologic process. Soft tissues and spinal canal: No prevertebral fluid or swelling. No visible canal hematoma. Disc levels: Marked severity endplate sclerosis is seen at the level of C5-C6 with moderate severity anterior osteophyte formation noted at the levels of C4-C5 and C5-C6. There is moderate severity narrowing of the anterior atlantoaxial articulation. Marked severity intervertebral disc space narrowing is seen at the level of C5-C6. Mild intervertebral disc space narrowing is present throughout the remainder of the cervical spine. Bilateral marked  severity multilevel facet joint hypertrophy is noted. Upper chest: Negative. Other: None. IMPRESSION: 1. Marked severity degenerative changes, most prominent at the level of C5-C6. 2. No acute cervical spine fracture. Electronically Signed   By: Aram Candela M.D.   On: 01/05/2021 21:03   DG Chest Port 1 View  Result Date: 01/05/2021 CLINICAL DATA:  Syncope, fall EXAM: PORTABLE CHEST 1 VIEW COMPARISON:  11/25/2016 FINDINGS: Insert lung No pleural effusion or pneumothorax. The heart is top-normal in size. Degenerative changes of the right shoulder. IMPRESSION: No evidence of acute cardiopulmonary  disease. Electronically Signed   By: Charline Bills M.D.   On: 01/05/2021 20:29   ECHOCARDIOGRAM COMPLETE  Result Date: 01/06/2021    ECHOCARDIOGRAM REPORT   Patient Name:   Loretta Webb Date of Exam: 01/06/2021 Medical Rec #:  [id number]       Height:       63.0 in Accession #:    [id number]      Weight:       152.6 lb Date of Birth:  20-Oct-1931       BSA:          1.723 m Patient Age:    87 years        BP:           149/89 mmHg Patient Gender: F               HR:           118 bpm. Exam Location:  [facility] Procedure: 2D Echo, Color Doppler and Cardiac Doppler Indications:     R55 Syncope  History:         Patient has no prior history of Echocardiogram examinations.                  Arrythmias:Atrial Fibrillation; Risk Factors:Hypertension,                  Diabetes and Sleep Apnea.  Sonographer:     Humphrey Rolls Referring Phys:  [id number] Rometta Emery Diagnosing Phys: Debbe Odea MD  Sonographer Comments: Suboptimal subcostal window. IMPRESSIONS  1. Left ventricular ejection fraction, by estimation, is 50 to 55%. The left ventricle has low normal function. The left ventricle has no regional wall motion abnormalities. Left ventricular diastolic parameters are indeterminate.  2. Right ventricular systolic function is normal. The right ventricular size is normal.  3. Left atrial size was mildly dilated.  4.  The mitral valve is normal in structure. Mild mitral valve regurgitation.  5. The aortic valve is tricuspid. Aortic valve regurgitation is not visualized. Mild aortic valve sclerosis is present, with no evidence of aortic valve stenosis.  6. The inferior vena cava is normal in size with greater than 50% respiratory variability, suggesting right atrial pressure of 3 mmHg. FINDINGS  Left Ventricle: Left ventricular ejection fraction, by estimation, is 50 to 55%. The left ventricle has low normal function. The left ventricle has no regional wall motion abnormalities. The left ventricular internal cavity size was normal in size. There is no left ventricular hypertrophy. Left ventricular diastolic parameters are indeterminate. Right Ventricle: The right ventricular size is normal. No increase in right ventricular wall thickness. Right ventricular systolic function is normal. Left Atrium: Left atrial size was mildly dilated. Right Atrium: Right atrial size was normal in size. Pericardium: There is no evidence of pericardial effusion. Mitral Valve: The mitral valve is normal in structure. Mild mitral valve regurgitation. MV peak gradient, 3.8 mmHg. The mean mitral valve gradient is 1.0 mmHg. Tricuspid Valve: The tricuspid valve is normal in structure. Tricuspid valve regurgitation is mild. Aortic Valve: The aortic valve is tricuspid. Aortic valve regurgitation is not visualized. Mild aortic valve sclerosis is present, with no evidence of aortic valve stenosis. Aortic valve mean gradient measures 3.0 mmHg. Aortic valve peak gradient measures 5.0 mmHg. Aortic valve area, by VTI measures 1.60 cm. Pulmonic Valve: The pulmonic valve was not well visualized. Pulmonic valve regurgitation is trivial. Aorta: The aortic root is normal in size and structure. Venous: The inferior vena  cava is normal in size with greater than 50% respiratory variability, suggesting right atrial pressure of 3 mmHg. IAS/Shunts: No atrial level shunt  detected by color flow Doppler.  LEFT VENTRICLE PLAX 2D LVIDd:         4.69 cm  Diastology LVIDs:         3.39 cm  LV e' medial:    8.38 cm/s LV PW:         1.12 cm  LV E/e' medial:  8.7 LV IVS:        0.94 cm  LV e' lateral:   13.60 cm/s LVOT diam:     2.00 cm  LV E/e' lateral: 5.3 LV SV:         30 LV SV Index:   17 LVOT Area:     3.14 cm  RIGHT VENTRICLE RV Basal diam:  3.97 cm RV S prime:     8.92 cm/s LEFT ATRIUM             Index       RIGHT ATRIUM           Index LA diam:        4.40 cm 2.55 cm/m  RA Area:     18.50 cm LA Vol (A2C):   47.2 ml 27.39 ml/m RA Volume:   52.30 ml  30.35 ml/m LA Vol (A4C):   60.4 ml 35.05 ml/m LA Biplane Vol: 57.4 ml 33.31 ml/m  AORTIC VALVE                   PULMONIC VALVE AV Area (Vmax):    2.18 cm    PV Vmax:       0.78 m/s AV Area (Vmean):   1.86 cm    PV Vmean:      51.700 cm/s AV Area (VTI):     1.60 cm    PV VTI:        0.100 m AV Vmax:           112.00 cm/s PV Peak grad:  2.4 mmHg AV Vmean:          80.400 cm/s PV Mean grad:  1.0 mmHg AV VTI:            0.187 m AV Peak Grad:      5.0 mmHg AV Mean Grad:      3.0 mmHg LVOT Vmax:         77.70 cm/s LVOT Vmean:        47.500 cm/s LVOT VTI:          0.095 m LVOT/AV VTI ratio: 0.51  AORTA Ao Root diam: 3.40 cm MITRAL VALVE               TRICUSPID VALVE MV Area (PHT): 4.24 cm    TR Peak grad:   19.2 mmHg MV Area VTI:   1.74 cm    TR Vmax:        219.00 cm/s MV Peak grad:  3.8 mmHg MV Mean grad:  1.0 mmHg    SHUNTS MV Vmax:       0.97 m/s    Systemic VTI:  0.10 m MV Vmean:      53.2 cm/s   Systemic Diam: 2.00 cm MV Decel Time: 179 msec MV E velocity: 72.65 cm/s Debbe Odea MD Electronically signed by Debbe Odea MD Signature Date/Time: 01/06/2021/12:51:11 PM    Final       Subjective:   Discharge Exam: Vitals:  01/11/21 0731 01/11/21 1145  BP: 140/78 122/71  Pulse: 73 65  Resp: 16 18  Temp: 98.2 F (36.8 C) 97.8 F (36.6 C)  SpO2: 98% 98%   Vitals:   01/11/21 0500 01/11/21 0523 01/11/21  0731 01/11/21 1145  BP:  127/88 140/78 122/71  Pulse:  75 73 65  Resp:  18 16 18   Temp:  98.4 F (36.9 C) 98.2 F (36.8 C) 97.8 F (36.6 C)  TempSrc:   Oral Oral  SpO2:  96% 98% 98%  Weight: 69.3 kg     Height:        General: Pt is alert, awake, not in acute distress Cardiovascular: RRR, S1/S2 +, no rubs, no gallops Respiratory: CTA bilaterally, no wheezing, no rhonchi Abdominal: Soft, NT, ND, bowel sounds + Extremities: no edema, ecchymosis    The results of significant diagnostics from this hospitalization (including imaging, microbiology, ancillary and laboratory) are listed below for reference.     Microbiology: Recent Results (from the past 240 hour(s))  Resp Panel by RT-PCR (Flu A&B, Covid) Nasopharyngeal Swab     Status: None   Collection Time: 01/05/21  7:47 PM   Specimen: Nasopharyngeal Swab; Nasopharyngeal(NP) swabs in vial transport medium  Result Value Ref Range Status   SARS Coronavirus 2 by RT PCR NEGATIVE NEGATIVE Final    Comment: (NOTE) SARS-CoV-2 target nucleic acids are NOT DETECTED.  The SARS-CoV-2 RNA is generally detectable in upper respiratory specimens during the acute phase of infection. The lowest concentration of SARS-CoV-2 viral copies this assay can detect is 138 copies/mL. A negative result does not preclude SARS-Cov-2 infection and should not be used as the sole basis for treatment or other patient management decisions. A negative result may occur with  improper specimen collection/handling, submission of specimen other than nasopharyngeal swab, presence of viral mutation(s) within the areas targeted by this assay, and inadequate number of viral copies(<138 copies/mL). A negative result must be combined with clinical observations, patient history, and epidemiological information. The expected result is Negative.  Fact Sheet for Patients:  BloggerCourse.com  Fact Sheet for Healthcare Providers:   SeriousBroker.it  This test is no t yet approved or cleared by the [place] FDA and  has been authorized for detection and/or diagnosis of SARS-CoV-2 by FDA under an Emergency Use Authorization (EUA). This EUA will remain  in effect (meaning this test can be used) for the duration of the COVID-19 declaration under Section 564(b)(1) of the Act, 21 U.S.C.section 360bbb-3(b)(1), unless the authorization is terminated  or revoked sooner.       Influenza A by PCR NEGATIVE NEGATIVE Final   Influenza B by PCR NEGATIVE NEGATIVE Final    Comment: (NOTE) The Xpert Xpress SARS-CoV-2/FLU/RSV plus assay is intended as an aid in the diagnosis of influenza from Nasopharyngeal swab specimens and should not be used as a sole basis for treatment. Nasal washings and aspirates are unacceptable for Xpert Xpress SARS-CoV-2/FLU/RSV testing.  Fact Sheet for Patients: BloggerCourse.com  Fact Sheet for Healthcare Providers: SeriousBroker.it  This test is not yet approved or cleared by the [place] FDA and has been authorized for detection and/or diagnosis of SARS-CoV-2 by FDA under an Emergency Use Authorization (EUA). This EUA will remain in effect (meaning this test can be used) for the duration of the COVID-19 declaration under Section 564(b)(1) of the Act, 21 U.S.C. section 360bbb-3(b)(1), unless the authorization is terminated or revoked.  Performed at [facility], [address], [place], [place] [address]  Urine Culture     Status: Abnormal   Collection Time: 01/05/21  8:30 PM   Specimen: Urine, Random  Result Value Ref Range Status   Specimen Description   Final    URINE, RANDOM Performed at [facility], [address], [place], [place] [address]    Special Requests   Final    NONE Performed at [facility], [address] [place], [place] [address]    Culture  MULTIPLE SPECIES PRESENT, SUGGEST RECOLLECTION (A)  Final   Report Status 01/07/2021 FINAL  Final  Blood Culture (routine x 2)     Status: None (Preliminary result)   Collection Time: 01/05/21  8:31 PM   Specimen: BLOOD  Result Value Ref Range Status   Specimen Description BLOOD LEFT ASSIST CONTROL  Final   Special Requests   Final    BOTTLES DRAWN AEROBIC AND ANAEROBIC Blood Culture results may not be optimal due to an inadequate volume of blood received in culture bottles   Culture   Final    NO GROWTH 4 DAYS Performed at [facility], [address] [place], [place] [address]    Report Status PENDING  Incomplete  Blood Culture (routine x 2)     Status: None (Preliminary result)   Collection Time: 01/05/21  8:31 PM   Specimen: BLOOD  Result Value Ref Range Status   Specimen Description BLOOD RIGHT HAND  Final   Special Requests   Final    BOTTLES DRAWN AEROBIC AND ANAEROBIC Blood Culture adequate volume   Culture   Final    NO GROWTH 4 DAYS Performed at [facility], [address] [place], [place] [address]    Report Status PENDING  Incomplete  Resp Panel by RT-PCR (Flu A&B, Covid) Nasopharyngeal Swab     Status: None   Collection Time: 01/11/21 10:18 AM   Specimen: Nasopharyngeal Swab; Nasopharyngeal(NP) swabs in vial transport medium  Result Value Ref Range Status   SARS Coronavirus 2 by RT PCR NEGATIVE NEGATIVE Final    Comment: (NOTE) SARS-CoV-2 target nucleic acids are NOT DETECTED.  The SARS-CoV-2 RNA is generally detectable in upper respiratory specimens during the acute phase of infection. The lowest concentration of SARS-CoV-2 viral copies this assay can detect is 138 copies/mL. A negative result does not preclude SARS-Cov-2 infection and should not be used as the sole basis for treatment or other patient management decisions. A negative result may occur with  improper specimen collection/handling, submission of specimen other than  nasopharyngeal swab, presence of viral mutation(s) within the areas targeted by this assay, and inadequate number of viral copies(<138 copies/mL). A negative result must be combined with clinical observations, patient history, and epidemiological information. The expected result is Negative.  Fact Sheet for Patients:  BloggerCourse.com  Fact Sheet for Healthcare Providers:  SeriousBroker.it  This test is no t yet approved or cleared by the [place] FDA and  has been authorized for detection and/or diagnosis of SARS-CoV-2 by FDA under an Emergency Use Authorization (EUA). This EUA will remain  in effect (meaning this test can be used) for the duration of the COVID-19 declaration under Section 564(b)(1) of the Act, 21 U.S.C.section 360bbb-3(b)(1), unless the authorization is terminated  or revoked sooner.       Influenza A by PCR NEGATIVE NEGATIVE Final   Influenza B by PCR NEGATIVE NEGATIVE Final    Comment: (NOTE) The Xpert Xpress SARS-CoV-2/FLU/RSV plus assay is intended as an aid in the diagnosis of influenza  from Nasopharyngeal swab specimens and should not be used as a sole basis for treatment. Nasal washings and aspirates are unacceptable for Xpert Xpress SARS-CoV-2/FLU/RSV testing.  Fact Sheet for Patients: BloggerCourse.com  Fact Sheet for Healthcare Providers: SeriousBroker.it  This test is not yet approved or cleared by the [place] FDA and has been authorized for detection and/or diagnosis of SARS-CoV-2 by FDA under an Emergency Use Authorization (EUA). This EUA will remain in effect (meaning this test can be used) for the duration of the COVID-19 declaration under Section 564(b)(1) of the Act, 21 U.S.C. section 360bbb-3(b)(1), unless the authorization is terminated or revoked.  Performed at [facility], [address] [place], [place]  [address]      Labs: BNP (last 3 results) No results for input(s): BNP in the last 8760 hours. Basic Metabolic Panel: Recent Labs  Lab 01/05/21 1947 01/06/21 0100 01/07/21 0414  NA 136 136  --   K 3.4* 3.6  --   CL 100 101  --   CO2 24 26  --   GLUCOSE 216* 112*  --   BUN 23 21  --   CREATININE 1.23* 1.06* 0.96  CALCIUM 8.8* 9.0  --   MG  --  2.4  --    Liver Function Tests: Recent Labs  Lab 01/05/21 1947 01/06/21 0100  AST 29 27  ALT 12 11  ALKPHOS 41 43  BILITOT 0.6 0.6  PROT 6.2* 6.7  ALBUMIN 3.6 3.8   No results for input(s): LIPASE, AMYLASE in the last 168 hours. No results for input(s): AMMONIA in the last 168 hours. CBC: Recent Labs  Lab 01/05/21 1947 01/06/21 0100 01/07/21 0414  WBC 11.7* 13.2* 10.8*  NEUTROABS 9.0*  --   --   HGB 10.8* 11.2* 10.3*  HCT 33.8* 34.2* 32.7*  MCV 79.7* 79.0* 81.3  PLT 344 353 317   Cardiac Enzymes: No results for input(s): CKTOTAL, CKMB, CKMBINDEX, TROPONINI in the last 168 hours. BNP: Invalid input(s): POCBNP CBG: Recent Labs  Lab 01/10/21 1731 01/10/21 2225 01/11/21 0450 01/11/21 0730 01/11/21 1141  GLUCAP 117* 127* 136* 137* 98   D-Dimer No results for input(s): DDIMER in the last 72 hours. Hgb A1c No results for input(s): HGBA1C in the last 72 hours. Lipid Profile No results for input(s): CHOL, HDL, LDLCALC, TRIG, CHOLHDL, LDLDIRECT in the last 72 hours. Thyroid function studies No results for input(s): TSH, T4TOTAL, T3FREE, THYROIDAB in the last 72 hours.  Invalid input(s): FREET3 Anemia work up No results for input(s): VITAMINB12, FOLATE, FERRITIN, TIBC, IRON, RETICCTPCT in the last 72 hours. Urinalysis    Component Value Date/Time   COLORURINE YELLOW (A) 01/05/2021 2030   APPEARANCEUR CLEAR (A) 01/05/2021 2030   LABSPEC 1.016 01/05/2021 2030   PHURINE 7.0 01/05/2021 2030   GLUCOSEU NEGATIVE 01/05/2021 2030   HGBUR NEGATIVE 01/05/2021 2030   BILIRUBINUR NEGATIVE 01/05/2021 2030   KETONESUR  NEGATIVE 01/05/2021 2030   PROTEINUR NEGATIVE 01/05/2021 2030   NITRITE NEGATIVE 01/05/2021 2030   LEUKOCYTESUR TRACE (A) 01/05/2021 2030   Sepsis Labs Invalid input(s): PROCALCITONIN,  WBC,  LACTICIDVEN Microbiology Recent Results (from the past 240 hour(s))  Resp Panel by RT-PCR (Flu A&B, Covid) Nasopharyngeal Swab     Status: None   Collection Time: 01/05/21  7:47 PM   Specimen: Nasopharyngeal Swab; Nasopharyngeal(NP) swabs in vial transport medium  Result Value Ref Range Status   SARS Coronavirus 2 by RT PCR NEGATIVE NEGATIVE Final    Comment: (NOTE) SARS-CoV-2 target  nucleic acids are NOT DETECTED.  The SARS-CoV-2 RNA is generally detectable in upper respiratory specimens during the acute phase of infection. The lowest concentration of SARS-CoV-2 viral copies this assay can detect is 138 copies/mL. A negative result does not preclude SARS-Cov-2 infection and should not be used as the sole basis for treatment or other patient management decisions. A negative result may occur with  improper specimen collection/handling, submission of specimen other than nasopharyngeal swab, presence of viral mutation(s) within the areas targeted by this assay, and inadequate number of viral copies(<138 copies/mL). A negative result must be combined with clinical observations, patient history, and epidemiological information. The expected result is Negative.  Fact Sheet for Patients:  BloggerCourse.com  Fact Sheet for Healthcare Providers:  SeriousBroker.it  This test is no t yet approved or cleared by the [place] FDA and  has been authorized for detection and/or diagnosis of SARS-CoV-2 by FDA under an Emergency Use Authorization (EUA). This EUA will remain  in effect (meaning this test can be used) for the duration of the COVID-19 declaration under Section 564(b)(1) of the Act, 21 U.S.C.section 360bbb-3(b)(1), unless the authorization  is terminated  or revoked sooner.       Influenza A by PCR NEGATIVE NEGATIVE Final   Influenza B by PCR NEGATIVE NEGATIVE Final    Comment: (NOTE) The Xpert Xpress SARS-CoV-2/FLU/RSV plus assay is intended as an aid in the diagnosis of influenza from Nasopharyngeal swab specimens and should not be used as a sole basis for treatment. Nasal washings and aspirates are unacceptable for Xpert Xpress SARS-CoV-2/FLU/RSV testing.  Fact Sheet for Patients: BloggerCourse.com  Fact Sheet for Healthcare Providers: SeriousBroker.it  This test is not yet approved or cleared by the [place] FDA and has been authorized for detection and/or diagnosis of SARS-CoV-2 by FDA under an Emergency Use Authorization (EUA). This EUA will remain in effect (meaning this test can be used) for the duration of the COVID-19 declaration under Section 564(b)(1) of the Act, 21 U.S.C. section 360bbb-3(b)(1), unless the authorization is terminated or revoked.  Performed at [facility], [address], [place], [place] [address]   Urine Culture     Status: Abnormal   Collection Time: 01/05/21  8:30 PM   Specimen: Urine, Random  Result Value Ref Range Status   Specimen Description   Final    URINE, RANDOM Performed at [facility], [address] [place], [place] [address]    Special Requests   Final    NONE Performed at [facility], [address] [place], [place] [address]    Culture MULTIPLE SPECIES PRESENT, SUGGEST RECOLLECTION (A)  Final   Report Status 01/07/2021 FINAL  Final  Blood Culture (routine x 2)     Status: None (Preliminary result)   Collection Time: 01/05/21  8:31 PM   Specimen: BLOOD  Result Value Ref Range Status   Specimen Description BLOOD LEFT ASSIST CONTROL  Final   Special Requests   Final    BOTTLES DRAWN AEROBIC AND ANAEROBIC Blood Culture results may not be optimal due to an inadequate volume  of blood received in culture bottles   Culture   Final    NO GROWTH 4 DAYS Performed at [facility], [address] [place], [place] [address]    Report Status PENDING  Incomplete  Blood Culture (routine x 2)     Status: None (Preliminary result)   Collection Time: 01/05/21  8:31 PM   Specimen: BLOOD  Result Value  Ref Range Status   Specimen Description BLOOD RIGHT HAND  Final   Special Requests   Final    BOTTLES DRAWN AEROBIC AND ANAEROBIC Blood Culture adequate volume   Culture   Final    NO GROWTH 4 DAYS Performed at [facility], [address] [place], [place] [address]    Report Status PENDING  Incomplete  Resp Panel by RT-PCR (Flu A&B, Covid) Nasopharyngeal Swab     Status: None   Collection Time: 01/11/21 10:18 AM   Specimen: Nasopharyngeal Swab; Nasopharyngeal(NP) swabs in vial transport medium  Result Value Ref Range Status   SARS Coronavirus 2 by RT PCR NEGATIVE NEGATIVE Final    Comment: (NOTE) SARS-CoV-2 target nucleic acids are NOT DETECTED.  The SARS-CoV-2 RNA is generally detectable in upper respiratory specimens during the acute phase of infection. The lowest concentration of SARS-CoV-2 viral copies this assay can detect is 138 copies/mL. A negative result does not preclude SARS-Cov-2 infection and should not be used as the sole basis for treatment or other patient management decisions. A negative result may occur with  improper specimen collection/handling, submission of specimen other than nasopharyngeal swab, presence of viral mutation(s) within the areas targeted by this assay, and inadequate number of viral copies(<138 copies/mL). A negative result must be combined with clinical observations, patient history, and epidemiological information. The expected result is Negative.  Fact Sheet for Patients:  BloggerCourse.com  Fact Sheet for Healthcare Providers:  SeriousBroker.it  This  test is no t yet approved or cleared by the [place] FDA and  has been authorized for detection and/or diagnosis of SARS-CoV-2 by FDA under an Emergency Use Authorization (EUA). This EUA will remain  in effect (meaning this test can be used) for the duration of the COVID-19 declaration under Section 564(b)(1) of the Act, 21 U.S.C.section 360bbb-3(b)(1), unless the authorization is terminated  or revoked sooner.       Influenza A by PCR NEGATIVE NEGATIVE Final   Influenza B by PCR NEGATIVE NEGATIVE Final    Comment: (NOTE) The Xpert Xpress SARS-CoV-2/FLU/RSV plus assay is intended as an aid in the diagnosis of influenza from Nasopharyngeal swab specimens and should not be used as a sole basis for treatment. Nasal washings and aspirates are unacceptable for Xpert Xpress SARS-CoV-2/FLU/RSV testing.  Fact Sheet for Patients: BloggerCourse.com  Fact Sheet for Healthcare Providers: SeriousBroker.it  This test is not yet approved or cleared by the [place] FDA and has been authorized for detection and/or diagnosis of SARS-CoV-2 by FDA under an Emergency Use Authorization (EUA). This EUA will remain in effect (meaning this test can be used) for the duration of the COVID-19 declaration under Section 564(b)(1) of the Act, 21 U.S.C. section 360bbb-3(b)(1), unless the authorization is terminated or revoked.  Performed at [facility], [address] [place], [place] [address]      Time coordinating discharge: Over 30 minutes  SIGNED:   Lynn Ito, MD  [facility] 01/11/2021, 12:39 PM Pager   If 7PM-7AM, please contact night-coverage [email or web address] Password [email or web address]

## 2021-01-11 NOTE — TOC Progression Note (Signed)
Transition of Care [facility]) - Progression Note    Patient Details  Name: ADYSON VANBUREN MRN: [id number] Date of Birth: 01/22/1932  Transition of Care [facility]) CM/SW Contact  Allayne Butcher, RN Phone Number: 01/11/2021, 1:14 PM  Clinical Narrative:    [facility] admissions is closed today and not accepting admits to facility.  Family has asked that [facility] check with [facility] and [facility] in [place] has patient has daughters in [place].  Referrals faxed to both and messages left for admissions at both.     Expected Discharge Plan: Skilled Nursing Facility Barriers to Discharge: Continued Medical Work up  Expected Discharge Plan and Services Expected Discharge Plan: Skilled Nursing Facility   Discharge Planning Services: CM Consult Post Acute Care Choice: Skilled Nursing Facility Living arrangements for the past 2 months: Single Family Home Expected Discharge Date: 01/11/21               DME Arranged: N/A DME Agency: NA       HH Arranged: NA           Social Determinants of Health (SDOH) Interventions    Readmission Risk Interventions No flowsheet data found.

## 2021-01-12 DIAGNOSIS — I10 Essential (primary) hypertension: Secondary | ICD-10-CM

## 2021-01-12 LAB — GLUCOSE, CAPILLARY
Glucose-Capillary: 140 mg/dL — ABNORMAL HIGH (ref 70–99)
Glucose-Capillary: 148 mg/dL — ABNORMAL HIGH (ref 70–99)
Glucose-Capillary: 173 mg/dL — ABNORMAL HIGH (ref 70–99)

## 2021-01-12 LAB — CULTURE, BLOOD (ROUTINE X 2)
Culture: NO GROWTH
Culture: NO GROWTH
Special Requests: ADEQUATE

## 2021-01-12 MED ORDER — DICLOFENAC SODIUM 1 % EX GEL: 2.0000 g | Freq: Four times a day (QID) | CUTANEOUS | 0 refills | Status: AC

## 2021-01-12 NOTE — Progress Notes (Signed)
Patient being discharged to [facility] room 2213. Called report to Victoria Vera.

## 2021-01-12 NOTE — Progress Notes (Signed)
Physical Therapy Treatment Patient Details Name: Loretta Webb MRN: [id number] DOB: 11-Jan-1932 Today's Date: 01/12/2021    History of Present Illness Loretta Webb is a 87 y.o. female with medical history significant of atrial fibrillation, chronic pain syndrome, osteoarthritis, diabetes type 2, GERD, fibromyalgia, history of esophageal stricture, essential hypertension and obstructive sleep apnea who was brought in from home after a witnessed syncopal episode.    PT Comments    Pt received seated in recliner agreeable to PT services. Pt progressing in PT with ability to perform x2 bouts of ambulation with RW with seated rest in between. X1 bout 20', x2  bout ~30'. Overall pt relies on supervision for transfers and ambulation at this date. Pt still remains limited with RR from 37-40 and HR uin a-fib ranging from 107-120 BPM. Pt however does not visibly appear SOB and states ambulation bout was "easy". Pt does require mod VC's for RW sequencing with turning and transferring back to seated for safety. Pt educated on seated there.ex with good form/technique noted with min VC's throughout. D/c recs remain appropriate to optimize strength and endurance prior to transitioning back to home environment.    Follow Up Recommendations  SNF     Equipment Recommendations  Other (comment)    Recommendations for Other Services       Precautions / Restrictions Precautions Precautions: Fall Restrictions Weight Bearing Restrictions: No    Mobility  Bed Mobility               General bed mobility comments: Received and returned to recliner. Patient Response: Cooperative  Transfers Overall transfer level: Needs assistance Equipment used: Rolling walker (2 wheeled) Transfers: Sit to/from Stand Sit to Stand: Supervision         General transfer comment: Performed with min VC's for safe hand placement  Ambulation/Gait Ambulation/Gait assistance: Supervision Gait Distance (Feet): 50  Feet Assistive device: Rolling walker (2 wheeled) Gait Pattern/deviations: Step-through pattern     General Gait Details: Remains elevated RR up to 37-40. Does not visibly appear SOB.   Stairs             Wheelchair Mobility    Modified Rankin (Stroke Patients Only)       Balance Overall balance assessment: Needs assistance Sitting-balance support: No upper extremity supported;Feet supported Sitting balance-Leahy Scale: Good     Standing balance support: Bilateral upper extremity supported;During functional activity Standing balance-Leahy Scale: Fair Standing balance comment: Able to stand with light touch on RW without physical PT assist.                            Cognition Arousal/Alertness: Awake/alert Behavior During Therapy: WFL for tasks assessed/performed Overall Cognitive Status: Within Functional Limits for tasks assessed                                        Exercises General Exercises - Lower Extremity Ankle Circles/Pumps: AROM;Supine;Both;10 reps Long Arc Quad: AROM;Strengthening;Both;20 reps;Seated Hip ABduction/ADduction: AROM;Strengthening;Both;20 reps;Seated Hip Flexion/Marching: AROM;Both;20 reps;Seated Toe Raises: AROM;Strengthening;Both;20 reps;Seated Heel Raises: AROM;20 reps;Strengthening;Both;Seated    General Comments        Pertinent Vitals/Pain Pain Assessment: No/denies pain    Home Living                      Prior Function  PT Goals (current goals can now be found in the care plan section) Acute Rehab PT Goals Patient Stated Goal: to go to rehab PT Goal Formulation: With patient Time For Goal Achievement: 01/22/21 Potential to Achieve Goals: Good Progress towards PT goals: Progressing toward goals    Frequency    Min 2X/week      PT Plan Current plan remains appropriate    Co-evaluation              AM-PAC PT "6 Clicks" Mobility   Outcome Measure   Help needed turning from your back to your side while in a flat bed without using bedrails?: A Lot Help needed moving from lying on your back to sitting on the side of a flat bed without using bedrails?: A Little Help needed moving to and from a bed to a chair (including a wheelchair)?: A Little Help needed standing up from a chair using your arms (e.g., wheelchair or bedside chair)?: A Little Help needed to walk in hospital room?: A Little Help needed climbing 3-5 steps with a railing? : A Lot 6 Click Score: 16    End of Session Equipment Utilized During Treatment: Gait belt Activity Tolerance: Patient tolerated treatment well Patient left: with call bell/phone within reach;with bed alarm set;with nursing/sitter in room;in chair Nurse Communication: Mobility status PT Visit Diagnosis: Muscle weakness (generalized) (M62.81)     Time: 0240-9735 PT Time Calculation (min) (ACUTE ONLY): 26 min  Charges:  $Gait Training: 8-22 mins $Therapeutic Exercise: 8-22 mins                    Delphia Grates. Fairly IV, PT, DPT Physical Therapist- [place]  [facility]  01/12/2021, 10:43 AM

## 2021-01-12 NOTE — TOC Transition Note (Signed)
Transition of Care [facility]) - CM/SW Discharge Note   Patient Details  Name: SIA GABRIELSEN MRN: [id number] Date of Birth: 1931-08-26  Transition of Care [facility]) CM/SW Contact:  Allayne Butcher, RN Phone Number: 01/12/2021, 10:59 AM   Clinical Narrative:    Patient is medically cleared for discharge today.  Family has decided on [facility] in [place].  [facility] can offer a private room and the family is aware that it will be $65/ day for the private room, insurance covers everything else.  Patient will transport via [facility], scheduled for 2pm.  DC summary and orders faxed to [facility] at [phone].     Final next level of care: Skilled Nursing Facility Barriers to Discharge: Barriers Resolved   Patient Goals and CMS Choice Patient states their goals for this hospitalization and ongoing recovery are:: Family chooses [facility] CMS Medicare.gov Compare Post Acute Care list provided to:: Patient Represenative (must comment) Choice offered to / list presented to : Adult Children  Discharge Placement              Patient chooses bed at: [facility] Patient to be transferred to facility by: [facility] Name of family member notified: Molly Maduro Patient and family notified of of transfer: 01/12/21  Discharge Plan and Services   Discharge Planning Services: CM Consult Post Acute Care Choice: Skilled Nursing Facility          DME Arranged: N/A DME Agency: NA       HH Arranged: NA          Social Determinants of Health (SDOH) Interventions     Readmission Risk Interventions No flowsheet data found.

## 2021-01-12 NOTE — Care Management Important Message (Signed)
Important Message  Patient Details  Name: Loretta Webb MRN: [id number] Date of Birth: Dec 12, 1931   Medicare Important Message Given:  Yes     Olegario Messier A Emma Schupp 01/12/2021, 12:04 PM

## 2021-01-12 NOTE — Discharge Summary (Signed)
Physician Discharge Summary  Patient ID: Loretta Webb MRN: [id number] DOB/AGE: Aug 03, 1931 87 y.o.  Admit date: 01/05/2021 Discharge date: 01/12/2021  Admission Diagnoses:  Discharge Diagnoses:  Principal Problem:   Syncope and collapse Active Problems:   Benign essential HTN   OSA (obstructive sleep apnea)   DM type 2 with diabetic mixed hyperlipidemia (HCC)   Diabetic peripheral neuropathy (HCC)   Atrial fibrillation with rapid ventricular response (HCC)   Dehydration   Hypotension   Hypokalemia   Lactic acidosis Chronic atrial fibrillation with RVR. Hypotension. Constipation. Obstructive sleep apnea. Sepsis ruled out.  Discharged Condition: good  Hospital Course:  Please refer to discharge summary performed by Dr. Marylu Lund yesterday for hospital course.  Currently, patient does not have any complaints.  She she has a bed available in the nursing home, she is medically stable to be transferred.  Consults:   Significant Diagnostic Studies:   Treatments: Beta blocker, digoxin  Discharge Exam: Blood pressure (!) 145/89, pulse 88, temperature 98.2 F (36.8 C), temperature source Oral, resp. rate (!) 22, height 5\' 3"  (1.6 m), weight 66.3 kg, SpO2 (!) 89 %. General appearance: alert and cooperative Resp: clear to auscultation bilaterally Cardio: irregular, no murmur GI: soft, non-tender; bowel sounds normal; no masses,  no organomegaly Extremities: extremities normal, atraumatic, no cyanosis or edema  Disposition: Discharge disposition: 03-Skilled Nursing Facility       Discharge Instructions     Call MD for:  difficulty breathing, headache or visual disturbances   Complete by: As directed    Call MD for:  persistant dizziness or light-headedness   Complete by: As directed    Diet - low sodium heart healthy   Complete by: As directed    Diet - low sodium heart healthy   Complete by: As directed    Increase activity slowly   Complete by: As directed     Increase activity slowly   Complete by: As directed       Allergies as of 01/12/2021   No Known Allergies      Medication List     STOP taking these medications    bumetanide 1 MG tablet Commonly known as: BUMEX   metFORMIN 1000 MG tablet Commonly known as: GLUCOPHAGE       TAKE these medications    acetaminophen 500 MG tablet Commonly known as: TYLENOL Take 500 mg by mouth 2 (two) times daily as needed.   aspirin 81 MG chewable tablet Chew by mouth daily.   Cholecalciferol 25 MCG (1000 UT) tablet Take 1,000 Units by mouth once a week.   diclofenac Sodium 1 % Gel Commonly known as: VOLTAREN Apply topically 4 (four) times daily.   digoxin 0.125 MG tablet Commonly known as: LANOXIN Take 1 tablet (0.125 mg total) by mouth daily.   donepezil 10 MG tablet Commonly known as: ARICEPT Take 10 mg by mouth daily.   DULoxetine 60 MG capsule Commonly known as: CYMBALTA Take 60 mg by mouth daily.   insulin aspart 100 UNIT/ML injection Commonly known as: novoLOG Inject 0-5 Units into the skin at bedtime.   insulin aspart 100 UNIT/ML injection Commonly known as: novoLOG Inject 0-20 Units into the skin 3 (three) times daily with meals.   Magnesium 250 MG Tabs Take 250 mg by mouth daily.   metoprolol tartrate 50 MG tablet Commonly known as: LOPRESSOR Take 1 tablet (50 mg total) by mouth 2 (two) times daily.   midodrine 10 MG tablet Commonly known as: PROAMATINE Take 1 tablet (  10 mg total) by mouth 3 (three) times daily with meals.   mirtazapine 15 MG tablet Commonly known as: REMERON Take 15 mg by mouth at bedtime.   pantoprazole 40 MG tablet Commonly known as: PROTONIX Take 40 mg by mouth daily.   polyethylene glycol 17 g packet Commonly known as: MIRALAX / GLYCOLAX Take 17 g by mouth 2 (two) times daily.   pravastatin 40 MG tablet Commonly known as: PRAVACHOL Take 40 mg by mouth at bedtime.   QUEtiapine 25 MG tablet Commonly known as:  SEROQUEL Take 25 mg by mouth 2 (two) times daily.   senna-docusate 8.6-50 MG tablet Commonly known as: Senokot-S Take 1 tablet by mouth 2 (two) times daily.   Vitamin B-12 5000 MCG Tbdp Take 5,000 mcg by mouth daily.        Follow-up Information     [name], MD Follow up in 1 week(s).   Specialty: Internal Medicine Contact information: [phone] [facility] [address] [facility] [place] [address] [phone]         Debbe Odea, MD Follow up in 1 week(s).   Specialties: Cardiology, Radiology Contact information: [address] [place] [address] [phone]                 Signed: Marrion Coy 01/12/2021, 10:29 AM

## 2021-01-28 NOTE — Telephone Encounter (Signed)
Closed

## 2021-02-01 ENCOUNTER — Telehealth: Payer: Self-pay | Admitting: Student

## 2021-02-01 NOTE — Telephone Encounter (Signed)
Rec'd call from daughter, Arbutus Leas, requesting to schedule a Palliative f/u visit with NP, this was scheduled for 02/11/21 @ 2:15 PM.

## 2021-02-06 NOTE — Progress Notes (Signed)
Cardiology Office Note  Date:  02/08/2021   ID:  Loretta Webb, DOB [birth year], MRN [id number]  PCP:  [name], MD   Chief Complaint  Patient presents with   Follow-up    Hospital f/u  Atrial fibrillation   HPI:  Loretta Webb is a 87 year old woman with past medical history of Permanent atrial fibrillation Diabetes type 2 Fibromyalgia/chronic fatigue/sleep apnea/anxiety dementia Hospital admission January 06, 2021 for falls, syncope, atrial fibrillation, hypertension Who presents to establish care in the [place] office for her atrial fibrillation  Seen in the hospital, aug 2021  taken herself off medications at the time including Eliquis, carvedilol, digoxin,  presented with atrial fibrillation with RVR rate 130 bpm  At home prior to admission:  1 fall witnessed by PT, second fall/syncope unwitnessed On arrival to the emergency room blood pressure low 90 systolic,dehydration Improvement of blood pressure with IV fluids concern for sepsis and she was started on broad-spectrum antibiotics urine culture negative, blood culture so far negative, no pneumonia on chest x-ray  At the time of discharge she was started on midodrine for low blood pressure, Digoxin for rate control with metoprolol  Digoxin level 1.7 three weeks ago Unclear if any medication changes were made for that digoxin level  On today's visit relatively nonverbal, presents with her son who is visiting from [place] Patient's daughter does the medications but they did not bring a list with them today He insists that she is not on insulin He reports that they stopped insulin, "sugars are not that bad"  Does not drink much fluids all day  EKG personally reviewed by myself on todays visit Atrial fibrillation rate 72 bpm diffuse T wave abnormality   PMH:   has a past medical history of Arthritis, Chronic fatigue, DM2 (diabetes mellitus, type 2) (HCC), Esophageal stricture, Fibromyalgia, GERD  (gastroesophageal reflux disease), Hypertension, Lumbar stenosis, Osteoporosis, and Sleep apnea.  PSH:    Past Surgical History:  Procedure Laterality Date   ABDOMINAL HYSTERECTOMY     APPENDECTOMY     knee replacements     ROTATOR CUFF REPAIR      Current Outpatient Medications  Medication Sig Dispense Refill   acetaminophen (TYLENOL) 500 MG tablet Take 500 mg by mouth 2 (two) times daily as needed.     aspirin 81 MG chewable tablet Chew by mouth daily.     Cholecalciferol 25 MCG (1000 UT) tablet Take 1,000 Units by mouth once a week.     Cyanocobalamin (VITAMIN B-12) 5000 MCG TBDP Take 5,000 mcg by mouth daily.     diclofenac Sodium (VOLTAREN) 1 % GEL Apply 2 g topically 4 (four) times daily. 50 g 0   digoxin (LANOXIN) 0.125 MG tablet Take 1 tablet (0.125 mg total) by mouth daily.     donepezil (ARICEPT) 10 MG tablet Take 10 mg by mouth daily.     DULoxetine (CYMBALTA) 60 MG capsule Take 60 mg by mouth daily.      insulin aspart (NOVOLOG) 100 UNIT/ML injection Inject 0-5 Units into the skin at bedtime. 10 mL 11   insulin aspart (NOVOLOG) 100 UNIT/ML injection Inject 0-20 Units into the skin 3 (three) times daily with meals. 10 mL 11   Magnesium 250 MG TABS Take 250 mg by mouth daily.     metoprolol tartrate (LOPRESSOR) 50 MG tablet Take 1 tablet (50 mg total) by mouth 2 (two) times daily.     midodrine (PROAMATINE) 10 MG tablet Take 1 tablet (10  mg total) by mouth 3 (three) times daily with meals.     mirtazapine (REMERON) 15 MG tablet Take 15 mg by mouth at bedtime.     pantoprazole (PROTONIX) 40 MG tablet Take 40 mg by mouth daily.     polyethylene glycol (MIRALAX / GLYCOLAX) 17 g packet Take 17 g by mouth 2 (two) times daily. 14 each 0   pravastatin (PRAVACHOL) 40 MG tablet Take 40 mg by mouth at bedtime.      QUEtiapine (SEROQUEL) 25 MG tablet Take 25 mg by mouth 2 (two) times daily.     senna-docusate (SENOKOT-S) 8.6-50 MG tablet Take 1 tablet by mouth 2 (two) times daily.      No current facility-administered medications for this visit.     Allergies:   Patient has no known allergies.   Social History:  The patient  reports that she has never smoked. She has never used smokeless tobacco. She reports that she does not drink alcohol and does not use drugs.   Family History:   family history includes Cancer in her father, mother, and sister; Cerebral aneurysm in her father; Depression in her sister; Diabetes in her father.    Review of Systems: Review of Systems  Constitutional: Negative.   HENT: Negative.    Respiratory: Negative.    Cardiovascular: Negative.   Gastrointestinal: Negative.   Musculoskeletal: Negative.   Neurological: Negative.   Psychiatric/Behavioral: Negative.    All other systems reviewed and are negative.   PHYSICAL EXAM: VS:  BP 92/66   Pulse 72   Ht 5\' 3"  (1.6 m)   Wt 148 lb 3.2 oz (67.2 kg)   LMP  (LMP Unknown)   SpO2 97%   BMI 26.25 kg/m  , BMI Body mass index is 26.25 kg/m. GEN: Well nourished, well developed, in no acute distress Relatively nonverbal but pleasant HEENT: normal Neck: no JVD, carotid bruits, or masses Cardiac: RRR; no murmurs, rubs, or gallops,no edema  Respiratory:  clear to auscultation bilaterally, normal work of breathing GI: soft, nontender, nondistended, + BS MS: no deformity or atrophy Skin: warm and dry, no rash Neuro:  Strength and sensation are intact Psych: euthymic mood, full affect   Recent Labs: 01/06/2021: ALT 11; BUN 21; Magnesium 2.4; Potassium 3.6; Sodium 136 01/07/2021: Creatinine, Ser 0.96; Hemoglobin 10.3; Platelets 317; TSH 3.528    Lipid Panel No results found for: CHOL, HDL, LDLCALC, TRIG    Wt Readings from Last 3 Encounters:  02/08/21 148 lb 3.2 oz (67.2 kg)  01/12/21 146 lb 2.6 oz (66.3 kg)  12/02/20 140 lb (63.5 kg)       ASSESSMENT AND PLAN:  Problem List Items Addressed This Visit       Cardiology Problems   Benign essential HTN   DM type 2 with  diabetic mixed hyperlipidemia (HCC)   Atrial fibrillation with rapid ventricular response (HCC) - Primary   Hyperlipidemia, mixed   Atrial fibrillation, persistent Not a good candidate for anticoagulation given frequent falls, hypovolemia, orthostasis, We will continue metoprolol tartrate 50 twice daily for now with close eye on the blood pressure We will change digoxin to 0.125 mcg half dose daily On full dose digoxin level 1.7 If with fluid hydration and midodrine blood pressure continues to run low and she has near syncope/orthostasis, we may need to decrease metoprolol to tartrate to 25 twice daily  Hypotension Discuss timing of midodrine 6 AM, 11 AM, 3 PM preferably She goes to bed at 7 PM and awake  early Recommended she push fluids.  Currently not drinking much likely contributing to low blood pressure  Diabetes type 2 Son reports she is not on insulin Suspect she is not eating as much, at risk of weight loss Has follow-up with Dr. Hyacinth Meeker next week  Hyperlipidemia On statin, we can readdress this in follow-up   Total encounter time more than 35 minutes  Greater than 50% was spent in counseling and coordination of care with the patient    Signed, Dossie Arbour, M.D., Ph.D. [facility] [place], [place] [phone]

## 2021-02-08 ENCOUNTER — Other Ambulatory Visit: Payer: Self-pay

## 2021-02-08 ENCOUNTER — Encounter: Payer: Self-pay | Admitting: Cardiovascular Disease

## 2021-02-08 ENCOUNTER — Ambulatory Visit (INDEPENDENT_AMBULATORY_CARE_PROVIDER_SITE_OTHER): Payer: Medicare Other | Admitting: Cardiovascular Disease

## 2021-02-08 VITALS — BP 92/66 | HR 72 | Ht 63.0 in | Wt 148.2 lb

## 2021-02-08 DIAGNOSIS — E1169 Type 2 diabetes mellitus with other specified complication: Secondary | ICD-10-CM

## 2021-02-08 DIAGNOSIS — I10 Essential (primary) hypertension: Secondary | ICD-10-CM

## 2021-02-08 DIAGNOSIS — I4891 Unspecified atrial fibrillation: Secondary | ICD-10-CM | POA: Diagnosis not present

## 2021-02-08 DIAGNOSIS — E782 Mixed hyperlipidemia: Secondary | ICD-10-CM | POA: Diagnosis not present

## 2021-02-08 MED ORDER — DIGOXIN 125 MCG PO TABS: 0.0625 mg | ORAL_TABLET | Freq: Every day | ORAL | 3 refills | Status: AC

## 2021-02-08 MED ORDER — MIDODRINE HCL 10 MG PO TABS: 10.0000 mg | ORAL_TABLET | Freq: Three times a day (TID) | ORAL | 3 refills | Status: DC

## 2021-02-08 NOTE — Patient Instructions (Addendum)
Push fluids (stay hydrated)  Medication Instructions:  Please change  Digoxin 1/2 pill daily  Midodrine  three times a day for low pressure (6 Am, 11 Am , 3-4 pm) ok to take without food  Monitor blood pressure, For low pressures after increasing fluids, we may need to decrease the dose of the metoprolol CAll the office if pressures low   If you need a refill on your cardiac medications before your next appointment, please call your pharmacy.   Lab work: No new labs needed  Testing/Procedures: No new testing needed   Follow-Up: At [facility], you and your health needs are our priority.  As part of our continuing mission to provide you with exceptional heart care, we have created designated Provider Care Teams.  These Care Teams include your primary Cardiologist (physician) and Advanced Practice Providers (APPs -  Physician Assistants and Nurse Practitioners) who all work together to provide you with the care you need, when you need it.  You will need a follow up appointment in 1 month  Providers on your designated Care Team:   Nicolasa Ducking, NP Eula Listen, PA-C Marisue Ivan, PA-C Cadence [place], [place]  COVID-19 Vaccine Information can be found at: [email or web address] For questions related to vaccine distribution or appointments, please email [email or web address] or call [phone].

## 2021-02-11 ENCOUNTER — Other Ambulatory Visit: Payer: Self-pay

## 2021-02-11 ENCOUNTER — Other Ambulatory Visit: Payer: Medicare Other | Admitting: Student

## 2021-02-11 DIAGNOSIS — K5900 Constipation, unspecified: Secondary | ICD-10-CM

## 2021-02-11 DIAGNOSIS — R531 Weakness: Secondary | ICD-10-CM

## 2021-02-11 DIAGNOSIS — F03A3 Unspecified dementia, mild, with mood disturbance: Secondary | ICD-10-CM

## 2021-02-11 DIAGNOSIS — Z515 Encounter for palliative care: Secondary | ICD-10-CM

## 2021-02-11 NOTE — Progress Notes (Signed)
[facility] Palliative Care Consult Note Telephone: [phone]  Fax: [phone]    Date of encounter: 02/11/21 2:29 PM PATIENT NAME: Loretta Webb [place] [id number]   [phone] (home)  DOB: 16-Dec-1931 MRN: [id number] PRIMARY CARE PROVIDER:    Rusty Aus, MD,  [place] [address] [phone]  REFERRING PROVIDER:   Rusty Aus, MD [facility] [place],  [place] [address] [phone]  RESPONSIBLE PARTY:    Contact Information     Name Relation Home Work Mobile   Lockery,Debbie Daughter [phone]     Samirah, Scarpati   [phone]        I met face to face with patient and family in the home. Palliative Care was asked to follow this patient by consultation request of  Rusty Aus, MD to address advance care planning and complex medical decision making. This is a follow up visit.                                   ASSESSMENT AND PLAN / RECOMMENDATIONS:   Advance Care Planning/Goals of Care: Goals include to maximize quality of life and symptom management.   CODE STATUS: Full Code  Symptom Management/Plan:  Generalized weakness- continue PT as directed. Encourage walker with ambulation.  Dementia- continue donepezil as directed. Family and caregivers to continue providing supportive care. Recommend consistent routine. Family sets up medication boxes. Monitor for falls/safety.   Constipation-continue MiraLAX and Colace as directed.  Follow up Palliative Care Visit: Palliative care will continue to follow for complex medical decision making, advance care planning, and clarification of goals. Return in 8 weeks or prn.  I spent 40 minutes providing this consultation. More than 50% of the time in this consultation was spent in counseling and care coordination.   PPS: 50%  HOSPICE  ELIGIBILITY/DIAGNOSIS: TBD  Chief Complaint: Palliative medicine follow up viist.   HISTORY OF PRESENT ILLNESS:  GRAYSON WHITE is a 87 y.o. year old female  with  dementia, OA, atrial fibrillation, hypertension, T2DM, anxiety, depression, rotator cuff syndrome, lumbar stenosis, osteoporosis. Patient hospitalized 8/30 through 01/12/21 due to syncope and collapse, atrial fibrillation with RVR, hypotension, lactic acidosis. Patient was discharged to [facility] for Skilled therapy.   Patient currently receiving PT twice a week since returning home. Family has been working with [employer] for in home caregivers. Son Herbie [place] is currently visiting. She has a daughter that provides daily meals; appetite improving.  Patient states that she feels she needs additional reassurance in the home. She is using walker occasionally.  She is able to complete bathing and dressing and grooming needs.  She does endorse weakness. Her pain is better to shoulders. She denies shortness of breath, nausea. She does endorse constipation; she uses MiraLAX and Colace. She states she has been sleeping well. Son states patient has been complaining of runny nose, nasal congestion.  Patient states this is improved greatly since returning home. Patient seen by cardiology yesterday to follow-up on atrial fibrillation. Her digoxin was changed to every Monday Wednesday Friday dosing. She is to continue her metoprolol twice daily, she is to continue midodrine as directed with plans to wean down. A 10-point review of systems is negative, except for the pertinent positives and negatives detailed in the HPI.     History obtained from review  of EMR, discussion with primary team, and interview with family, facility staff/caregiver and/or Ms. Schwandt.  I reviewed available labs, medications, imaging, studies and related documents from the EMR.  Records reviewed and summarized above.    Physical Exam:  Pulse 80,  resp 16, 100/62, sats 95% on room  air Constitutional: NAD General: frail appearing EYES: anicteric sclera, lids intact, no discharge  ENMT: intact hearing, oral mucous membranes moist, dentition intact CV: S1S2, RRR, no LE edema Pulmonary: LCTA, no increased work of breathing, no cough, room air Abdomen: normo-active BS + 4 quadrants, soft and non tender GU: deferred MSK: moves all extremities, ambulatory Skin: warm and dry, no rashes or wounds on visible skin Neuro: generalized weakness, A & O x 2 Psych: non-anxious affect Hem/lymph/immuno: no widespread bruising   Thank you for the opportunity to participate in the care of Ms. Arechiga.  The palliative care team will continue to follow. Please call our office at [phone] if we can be of additional assistance.   Ezekiel Slocumb, NP   COVID-19 PATIENT SCREENING TOOL Asked and negative response unless otherwise noted:   Have you had symptoms of covid, tested positive or been in contact with someone with symptoms/positive test in the past 5-10 days? No

## 2021-02-15 ENCOUNTER — Telehealth: Payer: Self-pay | Admitting: Cardiovascular Disease

## 2021-02-15 NOTE — Telephone Encounter (Signed)
Was able to reach out to Debbie pt's daughter (DPR approved) regarding pt. Debbie reports Mrs. Ziska had an "almost" syncopal episode Thursday night, she woke up around 9 pm to use the BR, got up and when she came back from the BR, "he legs locked up as before when she passed out and got put in the hospital, but my brother was able to catch her and help her back to bed".   Eunice Blase is concern as this can happen again as pt wakes up to use the BR and if her BP is low she can "pass out" and end up back in the hospital. Problem is, pt's son (debbie brother) is having to leave and no one will be home with Mrs. Pryer during the night.  Advised for another family member to stay with pt-Debbie reports, no family around, other kids [place] of [place].  Suggest home aide PRN-Debbie reports, pt has refused  Worse scenario is placing pt in a SNF for her safety-Debbie st she agrees, both other 2 siblings has disagree on that option  Pt does have a "life alert" button for falls and emergencies   Suggest that if pt insist staying by herself, at night when she needs to use the BR, have her sit on the bed for a few minutes to catch her bearings, sip on water before getting up, make postural changes slowly, and use her walker. If returning back to the BR and still feels "syncopal" check BP, if low may need to take another dose of her midodrine 10 mg tab.   Although, strongly advise pt is not to be alone especially at night for safety concern d/t risk of injury. Debbie verbalized understanding, reports thankful for the return call and helpful information as she is worried for her mother having a "syncopal" episode during the night as she will be alone.   Otherwise all questions or concerns were address and no additional concerns at this time. Agreeable to plan, will call back for anything further.

## 2021-02-15 NOTE — Telephone Encounter (Signed)
Patient daughter calling with concerns mother takes midodrine had an syncope episode thursdays when got up to go to bathroom could not move her legs had to helped back in bed.

## 2021-02-18 ENCOUNTER — Other Ambulatory Visit: Payer: Self-pay

## 2021-02-18 MED ORDER — MIDODRINE HCL 10 MG PO TABS: 10.0000 mg | ORAL_TABLET | Freq: Three times a day (TID) | ORAL | 11 refills | Status: DC

## 2021-03-08 ENCOUNTER — Other Ambulatory Visit: Payer: Self-pay

## 2021-03-08 MED ORDER — MIDODRINE HCL 10 MG PO TABS: 10.0000 mg | ORAL_TABLET | Freq: Three times a day (TID) | ORAL | 1 refills | Status: DC

## 2021-03-08 NOTE — Telephone Encounter (Signed)
*  STAT* If patient is at the pharmacy, call can be transferred to refill team.   1. Which medications need to be refilled? (please list name of each medication and dose if known) Midodrine  2. Which pharmacy/location (including street and city if local pharmacy) is medication to be sent to? [facility]  3. Do they need a 30 day or 90 day supply? 90

## 2021-03-09 ENCOUNTER — Ambulatory Visit (INDEPENDENT_AMBULATORY_CARE_PROVIDER_SITE_OTHER): Payer: Medicare Other | Admitting: Cardiovascular Disease

## 2021-03-09 ENCOUNTER — Encounter: Payer: Self-pay | Admitting: Cardiovascular Disease

## 2021-03-09 ENCOUNTER — Ambulatory Visit: Payer: Medicare Other | Admitting: Cardiovascular Disease

## 2021-03-09 ENCOUNTER — Other Ambulatory Visit: Payer: Self-pay

## 2021-03-09 VITALS — BP 116/80 | HR 93 | Ht 63.0 in | Wt 147.4 lb

## 2021-03-09 DIAGNOSIS — I4891 Unspecified atrial fibrillation: Secondary | ICD-10-CM | POA: Diagnosis not present

## 2021-03-09 DIAGNOSIS — E1169 Type 2 diabetes mellitus with other specified complication: Secondary | ICD-10-CM

## 2021-03-09 DIAGNOSIS — I10 Essential (primary) hypertension: Secondary | ICD-10-CM

## 2021-03-09 DIAGNOSIS — E782 Mixed hyperlipidemia: Secondary | ICD-10-CM

## 2021-03-09 NOTE — Progress Notes (Signed)
Cardiology Office Note  Date:  03/09/2021   ID:  Loretta Webb, DOB [birth year], MRN [id number]  PCP:  [name], MD   Chief Complaint  Patient presents with   1 month follow up     "Doing well." Medications reviewed by the patient verbally.      HPI:  [name] is a 87 year old woman with past medical history of Permanent atrial fibrillation Diabetes type 2 Fibromyalgia/chronic fatigue/sleep apnea/anxiety dementia Hospital admission January 06, 2021 for falls, syncope, atrial fibrillation, hypertension Who presents for follow-up of her atrial fibrillation  Last seen in clinic October 2022 On that visit digoxin dosing cut in half , digoxin level 1.7 Recommended to take midodrine 3 times a day 6 AM, 11 AM, 3-4 PM  Presents with family Family reports Seroquel held yesterday Melatonin increased, Remeron increased, had a better night sleep last night  No near syncope or syncope Lives alone, family helps with medications He denies any recent falls Family helps with groceries, ADLs, chores  ED reports she has gait instability, walks with a cane No regular exercise program  Family reports medication compliance, they have a large pillbox and one of the daughters manages this Daughter asks her mother on today's visit: " You are taking your medications right"  Seen in the hospital August 2021, was not taking her medications at that time Had stopped Eliquis, Coreg, digoxin was in atrial fibrillation with RVR rate 130  Family reports they bought her some vitamin water to drink She enjoys this better than regular water  EKG personally reviewed by myself on todays visit   At home prior to admission:  1 fall witnessed by PT, second fall/syncope unwitnessed On arrival to the emergency room blood pressure low 90 systolic,dehydration Improvement of blood pressure with IV fluids concern for sepsis and she was started on broad-spectrum antibiotics urine culture  negative, blood culture so far negative, no pneumonia on chest x-ray  At the time of discharge she was started on midodrine for low blood pressure, Digoxin for rate control with metoprolol  EKG personally reviewed by myself on todays visit Atrial fibrillation rate 93 bpm diffuse T wave abnormality   PMH:   has a past medical history of Arthritis, Chronic fatigue, DM2 (diabetes mellitus, type 2) (HCC), Esophageal stricture, Fibromyalgia, GERD (gastroesophageal reflux disease), Hypertension, Lumbar stenosis, Osteoporosis, and Sleep apnea.  PSH:    Past Surgical History:  Procedure Laterality Date   ABDOMINAL HYSTERECTOMY     APPENDECTOMY     knee replacements     ROTATOR CUFF REPAIR      Current Outpatient Medications  Medication Sig Dispense Refill   acetaminophen (TYLENOL) 500 MG tablet Take 500 mg by mouth 2 (two) times daily as needed.     aspirin 81 MG chewable tablet Chew by mouth daily.     Cholecalciferol 25 MCG (1000 UT) tablet Take 1,000 Units by mouth once a week.     Cyanocobalamin (VITAMIN B-12) 5000 MCG TBDP Take 5,000 mcg by mouth daily.     diclofenac Sodium (VOLTAREN) 1 % GEL Apply 2 g topically 4 (four) times daily. 50 g 0   donepezil (ARICEPT) 10 MG tablet Take 10 mg by mouth daily.     DULoxetine (CYMBALTA) 60 MG capsule Take 60 mg by mouth daily.      Magnesium 250 MG TABS Take 250 mg by mouth daily.     MELATONIN ER PO Take by mouth at bedtime.     metoprolol  tartrate (LOPRESSOR) 50 MG tablet Take 1 tablet (50 mg total) by mouth 2 (two) times daily.     midodrine (PROAMATINE) 10 MG tablet Take 1 tablet (10 mg total) by mouth 3 (three) times daily with meals. For low BP (6 Am, 11 Am , 3 -4 pm), ok to take without food 270 tablet 1   mirtazapine (REMERON) 15 MG tablet Take 30 mg by mouth at bedtime.     pantoprazole (PROTONIX) 40 MG tablet Take 40 mg by mouth daily.     polyethylene glycol (MIRALAX / GLYCOLAX) 17 g packet Take 17 g by mouth 2 (two) times daily. 14  each 0   pravastatin (PRAVACHOL) 40 MG tablet Take 40 mg by mouth at bedtime.      senna-docusate (SENOKOT-S) 8.6-50 MG tablet Take 1 tablet by mouth 2 (two) times daily.     digoxin (LANOXIN) 0.125 MG tablet Take 0.5 tablets (0.0625 mg total) by mouth daily. (Patient not taking: Reported on 03/09/2021) 46 tablet 3   QUEtiapine (SEROQUEL) 25 MG tablet Take 25 mg by mouth 2 (two) times daily. (Patient not taking: Reported on 03/09/2021)     No current facility-administered medications for this visit.     Allergies:   Patient has no known allergies.   Social History:  The patient  reports that she has never smoked. She has never used smokeless tobacco. She reports that she does not drink alcohol and does not use drugs.   Family History:   family history includes Cancer in her father, mother, and sister; Cerebral aneurysm in her father; Depression in her sister; Diabetes in her father.    Review of Systems: Review of Systems  Constitutional: Negative.   HENT: Negative.    Respiratory: Negative.    Cardiovascular: Negative.   Gastrointestinal: Negative.   Musculoskeletal: Negative.   Neurological: Negative.   Psychiatric/Behavioral: Negative.    All other systems reviewed and are negative.   PHYSICAL EXAM: VS:  BP 116/80 (BP Location: Right Arm, Patient Position: Sitting, Cuff Size: Normal)   Pulse 93   Ht 5\' 3"  (1.6 m)   Wt 147 lb 6 oz (66.8 kg)   LMP  (LMP Unknown)   SpO2 98%   BMI 26.11 kg/m  , BMI Body mass index is 26.11 kg/m. Constitutional:  oriented to person, place, and time. No distress.  Presenting in a wheelchair HENT:  Head: Grossly normal Eyes:  no discharge. No scleral icterus.  Neck: No JVD, no carotid bruits  Cardiovascular: Irregularly irregular, no murmurs appreciated Pulmonary/Chest: Clear to auscultation bilaterally, no wheezes or rails Abdominal: Soft.  no distension.  no tenderness.  Musculoskeletal: Normal range of motion Neurological:  normal muscle  tone. Coordination normal. No atrophy Skin: Skin warm and dry Psychiatric: normal affect, pleasant  Recent Labs: 01/06/2021: ALT 11; BUN 21; Magnesium 2.4; Potassium 3.6; Sodium 136 01/07/2021: Creatinine, Ser 0.96; Hemoglobin 10.3; Platelets 317; TSH 3.528    Lipid Panel No results found for: CHOL, HDL, LDLCALC, TRIG    Wt Readings from Last 3 Encounters:  03/09/21 147 lb 6 oz (66.8 kg)  02/08/21 148 lb 3.2 oz (67.2 kg)  01/12/21 146 lb 2.6 oz (66.3 kg)      ASSESSMENT AND PLAN:  Problem List Items Addressed This Visit       Cardiology Problems   Benign essential HTN   DM type 2 with diabetic mixed hyperlipidemia (HCC)   Atrial fibrillation with rapid ventricular response (HCC) - Primary   Hyperlipidemia, mixed  Hypotension Discuss timing of midodrine 6 AM, 11 AM, 3 PM preferably Continue oral fluids, no near syncope and syncope  Atrial fib, persistent Rate controlled Low dose digoxin, metoprolol Not on anticoagulation secondary to balance issues  Diabetes type 2 not on insulin weight loss A1C 6.7  Hyperlipidemia On statin,  Total chol 152, LDL 58   Total encounter time more than 35 minutes  Greater than 50% was spent in counseling and coordination of care with the patient    Signed, Dossie Arbour, M.D., Ph.D. [facility] [place], [place] [phone]

## 2021-03-09 NOTE — Patient Instructions (Addendum)
Medication Instructions:  No changes  If you need a refill on your cardiac medications before your next appointment, please call your pharmacy.   Lab work: No new labs needed  Testing/Procedures: No new testing needed  Follow-Up: At [facility], you and your health needs are our priority.  As part of our continuing mission to provide you with exceptional heart care, we have created designated Provider Care Teams.  These Care Teams include your primary Cardiologist (physician) and Advanced Practice Providers (APPs -  Physician Assistants and Nurse Practitioners) who all work together to provide you with the care you need, when you need it.  You will need a follow up appointment in months, APP ok  Providers on your designated Care Team:   Nicolasa Ducking, NP Eula Listen, PA-C Cadence [place], [place]  COVID-19 Vaccine Information can be found at: [email or web address] For questions related to vaccine distribution or appointments, please email [email or web address] or call [phone].

## 2021-03-10 ENCOUNTER — Telehealth: Payer: Self-pay | Admitting: Cardiovascular Disease

## 2021-03-10 NOTE — Telephone Encounter (Signed)
AVS and note unclear.  Please advise on fu time .

## 2021-03-25 ENCOUNTER — Encounter: Payer: Self-pay | Admitting: Student in an Organized Health Care Education/Training Program

## 2021-04-05 ENCOUNTER — Encounter: Payer: Self-pay | Admitting: Student in an Organized Health Care Education/Training Program

## 2021-04-06 ENCOUNTER — Encounter: Payer: Self-pay | Admitting: Student in an Organized Health Care Education/Training Program

## 2021-04-06 ENCOUNTER — Other Ambulatory Visit: Payer: Self-pay

## 2021-04-06 ENCOUNTER — Ambulatory Visit
Payer: Medicare Other | Attending: Student in an Organized Health Care Education/Training Program | Admitting: Student in an Organized Health Care Education/Training Program

## 2021-04-06 DIAGNOSIS — M19012 Primary osteoarthritis, left shoulder: Secondary | ICD-10-CM

## 2021-04-06 DIAGNOSIS — G894 Chronic pain syndrome: Secondary | ICD-10-CM | POA: Diagnosis not present

## 2021-04-06 DIAGNOSIS — Z8739 Personal history of other diseases of the musculoskeletal system and connective tissue: Secondary | ICD-10-CM | POA: Diagnosis not present

## 2021-04-06 DIAGNOSIS — M19011 Primary osteoarthritis, right shoulder: Secondary | ICD-10-CM | POA: Diagnosis not present

## 2021-04-06 NOTE — Patient Instructions (Signed)

## 2021-04-06 NOTE — Progress Notes (Signed)
Patient: Loretta Webb  Service Category: E/M  Provider: Gillis Santa, MD  DOB: [birth year]  DOS: 04/06/2021  Location: Office  MRN: [id number]  Setting: Ambulatory outpatient  Referring Provider: Rusty Aus, MD  Type: Established Patient  Specialty: Interventional Pain Management  PCP: Rusty Aus, MD  Location: Remote location  Delivery: TeleHealth     Virtual Encounter - Pain Management PROVIDER NOTE: Information contained herein reflects review and annotations entered in association with encounter. Interpretation of such information and data should be left to medically-trained personnel. Information provided to patient can be located elsewhere in the medical record under "Patient Instructions". Document created using STT-dictation technology, any transcriptional errors that may result from process are unintentional.    Contact & Pharmacy Preferred: [phone] Home: [phone] (home) Mobile: There is no such number on file (mobile). E-mail: [email or web address], [place] [address] Phone: [phone] Fax: [phone]  [facility] [id number] [place], [place] [facility] AT [facility] [address] [place] [address] Phone: [phone] Fax: [phone]  [place], [place] [address] [place] [address] Phone: [phone] Fax: [phone]   Pre-screening  Ms. Cena offered "in-person" vs "virtual" encounter. She indicated preferring virtual for this encounter.   Reason COVID-19*  Social distancing based on CDC and AMA recommendations.   I contacted [place] on 04/06/2021 via telephone.      I clearly identified myself as Gillis Santa, MD. I verified that I was speaking with the correct person using two identifiers (Name: Loretta Webb, and date of birth: 01/25/1932).  Consent I sought verbal  advanced consent from Loretta Webb for virtual visit interactions. I informed Loretta Webb of possible security and privacy concerns, risks, and limitations associated with providing "not-in-person" medical evaluation and management services. I also informed Loretta Webb of the availability of "in-person" appointments. Finally, I informed her that there would be a charge for the virtual visit and that she could be  personally, fully or partially, financially responsible for it. Loretta Webb expressed understanding and agreed to proceed.   Historic Elements   Loretta Webb is a 87 y.o. year old, female patient evaluated today after our last contact on 12/02/2020. Loretta Webb  has a past medical history of Arthritis, Chronic fatigue, DM2 (diabetes mellitus, type 2) ([place]), Esophageal stricture, Fibromyalgia, GERD (gastroesophageal reflux disease), Hypertension, Lumbar stenosis, Osteoporosis, and Sleep apnea. She also  has a past surgical history that includes knee replacements; Abdominal hysterectomy; Rotator cuff repair; and Appendectomy. Loretta Webb has a current medication list which includes the following prescription(s): acetaminophen, aspirin, cholecalciferol, vitamin b-12, diclofenac sodium, donepezil, duloxetine, magnesium, melatonin, metoprolol tartrate, midodrine, mirtazapine, pantoprazole, polyethylene glycol, quetiapine, senna-docusate, digoxin, and pravastatin. She  reports that she has never smoked. She has never used smokeless tobacco. She reports that she does not drink alcohol and does not use drugs. Loretta Webb has No Known Allergies.   HPI  Today, she is being contacted for worsening of previously known (established) problem  -right shoulder pain, saw Dr Leim Fabry with Orthopedics. She does not want go through with reverse shoulder arthroplasty -Wants to avoid surgery, too high risk -discussed repeat right suprascapular nerve block which the patient had over 6 months ago that did provide  some pain relief      Laboratory Chemistry Profile  Renal Lab Results  Component Value Date   BUN 21 01/06/2021   CREATININE 0.96 01/07/2021   BCR 10 (L) 09/03/2019   GFRAA 38 (L) 09/03/2019   GFRNONAA 57 (L) 01/07/2021    Hepatic Lab Results  Component Value Date   AST 27 01/06/2021   ALT 11 01/06/2021   ALBUMIN 3.8 01/06/2021   ALKPHOS 43 01/06/2021   LIPASE 42 09/11/2015    Electrolytes Lab Results  Component Value Date   NA 136 01/06/2021   K 3.6 01/06/2021   CL 101 01/06/2021   CALCIUM 9.0 01/06/2021   MG 2.4 01/06/2021    Bone Lab Results  Component Value Date   25OHVITD1 39 09/03/2019   25OHVITD2 3.4 09/03/2019   25OHVITD3 36 09/03/2019    Inflammation (CRP: Acute Phase) (ESR: Chronic Phase) Lab Results  Component Value Date   CRP <1 09/03/2019   ESRSEDRATE 17 09/03/2019   LATICACIDVEN 1.3 01/08/2021         Note: Above Lab results reviewed.  Imaging  ECHOCARDIOGRAM COMPLETE    ECHOCARDIOGRAM REPORT       Patient Name:   Loretta Webb Date of Exam: 01/06/2021 Medical Rec #:  [id number]       Height:       63.0 in Accession #:    [id number]      Weight:       152.6 lb Date of Birth:  1931-12-09       BSA:          1.723 m Patient Age:    87 years        BP:           149/89 mmHg Patient Gender: F               HR:           118 bpm. Exam Location:  [facility]  Procedure: 2D Echo, Color Doppler and Cardiac Doppler  Indications:     R55 Syncope   History:         Patient has no prior history of Echocardiogram examinations.                  Arrythmias:Atrial Fibrillation; Risk Factors:Hypertension,                  Diabetes and Sleep Apnea.   Sonographer:     Charmayne Sheer Referring Phys:  [id number] Elwyn Reach Diagnosing Phys: Kate Sable MD    Sonographer Comments: Suboptimal subcostal window. IMPRESSIONS   1. Left ventricular ejection fraction, by estimation, is 50 to 55%. The left ventricle has low normal function. The left  ventricle has no regional wall motion abnormalities. Left ventricular diastolic parameters are indeterminate.  2. Right ventricular systolic function is normal. The right ventricular size is normal.  3. Left atrial size was mildly dilated.  4. The mitral valve is normal in structure. Mild mitral valve regurgitation.  5. The aortic valve is tricuspid. Aortic valve regurgitation is not visualized. Mild aortic valve sclerosis is present, with no evidence of aortic valve stenosis.  6. The inferior vena cava is normal in size with greater than 50% respiratory variability, suggesting right atrial pressure of 3 mmHg.  FINDINGS  Left Ventricle: Left ventricular ejection fraction, by estimation, is 50 to 55%. The left ventricle has low normal function. The left ventricle has no regional wall motion abnormalities. The left ventricular internal cavity size was normal in size.  There is no left ventricular  hypertrophy. Left ventricular diastolic parameters are indeterminate.  Right Ventricle: The right ventricular size is normal. No increase in right ventricular wall thickness. Right ventricular systolic function is normal.  Left Atrium: Left atrial size was mildly dilated.  Right Atrium: Right atrial size was normal in size.  Pericardium: There is no evidence of pericardial effusion.  Mitral Valve: The mitral valve is normal in structure. Mild mitral valve regurgitation. MV peak gradient, 3.8 mmHg. The mean mitral valve gradient is 1.0 mmHg.  Tricuspid Valve: The tricuspid valve is normal in structure. Tricuspid valve regurgitation is mild.  Aortic Valve: The aortic valve is tricuspid. Aortic valve regurgitation is not visualized. Mild aortic valve sclerosis is present, with no evidence of aortic valve stenosis. Aortic valve mean gradient measures 3.0 mmHg. Aortic valve peak gradient  measures 5.0 mmHg. Aortic valve area, by VTI measures 1.60 cm.  Pulmonic Valve: The pulmonic valve was not well  visualized. Pulmonic valve regurgitation is trivial.  Aorta: The aortic root is normal in size and structure.  Venous: The inferior vena cava is normal in size with greater than 50% respiratory variability, suggesting right atrial pressure of 3 mmHg.  IAS/Shunts: No atrial level shunt detected by color flow Doppler.    LEFT VENTRICLE PLAX 2D LVIDd:         4.69 cm  Diastology LVIDs:         3.39 cm  LV e' medial:    8.38 cm/s LV PW:         1.12 cm  LV E/e' medial:  8.7 LV IVS:        0.94 cm  LV e' lateral:   13.60 cm/s LVOT diam:     2.00 cm  LV E/e' lateral: 5.3 LV SV:         30 LV SV Index:   17 LVOT Area:     3.14 cm    RIGHT VENTRICLE RV Basal diam:  3.97 cm RV S prime:     8.92 cm/s  LEFT ATRIUM             Index       RIGHT ATRIUM           Index LA diam:        4.40 cm 2.55 cm/m  RA Area:     18.50 cm LA Vol (A2C):   47.2 ml 27.39 ml/m RA Volume:   52.30 ml  30.35 ml/m LA Vol (A4C):   60.4 ml 35.05 ml/m LA Biplane Vol: 57.4 ml 33.31 ml/m  AORTIC VALVE                   PULMONIC VALVE AV Area (Vmax):    2.18 cm    PV Vmax:       0.78 m/s AV Area (Vmean):   1.86 cm    PV Vmean:      51.700 cm/s AV Area (VTI):     1.60 cm    PV VTI:        0.100 m AV Vmax:           112.00 cm/s PV Peak grad:  2.4 mmHg AV Vmean:          80.400 cm/s PV Mean grad:  1.0 mmHg AV VTI:            0.187 m AV Peak Grad:      5.0 mmHg AV Mean Grad:      3.0 mmHg LVOT Vmax:  77.70 cm/s LVOT Vmean:        47.500 cm/s LVOT VTI:          0.095 m LVOT/AV VTI ratio: 0.51   AORTA Ao Root diam: 3.40 cm  MITRAL VALVE               TRICUSPID VALVE MV Area (PHT): 4.24 cm    TR Peak grad:   19.2 mmHg MV Area VTI:   1.74 cm    TR Vmax:        219.00 cm/s MV Peak grad:  3.8 mmHg MV Mean grad:  1.0 mmHg    SHUNTS MV Vmax:       0.97 m/s    Systemic VTI:  0.10 m MV Vmean:      53.2 cm/s   Systemic Diam: 2.00 cm MV Decel Time: 179 msec MV E velocity: 72.65 cm/s  Kate Sable MD Electronically signed by Kate Sable MD Signature Date/Time: 01/06/2021/12:51:11 PM      Final    Assessment  The primary encounter diagnosis was History of rotator cuff syndrome (bilateral). Diagnoses of Localized osteoarthritis of shoulder regions, bilateral and Chronic pain syndrome were also pertinent to this visit.  Plan of Care    Orders:  Orders Placed This Encounter  Procedures   SUPRASCAPULAR NERVE BLOCK    For shoulder pain.    Standing Status:   Future    Standing Expiration Date:   07/06/2021    Scheduling Instructions:     RIGHT     Level(s): Suprascapular notch     Scheduling Timeframe: As permitted by the schedule    Order Specific Question:   Where will this procedure be performed?    Answer:   [facility]    Follow-up plan:   Return in about 1 week (around 04/13/2021) for R SSNB , without sedation.    Recent Visits No visits were found meeting these conditions. Showing recent visits within past 90 days and meeting all other requirements Today's Visits Date Type Provider Dept  04/06/21 Office Visit Gillis Santa, MD [facility]  Showing today's visits and meeting all other requirements Future Appointments No visits were found meeting these conditions. Showing future appointments within next 90 days and meeting all other requirements I discussed the assessment and treatment plan with the patient. The patient was provided an opportunity to ask questions and all were answered. The patient agreed with the plan and demonstrated an understanding of the instructions.  Patient advised to call back or seek an in-person evaluation if the symptoms or condition worsens.  Duration of encounter: 62mnutes.  Note by: BGillis Santa MD Date: 04/06/2021; Time: 12:15 PM

## 2021-04-13 ENCOUNTER — Telehealth: Payer: Self-pay | Admitting: Student in an Organized Health Care Education/Training Program

## 2021-04-13 DIAGNOSIS — G8929 Other chronic pain: Secondary | ICD-10-CM

## 2021-04-13 DIAGNOSIS — M5416 Radiculopathy, lumbar region: Secondary | ICD-10-CM

## 2021-04-13 NOTE — Telephone Encounter (Signed)
Patient's daughter is asking if patient can have the SSNB and LESI both when she comes for her procedure appt. Please ask Dr. Cherylann Ratel and advise Eunice Blase. She states patient is having significant pain in both her shoulder area and lumbar area.

## 2021-04-14 ENCOUNTER — Ambulatory Visit (HOSPITAL_BASED_OUTPATIENT_CLINIC_OR_DEPARTMENT_OTHER): Payer: Medicare Other | Admitting: Student in an Organized Health Care Education/Training Program

## 2021-04-14 ENCOUNTER — Telehealth: Payer: Self-pay | Admitting: Student in an Organized Health Care Education/Training Program

## 2021-04-14 ENCOUNTER — Other Ambulatory Visit: Payer: Self-pay

## 2021-04-14 ENCOUNTER — Encounter: Payer: Self-pay | Admitting: Student in an Organized Health Care Education/Training Program

## 2021-04-14 ENCOUNTER — Ambulatory Visit
Admission: RE | Admit: 2021-04-14 | Discharge: 2021-04-14 | Disposition: A | Discharge status: Home / Self Care | Payer: Medicare Other | Source: Ambulatory Visit | Attending: Student in an Organized Health Care Education/Training Program | Admitting: Student in an Organized Health Care Education/Training Program

## 2021-04-14 VITALS — BP 138/75 | HR 89 | Temp 97.0°F | Resp 16 | Ht 63.0 in | Wt 144.0 lb

## 2021-04-14 DIAGNOSIS — M5416 Radiculopathy, lumbar region: Secondary | ICD-10-CM

## 2021-04-14 DIAGNOSIS — Z8739 Personal history of other diseases of the musculoskeletal system and connective tissue: Secondary | ICD-10-CM | POA: Insufficient documentation

## 2021-04-14 DIAGNOSIS — G8929 Other chronic pain: Secondary | ICD-10-CM | POA: Diagnosis present

## 2021-04-14 DIAGNOSIS — M19011 Primary osteoarthritis, right shoulder: Secondary | ICD-10-CM

## 2021-04-14 DIAGNOSIS — G894 Chronic pain syndrome: Secondary | ICD-10-CM | POA: Diagnosis present

## 2021-04-14 DIAGNOSIS — M19012 Primary osteoarthritis, left shoulder: Secondary | ICD-10-CM

## 2021-04-14 DIAGNOSIS — M25511 Pain in right shoulder: Secondary | ICD-10-CM | POA: Diagnosis present

## 2021-04-14 MED ORDER — METHYLPREDNISOLONE ACETATE 80 MG/ML IJ SUSP
80.0000 mg | Freq: Once | INTRAMUSCULAR | Status: AC
  Administered 2021-04-14: 80 mg
  Filled 2021-04-14: qty 1

## 2021-04-14 MED ORDER — IOPAMIDOL (ISOVUE-M 200) INJECTION 41%
10.0000 mL | Freq: Once | INTRAMUSCULAR | Status: AC
  Administered 2021-04-14: 10 mL via INTRA_ARTICULAR

## 2021-04-14 MED ORDER — DEXAMETHASONE SODIUM PHOSPHATE 10 MG/ML IJ SOLN
INTRAMUSCULAR | Status: AC
  Filled 2021-04-14: qty 1

## 2021-04-14 MED ORDER — SODIUM CHLORIDE 0.9% FLUSH
2.0000 mL | Freq: Once | INTRAVENOUS | Status: AC
  Administered 2021-04-14: 2 mL

## 2021-04-14 MED ORDER — LIDOCAINE HCL 2 % IJ SOLN
20.0000 mL | Freq: Once | INTRAMUSCULAR | Status: AC
  Administered 2021-04-14: 400 mg

## 2021-04-14 MED ORDER — SODIUM CHLORIDE (PF) 0.9 % IJ SOLN
INTRAMUSCULAR | Status: AC
  Filled 2021-04-14: qty 10

## 2021-04-14 MED ORDER — ROPIVACAINE HCL 2 MG/ML IJ SOLN
INTRAMUSCULAR | Status: AC
  Filled 2021-04-14: qty 20

## 2021-04-14 MED ORDER — DEXAMETHASONE SODIUM PHOSPHATE 10 MG/ML IJ SOLN
10.0000 mg | Freq: Once | INTRAMUSCULAR | Status: AC
  Administered 2021-04-14: 10 mg

## 2021-04-14 MED ORDER — ROPIVACAINE HCL 2 MG/ML IJ SOLN
4.0000 mL | Freq: Once | INTRAMUSCULAR | Status: AC
  Administered 2021-04-14: 4 mL via PERINEURAL

## 2021-04-14 NOTE — Telephone Encounter (Signed)
Called daughter and she states that Dr Cherylann Ratel was going to put an order in for Neuro psych.  Please place order.

## 2021-04-14 NOTE — Progress Notes (Signed)
PROVIDER NOTE: Information contained herein reflects review and annotations entered in association with encounter. Interpretation of such information and data should be left to medically-trained personnel. Information provided to patient can be located elsewhere in the medical record under "Patient Instructions". Document created using STT-dictation technology, any transcriptional errors that may result from process are unintentional.    Patient: Loretta Webb  Service Category: Procedure  Provider: Gillis Santa, MD  DOB: 27-Aug-1931  DOS: 04/14/2021  Location: [facility]  MRN: [id number]  Setting: Ambulatory - outpatient  Referring Provider: Rusty Aus, MD  Type: Established Patient  Specialty: Interventional Pain Management  PCP: Rusty Aus, MD   Primary Reason for Visit: Interventional Pain Management Treatment. CC: right shoulder pain  Procedure:          Anesthesia, Analgesia, Anxiolysis:  Type: Palliative Suprascapular nerve Block #3  Primary Purpose: Diagnostic Region: Posterior Shoulder & Scapular Areas Level: Superior to the scapular spine, in the lateral aspect of the supraspinatus fossa (Suprescapular notch). Target Area: Suprascapular nerve as it passes thru the lower portion of the suprascapular notch. Approach: Posterior percutaneous approach. Laterality: Right-Side   Local Anesthetic: Lidocaine 1-2%  Position: Prone   Indications: 1. History of rotator cuff syndrome (bilateral)   2. Chronic radicular lumbar pain   3. Chronic right shoulder pain   4. Localized osteoarthritis of shoulder regions, bilateral   5. Chronic pain syndrome     Pain Score: Pre-procedure: 8 /10 Post-procedure:  (low back 0, shoulder 8)/10   Pre-op H&P Assessment:  Loretta Webb is a 87 y.o. (year old), female patient, seen today for interventional treatment. She  has a past surgical history that includes knee replacements; Abdominal hysterectomy; Rotator cuff repair; and  Appendectomy. Loretta Webb has a current medication list which includes the following prescription(s): acetaminophen, aspirin, cholecalciferol, vitamin b-12, diclofenac sodium, digoxin, donepezil, duloxetine, magnesium, melatonin, metoprolol tartrate, midodrine, mirtazapine, pantoprazole, polyethylene glycol, senna-docusate, pravastatin, and quetiapine. Her primarily concern today is the Shoulder Pain (right) and Back Pain (lumbar)  Initial Vital Signs:  Pulse/HCG Rate: 89ECG Heart Rate: 94 Temp: (!) 97 F (36.1 C) Resp: 16 BP: 116/86 SpO2: 96 %  BMI: Estimated body mass index is 25.51 kg/m as calculated from the following:   Height as of this encounter: 5\' 3"  (1.6 m).   Weight as of this encounter: 144 lb (65.3 kg).  Risk Assessment: Allergies: Reviewed. She has No Known Allergies.  Allergy Precautions: None required Coagulopathies: Reviewed. None identified.  Blood-thinner therapy: None at this time Active Infection(s): Reviewed. None identified. [name] is afebrile  Site Confirmation: Loretta Webb was asked to confirm the procedure and laterality before marking the site Procedure checklist: Completed Consent: Before the procedure and under the influence of no sedative(s), amnesic(s), or anxiolytics, the patient was informed of the treatment options, risks and possible complications. To fulfill our ethical and legal obligations, as recommended by the American Medical Association's Code of Ethics, I have informed the patient of my clinical impression; the nature and purpose of the treatment or procedure; the risks, benefits, and possible complications of the intervention; the alternatives, including doing nothing; the risk(s) and benefit(s) of the alternative treatment(s) or procedure(s); and the risk(s) and benefit(s) of doing nothing. The patient was provided information about the general risks and possible complications associated with the procedure. These may include, but are not limited  to: failure to achieve desired goals, infection, bleeding, organ or nerve damage, allergic reactions, paralysis, and death. In addition, the  patient was informed of those risks and complications associated to the procedure, such as failure to decrease pain; infection; bleeding; organ or nerve damage with subsequent damage to sensory, motor, and/or autonomic systems, resulting in permanent pain, numbness, and/or weakness of one or several areas of the body; allergic reactions; (i.e.: anaphylactic reaction); and/or death. Furthermore, the patient was informed of those risks and complications associated with the medications. These include, but are not limited to: allergic reactions (i.e.: anaphylactic or anaphylactoid reaction(s)); adrenal axis suppression; blood sugar elevation that in diabetics may result in ketoacidosis or comma; water retention that in patients with history of congestive heart failure may result in shortness of breath, pulmonary edema, and decompensation with resultant heart failure; weight gain; swelling or edema; medication-induced neural toxicity; particulate matter embolism and blood vessel occlusion with resultant organ, and/or nervous system infarction; and/or aseptic necrosis of one or more joints. Finally, the patient was informed that Medicine is not an exact science; therefore, there is also the possibility of unforeseen or unpredictable risks and/or possible complications that may result in a catastrophic outcome. The patient indicated having understood very clearly. We have given the patient no guarantees and we have made no promises. Enough time was given to the patient to ask questions, all of which were answered to the patient's satisfaction. Loretta Webb has indicated that she wanted to continue with the procedure. Attestation: I, the ordering provider, attest that I have discussed with the patient the benefits, risks, side-effects, alternatives, likelihood of achieving goals, and  potential problems during recovery for the procedure that I have provided informed consent. Date  Time: 04/14/2021 11:08 AM  Pre-Procedure Preparation:  Monitoring: As per clinic protocol. Respiration, ETCO2, SpO2, BP, heart rate and rhythm monitor placed and checked for adequate function Safety Precautions: Patient was assessed for positional comfort and pressure points before starting the procedure. Time-out: I initiated and conducted the "Time-out" before starting the procedure, as per protocol. The patient was asked to participate by confirming the accuracy of the "Time Out" information. Verification of the correct person, site, and procedure were performed and confirmed by me, the nursing staff, and the patient. "Time-out" conducted as per Joint Commission's Universal Protocol (UP.01.01.01). Time: 1200  Description of Procedure:          Area Prepped: Entire shoulder Area DuraPrep (Iodine Povacrylex [0.7% available iodine] and Isopropyl Alcohol, 74% w/w) Safety Precautions: Aspiration looking for blood return was conducted prior to all injections. At no point did we inject any substances, as a needle was being advanced. No attempts were made at seeking any paresthesias. Safe injection practices and needle disposal techniques used. Medications properly checked for expiration dates. SDV (single dose vial) medications used. Description of the Procedure: Protocol guidelines were followed. The patient was placed in position over the procedure table. The target area was identified and the area prepped in the usual manner. Skin & deeper tissues infiltrated with local anesthetic. Appropriate amount of time allowed to pass for local anesthetics to take effect. The procedure needles were then advanced to the target area. Proper needle placement secured. Negative aspiration confirmed. Solution injected in intermittent fashion, asking for systemic symptoms every 0.5cc of injectate. The needles were then  removed and the area cleansed, making sure to leave some of the prepping solution back to take advantage of its long term bactericidal properties.  Vitals:   04/14/21 1121 04/14/21 1200 04/14/21 1205 04/14/21 1208  BP: 116/86 133/85 137/76 138/75  Pulse: 89     Resp:  16 16 18 16   Temp: (!) 97 F (36.1 C)     TempSrc: Temporal     SpO2: 96% 98% 99% 99%  Weight: 144 lb (65.3 kg)     Height: 5\' 3"  (1.6 m)       Start Time: 1200 hrs. End Time: 1207 hrs. Materials:  Needle(s) Type: Spinal Needle Gauge: 22G Length: 3.5-in Medication(s): Please see orders for medications and dosing details. 5cc solution made of 4 cc of 0.2% ropivacaine, 1 cc of methylprednisolone, 80 mg/cc.  Imaging Guidance (Non-Spinal):          Type of Imaging Technique: Fluoroscopy Guidance (Non-Spinal) Indication(s): Assistance in needle guidance and placement for procedures requiring needle placement in or near specific anatomical locations not easily accessible without such assistance. Exposure Time: Please see nurses notes. Contrast: Before injecting any contrast, we confirmed that the patient did not have an allergy to iodine, shellfish, or radiological contrast. Once satisfactory needle placement was completed at the desired level, radiological contrast was injected. Contrast injected under live fluoroscopy. No contrast complications. See chart for type and volume of contrast used. Fluoroscopic Guidance: I was personally present during the use of fluoroscopy. "Tunnel Vision Technique" used to obtain the best possible view of the target area. Parallax error corrected before commencing the procedure. "Direction-depth-direction" technique used to introduce the needle under continuous pulsed fluoroscopy. Once target was reached, antero-posterior, oblique, and lateral fluoroscopic projection used confirm needle placement in all planes. Images permanently stored in EMR. Interpretation: I personally interpreted the imaging  intraoperatively. Adequate needle placement confirmed in multiple planes. Appropriate spread of contrast into desired area was observed. No evidence of afferent or efferent intravascular uptake. Permanent images saved into the patient's record.   Post-operative Assessment:  Post-procedure Vital Signs:  Pulse/HCG Rate: 89100 Temp: (!) 97 F (36.1 C) Resp: 16 BP: 138/75 SpO2: 99 %  EBL: None  Complications: No immediate post-treatment complications observed by team, or reported by patient.  Note: The patient tolerated the entire procedure well. A repeat set of vitals were taken after the procedure and the patient was kept under observation following institutional policy, for this type of procedure. Post-procedural neurological assessment was performed, showing return to baseline, prior to discharge. The patient was provided with post-procedure discharge instructions, including a section on how to identify potential problems. Should any problems arise concerning this procedure, the patient was given instructions to immediately contact us, at any time, without hesitation. In any case, we plan to contact the patient by telephone for a follow-up status report regarding this interventional procedure.  Comments:  No additional relevant information.  Plan of Care  Orders:  Orders Placed This Encounter  Procedures   DG PA[facility] C-ARM 1-60 MIN NO REPORT    Intraoperative interpretation by procedural physician at [place].    Standing Status:   Standing    Number of Occurrences:   1    Order Specific Question:   Reason for exam:    Answer:   Assistance in needle guidance and placement for procedures requiring needle placement in or near specific anatomical locations not easily accessible without such assistance.      Medications ordered for procedure: Meds ordered this encounter  Medications   iopamidol (ISOVUE-M) 41 % intrathecal injection 10 mL    Must be  Myelogram-compatible. If not available, you may substitute with a water-soluble, non-ionic, hypoallergenic, myelogram-compatible radiological contrast medium.   lidocaine (XYLOCAINE) 2 % (with pres) injection 400 mg   ropivacaine (  PF) 2 mg/mL (0.2%) (NAROPIN) injection 4 mL   methylPREDNISolone acetate (DEPO-MEDROL) injection 80 mg   dexamethasone (DECADRON) injection 10 mg   sodium chloride flush (NS) 0.9 % injection 2 mL    Medications administered: We administered iopamidol, lidocaine, ropivacaine (PF) 2 mg/mL (0.2%), methylPREDNISolone acetate, dexamethasone, and sodium chloride flush.  See the medical record for exact dosing, route, and time of administration.  Follow-up plan:   Return in about 8 weeks (around 06/09/2021) for Post Procedure Evaluation, virtual.     Recent Visits Date Type Provider Dept  04/06/21 Office Visit Edward Jolly, MD [facility]  Showing recent visits within past 90 days and meeting all other requirements Today's Visits Date Type Provider Dept  04/14/21 Procedure visit Edward Jolly, MD [facility]  Showing today's visits and meeting all other requirements Future Appointments Date Type Provider Dept  06/09/21 Appointment Edward Jolly, MD [facility]  Showing future appointments within next 90 days and meeting all other requirements Disposition: Discharge home  Discharge (Date  Time): 04/14/2021; 1220 hrs.   Primary Care Physician: [name], MD Location: [facility] Note by: Edward Jolly, MD Date: 04/14/2021; Time: 1:05 PM  Disclaimer:  Medicine is not an exact science. The only guarantee in medicine is that nothing is guaranteed. It is important to note that the decision to proceed with this intervention was based on the information collected from the patient. The Data and conclusions were drawn from the patient's questionnaire, the interview, and the physical examination. Because the  information was provided in large part by the patient, it cannot be guaranteed that it has not been purposely or unconsciously manipulated. Every effort has been made to obtain as much relevant data as possible for this evaluation. It is important to note that the conclusions that lead to this procedure are derived in large part from the available data. Always take into account that the treatment will also be dependent on availability of resources and existing treatment guidelines, considered by other Pain Management Practitioners as being common knowledge and practice, at the time of the intervention. For Medico-Legal purposes, it is also important to point out that variation in procedural techniques and pharmacological choices are the acceptable norm. The indications, contraindications, technique, and results of the above procedure should only be interpreted and judged by a Board-Certified Interventional Pain Specialist with extensive familiarity and expertise in the same exact procedure and technique.

## 2021-04-14 NOTE — Telephone Encounter (Signed)
Patient's daughter is looking for the psych referral for her mom. I do not see any orders in for this referral.

## 2021-04-14 NOTE — Telephone Encounter (Signed)
Called and informed daughter of MD and place. Daughter Thankful

## 2021-04-14 NOTE — Progress Notes (Signed)
PROVIDER NOTE: Information contained herein reflects review and annotations entered in association with encounter. Interpretation of such information and data should be left to medically-trained personnel. Information provided to patient can be located elsewhere in the medical record under "Patient Instructions". Document created using STT-dictation technology, any transcriptional errors that may result from process are unintentional.    Patient: Loretta Webb  Service Category: Procedure Provider: Gillis Santa, MD DOB: 01-26-1932 DOS: 04/14/2021 Location: [facility] MRN: [id number] Setting: Ambulatory - outpatient Referring Provider: Rusty Aus, MD Type: Established Patient Specialty: Interventional Pain Management PCP: Rusty Aus, MD  Primary Reason for Visit: Interventional Pain Management Treatment. CC: Shoulder Pain (right) and Back Pain (lumbar)   Procedure:          Anesthesia, Analgesia, Anxiolysis:  Type: Therapeutic Inter-Laminar Epidural Steroid Injection           Region: Lumbar Level: L4-5 Level. Laterality: Midline         Anesthesia: Local (1-2% Lidocaine)  Anxiolysis: None  Sedation: None  Guidance: Fluoroscopy           Position: Prone with head of the table was raised to facilitate breathing.   Indications: 1. History of rotator cuff syndrome (bilateral)   2. Chronic radicular lumbar pain   3. Chronic right shoulder pain   4. Localized osteoarthritis of shoulder regions, bilateral   5. Chronic pain syndrome    Pain Score: Pre-procedure: 8 /10 Post-procedure:  (low back 0, shoulder 8)/10    Pre-op H&P Assessment:  Loretta Webb is a 87 y.o. (year old), female patient, seen today for interventional treatment. She  has a past surgical history that includes knee replacements; Abdominal hysterectomy; Rotator cuff repair; and Appendectomy. Loretta Webb has a current medication list which includes the following prescription(s): acetaminophen, aspirin,  cholecalciferol, vitamin b-12, diclofenac sodium, digoxin, donepezil, duloxetine, magnesium, melatonin, metoprolol tartrate, midodrine, mirtazapine, pantoprazole, polyethylene glycol, senna-docusate, pravastatin, and quetiapine. Her primarily concern today is the Shoulder Pain (right) and Back Pain (lumbar)  Initial Vital Signs:  Pulse/HCG Rate: 89ECG Heart Rate: 94 Temp:  (!) 97 F (36.1 C) Resp: 16 BP: 116/86 SpO2: 96 %  BMI: Estimated body mass index is 25.51 kg/m as calculated from the following:   Height as of this encounter: 5\' 3"  (1.6 m).   Weight as of this encounter: 144 lb (65.3 kg).  Risk Assessment: Allergies: Reviewed. She has No Known Allergies.  Allergy Precautions: None required Coagulopathies: Reviewed. None identified.  Blood-thinner therapy: None at this time Active Infection(s): Reviewed. None identified. Loretta Webb is afebrile  Site Confirmation: Loretta Webb was asked to confirm the procedure and laterality before marking the site Procedure checklist: Completed Consent: Before the procedure and under the influence of no sedative(s), amnesic(s), or anxiolytics, the patient was informed of the treatment options, risks and possible complications. To fulfill our ethical and legal obligations, as recommended by the American Medical Association's Code of Ethics, I have informed the patient of my clinical impression; the nature and purpose of the treatment or procedure; the risks, benefits, and possible complications of the intervention; the alternatives, including doing nothing; the risk(s) and benefit(s) of the alternative treatment(s) or procedure(s); and the risk(s) and benefit(s) of doing nothing. The patient was provided information about the general risks and possible complications associated with the procedure. These may include, but are not limited to: failure to achieve desired goals, infection, bleeding, organ or nerve damage, allergic reactions, paralysis, and  death. In addition, the patient  was informed of those risks and complications associated to Spine-related procedures, such as failure to decrease pain; infection (i.e.: Meningitis, epidural or intraspinal abscess); bleeding (i.e.: epidural hematoma, subarachnoid hemorrhage, or any other type of intraspinal or peri-dural bleeding); organ or nerve damage (i.e.: Any type of peripheral nerve, nerve root, or spinal cord injury) with subsequent damage to sensory, motor, and/or autonomic systems, resulting in permanent pain, numbness, and/or weakness of one or several areas of the body; allergic reactions; (i.e.: anaphylactic reaction); and/or death. Furthermore, the patient was informed of those risks and complications associated with the medications. These include, but are not limited to: allergic reactions (i.e.: anaphylactic or anaphylactoid reaction(s)); adrenal axis suppression; blood sugar elevation that in diabetics may result in ketoacidosis or comma; water retention that in patients with history of congestive heart failure may result in shortness of breath, pulmonary edema, and decompensation with resultant heart failure; weight gain; swelling or edema; medication-induced neural toxicity; particulate matter embolism and blood vessel occlusion with resultant organ, and/or nervous system infarction; and/or aseptic necrosis of one or more joints. Finally, the patient was informed that Medicine is not an exact science; therefore, there is also the possibility of unforeseen or unpredictable risks and/or possible complications that may result in a catastrophic outcome. The patient indicated having understood very clearly. We have given the patient no guarantees and we have made no promises. Enough time was given to the patient to ask questions, all of which were answered to the patient's satisfaction. Loretta Webb has indicated that she wanted to continue with the procedure. Attestation: I, the ordering provider,  attest that I have discussed with the patient the benefits, risks, side-effects, alternatives, likelihood of achieving goals, and potential problems during recovery for the procedure that I have provided informed consent. Date  Time: 04/14/2021 11:08 AM  Pre-Procedure Preparation:  Monitoring: As per clinic protocol. Respiration, ETCO2, SpO2, BP, heart rate and rhythm monitor placed and checked for adequate function Safety Precautions: Patient was assessed for positional comfort and pressure points before starting the procedure. Time-out: I initiated and conducted the "Time-out" before starting the procedure, as per protocol. The patient was asked to participate by confirming the accuracy of the "Time Out" information. Verification of the correct person, site, and procedure were performed and confirmed by me, the nursing staff, and the patient. "Time-out" conducted as per Joint Commission's Universal Protocol (UP.01.01.01). Time: 1200  Description of Procedure:          Target Area: The interlaminar space, initially targeting the lower laminar border of the superior vertebral body. Approach: Paramedial approach. Area Prepped: Entire Posterior Lumbar Region DuraPrep (Iodine Povacrylex [0.7% available iodine] and Isopropyl Alcohol, 74% w/w) Safety Precautions: Aspiration looking for blood return was conducted prior to all injections. At no point did we inject any substances, as a needle was being advanced. No attempts were made at seeking any paresthesias. Safe injection practices and needle disposal techniques used. Medications properly checked for expiration dates. SDV (single dose vial) medications used. Description of the Procedure: Protocol guidelines were followed. The procedure needle was introduced through the skin, ipsilateral to the reported pain, and advanced to the target area. Bone was contacted and the needle walked caudad, until the lamina was cleared. The epidural space was identified  using "loss-of-resistance technique" with 2-3 ml of PF-NaCl (0.9% NSS), in a 5cc LOR glass syringe.  Vitals:   04/14/21 1121 04/14/21 1200 04/14/21 1205 04/14/21 1208  BP: 116/86 133/85 137/76 138/75  Pulse: 89  Resp: 16 16 18 16   Temp: (!) 97 F (36.1 C)     TempSrc: Temporal     SpO2: 96% 98% 99% 99%  Weight: 144 lb (65.3 kg)     Height: 5\' 3"  (1.6 m)       Start Time: 1200 hrs. End Time: 1207 hrs.  Materials:  Needle(s) Type: Epidural needle Gauge: 22G Length: 3.5-in Medication(s): Please see orders for medications and dosing details. 6 cc solution made of 3 cc of preservative-free saline, 2 cc of 0.2% ropivacaine, 1 cc of Decadron 10 mg/cc.  Imaging Guidance (Spinal):          Type of Imaging Technique: Fluoroscopy Guidance (Spinal) Indication(s): Assistance in needle guidance and placement for procedures requiring needle placement in or near specific anatomical locations not easily accessible without such assistance. Exposure Time: Please see nurses notes. Contrast: Before injecting any contrast, we confirmed that the patient did not have an allergy to iodine, shellfish, or radiological contrast. Once satisfactory needle placement was completed at the desired level, radiological contrast was injected. Contrast injected under live fluoroscopy. No contrast complications. See chart for type and volume of contrast used. Fluoroscopic Guidance: I was personally present during the use of fluoroscopy. "Tunnel Vision Technique" used to obtain the best possible view of the target area. Parallax error corrected before commencing the procedure. "Direction-depth-direction" technique used to introduce the needle under continuous pulsed fluoroscopy. Once target was reached, antero-posterior, oblique, and lateral fluoroscopic projection used confirm needle placement in all planes. Images permanently stored in EMR. Interpretation: I personally interpreted the imaging intraoperatively. Adequate  needle placement confirmed in multiple planes. Appropriate spread of contrast into desired area was observed. No evidence of afferent or efferent intravascular uptake. No intrathecal or subarachnoid spread observed. Permanent images saved into the patient's record.   Post-operative Assessment:  Post-procedure Vital Signs:  Pulse/HCG Rate: 89100 Temp:  (!) 97 F (36.1 C) Resp: 16 BP: 138/75 SpO2: 99 %  EBL: None  Complications: No immediate post-treatment complications observed by team, or reported by patient.  Note: The patient tolerated the entire procedure well. A repeat set of vitals were taken after the procedure and the patient was kept under observation following institutional policy, for this type of procedure. Post-procedural neurological assessment was performed, showing return to baseline, prior to discharge. The patient was provided with post-procedure discharge instructions, including a section on how to identify potential problems. Should any problems arise concerning this procedure, the patient was given instructions to immediately contact us, at any time, without hesitation. In any case, we plan to contact the patient by telephone for a follow-up status report regarding this interventional procedure.  Comments:  No additional relevant information.  Plan of Care  Orders:  Orders Placed This Encounter  Procedures   DG PAIN CLIN[facility] C-ARM 1-60 MIN NO REPORT    Intraoperative interpretation by procedural physician at [place].    Standing Status:   Standing    Number of Occurrences:   1    Order Specific Question:   Reason for exam:    Answer:   Assistance in needle guidance and placement for procedures requiring needle placement in or near specific anatomical locations not easily accessible without such assistance.     Medications ordered for procedure: Meds ordered this encounter  Medications   iopamidol (ISOVUE-M) 41 % intrathecal injection 10 mL     Must be Myelogram-compatible. If not available, you may substitute with a water-soluble, non-ionic, hypoallergenic, myelogram-compatible radiological contrast medium.  lidocaine (XYLOCAINE) 2 % (with pres) injection 400 mg   ropivacaine (PF) 2 mg/mL (0.2%) (NAROPIN) injection 4 mL   methylPREDNISolone acetate (DEPO-MEDROL) injection 80 mg   dexamethasone (DECADRON) injection 10 mg   sodium chloride flush (NS) 0.9 % injection 2 mL   Medications administered: We administered iopamidol, lidocaine, ropivacaine (PF) 2 mg/mL (0.2%), methylPREDNISolone acetate, dexamethasone, and sodium chloride flush.  See the medical record for exact dosing, route, and time of administration.  Follow-up plan:   Return in about 8 weeks (around 06/09/2021) for Post Procedure Evaluation, virtual.     Recent Visits Date Type Provider Dept  04/06/21 Office Visit Edward Jolly, MD [facility]  Showing recent visits within past 90 days and meeting all other requirements Today's Visits Date Type Provider Dept  04/14/21 Procedure visit Edward Jolly, MD [facility]  Showing today's visits and meeting all other requirements Future Appointments Date Type Provider Dept  06/09/21 Appointment Edward Jolly, MD [facility]  Showing future appointments within next 90 days and meeting all other requirements Disposition: Discharge home  Discharge (Date  Time): 04/14/2021; 1220 hrs.   Primary Care Physician: [name], MD Location: [facility] Note by: Edward Jolly, MD Date: 04/14/2021; Time: 1:08 PM  Disclaimer:  Medicine is not an exact science. The only guarantee in medicine is that nothing is guaranteed. It is important to note that the decision to proceed with this intervention was based on the information collected from the patient. The Data and conclusions were drawn from the patient's questionnaire, the interview, and the physical examination. Because  the information was provided in large part by the patient, it cannot be guaranteed that it has not been purposely or unconsciously manipulated. Every effort has been made to obtain as much relevant data as possible for this evaluation. It is important to note that the conclusions that lead to this procedure are derived in large part from the available data. Always take into account that the treatment will also be dependent on availability of resources and existing treatment guidelines, considered by other Pain Management Practitioners as being common knowledge and practice, at the time of the intervention. For Medico-Legal purposes, it is also important to point out that variation in procedural techniques and pharmacological choices are the acceptable norm. The indications, contraindications, technique, and results of the above procedure should only be interpreted and judged by a Board-Certified Interventional Pain Specialist with extensive familiarity and expertise in the same exact procedure and technique.

## 2021-04-15 ENCOUNTER — Telehealth: Payer: Self-pay | Admitting: *Deleted

## 2021-04-15 NOTE — Telephone Encounter (Signed)
Post procedure call, spoke with patient's husband.  He asked if Loretta Webb, daughter, could call us back?  Explained that if there were any concerns she certainly could call us back if not we would see her at next appt.

## 2021-04-19 ENCOUNTER — Encounter: Payer: Self-pay | Admitting: Student in an Organized Health Care Education/Training Program

## 2021-04-20 ENCOUNTER — Telehealth: Payer: Self-pay | Admitting: Student in an Organized Health Care Education/Training Program

## 2021-04-20 ENCOUNTER — Other Ambulatory Visit: Payer: Self-pay | Admitting: Student in an Organized Health Care Education/Training Program

## 2021-04-20 DIAGNOSIS — G894 Chronic pain syndrome: Secondary | ICD-10-CM

## 2021-04-20 MED ORDER — TRAMADOL HCL 50 MG PO TABS: 50.0000 mg | ORAL_TABLET | Freq: Two times a day (BID) | ORAL | 1 refills | Status: AC | PRN

## 2021-04-20 NOTE — Telephone Encounter (Signed)
I have sent a bubble to Dr. Cherylann Ratel asking him to change her to Tramadol.

## 2021-04-29 ENCOUNTER — Telehealth: Payer: Self-pay | Admitting: Student in an Organized Health Care Education/Training Program

## 2021-04-29 DIAGNOSIS — T402X5A Adverse effect of other opioids, initial encounter: Secondary | ICD-10-CM

## 2021-04-29 MED ORDER — NALOXEGOL OXALATE 12.5 MG PO TABS: 12.5000 mg | ORAL_TABLET | Freq: Every day | ORAL | 2 refills | Status: AC

## 2021-04-29 NOTE — Telephone Encounter (Signed)
Daughter called stating new meds are not helping shoulder pain. Old meds did not give much relief. Patient is in pain of 8 or higher constantly. Please call with solution

## 2021-04-29 NOTE — Telephone Encounter (Signed)
Per daughter, patient will not take hydrocodone because it causes severe constipation.  She is currently using dulcolax, docusate sodium and miralax.   Still has severe constipation.  She has had a bad experience with a blocked bowel and this scares her as well.   We talked about medication, Movantik.  I told her I would check with Dr Cherylann Ratel to see if this would be a viable option.    She is taking Tramadol but it is not working.  She is still hesitant to take hydrocodone even the pain is a 7 - 10 every day.  Has not had the hydro in 2 weeks.  Continues to take Tramadol even though it is not working.

## 2021-04-29 NOTE — Telephone Encounter (Signed)
Voicemail left with Eunice Blase that he is going to send in [facility] [place] [facility] for her to try.  Encouraged plenty of fluids to keep things moving.  If this helps her constipation then maybe she will be willing to try the hydrocodone for her pain.

## 2021-04-30 ENCOUNTER — Telehealth: Payer: Self-pay | Admitting: Student in an Organized Health Care Education/Training Program

## 2021-04-30 NOTE — Telephone Encounter (Signed)
Loretta Webb lvmail stating they are not going to pick up the Movantik due to all the possible side effects and the family does not feel [place]ai would do well on this medication. Please cancel this script that was sent in.  FYI

## 2021-05-04 NOTE — Telephone Encounter (Signed)
Pharmacy does not open until 9

## 2021-05-04 NOTE — Telephone Encounter (Signed)
Noted  

## 2021-05-31 ENCOUNTER — Other Ambulatory Visit: Payer: Self-pay

## 2021-05-31 ENCOUNTER — Other Ambulatory Visit: Payer: Medicare Other

## 2021-06-03 NOTE — Progress Notes (Incomplete)
PATIENT NAME: Loretta Webb DOB: 05-29-1931 MRN: [id number]  PRIMARY CARE PROVIDER: Rusty Aus, MD  RESPONSIBLE PARTY:  Acct ID - Guarantor Home Phone Work Phone Relationship Acct Type  [id number] [id number]  Self P/F     [address], [place],  [address]    PLAN OF CARE and INTERVENTIONS:               1.  GOALS OF CARE/ ADVANCE CARE PLANNING:  ***               2.  PATIENT/CAREGIVER EDUCATION:  ***               4. PERSONAL EMERGENCY PLAN:  ***               5.  DISEASE STATUS:***    HISTORY OF PRESENT ILLNESS:    CODE STATUS:   Code Status: Prior  ADVANCED DIRECTIVES: N MOST FORM: {Responses; yes/no} PPS: {NUMBERS 0%-100%:21292}   PHYSICAL EXAM:   VITALS:There were no vitals filed for this visit.  LUNGS: {SYSTEM LUNGS ADULT/PED ZQ:[id number] CARDIAC: {Mis exam cardio:32073}} *** EXTREMITIES: {Exam; extremity:10330} SKIN: {Findings; skin exam-one line:31329::"Skin color, texture, turgor normal. No rashes or lesions"}  NEURO: {Findings; ROS neuro:30532::"negative"}       Cornelius Moras, RN

## 2021-06-09 ENCOUNTER — Encounter: Payer: Self-pay | Admitting: Student in an Organized Health Care Education/Training Program

## 2021-06-09 ENCOUNTER — Other Ambulatory Visit: Payer: Self-pay

## 2021-06-09 ENCOUNTER — Ambulatory Visit
Payer: Medicare Other | Attending: Student in an Organized Health Care Education/Training Program | Admitting: Student in an Organized Health Care Education/Training Program

## 2021-06-09 DIAGNOSIS — M5416 Radiculopathy, lumbar region: Secondary | ICD-10-CM

## 2021-06-09 DIAGNOSIS — G894 Chronic pain syndrome: Secondary | ICD-10-CM | POA: Diagnosis not present

## 2021-06-09 DIAGNOSIS — Z8739 Personal history of other diseases of the musculoskeletal system and connective tissue: Secondary | ICD-10-CM | POA: Diagnosis not present

## 2021-06-09 DIAGNOSIS — G8929 Other chronic pain: Secondary | ICD-10-CM

## 2021-06-09 DIAGNOSIS — M25511 Pain in right shoulder: Secondary | ICD-10-CM

## 2021-06-09 NOTE — Progress Notes (Signed)
Patient: Loretta Webb  Service Category: E/M  Provider: Gillis Santa, MD  DOB: May 23, 1931  DOS: 06/09/2021  Location: Office  MRN: [id number]  Setting: Ambulatory outpatient  Referring Provider: Rusty Aus, MD  Type: Established Patient  Specialty: Interventional Pain Management  PCP: Rusty Aus, MD  Location: Remote location  Delivery: TeleHealth     Virtual Encounter - Pain Management PROVIDER NOTE: Information contained herein reflects review and annotations entered in association with encounter. Interpretation of such information and data should be left to medically-trained personnel. Information provided to patient can be located elsewhere in the medical record under "Patient Instructions". Document created using STT-dictation technology, any transcriptional errors that may result from process are unintentional.    Contact & Pharmacy Preferred: [phone] Home: [phone] (home) Mobile: There is no such number on file (mobile). E-mail: [email or web address], [place] [address] Phone: [phone] Fax: [phone]  [facility] [id number] [place], [place] [facility] AT [address] [place] [address] Phone: [phone] Fax: [phone]  [place], [place] [address] [place] [address] Phone: [phone] Fax: [phone]   Pre-screening  Loretta Webb offered "in-person" vs "virtual" encounter. She indicated preferring virtual for this encounter.   Reason COVID-19*   Social distancing based on CDC and AMA recommendations.   I contacted [place] on 06/09/2021 via telephone.      I clearly identified myself as Gillis Santa, MD. I verified that I was speaking with the correct person using two identifiers (Name: Loretta Webb, and date of birth: 1931-06-24).  Consent I sought verbal advanced  consent from Loretta Webb for virtual visit interactions. I informed Loretta Webb of possible security and privacy concerns, risks, and limitations associated with providing "not-in-person" medical evaluation and management services. I also informed Loretta Webb of the availability of "in-person" appointments. Finally, I informed her that there would be a charge for the virtual visit and that she could be  personally, fully or partially, financially responsible for it. Loretta Webb expressed understanding and agreed to proceed.   Historic Elements   Loretta Webb is a 88 y.o. year old, female patient evaluated today after our last contact on 04/30/2021. Loretta Webb  has a past medical history of Arthritis, Chronic fatigue, DM2 (diabetes mellitus, type 2) ([place]), Esophageal stricture, Fibromyalgia, GERD (gastroesophageal reflux disease), Hypertension, Lumbar stenosis, Osteoporosis, and Sleep apnea. She also  has a past surgical history that includes knee replacements; Abdominal hysterectomy; Rotator cuff repair; and Appendectomy. Loretta Webb has a current medication list which includes the following prescription(s): acetaminophen, aspirin, cholecalciferol, vitamin b-12, diclofenac sodium, digoxin, donepezil, duloxetine, magnesium, melatonin, metoprolol tartrate, midodrine, mirtazapine, pantoprazole, polyethylene glycol, pravastatin, quetiapine, naloxegol oxalate, senna-docusate, and tramadol. She  reports that she has never smoked. She has never used smokeless tobacco. She reports that she does not drink alcohol and does not use drugs. Loretta Webb has No Known Allergies.   HPI  Today, she is being contacted for a post-procedure assessment.   Post-procedure evaluation     Procedure: 1         Anesthesia, Analgesia, Anxiolysis:  Type: Therapeutic Inter-Laminar Epidural Steroid Injection           Region: Lumbar Level: L4-5 Level. Laterality: Midline           Anesthesia:  Local (1-2% Lidocaine)   Anxiolysis: None  Sedation: None  Guidance: Fluoroscopy           Position: Prone with head of the table was raised to facilitate breathing.   Procedure:  2        Type: Palliative Suprascapular nerve Block #3          Indications: 1. History of rotator cuff syndrome (bilateral)   2. Chronic radicular lumbar pain   3. Chronic right shoulder pain   4. Localized osteoarthritis of shoulder regions, bilateral   5. Chronic pain syndrome    Pain Score: Pre-procedure: 8 /10 Post-procedure:  (low back 0, shoulder 8)/10     Effectiveness:  Initial hour after procedure: 100 %  Subsequent 4-6 hours post-procedure: 100 %  Analgesia past initial 6 hours: 100 % (right shoulder no relief.)  Ongoing improvement:  Analgesic:  80% low back, 0 % shoulder    Laboratory Chemistry Profile   Renal Lab Results  Component Value Date   BUN 21 01/06/2021   CREATININE 0.96 01/07/2021   BCR 10 (L) 09/03/2019   GFRAA 38 (L) 09/03/2019   GFRNONAA 57 (L) 01/07/2021    Hepatic Lab Results  Component Value Date   AST 27 01/06/2021   ALT 11 01/06/2021   ALBUMIN 3.8 01/06/2021   ALKPHOS 43 01/06/2021   LIPASE 42 09/11/2015    Electrolytes Lab Results  Component Value Date   NA 136 01/06/2021   K 3.6 01/06/2021   CL 101 01/06/2021   CALCIUM 9.0 01/06/2021   MG 2.4 01/06/2021    Bone Lab Results  Component Value Date   25OHVITD1 39 09/03/2019   25OHVITD2 3.4 09/03/2019   25OHVITD3 36 09/03/2019    Inflammation (CRP: Acute Phase) (ESR: Chronic Phase) Lab Results  Component Value Date   CRP <1 09/03/2019   ESRSEDRATE 17 09/03/2019   LATICACIDVEN 1.3 01/08/2021         Note: Above Lab results reviewed.   Assessment  The primary encounter diagnosis was History of rotator cuff syndrome (bilateral). Diagnoses of Chronic radicular lumbar pain, Chronic right shoulder pain, and Chronic pain syndrome were also pertinent to this visit.  Plan of Care   Lumbar radicular pain: Good  response after L4-L5 epidural steroid injection, repeat as needed Right shoulder pain due to severe right shoulder osteoarthritis, suprascapular nerve entrapment: No benefit after right suprascapular nerve block.  Patient is also tried glenohumeral steroid joint injections without any benefit.  At this point, do not have any additional interventional options for her right shoulder pain. Patient continues hydrocodone as needed for breakthrough pain Follow-up as needed for medication refill or repeat lumbar epidural steroid injection  Follow-up plan:   Return if symptoms worsen or fail to improve.      Recent Visits Date Type Provider Dept  04/14/21 Procedure visit Gillis Santa, MD [facility]  04/06/21 Office Visit Gillis Santa, MD [facility]  Showing recent visits within past 90 days and meeting all other requirements Today's Visits Date Type Provider Dept  06/09/21 Office Visit Gillis Santa, MD [facility]  Showing today's visits and meeting all other requirements Future Appointments No visits were found meeting these conditions. Showing future appointments within next 90 days and meeting all other requirements  I discussed the assessment and treatment plan with the patient. The patient was provided an opportunity to ask questions and all were answered. The patient agreed with the plan and demonstrated an understanding of the  instructions.  Patient advised to call back or seek an in-person evaluation if the symptoms or condition worsens.  Duration of encounter: 41minutes.  Note by: Gillis Santa, MD Date: 06/09/2021; Time: 3:44 PM

## 2021-07-01 ENCOUNTER — Other Ambulatory Visit: Payer: Self-pay

## 2021-07-01 ENCOUNTER — Encounter: Payer: Self-pay | Admitting: Student in an Organized Health Care Education/Training Program

## 2021-07-01 ENCOUNTER — Ambulatory Visit
Payer: Medicare Other | Attending: Student in an Organized Health Care Education/Training Program | Admitting: Student in an Organized Health Care Education/Training Program

## 2021-07-01 VITALS — BP 124/89 | Temp 96.9°F | Resp 14 | Ht 62.0 in | Wt 144.0 lb

## 2021-07-01 DIAGNOSIS — M25511 Pain in right shoulder: Secondary | ICD-10-CM | POA: Diagnosis not present

## 2021-07-01 DIAGNOSIS — Z8739 Personal history of other diseases of the musculoskeletal system and connective tissue: Secondary | ICD-10-CM | POA: Insufficient documentation

## 2021-07-01 DIAGNOSIS — G8929 Other chronic pain: Secondary | ICD-10-CM | POA: Diagnosis present

## 2021-07-01 DIAGNOSIS — M25532 Pain in left wrist: Secondary | ICD-10-CM | POA: Diagnosis not present

## 2021-07-01 DIAGNOSIS — G894 Chronic pain syndrome: Secondary | ICD-10-CM | POA: Insufficient documentation

## 2021-07-01 DIAGNOSIS — M5416 Radiculopathy, lumbar region: Secondary | ICD-10-CM | POA: Insufficient documentation

## 2021-07-01 NOTE — Progress Notes (Signed)
Safety precautions to be maintained throughout the outpatient stay will include: orient to surroundings, keep bed in low position, maintain call bell within reach at all times, provide assistance with transfer out of bed and ambulation.  

## 2021-07-01 NOTE — Progress Notes (Signed)
PROVIDER NOTE: Information contained herein reflects review and annotations entered in association with encounter. Interpretation of such information and data should be left to medically-trained personnel. Information provided to patient can be located elsewhere in the medical record under "Patient Instructions". Document created using STT-dictation technology, any transcriptional errors that may result from process are unintentional.    Patient: Loretta Webb  Service Category: E/M  Provider: Gillis Santa, MD  DOB: 1931/11/09  DOS: 07/01/2021  Specialty: Interventional Pain Management  MRN: [id number]  Setting: Ambulatory outpatient  PCP: Rusty Aus, MD  Type: Established Patient    Referring Provider: Rusty Aus, MD  Location: Office  Delivery: Face-to-face     HPI  Loretta Webb, a 88 y.o. year old female, is here today because of her Left wrist pain [M25.532]. She also has B12 deficiency; Benign essential HTN; DDD (degenerative disc disease), cervical; Type 2 diabetes mellitus ([place]); Cephalalgia; Neuritis or radiculitis due to rupture of lumbar intervertebral disc; Lumbar canal stenosis; OSA (obstructive sleep apnea); DM type 2 with diabetic mixed hyperlipidemia ([place]); Hyperlipidemia, mixed; Low serum vitamin D; Nocturnal hypoxia; Lumbar central spinal stenosis w/ neurogenic claudication; Chronic pain syndrome; Pharmacologic therapy; Disorder of skeletal system; Problems influencing health status; Abnormal MRI, lumbar spine (01/13/2019); Abnormal MRI, cervical spine (2014); Right hip pain; Chronic low back pain (1ry area of Pain) (Bilateral) (R>L) w/o sciatica; Chronic right shoulder pain; Chronic lower extremity pain (4th area of Pain) (Bilateral) (R>L); Diabetic peripheral neuropathy ([place]); Neurogenic pain; Lumbar facet syndrome (Bilateral) (R>L); Lumbar facet hypertrophy; DDD (degenerative disc disease), lumbosacral; Grade 1 Anterolisthesis of L3/L4; Localized osteoarthritis of shoulder  regions, bilateral; History of rotator cuff syndrome (bilateral); Primary osteoarthritis of right hip; Primary osteoarthritis of left hip; Chronic radicular lumbar pain; SI joint arthritis; Syncope and collapse; Atrial fibrillation with rapid ventricular response ([place]); Dehydration; Hypotension; Hypokalemia; Lactic acidosis; and Left wrist pain on their problem list. Loretta Webb's primary complain today is Wrist Pain (left) Last encounter: My last encounter with her was on 06/09/2021.  Pain Assessment: Severity of Chronic pain is reported as a 6 /10. Location: Wrist Left/pain and tingling in fingers. Onset: More than a month ago. Quality: Shooting. Timing: Intermittent. Modifying factor(s): medication. Vitals:  height is $RemoveB'5\' 2"'HuANBBWT$  (1.575 m) and weight is 144 lb (65.3 kg). Her temporal temperature is 96.9 F (36.1 C) (abnormal). Her blood pressure is 124/89. Her respiration is 14 and oxygen saturation is 97%.   Reason for encounter:  Patient presents today with increased left wrist pain with associated swelling.  No inciting or traumatic event.  She also has some tingling in her fingers.  ROS  Constitutional: Denies any fever or chills Gastrointestinal: No reported hemesis, hematochezia, vomiting, or acute GI distress Musculoskeletal:  left wrist pain Neurological: No reported episodes of acute onset apraxia, aphasia, dysarthria, agnosia, amnesia, paralysis, loss of coordination, or loss of consciousness  Medication Review  Cholecalciferol, DULoxetine, Magnesium, Melatonin, QUEtiapine, Vitamin B-12, acetaminophen, aspirin, diclofenac Sodium, digoxin, donepezil, metoprolol tartrate, midodrine, mirtazapine, naloxegol oxalate, pantoprazole, polyethylene glycol, pravastatin, and senna-docusate  History Review  Allergy: Loretta Webb has No Known Allergies. Drug: Loretta Webb  reports no history of drug use. Alcohol:  reports no history of alcohol use. Tobacco:  reports that she has never smoked. She has  never used smokeless tobacco. Social: Loretta Webb  reports that she has never smoked. She has never used smokeless tobacco. She reports that she does not drink alcohol and does not use drugs. Medical:  has  a past medical history of Arthritis, Chronic fatigue, DM2 (diabetes mellitus, type 2) ([place]), Esophageal stricture, Fibromyalgia, GERD (gastroesophageal reflux disease), Hypertension, Lumbar stenosis, Osteoporosis, and Sleep apnea. Surgical: Loretta Webb  has a past surgical history that includes knee replacements; Abdominal hysterectomy; Rotator cuff repair; and Appendectomy. Family: family history includes Cancer in her father, mother, and sister; Cerebral aneurysm in her father; Depression in her sister; Diabetes in her father.  Laboratory Chemistry Profile   Renal Lab Results  Component Value Date   BUN 21 01/06/2021   CREATININE 0.96 01/07/2021   BCR 10 (L) 09/03/2019   GFRAA 38 (L) 09/03/2019   GFRNONAA 57 (L) 01/07/2021    Hepatic Lab Results  Component Value Date   AST 27 01/06/2021   ALT 11 01/06/2021   ALBUMIN 3.8 01/06/2021   ALKPHOS 43 01/06/2021   LIPASE 42 09/11/2015    Electrolytes Lab Results  Component Value Date   NA 136 01/06/2021   K 3.6 01/06/2021   CL 101 01/06/2021   CALCIUM 9.0 01/06/2021   MG 2.4 01/06/2021    Bone Lab Results  Component Value Date   25OHVITD1 39 09/03/2019   25OHVITD2 3.4 09/03/2019   25OHVITD3 36 09/03/2019    Inflammation (CRP: Acute Phase) (ESR: Chronic Phase) Lab Results  Component Value Date   CRP <1 09/03/2019   ESRSEDRATE 17 09/03/2019   LATICACIDVEN 1.3 01/08/2021         Note: Above Lab results reviewed.  Recent Imaging Review  DG PAIN CLINIC C-ARM 1-60 MIN NO REPORT Fluoro was used, but no Radiologist interpretation will be provided.  Please refer to "NOTES" tab for provider progress note. Note: Reviewed        Physical Exam  General appearance: Well nourished, well developed, and well hydrated. In no  apparent acute distress Mental status: Alert, oriented x 3 (person, place, & time)       Respiratory: No evidence of acute respiratory distress Eyes: PERLA Vitals: BP 124/89    Temp (!) 96.9 F (36.1 C) (Temporal)    Resp 14    Ht $R'5\' 2"'tE$  (1.575 m)    Wt 144 lb (65.3 kg)    LMP  (LMP Unknown)    SpO2 97%    BMI 26.34 kg/m  BMI: Estimated body mass index is 26.34 kg/m as calculated from the following:   Height as of this encounter: $RemoveBeforeD'5\' 2"'WloATssEfFqenx$  (1.575 m).   Weight as of this encounter: 144 lb (65.3 kg). Ideal: Ideal body weight: 50.1 kg (110 lb 7.2 oz) Adjusted ideal body weight: 56.2 kg (123 lb 13.9 oz)  Left Hand Exam   Range of Motion  Wrist  Extension:  abnormal  Flexion:  abnormal  Pronation:  abnormal  Supination:  abnormal   Muscle Strength  Wrist extension: 4/5  Wrist flexion: 4/5  Grip:  3/5   Other  Sensation: decreased Pulse: present      Assessment   Status Diagnosis  Having a Flare-up Controlled Controlled 1. Left wrist pain   2. History of rotator cuff syndrome (bilateral)   3. Chronic radicular lumbar pain   4. Chronic right shoulder pain   5. Chronic pain syndrome      Updated Problems: Problem  Left Wrist Pain    Plan of Care  Problem-specific:  Left wrist pain Continue with APAP Patient already on cymbalta Cannot tolerate NSAIDs Takes Hyrdocodone prn which I manage Recommend heat to left wrist and voltaren gel application.    Follow-up plan:  Return if symptoms worsen or fail to improve.    Recent Visits Date Type Provider Dept  06/09/21 Office Visit Gillis Santa, MD [facility]  04/14/21 Procedure visit Gillis Santa, MD [facility]  04/06/21 Office Visit Gillis Santa, MD [facility]  Showing recent visits within past 90 days and meeting all other requirements Today's Visits Date Type Provider Dept  07/01/21 Office Visit Gillis Santa, MD [facility]  Showing today's visits and meeting all  other requirements Future Appointments No visits were found meeting these conditions. Showing future appointments within next 90 days and meeting all other requirements  I discussed the assessment and treatment plan with the patient. The patient was provided an opportunity to ask questions and all were answered. The patient agreed with the plan and demonstrated an understanding of the instructions.  Patient advised to call back or seek an in-person evaluation if the symptoms or condition worsens.  Duration of encounter: 66minutes.  Note by: Gillis Santa, MD Date: 07/01/2021; Time: 1:23 PM

## 2021-07-01 NOTE — Assessment & Plan Note (Signed)
Continue with APAP Patient already on cymbalta Cannot tolerate NSAIDs Takes Hyrdocodone prn which I manage Recommend heat to left wrist and voltaren gel application.

## 2021-07-06 ENCOUNTER — Ambulatory Visit: Payer: Medicare Other | Admitting: Student in an Organized Health Care Education/Training Program

## 2021-08-12 ENCOUNTER — Other Ambulatory Visit: Payer: Medicare Other

## 2021-08-12 DIAGNOSIS — Z515 Encounter for palliative care: Secondary | ICD-10-CM

## 2021-08-12 NOTE — Progress Notes (Signed)
PATIENT NAME: Loretta Webb ?DOB: [birth year] ?MRN: [id number] ? ?PRIMARY CARE PROVIDER: [name], MD ? ?RESPONSIBLE PARTY:  ?Acct ID - Guarantor Home Phone Work Phone Relationship Acct Type  ?[id number] Loretta Webb* [phone]  Self P/F  ?   [address] [place], [place] [address] ?  ? ?I connected with  Loretta Webb on 08/12/21 by telephone and verified that I am speaking with the correct person using two identifiers. ?  ?I discussed the limitations of evaluation and management by telemedicine. The patient expressed understanding and agreed to proceed.  ? ?PLAN OF CARE and INTERVENTIONS: ?              1.  GOALS OF CARE/ ADVANCE CARE PLANNING:  Remain home with the support of family and caregivers.  ?              2.  PATIENT/CAREGIVER EDUCATION:  Palliative Care ?              4. PERSONAL EMERGENCY PLAN:  Activate 911 for emergencies.  ?              5.  DISEASE STATUS: ? ?Connected with daughter Loretta Webb to complete a telephonic visit.  Daughter advised patient is in a period of contentment as is the family.   ? ?Appetite:  Patient's appetite remains good and daughter does not believe there has been any weight loss.  Patient is still able to feed herself.  ? ?Dementia:  Patient continues on donepezil but daughter does not feel this is effective any longer.  She did not wish to stop this medication though.  Daughter notes patient continues with a cognitive decline.  Memory continues to worsen but they are not experiencing any behavioral issues at this time.  ? ?Generalized Weakness:  Patient has not experienced any further falls since her syncopal episode last year.  She does have a cane and walker but uses neither.  Daughter advised patient will likely not be compliant with this due to her memory issues.  ? ?Palliative Care:  Home visit offered but daughter declines at this time.  She advised that phone call check ins are good at this time.  She is aware patient will likely need additional support of  Palliative Care and she will contact us if a visit is needed.  ? ? ?HISTORY OF PRESENT ILLNESS:  Loretta Webb is a 88 y.o. year old female  with  dementia, OA, atrial fibrillation, hypertension, T2DM, anxiety, depression, rotator cuff syndrome, lumbar stenosis, osteoporosis.  Patient is being followed by Palliative Care every 8-12 weeks and PRN.  ? ?CODE STATUS: Full ?ADVANCED DIRECTIVES: No ?MOST FORM: No ?PPS: 50% ? ? ? ? ? ? ? ?Loretta Merle, RN ? ?

## 2021-09-06 ENCOUNTER — Telehealth: Payer: Self-pay | Admitting: Student in an Organized Health Care Education/Training Program

## 2021-09-06 DIAGNOSIS — G8929 Other chronic pain: Secondary | ICD-10-CM

## 2021-09-06 DIAGNOSIS — M48062 Spinal stenosis, lumbar region with neurogenic claudication: Secondary | ICD-10-CM

## 2021-09-06 DIAGNOSIS — G894 Chronic pain syndrome: Secondary | ICD-10-CM

## 2021-09-06 NOTE — Telephone Encounter (Signed)
Daughter Loretta Webb, called stating her mother Loretta Webb is having a lot of back pain down into left leg. Would like to come in on Monday the 8th or Wednesday  the 10th for some injections. Please advise if she needs consult first or can we go ahead and schedule a procedure and what kind of procedure?  ?

## 2021-09-13 ENCOUNTER — Ambulatory Visit (HOSPITAL_BASED_OUTPATIENT_CLINIC_OR_DEPARTMENT_OTHER): Payer: Medicare Other | Admitting: Student in an Organized Health Care Education/Training Program

## 2021-09-13 ENCOUNTER — Encounter: Payer: Self-pay | Admitting: Student in an Organized Health Care Education/Training Program

## 2021-09-13 ENCOUNTER — Ambulatory Visit
Admission: RE | Admit: 2021-09-13 | Discharge: 2021-09-13 | Disposition: A | Discharge status: Home / Self Care | Payer: Medicare Other | Source: Ambulatory Visit | Attending: Student in an Organized Health Care Education/Training Program | Admitting: Student in an Organized Health Care Education/Training Program

## 2021-09-13 VITALS — BP 110/64 | HR 81 | Temp 97.0°F | Resp 25 | Ht 62.0 in | Wt 140.0 lb

## 2021-09-13 DIAGNOSIS — G894 Chronic pain syndrome: Secondary | ICD-10-CM | POA: Insufficient documentation

## 2021-09-13 DIAGNOSIS — G8929 Other chronic pain: Secondary | ICD-10-CM | POA: Diagnosis present

## 2021-09-13 DIAGNOSIS — M48062 Spinal stenosis, lumbar region with neurogenic claudication: Secondary | ICD-10-CM | POA: Diagnosis present

## 2021-09-13 DIAGNOSIS — M5416 Radiculopathy, lumbar region: Secondary | ICD-10-CM

## 2021-09-13 MED ORDER — LIDOCAINE HCL 2 % IJ SOLN
20.0000 mL | Freq: Once | INTRAMUSCULAR | Status: AC
  Administered 2021-09-13: 400 mg

## 2021-09-13 MED ORDER — SODIUM CHLORIDE 0.9% FLUSH
1.0000 mL | Freq: Once | INTRAVENOUS | Status: AC
  Administered 2021-09-13: 1 mL

## 2021-09-13 MED ORDER — IOHEXOL 180 MG/ML  SOLN
10.0000 mL | Freq: Once | INTRAMUSCULAR | Status: AC
  Administered 2021-09-13: 5 mL via EPIDURAL

## 2021-09-13 MED ORDER — DEXAMETHASONE SODIUM PHOSPHATE 10 MG/ML IJ SOLN
INTRAMUSCULAR | Status: AC
  Filled 2021-09-13: qty 1

## 2021-09-13 MED ORDER — ROPIVACAINE HCL 2 MG/ML IJ SOLN
1.0000 mL | Freq: Once | INTRAMUSCULAR | Status: AC
Start: 2021-09-13 — End: 2021-09-13
  Administered 2021-09-13: 1 mL via EPIDURAL

## 2021-09-13 MED ORDER — ROPIVACAINE HCL 2 MG/ML IJ SOLN
INTRAMUSCULAR | Status: AC
  Filled 2021-09-13: qty 20

## 2021-09-13 MED ORDER — SODIUM CHLORIDE (PF) 0.9 % IJ SOLN
INTRAMUSCULAR | Status: AC
  Filled 2021-09-13: qty 10

## 2021-09-13 MED ORDER — DEXAMETHASONE SODIUM PHOSPHATE 10 MG/ML IJ SOLN
10.0000 mg | Freq: Once | INTRAMUSCULAR | Status: AC
  Administered 2021-09-13: 10 mg

## 2021-09-13 MED ORDER — LIDOCAINE HCL 2 % IJ SOLN
INTRAMUSCULAR | Status: AC
  Filled 2021-09-13: qty 20

## 2021-09-13 NOTE — Patient Instructions (Signed)

## 2021-09-13 NOTE — Progress Notes (Signed)
PROVIDER NOTE: Interpretation of information contained herein should be left to medically-trained personnel. Specific patient instructions are provided elsewhere under "Patient Instructions" section of medical record. This document was created in part using STT-dictation technology, any transcriptional errors that may result from this process are unintentional.  ?Patient: Loretta Webb ?Type: Established ?DOB: Jun 27, 1931 ?MRN: [id number] ?PCP: [name], MD  Service: Procedure ?DOS: 09/13/2021 ?Setting: Ambulatory ?Location: Ambulatory outpatient facility ?Delivery: Face-to-face Provider: Edward Jolly, MD ?Specialty: Interventional Pain Management ?Specialty designation: 09 ?Location: Outpatient facility ?Ref. Prov.: [name], MD   ? ?Primary Reason for Visit: Interventional Pain Management Treatment. ?CC: Back Pain (lower) ?  ?Procedure:          ? Type: Trans-Foraminal Epidural Steroid Injection (Lumbar) #1  ?Laterality: Left  ?Level: L5           ?Imaging: Fluoroscopic guidance ?Anesthesia: Local anesthesia (1-2% Lidocaine) ?Anxiolysis: None                 ?Sedation: None. ?DOS: 09/13/2021  ?Performed by: Edward Jolly, MD ? ?Purpose: Diagnostic/Therapeutic ?Indications: Lumbar radicular pain severe enough to impact quality of life or function. ?1. Chronic radicular lumbar pain   ?2. Lumbar stenosis with neurogenic claudication   ?3. Chronic pain syndrome   ? ?NAS-11 Pain score:  ? Pre-procedure: 2 /10  ? Post-procedure: 0-No pain (getting up//moving)/10  ? ?  ?Position / Prep / Materials:  ?Position: Prone  ?Prep solution: DuraPrep (Iodine Povacrylex [0.7% available iodine] and Isopropyl Alcohol, 74% w/w) ?Prep Area: Entire Posterior Lumbosacral Area.  From the lower tip of the scapula down to the tailbone and from flank to flank. ?Materials:  ?Tray: Block ?Needle(s):  ?Type: Spinal  ?Gauge (G): 22  ?Length: 3.5-in  ?Qty: 1 ? ?Pre-op H&P Assessment:  ?Loretta Webb is a 88 y.o. (year old), female patient,  seen today for interventional treatment. She  has a past surgical history that includes knee replacements; Abdominal hysterectomy; Rotator cuff repair; and Appendectomy. Loretta Webb has a current medication list which includes the following prescription(s): acetaminophen, aspirin, cholecalciferol, vitamin b-12, diclofenac sodium, digoxin, donepezil, duloxetine, magnesium, melatonin, metoprolol tartrate, midodrine, mirtazapine, pantoprazole, polyethylene glycol, quetiapine, senna-docusate, and pravastatin. Her primarily concern today is the Back Pain (lower) ? ?Initial Vital Signs:  ?Pulse/HCG Rate: 64  ?Temp:  ?(!) 97 ?F (36.1 ?C) ?Resp: 16 ?BP: 107/75 ?SpO2: 97 % ? ?BMI: Estimated body mass index is 25.61 kg/m? as calculated from the following: ?  Height as of this encounter: 5\' 2"  (1.575 m). ?  Weight as of this encounter: 140 lb (63.5 kg). ? ?Risk Assessment: ?Allergies: Reviewed. She has No Known Allergies.  ?Allergy Precautions: None required ?Coagulopathies: Reviewed. None identified.  ?Blood-thinner therapy: None at this time ?Active Infection(s): Reviewed. None identified. Loretta Webb is afebrile ? ?Site Confirmation: Loretta Webb was asked to confirm the procedure and laterality before marking the site ?Procedure checklist: Completed ?Consent: Before the procedure and under the influence of no sedative(s), amnesic(s), or anxiolytics, the patient was informed of the treatment options, risks and possible complications. To fulfill our ethical and legal obligations, as recommended by the American Medical Association's Code of Ethics, I have informed the patient of my clinical impression; the nature and purpose of the treatment or procedure; the risks, benefits, and possible complications of the intervention; the alternatives, including doing nothing; the risk(s) and benefit(s) of the alternative treatment(s) or procedure(s); and the risk(s) and benefit(s) of doing nothing. ?The patient was provided information  about the  general risks and possible complications associated with the procedure. These may include, but are not limited to: failure to achieve desired goals, infection, bleeding, organ or nerve damage, allergic reactions, paralysis, and death. ?In addition, the patient was informed of those risks and complications associated to Spine-related procedures, such as failure to decrease pain; infection (i.e.: Meningitis, epidural or intraspinal abscess); bleeding (i.e.: epidural hematoma, subarachnoid hemorrhage, or any other type of intraspinal or peri-dural bleeding); organ or nerve damage (i.e.: Any type of peripheral nerve, nerve root, or spinal cord injury) with subsequent damage to sensory, motor, and/or autonomic systems, resulting in permanent pain, numbness, and/or weakness of one or several areas of the body; allergic reactions; (i.e.: anaphylactic reaction); and/or death. ?Furthermore, the patient was informed of those risks and complications associated with the medications. These include, but are not limited to: allergic reactions (i.e.: anaphylactic or anaphylactoid reaction(s)); adrenal axis suppression; blood sugar elevation that in diabetics may result in ketoacidosis or comma; water retention that in patients with history of congestive heart failure may result in shortness of breath, pulmonary edema, and decompensation with resultant heart failure; weight gain; swelling or edema; medication-induced neural toxicity; particulate matter embolism and blood vessel occlusion with resultant organ, and/or nervous system infarction; and/or aseptic necrosis of one or more joints. ?Finally, the patient was informed that Medicine is not an exact science; therefore, there is also the possibility of unforeseen or unpredictable risks and/or possible complications that may result in a catastrophic outcome. The patient indicated having understood very clearly. We have given the patient no guarantees and we have made no  promises. Enough time was given to the patient to ask questions, all of which were answered to the patient's satisfaction. Loretta Webb has indicated that she wanted to continue with the procedure. ?Attestation: I, the ordering provider, attest that I have discussed with the patient the benefits, risks, side-effects, alternatives, likelihood of achieving goals, and potential problems during recovery for the procedure that I have provided informed consent. ?Date  Time: 09/13/2021  9:07 AM ? ?Pre-Procedure Preparation:  ?Monitoring: As per clinic protocol. Respiration, ETCO2, SpO2, BP, heart rate and rhythm monitor placed and checked for adequate function ?Safety Precautions: Patient was assessed for positional comfort and pressure points before starting the procedure. ?Time-out: I initiated and conducted the "Time-out" before starting the procedure, as per protocol. The patient was asked to participate by confirming the accuracy of the "Time Out" information. Verification of the correct person, site, and procedure were performed and confirmed by me, the nursing staff, and the patient. "Time-out" conducted as per Joint Commission's Universal Protocol (UP.01.01.01). ?Time: 0935 ? ?Description/Narrative of Procedure:          ?Target: The 6 o'clock position under the pedicle, on the affected side. ?Region: Posterolateral Lumbosacral ?Approach: Posterior Percutaneous Paravertebral approach. ? ?Rationale (medical necessity): procedure needed and proper for the diagnosis and/or treatment of the patient's medical symptoms and needs. ?Procedural Technique Safety Precautions: Aspiration looking for blood return was conducted prior to all injections. At no point did we inject any substances, as a needle was being advanced. No attempts were made at seeking any paresthesias. Safe injection practices and needle disposal techniques used. Medications properly checked for expiration dates. SDV (single dose vial) medications  used. ?Description of the Procedure: Protocol guidelines were followed. The patient was placed in position over the procedure table. The target area was identified and the area prepped in the usual manner. Skin & deep

## 2021-09-14 ENCOUNTER — Telehealth: Payer: Self-pay | Admitting: *Deleted

## 2021-09-14 NOTE — Telephone Encounter (Signed)
Post procedure call;  patient reports that she is doing well.  

## 2021-09-22 ENCOUNTER — Telehealth: Payer: Self-pay | Admitting: Student in an Organized Health Care Education/Training Program

## 2021-09-22 NOTE — Telephone Encounter (Signed)
Spoke with Herbie Baltimore, informed him of Dr. Elwyn Lade response. ?

## 2021-09-22 NOTE — Telephone Encounter (Signed)
Patient had injections recently and they have not helped with her pain. Son Loretta Webb is calling to see if there is anything that can be done to help with this. Please call Loretta Webb to advise ?

## 2021-10-11 ENCOUNTER — Encounter: Payer: Self-pay | Admitting: Student in an Organized Health Care Education/Training Program

## 2021-10-11 ENCOUNTER — Ambulatory Visit
Payer: Medicare Other | Attending: Student in an Organized Health Care Education/Training Program | Admitting: Student in an Organized Health Care Education/Training Program

## 2021-10-11 DIAGNOSIS — G894 Chronic pain syndrome: Secondary | ICD-10-CM | POA: Diagnosis not present

## 2021-10-11 DIAGNOSIS — G8929 Other chronic pain: Secondary | ICD-10-CM

## 2021-10-11 DIAGNOSIS — M48062 Spinal stenosis, lumbar region with neurogenic claudication: Secondary | ICD-10-CM | POA: Diagnosis not present

## 2021-10-11 DIAGNOSIS — M5416 Radiculopathy, lumbar region: Secondary | ICD-10-CM

## 2021-10-11 DIAGNOSIS — M1612 Unilateral primary osteoarthritis, left hip: Secondary | ICD-10-CM | POA: Diagnosis not present

## 2021-10-11 NOTE — Progress Notes (Signed)
Patient: Loretta Webb  Service Category: E/M  Provider: Gillis Santa, MD  DOB: January 06, 1932  DOS: 10/11/2021  Location: Office  MRN: [id number]  Setting: Ambulatory outpatient  Referring Provider: Rusty Aus, MD  Type: Established Patient  Specialty: Interventional Pain Management  PCP: Rusty Aus, MD  Location: Remote location  Delivery: TeleHealth     Virtual Encounter - Pain Management PROVIDER NOTE: Information contained herein reflects review and annotations entered in association with encounter. Interpretation of such information and data should be left to medically-trained personnel. Information provided to patient can be located elsewhere in the medical record under "Patient Instructions". Document created using STT-dictation technology, any transcriptional errors that may result from process are unintentional.    Contact & Pharmacy Preferred: [phone] Home: [phone] (home) Mobile: There is no such number on file (mobile). E-mail: [email or web address] .[place], [place] [address] Phone: [phone] Fax: [phone]  [id number] [place], [place] [facility] AT [address] [place] [address] Phone: [phone] Fax: [phone]  [place], [place] [address] [place] [address] Phone: [phone] Fax: [phone]   Pre-screening  Loretta Webb offered "in-person" vs "virtual" encounter. She indicated preferring virtual for this encounter.   Reason COVID-19*  Social distancing based on CDC and AMA recommendations.   I contacted [place] on 10/11/2021 via telephone.      I clearly identified myself as Gillis Santa, MD. I verified that I was speaking with the correct person using two identifiers (Name: CORDELL COKE, and date of birth: Jul 14, 1931).  Consent I sought verbal advanced  consent from Jessica Priest for virtual visit interactions. I informed Loretta Webb of possible security and privacy concerns, risks, and limitations associated with providing "not-in-person" medical evaluation and management services. I also informed Loretta Webb of the availability of "in-person" appointments. Finally, I informed her that there would be a charge for the virtual visit and that she could be  personally, fully or partially, financially responsible for it. Loretta Webb expressed understanding and agreed to proceed.   Historic Elements   Loretta Webb is a 88 y.o. year old, female patient evaluated today after our last contact on 09/22/2021. Loretta Webb  has a past medical history of Arthritis, Chronic fatigue, DM2 (diabetes mellitus, type 2) ([place]), Esophageal stricture, Fibromyalgia, GERD (gastroesophageal reflux disease), Hypertension, Lumbar stenosis, Osteoporosis, and Sleep apnea. She also  has a past surgical history that includes knee replacements; Abdominal hysterectomy; Rotator cuff repair; and Appendectomy. Loretta Webb has a current medication list which includes the following prescription(s): acetaminophen, aspirin, cholecalciferol, vitamin b-12, diclofenac sodium, digoxin, donepezil, duloxetine, magnesium, melatonin, metoprolol tartrate, midodrine, mirtazapine, pantoprazole, polyethylene glycol, quetiapine, senna-docusate, and pravastatin. She  reports that she has never smoked. She has never used smokeless tobacco. She reports that she does not drink alcohol and does not use drugs. Loretta Webb has No Known Allergies.   HPI  Today, she is being contacted for a post-procedure assessment.   Post-procedure evaluation   Type: Trans-Foraminal Epidural Steroid Injection (Lumbar) #1  Laterality: Left  Level: L5           Imaging: Fluoroscopic guidance Anesthesia: Local anesthesia (1-2% Lidocaine) Anxiolysis: None                 Sedation: None. DOS: 09/13/2021  Performed by: Gillis Santa, MD  Purpose: Diagnostic/Therapeutic Indications: Lumbar radicular pain severe enough to impact quality of life or function. 1. Chronic radicular lumbar pain   2. Lumbar stenosis with neurogenic claudication   3. Chronic pain syndrome    NAS-11 Pain score:   Pre-procedure: 2 /10   Post-procedure: 0-No pain (getting up//moving)/10      Effectiveness:  Initial hour after procedure: 100 %  Subsequent 4-6 hours post-procedure: 100 % Analgesia past initial 6 hours: 100 % (left hip and leg has pain all the way to the ankle.  back is better.  She is also c/o right hip.)  Ongoing improvement:  Analgesic:  75% Function: Somewhat improved ROM: Somewhat improved   Laboratory Chemistry Profile   Renal Lab Results  Component Value Date   BUN 21 01/06/2021   CREATININE 0.96 01/07/2021   BCR 10 (L) 09/03/2019   GFRAA 38 (L) 09/03/2019   GFRNONAA 57 (L) 01/07/2021    Hepatic Lab Results  Component Value Date   AST 27 01/06/2021   ALT 11 01/06/2021   ALBUMIN 3.8 01/06/2021   ALKPHOS 43 01/06/2021   LIPASE 42 09/11/2015    Electrolytes Lab Results  Component Value Date   NA 136 01/06/2021   K 3.6 01/06/2021   CL 101 01/06/2021   CALCIUM 9.0 01/06/2021   MG 2.4 01/06/2021    Bone Lab Results  Component Value Date   25OHVITD1 39 09/03/2019   25OHVITD2 3.4 09/03/2019   25OHVITD3 36 09/03/2019    Inflammation (CRP: Acute Phase) (ESR: Chronic Phase) Lab Results  Component Value Date   CRP <1 09/03/2019   ESRSEDRATE 17 09/03/2019   LATICACIDVEN 1.3 01/08/2021         Note: Above Lab results reviewed.  Imaging  DG PAIN CLINIC C-ARM 1-60 MIN NO REPORT Fluoro was used, but no Radiologist interpretation will be provided.  Please refer to "NOTES" tab for provider progress note.  Assessment  The primary encounter diagnosis was Chronic radicular lumbar pain. Diagnoses of Lumbar stenosis with neurogenic claudication, Chronic pain syndrome, and Primary osteoarthritis  of left hip were also pertinent to this visit.  Plan of Care   Good benefit with lumbar epidural steroid injection in regards to her low back and radiating leg pain.  Is having increased left hip pain that is worse with weightbearing related to hip osteoarthritis.  She is considering a left hip injection but wants to hold off at this time.  I have placed a as needed order as below if the patient would like to follow-up for a hip injection if she has worsening pain.  Debbie, patient's daughter endorsed understanding.  We can also repeat her lumbar epidural steroid injection in the future if she has return of her radicular pain.   Orders:  Orders Placed This Encounter  Procedures   HIP INJECTION    Standing Status:   Standing    Number of Occurrences:   2    Standing Expiration Date:   04/12/2022    Scheduling Instructions:     Left hip injection, as needed, patient will call.   Follow-up plan:   Return for PRN left hip injection.        Recent Visits Date Type Provider Dept  09/13/21 Procedure visit Gillis Santa, MD [facility]  Showing recent visits within past 90 days and meeting all other requirements Today's Visits Date Type Provider Dept  10/11/21 Office Visit Gillis Santa, MD [facility]  Showing  today's visits and meeting all other requirements Future Appointments No visits were found meeting these conditions. Showing future appointments within next 90 days and meeting all other requirements  I discussed the assessment and treatment plan with the patient. The patient was provided an opportunity to ask questions and all were answered. The patient agreed with the plan and demonstrated an understanding of the instructions.  Patient advised to call back or seek an in-person evaluation if the symptoms or condition worsens.  Duration of encounter: 54mnutes.  Note by: BGillis Santa MD Date: 10/11/2021; Time: 2:35 PM

## 2021-11-11 ENCOUNTER — Telehealth: Payer: Self-pay

## 2021-11-11 NOTE — Telephone Encounter (Signed)
111:17 AM: Palliative care SW outreached patient/family for monthly telephonic visit and to schedule in person visit.   Call unsuccessful. SW left HIPPA compliant VM.  Awaiting return call.

## 2021-11-12 ENCOUNTER — Other Ambulatory Visit: Payer: Self-pay | Admitting: Internal Medicine

## 2021-11-12 DIAGNOSIS — R222 Localized swelling, mass and lump, trunk: Secondary | ICD-10-CM

## 2021-11-18 ENCOUNTER — Ambulatory Visit
Admission: RE | Admit: 2021-11-18 | Discharge: 2021-11-18 | Disposition: A | Discharge status: Home / Self Care | Payer: Medicare Other | Source: Ambulatory Visit | Attending: Internal Medicine | Admitting: Internal Medicine

## 2021-11-18 DIAGNOSIS — R222 Localized swelling, mass and lump, trunk: Secondary | ICD-10-CM

## 2021-11-18 MED ORDER — IOPAMIDOL (ISOVUE-300) INJECTION 61%
75.0000 mL | Freq: Once | INTRAVENOUS | Status: AC | PRN
Start: 2021-11-18 — End: 2021-11-18
  Administered 2021-11-18: 75 mL via INTRAVENOUS

## 2021-12-06 NOTE — Progress Notes (Unsigned)
Cardiology Office Note  Date:  12/07/2021   ID:  Loretta Webb, DOB 1931-09-10, MRN [id number]  PCP:  [name], MD   Chief Complaint  Patient presents with   9 month follow up     "Doing well." Medications reviewed by the patient verbally.      HPI:  [name] is a 88 year old woman with past medical history of Permanent atrial fibrillation Diabetes type 2 Fibromyalgia/chronic fatigue/sleep apnea/anxiety dementia Hospital admission January 06, 2021 for falls, syncope, atrial fibrillation, hypotension history Who presents for follow-up of her permanent atrial fibrillation  Last seen in clinic November 2022 Lives at home, lots of support from family Family lives out of town but they travel in to check on her  She reports that she stopped midodrine on her own Daughter does pill box Does not miss per family/daughter who presents with her today Reports she is taking digoxin 3 times a week reduced dose for prior elevated digoxin level 1.7  Not driving,  Does not get out of the house much, no falls in the past year  Asymptomatic from her atrial fibrillation Not on blood thinner Long discussion concerning previous trial of Eliquis, patient does not remember but daughter reports that Eliquis made her feel poorly but she was also on other medications at the time.  Patient stopped all medications at that time  Seen in the hospital August 2021, was not taking her medications at that time Had stopped Eliquis, Coreg, digoxin was in atrial fibrillation with RVR rate 130  EKG personally reviewed by myself on todays visit Atrial fibrillation rate 84 bpm nonspecific ST and T wave abnormality  Other past medical history In 2022,fall witnessed by PT, second fall/syncope unwitnessed On arrival to the emergency room blood pressure low 90 systolic,dehydration Improvement of blood pressure with IV fluids concern for sepsis and she was started on broad-spectrum antibiotics urine  culture negative, blood culture so far negative, no pneumonia on chest x-ray  At the time of discharge she was started on midodrine for low blood pressure, Digoxin for rate control with metoprolol   PMH:   has a past medical history of Arthritis, Chronic fatigue, DM2 (diabetes mellitus, type 2) (HCC), Esophageal stricture, Fibromyalgia, GERD (gastroesophageal reflux disease), Hypertension, Lumbar stenosis, Osteoporosis, and Sleep apnea.  PSH:    Past Surgical History:  Procedure Laterality Date   ABDOMINAL HYSTERECTOMY     APPENDECTOMY     knee replacements     ROTATOR CUFF REPAIR      Current Outpatient Medications  Medication Sig Dispense Refill   acetaminophen (TYLENOL) 500 MG tablet Take 500 mg by mouth 2 (two) times daily as needed.     aspirin 81 MG chewable tablet Chew by mouth daily.     Cholecalciferol 25 MCG (1000 UT) tablet Take 1,000 Units by mouth once a week.     Cyanocobalamin (VITAMIN B-12) 5000 MCG TBDP Take 5,000 mcg by mouth daily.     diclofenac Sodium (VOLTAREN) 1 % GEL Apply 2 g topically 4 (four) times daily. 50 g 0   digoxin (LANOXIN) 0.125 MG tablet Take 0.5 tablets (0.0625 mg total) by mouth daily. (Patient taking differently: Take 0.125 mg by mouth. Monday, Wednesday, Friday) 46 tablet 3   donepezil (ARICEPT) 10 MG tablet Take 10 mg by mouth daily.     DULoxetine (CYMBALTA) 60 MG capsule Take 60 mg by mouth daily.      Magnesium 250 MG TABS Take 250 mg by mouth daily.  MELATONIN ER PO Take by mouth at bedtime.     metoprolol tartrate (LOPRESSOR) 50 MG tablet Take 1 tablet (50 mg total) by mouth 2 (two) times daily.     midodrine (PROAMATINE) 10 MG tablet Take 1 tablet (10 mg total) by mouth 3 (three) times daily with meals. For low BP (6 Am, 11 Am , 3 -4 pm), ok to take without food 270 tablet 1   mirtazapine (REMERON) 30 MG tablet Take 30 mg by mouth at bedtime.     pantoprazole (PROTONIX) 40 MG tablet Take 40 mg by mouth daily.     polyethylene glycol  (MIRALAX / GLYCOLAX) 17 g packet Take 17 g by mouth 2 (two) times daily. 14 each 0   pravastatin (PRAVACHOL) 40 MG tablet Take 40 mg by mouth at bedtime.      QUEtiapine (SEROQUEL) 25 MG tablet Take 25 mg by mouth at bedtime.     senna-docusate (SENOKOT-S) 8.6-50 MG tablet Take 1 tablet by mouth 2 (two) times daily.     mirtazapine (REMERON) 15 MG tablet Take 30 mg by mouth at bedtime. (Patient not taking: Reported on 12/07/2021)     No current facility-administered medications for this visit.     Allergies:   Patient has no known allergies.   Social History:  The patient  reports that she has never smoked. She has never used smokeless tobacco. She reports that she does not drink alcohol and does not use drugs.   Family History:   family history includes Cancer in her father, mother, and sister; Cerebral aneurysm in her father; Depression in her sister; Diabetes in her father.    Review of Systems: Review of Systems  Constitutional: Negative.   HENT: Negative.    Respiratory: Negative.    Cardiovascular: Negative.   Gastrointestinal: Negative.   Musculoskeletal: Negative.   Neurological: Negative.   Psychiatric/Behavioral: Negative.    All other systems reviewed and are negative.   PHYSICAL EXAM: VS:  BP 120/70 (BP Location: Left Arm, Patient Position: Sitting, Cuff Size: Normal)   Pulse 84   Ht 5\' 2"  (1.575 m)   Wt 161 lb (73 kg)   LMP  (LMP Unknown)   SpO2 94%   BMI 29.45 kg/m  , BMI Body mass index is 29.45 kg/m. Constitutional:  oriented to person, place, and time. No distress.  Presents in a wheelchair HENT:  Head: Grossly normal Eyes:  no discharge. No scleral icterus.  Neck: No JVD, no carotid bruits  Cardiovascular: Irregularly irregular no murmurs appreciated Pulmonary/Chest: Clear to auscultation bilaterally, no wheezes or rails Abdominal: Soft.  no distension.  no tenderness.  Musculoskeletal: Normal range of motion, large mass protruding from right shoulder,  nontender Neurological:  normal muscle tone. Coordination normal. No atrophy Skin: Skin warm and dry Psychiatric: normal affect, pleasant  Recent Labs: 01/06/2021: ALT 11; BUN 21; Magnesium 2.4; Potassium 3.6; Sodium 136 01/07/2021: Creatinine, Ser 0.96; Hemoglobin 10.3; Platelets 317; TSH 3.528    Lipid Panel No results found for: "CHOL", "HDL", "LDLCALC", "TRIG"    Wt Readings from Last 3 Encounters:  12/07/21 161 lb (73 kg)  09/13/21 140 lb (63.5 kg)  07/01/21 144 lb (65.3 kg)      ASSESSMENT AND PLAN:  Problem List Items Addressed This Visit       Cardiology Problems   DM type 2 with diabetic mixed hyperlipidemia (HCC)   Relevant Orders   EKG 12-Lead   Hypotension   Hyperlipidemia, mixed   Relevant Orders  EKG 12-Lead   Other Visit Diagnoses     Permanent atrial fibrillation ([place])    -  Primary   Relevant Orders   EKG 12-Lead   Medication management       Relevant Orders   Digoxin level     Hypotension Blood pressure appears to have stabilized, weight has been trending upwards likely helping She has taken herself off midodrine for unclear reasons, likely has not had orthostasis symptoms or falls We will take midodrine off her medication list  Atrial fib, persistent Rate controlled Low dose digoxin, metoprolol Anticoagulation previously held for balance issues Further discussion today, no recent falls, blood pressure stable Could reconsider starting Eliquis 5 twice daily (she does not meet cutoff for low dosing as age over 60 kg and normal renal function) Risk and benefit discussed with patient and daughter.  There was some concern she had side effects on the Eliquis but details unclear. Recommend she talk with Dr. Sabra Heck. Currently living alone, " by a thread" per the daughter  Diabetes type 2 not on insulin Weight stable A1C 6.7 in 2022, has new labs pending next week  Hyperlipidemia On statin,  Total chol 152, LDL 58 in 2022   Total encounter  time more than 40 minutes  Greater than 50% was spent in counseling and coordination of care with the patient    Signed, Esmond Plants, M.D., Ph.D. [place], [place]

## 2021-12-07 ENCOUNTER — Ambulatory Visit (INDEPENDENT_AMBULATORY_CARE_PROVIDER_SITE_OTHER): Payer: Medicare Other | Admitting: Cardiovascular Disease

## 2021-12-07 ENCOUNTER — Encounter: Payer: Self-pay | Admitting: Cardiovascular Disease

## 2021-12-07 VITALS — BP 120/70 | HR 84 | Ht 62.0 in | Wt 161.0 lb

## 2021-12-07 DIAGNOSIS — Z79899 Other long term (current) drug therapy: Secondary | ICD-10-CM

## 2021-12-07 DIAGNOSIS — I959 Hypotension, unspecified: Secondary | ICD-10-CM | POA: Diagnosis not present

## 2021-12-07 DIAGNOSIS — E1169 Type 2 diabetes mellitus with other specified complication: Secondary | ICD-10-CM

## 2021-12-07 DIAGNOSIS — I4821 Permanent atrial fibrillation: Secondary | ICD-10-CM | POA: Diagnosis not present

## 2021-12-07 DIAGNOSIS — E782 Mixed hyperlipidemia: Secondary | ICD-10-CM | POA: Diagnosis not present

## 2021-12-07 NOTE — Patient Instructions (Signed)
Medication Instructions:  No changes  If you need a refill on your cardiac medications before your next appointment, please call your pharmacy.   Lab work: Digoxin level at [facility]  Testing/Procedures: No new testing needed  Follow-Up: At [facility], you and your health needs are our priority.  As part of our continuing mission to provide you with exceptional heart care, we have created designated Provider Care Teams.  These Care Teams include your primary Cardiologist (physician) and Advanced Practice Providers (APPs -  Physician Assistants and Nurse Practitioners) who all work together to provide you with the care you need, when you need it.  You will need a follow up appointment in 12 months  Providers on your designated Care Team:   Nicolasa Ducking, NP Eula Listen, PA-C Cadence Fransico Michael, [place]  COVID-19 Vaccine Information can be found at: [email or web address] For questions related to vaccine distribution or appointments, please email [email or web address] or call [phone].

## 2021-12-11 ENCOUNTER — Encounter: Payer: Self-pay | Admitting: Cardiovascular Disease

## 2021-12-13 ENCOUNTER — Encounter: Payer: Self-pay | Admitting: Student in an Organized Health Care Education/Training Program

## 2021-12-13 ENCOUNTER — Telehealth: Payer: Self-pay

## 2021-12-13 NOTE — Telephone Encounter (Signed)
Dr Cherylann Ratel, the patients daughter called after we told her that she needed for her mom top have a F2F visit.  She said  that her mom has a mass on her right shoulder and she has been going back and forth from doctor to doctor and bent over and pulled a muscle.  She said she is worn out and she cant bring her for another appointment.  She said she is only taking 1/4 of a pill a couple times a day and has only had 10 pills prescribed that have lasted over a year.  The doctors do not know yet what the mass is and she goes to ortho on Monday.  The pain she is having is from a pulled muscle in her back from bending over.  I told her I would give you the message.

## 2021-12-20 ENCOUNTER — Other Ambulatory Visit
Admission: RE | Admit: 2021-12-20 | Discharge: 2021-12-20 | Disposition: A | Discharge status: Home / Self Care | Payer: Medicare Other | Source: Ambulatory Visit | Attending: Family Medicine | Admitting: Family Medicine

## 2021-12-20 DIAGNOSIS — M19011 Primary osteoarthritis, right shoulder: Secondary | ICD-10-CM | POA: Insufficient documentation

## 2021-12-20 DIAGNOSIS — R2231 Localized swelling, mass and lump, right upper limb: Secondary | ICD-10-CM | POA: Diagnosis present

## 2021-12-20 LAB — SYNOVIAL CELL COUNT + DIFF, W/ CRYSTALS
Crystals, Fluid: NONE SEEN
Eosinophils-Synovial: 2 %
Lymphocytes-Synovial Fld: 57 %
Monocyte-Macrophage-Synovial Fluid: 17 %
Neutrophil, Synovial: 24 %
WBC, Synovial: 735 /mm3 — ABNORMAL HIGH (ref 0–200)

## 2021-12-22 ENCOUNTER — Encounter: Payer: Self-pay | Admitting: Student in an Organized Health Care Education/Training Program

## 2022-01-05 ENCOUNTER — Other Ambulatory Visit: Payer: Self-pay

## 2022-01-05 ENCOUNTER — Encounter: Payer: Self-pay | Admitting: Student in an Organized Health Care Education/Training Program

## 2022-01-05 ENCOUNTER — Ambulatory Visit
Admission: RE | Admit: 2022-01-05 | Discharge: 2022-01-05 | Disposition: A | Discharge status: Home / Self Care | Payer: Medicare Other | Source: Ambulatory Visit | Attending: Student in an Organized Health Care Education/Training Program | Admitting: Student in an Organized Health Care Education/Training Program

## 2022-01-05 ENCOUNTER — Ambulatory Visit
Payer: Medicare Other | Attending: Student in an Organized Health Care Education/Training Program | Admitting: Student in an Organized Health Care Education/Training Program

## 2022-01-05 VITALS — BP 114/87 | HR 79 | Temp 97.3°F | Resp 22 | Ht 63.0 in | Wt 155.0 lb

## 2022-01-05 DIAGNOSIS — M533 Sacrococcygeal disorders, not elsewhere classified: Secondary | ICD-10-CM | POA: Diagnosis not present

## 2022-01-05 DIAGNOSIS — M25511 Pain in right shoulder: Secondary | ICD-10-CM | POA: Diagnosis not present

## 2022-01-05 DIAGNOSIS — M47818 Spondylosis without myelopathy or radiculopathy, sacral and sacrococcygeal region: Secondary | ICD-10-CM

## 2022-01-05 DIAGNOSIS — M25551 Pain in right hip: Secondary | ICD-10-CM | POA: Insufficient documentation

## 2022-01-05 MED ORDER — ROPIVACAINE HCL 2 MG/ML IJ SOLN
INTRAMUSCULAR | Status: AC
  Filled 2022-01-05: qty 20

## 2022-01-05 MED ORDER — LIDOCAINE HCL 2 % IJ SOLN
20.0000 mL | Freq: Once | INTRAMUSCULAR | Status: AC
Start: 2022-01-05 — End: 2022-01-05
  Administered 2022-01-05: 400 mg

## 2022-01-05 MED ORDER — METHYLPREDNISOLONE ACETATE 40 MG/ML IJ SUSP
40.0000 mg | Freq: Once | INTRAMUSCULAR | Status: AC
Start: 2022-01-05 — End: 2022-01-05
  Administered 2022-01-05: 40 mg via INTRA_ARTICULAR

## 2022-01-05 MED ORDER — IOHEXOL 180 MG/ML  SOLN
INTRAMUSCULAR | Status: AC
  Filled 2022-01-05: qty 20

## 2022-01-05 MED ORDER — ROPIVACAINE HCL 2 MG/ML IJ SOLN
4.0000 mL | Freq: Once | INTRAMUSCULAR | Status: AC
  Administered 2022-01-05: 4 mL via INTRA_ARTICULAR

## 2022-01-05 MED ORDER — IOHEXOL 180 MG/ML  SOLN
10.0000 mL | Freq: Once | INTRAMUSCULAR | Status: AC
  Administered 2022-01-05: 10 mL via INTRA_ARTICULAR

## 2022-01-05 MED ORDER — LIDOCAINE HCL 2 % IJ SOLN
INTRAMUSCULAR | Status: AC
  Filled 2022-01-05: qty 20

## 2022-01-05 MED ORDER — METHYLPREDNISOLONE ACETATE 40 MG/ML IJ SUSP
INTRAMUSCULAR | Status: AC
  Filled 2022-01-05: qty 1

## 2022-01-05 NOTE — Progress Notes (Signed)
Safety precautions to be maintained throughout the outpatient stay will include: orient to surroundings, keep bed in low position, maintain call bell within reach at all times, provide assistance with transfer out of bed and ambulation.  

## 2022-01-05 NOTE — Patient Instructions (Signed)
____________________________________________________________________________________________  Post-Procedure Discharge Instructions  Instructions: Apply ice:  Purpose: This will minimize any swelling and discomfort after procedure.  When: Day of procedure, as soon as you get home. How: Fill a plastic sandwich bag with crushed ice. Cover it with a small towel and apply to injection site. How long: (15 min on, 15 min off) Apply for 15 minutes then remove x 15 minutes.  Repeat sequence on day of procedure, until you go to bed. Apply heat:  Purpose: To treat any soreness and discomfort from the procedure. When: Starting the next day after the procedure. How: Apply heat to procedure site starting the day following the procedure. How long: May continue to repeat daily, until discomfort goes away. Food intake: Start with clear liquids (like water) and advance to regular food, as tolerated.  Physical activities: Keep activities to a minimum for the first 8 hours after the procedure. After that, then as tolerated. Driving: If you have received any sedation, be responsible and do not drive. You are not allowed to drive for 24 hours after having sedation. Blood thinner: (Applies only to those taking blood thinners) You may restart your blood thinner 6 hours after your procedure. Insulin: (Applies only to Diabetic patients taking insulin) As soon as you can eat, you may resume your normal dosing schedule. Infection prevention: Keep procedure site clean and dry. Shower daily and clean area with soap and water. Post-procedure Pain Diary: Extremely important that this be done correctly and accurately. Recorded information will be used to determine the next step in treatment. For the purpose of accuracy, follow these rules: Evaluate only the area treated. Do not report or include pain from an untreated area. For the purpose of this evaluation, ignore all other areas of pain, except for the treated area. After  your procedure, avoid taking a long nap and attempting to complete the pain diary after you wake up. Instead, set your alarm clock to go off every hour, on the hour, for the initial 8 hours after the procedure. Document the duration of the numbing medicine, and the relief you are getting from it. Do not go to sleep and attempt to complete it later. It will not be accurate. If you received sedation, it is likely that you were given a medication that may cause amnesia. Because of this, completing the diary at a later time may cause the information to be inaccurate. This information is needed to plan your care. Follow-up appointment: Keep your post-procedure follow-up evaluation appointment after the procedure (usually 2 weeks for most procedures, 6 weeks for radiofrequencies). DO NOT FORGET to bring you pain diary with you.   Expect: (What should I expect to see with my procedure?) From numbing medicine (AKA: Local Anesthetics): Numbness or decrease in pain. You may also experience some weakness, which if present, could last for the duration of the local anesthetic. Onset: Full effect within 15 minutes of injected. Duration: It will depend on the type of local anesthetic used. On the average, 1 to 8 hours.  From steroids (Applies only if steroids were used): Decrease in swelling or inflammation. Once inflammation is improved, relief of the pain will follow. Onset of benefits: Depends on the amount of swelling present. The more swelling, the longer it will take for the benefits to be seen. In some cases, up to 10 days. Duration: Steroids will stay in the system x 2 weeks. Duration of benefits will depend on multiple posibilities including persistent irritating factors. Side-effects: If present, they   may typically last 2 weeks (the duration of the steroids). Frequent: Cramps (if they occur, drink Gatorade and take over-the-counter Magnesium 450-500 mg once to twice a day); water retention with temporary  weight gain; increases in blood sugar; decreased immune system response; increased appetite. Occasional: Facial flushing (red, warm cheeks); mood swings; menstrual changes. Uncommon: Long-term decrease or suppression of natural hormones; bone thinning. (These are more common with higher doses or more frequent use. This is why we prefer that our patients avoid having any injection therapies in other practices.)  Very Rare: Severe mood changes; psychosis; aseptic necrosis. From procedure: Some discomfort is to be expected once the numbing medicine wears off. This should be minimal if ice and heat are applied as instructed.  Call if: (When should I call?) You experience numbness and weakness that gets worse with time, as opposed to wearing off. New onset bowel or bladder incontinence. (Applies only to procedures done in the spine)  Emergency Numbers: Durning business hours (Monday - Thursday, 8:00 AM - 4:00 PM) (Friday, 9:00 AM - 12:00 Noon): [phone] After hours: [phone] NOTE: If you are having a problem and are unable connect with, or to talk to a provider, then go to your nearest urgent care or emergency department. If the problem is serious and urgent, please call 911. ____________________________________________________________________________________________  Sacroiliac (SI) Joint Injection Patient Information  Description: The sacroiliac joint connects the scrum (very low back and tailbone) to the ilium (a pelvic bone which also forms half of the hip joint).  Normally this joint experiences very little motion.  When this joint becomes inflamed or unstable low back and or hip and pelvis pain may result.  Injection of this joint with local anesthetics (numbing medicines) and steroids can provide diagnostic information and reduce pain.  This injection is performed with the aid of x-ray guidance into the tailbone area while you are lying on your stomach.   You may experience an  electrical sensation down the leg while this is being done.  You may also experience numbness.  We also may ask if we are reproducing your normal pain during the injection.  Conditions which may be treated SI injection:  Low back, buttock, hip or leg pain  Preparation for the Injection:  Do not eat any solid food or dairy products within 8 hours of your appointment.  You may drink clear liquids up to 3 hours before appointment.  Clear liquids include water, black coffee, juice or soda.  No milk or cream please. You may take your regular medications, including pain medications with a sip of water before your appointment.  Diabetics should hold regular insulin (if take separately) and take 1/2 normal NPH dose the morning of the procedure.  Carry some sugar containing items with you to your appointment. A driver must accompany you and be prepared to drive you home after your procedure. Bring all of your current medications with you. An IV may be inserted and sedation may be given at the discretion of the physician. A blood pressure cuff, EKG and other monitors will often be applied during the procedure.  Some patients may need to have extra oxygen administered for a short period.  You will be asked to provide medical information, including your allergies, prior to the procedure.  We must know immediately if you are taking blood thinners (like Coumadin/Warfarin) or if you are allergic to IV iodine contrast (dye).  We must know if you could possible be pregnant.  Possible side   effects:  Bleeding from needle site Infection (rare, may require surgery) Nerve injury (rare) Numbness & tingling (temporary) A brief convulsion or seizure Light-headedness (temporary) Pain at injection site (several days) Decreased blood pressure (temporary) Weakness in the leg (temporary)   Call if you experience:  New onset weakness or numbness of an extremity below the injection site that last more than 8  hours. Hives or difficulty breathing ( go to the emergency room) Inflammation or drainage at the injection site Any new symptoms which are concerning to you  Please note:  Although the local anesthetic injected can often make your back/ hip/ buttock/ leg feel good for several hours after the injections, the pain will likely return.  It takes 3-7 days for steroids to work in the sacroiliac area.  You may not notice any pain relief for at least that one week.  If effective, we will often do a series of three injections spaced 3-6 weeks apart to maximally decrease your pain.  After the initial series, we generally will wait some months before a repeat injection of the same type.  If you have any questions, please call [phone] [facility]   

## 2022-01-05 NOTE — Progress Notes (Signed)
PROVIDER NOTE: Information contained herein reflects review and annotations entered in association with encounter. Interpretation of such information and data should be left to medically-trained personnel. Information provided to patient can be located elsewhere in the medical record under "Patient Instructions". Document created using STT-dictation technology, any transcriptional errors that may result from process are unintentional.    Patient: Loretta Webb  Service Category: Procedure  Provider: Edward Jolly, MD  DOB: [birth year]  DOS: 01/05/2022  Location: [facility]  MRN: [id number]  Setting: Ambulatory - outpatient  Referring Provider: No ref. provider found  Type: Established Patient  Specialty: Interventional Pain Management  PCP: [name], MD   Primary Reason for Visit: Interventional Pain Management Treatment. CC: Hip Pain (Right hip and buttocks and down the leg to just above the knee ) and Shoulder Pain (Right )  Procedure:          Anesthesia, Analgesia, Anxiolysis:  Type: Therapeutic Sacroiliac Joint Steroid Injection #1  Region: Inferior Lumbosacral Region Level: PIIS (Posterior Inferior Iliac Spine) Laterality: Right  Type: Local Anesthesia  Local Anesthetic: Lidocaine 1-2%  Position: Prone           Indications:  RIGHT  sacroiliac joint pain  Pain Score: Pre-procedure: 5/10 Post-procedure: 0-No pain/10   Pre-op H&P Assessment:  Ms. Hayne is a 88 y.o. (year old), female patient, seen today for interventional treatment. She  has a past surgical history that includes knee replacements; Abdominal hysterectomy; Rotator cuff repair; and Appendectomy. Ms. Foland has a current medication list which includes the following prescription(s): acetaminophen, aspirin, cholecalciferol, vitamin b-12, diclofenac sodium, digoxin, donepezil, duloxetine, magnesium, melatonin, metoprolol tartrate, mirtazapine, pantoprazole, polyethylene glycol, quetiapine,  senna-docusate, mirtazapine, and pravastatin. Her primarily concern today is the Hip Pain (Right hip and buttocks and down the leg to just above the knee ) and Shoulder Pain (Right )  Initial Vital Signs:  Pulse/HCG Rate: (!) 120  Temp: (!) 97.3 F (36.3 C) Resp: 16 BP: (!) 122/108 SpO2: 95 %  BMI: Estimated body mass index is 27.46 kg/m as calculated from the following:   Height as of this encounter: 5\' 3"  (1.6 m).   Weight as of this encounter: 155 lb (70.3 kg).  Risk Assessment: Allergies: Reviewed. She has No Known Allergies.  Allergy Precautions: None required Coagulopathies: Reviewed. None identified.  Blood-thinner therapy: None at this time Active Infection(s): Reviewed. None identified. Ms. Tess is afebrile  Site Confirmation: Ms. Verrill was asked to confirm the procedure and laterality before marking the site Procedure checklist: Completed Consent: Before the procedure and under the influence of no sedative(s), amnesic(s), or anxiolytics, the patient was informed of the treatment options, risks and possible complications. To fulfill our ethical and legal obligations, as recommended by the American Medical Association's Code of Ethics, I have informed the patient of my clinical impression; the nature and purpose of the treatment or procedure; the risks, benefits, and possible complications of the intervention; the alternatives, including doing nothing; the risk(s) and benefit(s) of the alternative treatment(s) or procedure(s); and the risk(s) and benefit(s) of doing nothing. The patient was provided information about the general risks and possible complications associated with the procedure. These may include, but are not limited to: failure to achieve desired goals, infection, bleeding, organ or nerve damage, allergic reactions, paralysis, and death. In addition, the patient was informed of those risks and complications associated to the procedure, such as failure to decrease  pain; infection; bleeding; organ or nerve damage with subsequent damage to  sensory, motor, and/or autonomic systems, resulting in permanent pain, numbness, and/or weakness of one or several areas of the body; allergic reactions; (i.e.: anaphylactic reaction); and/or death. Furthermore, the patient was informed of those risks and complications associated with the medications. These include, but are not limited to: allergic reactions (i.e.: anaphylactic or anaphylactoid reaction(s)); adrenal axis suppression; blood sugar elevation that in diabetics may result in ketoacidosis or comma; water retention that in patients with history of congestive heart failure may result in shortness of breath, pulmonary edema, and decompensation with resultant heart failure; weight gain; swelling or edema; medication-induced neural toxicity; particulate matter embolism and blood vessel occlusion with resultant organ, and/or nervous system infarction; and/or aseptic necrosis of one or more joints. Finally, the patient was informed that Medicine is not an exact science; therefore, there is also the possibility of unforeseen or unpredictable risks and/or possible complications that may result in a catastrophic outcome. The patient indicated having understood very clearly. We have given the patient no guarantees and we have made no promises. Enough time was given to the patient to ask questions, all of which were answered to the patient's satisfaction. Ms. Pantaleon has indicated that she wanted to continue with the procedure. Attestation: I, the ordering provider, attest that I have discussed with the patient the benefits, risks, side-effects, alternatives, likelihood of achieving goals, and potential problems during recovery for the procedure that I have provided informed consent. Date  Time: 01/05/2022 12:53 PM  Pre-Procedure Preparation:  Monitoring: As per clinic protocol. Respiration, ETCO2, SpO2, BP, heart rate and rhythm monitor  placed and checked for adequate function Safety Precautions: Patient was assessed for positional Loretta and pressure points before starting the procedure. Time-out: I initiated and conducted the "Time-out" before starting the procedure, as per protocol. The patient was asked to participate by confirming the accuracy of the "Time Out" information. Verification of the correct person, site, and procedure were performed and confirmed by me, the nursing staff, and the patient. "Time-out" conducted as per Joint Commission's Universal Protocol (UP.01.01.01). Time: 1344  Description of Procedure:          Target Area: Inferior, posterior, aspect of the sacroiliac fissure Approach: Posterior, paraspinal, ipsilateral approach. Area Prepped: Entire Lower Lumbosacral Region DuraPrep (Iodine Povacrylex [0.7% available iodine] and Isopropyl Alcohol, 74% w/w) Safety Precautions: Aspiration looking for blood return was conducted prior to all injections. At no point did we inject any substances, as a needle was being advanced. No attempts were made at seeking any paresthesias. Safe injection practices and needle disposal techniques used. Medications properly checked for expiration dates. SDV (single dose vial) medications used. Description of the Procedure: Protocol guidelines were followed. The patient was placed in position over the procedure table. The target area was identified and the area prepped in the usual manner. Skin & deeper tissues infiltrated with local anesthetic. Appropriate amount of time allowed to pass for local anesthetics to take effect. The procedure needle was advanced under fluoroscopic guidance into the sacroiliac joint until a firm endpoint was obtained. Proper needle placement secured. Negative aspiration confirmed. Solution injected in intermittent fashion, asking for systemic symptoms every 0.5cc of injectate. The needles were then removed and the area cleansed, making sure to leave some of  the prepping solution back to take advantage of its long term bactericidal properties. Vitals:   01/05/22 1334 01/05/22 1339 01/05/22 1344 01/05/22 1349  BP: 138/87 (!) 141/86 107/77 114/87  Pulse: 87 86 81 79  Resp: (!) 24 (!) 22 (!)  23 (!) 22  Temp:      TempSrc:      SpO2: 97% 96% 97% 98%  Weight:      Height:        Start Time: 1344 hrs. End Time: 1346 hrs. Materials:  Needle(s) Type: Spinal Needle Gauge: 22G Length: 3.5-in Medication(s): Please see orders for medications and dosing details. 4 cc solution made of 3 cc of 0.2% ropivacaine, 1 cc of methylprednisolone, 40 mg/cc.  Injected into the right SI joint after contrast confirmation. Imaging Guidance (Non-Spinal):          Type of Imaging Technique: Fluoroscopy Guidance (Non-Spinal) Indication(s): Assistance in needle guidance and placement for procedures requiring needle placement in or near specific anatomical locations not easily accessible without such assistance. Exposure Time: Please see nurses notes. Contrast: Before injecting any contrast, we confirmed that the patient did not have an allergy to iodine, shellfish, or radiological contrast. Once satisfactory needle placement was completed at the desired level, radiological contrast was injected. Contrast injected under live fluoroscopy. No contrast complications. See chart for type and volume of contrast used. Fluoroscopic Guidance: I was personally present during the use of fluoroscopy. "Tunnel Vision Technique" used to obtain the best possible view of the target area. Parallax error corrected before commencing the procedure. "Direction-depth-direction" technique used to introduce the needle under continuous pulsed fluoroscopy. Once target was reached, antero-posterior, oblique, and lateral fluoroscopic projection used confirm needle placement in all planes. Images permanently stored in EMR. Interpretation: I personally interpreted the imaging intraoperatively. Adequate  needle placement confirmed in multiple planes. Appropriate spread of contrast into desired area was observed. No evidence of afferent or efferent intravascular uptake. Permanent images saved into the patient's record.  Post-operative Assessment:  Post-procedure Vital Signs:  Pulse/HCG Rate: 79  Temp: (!) 97.3 F (36.3 C) Resp: (!) 22 BP: 114/87 SpO2: 98 %  EBL: None  Complications: No immediate post-treatment complications observed by team, or reported by patient.  Note: The patient tolerated the entire procedure well. A repeat set of vitals were taken after the procedure and the patient was kept under observation following institutional policy, for this type of procedure. Post-procedural neurological assessment was performed, showing return to baseline, prior to discharge. The patient was provided with post-procedure discharge instructions, including a section on how to identify potential problems. Should any problems arise concerning this procedure, the patient was given instructions to immediately contact us, at any time, without hesitation. In any case, we plan to contact the patient by telephone for a follow-up status report regarding this interventional procedure.  Comments:  No additional relevant information.  Plan of Care  Orders:  Orders Placed This Encounter  Procedures   DG PAIN CLINIC C-ARM 1-60 MIN NO REPORT    Intraoperative interpretation by procedural physician at [facility].    Standing Status:   Standing    Number of Occurrences:   1    Order Specific Question:   Reason for exam:    Answer:   Assistance in needle guidance and placement for procedures requiring needle placement in or near specific anatomical locations not easily accessible without such assistance.   Medications ordered for procedure: Meds ordered this encounter  Medications   lidocaine (XYLOCAINE) 2 % (with pres) injection 400 mg   iohexol (OMNIPAQUE) 180 MG/ML injection 10 mL    Must  be Myelogram-compatible. If not available, you may substitute with a water-soluble, non-ionic, hypoallergenic, myelogram-compatible radiological contrast medium.   methylPREDNISolone acetate (DEPO-MEDROL) injection 40  mg   ropivacaine (PF) 2 mg/mL (0.2%) (NAROPIN) injection 4 mL   Medications administered: We administered lidocaine, iohexol, methylPREDNISolone acetate, and ropivacaine (PF) 2 mg/mL (0.2%).  See the medical record for exact dosing, route, and time of administration.  Follow-up plan:   Return for Post Procedure Evaluation, virtual.    Recent Visits Date Type Provider Dept  10/11/21 Office Visit Edward Jolly, MD [facility]  Showing recent visits within past 90 days and meeting all other requirements Today's Visits Date Type Provider Dept  01/05/22 Procedure visit Edward Jolly, MD [facility]  Showing today's visits and meeting all other requirements Future Appointments Date Type Provider Dept  01/31/22 Appointment Edward Jolly, MD [facility]  Showing future appointments within next 90 days and meeting all other requirements  Disposition: Discharge home  Discharge (Date  Time): 01/05/2022; 1355 hrs.   Primary Care Physician: [name], MD Location: [facility] Note by: Edward Jolly, MD Date: 01/05/2022; Time: 2:05 PM  Disclaimer:  Medicine is not an exact science. The only guarantee in medicine is that nothing is guaranteed. It is important to note that the decision to proceed with this intervention was based on the information collected from the patient. The Data and conclusions were drawn from the patient's questionnaire, the interview, and the physical examination. Because the information was provided in large part by the patient, it cannot be guaranteed that it has not been purposely or unconsciously manipulated. Every effort has been made to obtain as much relevant data as possible for this  evaluation. It is important to note that the conclusions that lead to this procedure are derived in large part from the available data. Always take into account that the treatment will also be dependent on availability of resources and existing treatment guidelines, considered by other Pain Management Practitioners as being common knowledge and practice, at the time of the intervention. For Medico-Legal purposes, it is also important to point out that variation in procedural techniques and pharmacological choices are the acceptable norm. The indications, contraindications, technique, and results of the above procedure should only be interpreted and judged by a Board-Certified Interventional Pain Specialist with extensive familiarity and expertise in the same exact procedure and technique.

## 2022-01-06 ENCOUNTER — Telehealth: Payer: Self-pay

## 2022-01-06 NOTE — Telephone Encounter (Signed)
Pt was called and her daughter answered the phone. She stated that her mother was doing well and she will called back if needed. No problem was reported.

## 2022-01-31 ENCOUNTER — Ambulatory Visit
Payer: Medicare Other | Attending: Student in an Organized Health Care Education/Training Program | Admitting: Student in an Organized Health Care Education/Training Program

## 2022-01-31 ENCOUNTER — Encounter: Payer: Self-pay | Admitting: Student in an Organized Health Care Education/Training Program

## 2022-01-31 DIAGNOSIS — G8929 Other chronic pain: Secondary | ICD-10-CM

## 2022-01-31 DIAGNOSIS — M5416 Radiculopathy, lumbar region: Secondary | ICD-10-CM | POA: Diagnosis not present

## 2022-01-31 DIAGNOSIS — G894 Chronic pain syndrome: Secondary | ICD-10-CM | POA: Diagnosis not present

## 2022-01-31 DIAGNOSIS — M48062 Spinal stenosis, lumbar region with neurogenic claudication: Secondary | ICD-10-CM

## 2022-01-31 NOTE — Progress Notes (Signed)
Patient: Loretta Webb  Service Category: E/M  Provider: Gillis Santa, MD  DOB: 1931/11/08  DOS: 01/31/2022  Location: Office  MRN: [id number]  Setting: Ambulatory outpatient  Referring Provider: Rusty Aus, MD  Type: Established Patient  Specialty: Interventional Pain Management  PCP: Rusty Aus, MD  Location: Remote location  Delivery: TeleHealth     Virtual Encounter - Pain Management PROVIDER NOTE: Information contained herein reflects review and annotations entered in association with encounter. Interpretation of such information and data should be left to medically-trained personnel. Information provided to patient can be located elsewhere in the medical record under "Patient Instructions". Document created using STT-dictation technology, any transcriptional errors that may result from process are unintentional.    Contact & Pharmacy Preferred: [phone] Home: [phone] (home) Mobile: [phone] (mobile) E-mail: [email or web address]  [facility] - [place], [place] - [address] [place] [address] Phone: [phone] Fax: [phone]   Pre-screening  Loretta Webb offered "in-person" vs "virtual" encounter. She indicated preferring virtual for this encounter.   Reason COVID-19*  Social distancing based on CDC and AMA recommendations.   I contacted [place] on 01/31/2022 via telephone.      I clearly identified myself as Gillis Santa, MD. I verified that I was speaking with the correct person using two identifiers (Name: Loretta Webb, and date of birth: Nov 13, 1931).  Consent I sought verbal advanced consent from Jessica Priest for virtual visit interactions. I informed Loretta Webb of possible security and privacy concerns, risks, and limitations associated with providing "not-in-person" medical evaluation and management services. I also informed Loretta Webb of the availability of "in-person" appointments. Finally, I informed her that  there would be a charge for the virtual visit and that she could be  personally, fully or partially, financially responsible for it. Loretta Webb expressed understanding and agreed to proceed.   Historic Elements   Loretta Webb is a 88 y.o. year old, female patient evaluated today after our last contact on 01/05/2022. Loretta Webb  has a past medical history of Arthritis, Chronic fatigue, DM2 (diabetes mellitus, type 2) ([place]), Esophageal stricture, Fibromyalgia, GERD (gastroesophageal reflux disease), Hypertension, Lumbar stenosis, Osteoporosis, and Sleep apnea. She also  has a past surgical history that includes knee replacements; Abdominal hysterectomy; Rotator cuff repair; and Appendectomy. Loretta Webb has a current medication list which includes the following prescription(s): acetaminophen, aspirin, cholecalciferol, vitamin b-12, diclofenac sodium, digoxin, donepezil, duloxetine, magnesium, melatonin, metoprolol tartrate, mirtazapine, pantoprazole, polyethylene glycol, quetiapine, senna-docusate, mirtazapine, and pravastatin. She  reports that she has never smoked. She has never used smokeless tobacco. She reports that she does not drink alcohol and does not use drugs. Loretta Webb has No Known Allergies.   HPI  Today, she is being contacted for a post-procedure assessment.   Post-procedure evaluation   Type: Therapeutic Sacroiliac Joint Steroid Injection #1  Region: Inferior Lumbosacral Region Level: PIIS (Posterior Inferior Iliac Spine) Laterality: Right   Type: Local Anesthesia   Local Anesthetic: Lidocaine 1-2%   Position: Prone            Indications:   RIGHT  sacroiliac joint pain    NAS-11 Pain score:   Pre-procedure: 2 /10   Post-procedure: 0-No pain (getting up//moving)/10       Effectiveness:  Initial hour after procedure: 100% Subsequent 4-6 hours post-procedure:100% Analgesia past initial 6 hours:100% Ongoing improvement:  Analgesic:  75% Function: Loretta Webb  reports improvement in function ROM: Loretta Webb reports  improvement in ROM   Laboratory Chemistry Profile   Renal Lab Results  Component Value Date   BUN 21 01/06/2021   CREATININE 0.96 01/07/2021   BCR 10 (L) 09/03/2019   GFRAA 38 (L) 09/03/2019   GFRNONAA 57 (L) 01/07/2021    Hepatic Lab Results  Component Value Date   AST 27 01/06/2021   ALT 11 01/06/2021   ALBUMIN 3.8 01/06/2021   ALKPHOS 43 01/06/2021   LIPASE 42 09/11/2015    Electrolytes Lab Results  Component Value Date   NA 136 01/06/2021   K 3.6 01/06/2021   CL 101 01/06/2021   CALCIUM 9.0 01/06/2021   MG 2.4 01/06/2021    Bone Lab Results  Component Value Date   25OHVITD1 39 09/03/2019   25OHVITD2 3.4 09/03/2019   25OHVITD3 36 09/03/2019    Inflammation (CRP: Acute Phase) (ESR: Chronic Phase) Lab Results  Component Value Date   CRP <1 09/03/2019   ESRSEDRATE 17 09/03/2019   LATICACIDVEN 1.3 01/08/2021         Note: Above Lab results reviewed.  Assessment  The primary encounter diagnosis was Chronic radicular lumbar pain. Diagnoses of Lumbar stenosis with neurogenic claudication and Chronic pain syndrome were also pertinent to this visit.  Plan of Care  Appropriate response to right SI joint and right piriformis injection that was done 01/05/2022. Debbie, patient's daughter states that she is having more radiating leg pain that goes down her posterior lateral leg.  She states that this is similar pain to what she was having prior to her transforaminal injection at left L5 that was done 09/13/2021.  She found this injection to be helpful, provided approximately 75% pain relief for 3 months.  She is now having radiating pain on both sides and would like to have this repeated.  Plan for bilateral L5 transforaminal ESI in 1 to 2 weeks.  We will do this without sedation.    Orders:  Orders Placed This Encounter  Procedures   Lumbar Transforaminal Epidural    Standing Status:   Future    Standing  Expiration Date:   05/02/2022    Scheduling Instructions:     Laterality: Bilateral L5     Timeframe: ASAP    Order Specific Question:   Where will this procedure be performed?    Answer:   [facility]   Follow-up plan:   Return in about 2 weeks (around 02/14/2022) for B/L L5 TF ESI, in clinic NS.    Recent Visits Date Type Provider Dept  01/05/22 Procedure visit Gillis Santa, MD [facility]  Showing recent visits within past 90 days and meeting all other requirements Today's Visits Date Type Provider Dept  01/31/22 Office Visit Gillis Santa, MD [facility]  Showing today's visits and meeting all other requirements Future Appointments No visits were found meeting these conditions. Showing future appointments within next 90 days and meeting all other requirements  I discussed the assessment and treatment plan with the patient. The patient was provided an opportunity to ask questions and all were answered. The patient agreed with the plan and demonstrated an understanding of the instructions.  Patient advised to call back or seek an in-person evaluation if the symptoms or condition worsens.  Duration of encounter: 30 minutes.  Note by: Gillis Santa, MD Date: 01/31/2022; Time: 4:09 PM

## 2022-02-16 ENCOUNTER — Ambulatory Visit: Payer: Medicare Other | Admitting: Student in an Organized Health Care Education/Training Program

## 2022-12-27 ENCOUNTER — Telehealth: Payer: Self-pay

## 2022-12-27 NOTE — Telephone Encounter (Signed)
Patient daughter called in to schedule her mother appointment and check on her mother referral.

## 2023-01-03 NOTE — Progress Notes (Unsigned)
Loretta Amy, PA-C [address]  [address]  [place], [place] [address]  Main: [phone]  Fax: [phone]   Gastroenterology Consultation  Referring Provider:     [name], NP Primary Care Physician:  Loretta Penton, MD Primary Gastroenterologist:  Loretta Amy, PA-C  Reason for Consultation:     Leaking bowels        HPI:   Loretta Webb is a 89 y.o. y/o female referred for consultation & management  by Loretta Penton, MD.    She is a resident at [facility] assisted living.  She is here today with her daughter Loretta Webb.  Patient has moderate memory impairment.  Patient has had worsening diarrhea for 6 weeks.  Multiple watery stools daily.  Has to wear depends due to fecal incontinence.  Denies abdominal pain, nausea, vomiting, rectal bleeding, fever, chills, or weight loss.  No recent stool studies.  She does have history of lifelong chronic constipation.  She has had abdominal x-ray in the past which has showed overflow diarrhea from constipation.  Currently treated with MiraLAX and Senokot daily.  This treatment is not working well.  She has fecal incontinence with no urge for bowel movement.  Her daughter is concerned about possible pancreatic insufficiency.  She was started on Levbid (hyoscyamine) 2 weeks ago.    Referred to evaluate leaking bowels. PMH of DM2, HTN, anemia, B12 deficiency, HLD, osteoporosis, neuropathy, OSA, chronic insomnia.  Labs 11/11/2022 showed WBC 10.5, hemoglobin 15.8.  Glucose 153, BUN 20, creatinine 1.1, GFR 48.  Normal LFTs and electrolytes.  We do not have any records of previous colonoscopy.  Past Medical History:  Diagnosis Date   Arthritis    Chronic fatigue    DM2 (diabetes mellitus, type 2) (HCC)    Esophageal stricture    Fibromyalgia    GERD (gastroesophageal reflux disease)    Hypertension    Lumbar stenosis    Osteoporosis    Sleep apnea     Past Surgical History:  Procedure Laterality Date   ABDOMINAL  HYSTERECTOMY     APPENDECTOMY     knee replacements     ROTATOR CUFF REPAIR      Prior to Admission medications   Medication Sig Start Date End Date Taking? Authorizing Provider  acetaminophen (TYLENOL) 500 MG tablet Take 500 mg by mouth 2 (two) times daily as needed.    [provider]  aspirin 81 MG chewable tablet Chew by mouth daily.    [provider]  Cholecalciferol 25 MCG (1000 UT) tablet Take 1,000 Units by mouth once a week.    [provider]  Cyanocobalamin (VITAMIN B-12) 5000 MCG TBDP Take 5,000 mcg by mouth daily.    [provider]  diclofenac Sodium (VOLTAREN) 1 % GEL Apply 2 g topically 4 (four) times daily. 01/12/21   Marrion Coy, MD  digoxin (LANOXIN) 0.125 MG tablet Take 0.5 tablets (0.0625 mg total) by mouth daily. Patient taking differently: Take 0.125 mg by mouth. Monday, Wednesday, Friday 02/08/21   Antonieta Iba, MD  donepezil (ARICEPT) 10 MG tablet Take 10 mg by mouth daily. 10/27/20   [provider]  DULoxetine (CYMBALTA) 60 MG capsule Take 60 mg by mouth daily.  04/24/14   [provider]  Magnesium 250 MG TABS Take 250 mg by mouth daily.    [provider]  MELATONIN ER PO Take by mouth at bedtime.    [provider]  metoprolol tartrate (LOPRESSOR)  50 MG tablet Take 1 tablet (50 mg total) by mouth 2 (two) times daily. 01/11/21   Lynn Ito, MD  mirtazapine (REMERON) 15 MG tablet Take 30 mg by mouth at bedtime. Patient not taking: Reported on 12/07/2021 09/24/20   [provider]  mirtazapine (REMERON) 30 MG tablet Take 30 mg by mouth at bedtime. 10/23/21   [provider]  pantoprazole (PROTONIX) 40 MG tablet Take 40 mg by mouth daily. 11/18/20   [provider]  polyethylene glycol (MIRALAX / GLYCOLAX) 17 g packet Take 17 g by mouth 2 (two) times daily. 01/11/21   Lynn Ito, MD  pravastatin (PRAVACHOL) 40 MG tablet Take 40 mg by mouth at bedtime.  10/13/14 12/07/21   [provider]  QUEtiapine (SEROQUEL) 25 MG tablet Take 25 mg by mouth at bedtime.    [provider]  senna-docusate (SENOKOT-S) 8.6-50 MG tablet Take 1 tablet by mouth 2 (two) times daily. 01/11/21   Lynn Ito, MD    Family History  Problem Relation Age of Onset   Diabetes Father    Cancer Father    Cerebral aneurysm Father    Cancer Mother    Cancer Sister    Depression Sister      Social History   Tobacco Use   Smoking status: Never   Smokeless tobacco: Never  Vaping Use   Vaping status: Never Used  Substance Use Topics   Alcohol use: No    Alcohol/week: 0.0 standard drinks of alcohol   Drug use: No    Allergies as of 01/04/2023   (No Known Allergies)    Review of Systems:    All systems reviewed and negative except where noted in HPI.   Physical Exam:  BP 114/74 (BP Location: Left Arm, Patient Position: Sitting, Cuff Size: Normal)   Pulse 87   Temp 97.7 F (36.5 C) (Oral)   Ht 5\' 2"  (1.575 m)   Wt 159 lb (72.1 kg)   LMP  (LMP Unknown)   BMI 29.08 kg/m  No LMP recorded (lmp unknown). Patient has had a hysterectomy.  General:   Febrile elderly female, pleasant and cooperative in NAD; moderate memory impairment.  Walks with a walker. Lungs:  Respirations even and unlabored.  Clear throughout to auscultation.   No wheezes, crackles, or rhonchi. No acute distress. Heart:  Regular rate and rhythm; no murmurs, clicks, rubs, or gallops. Abdomen:  Normal bowel sounds.  No bruits.  Soft, and non-distended without masses, hepatosplenomegaly or hernias noted.  No Tenderness.  No guarding or rebound tenderness.     Imaging Studies: No results found.  Assessment and Plan:   Loretta Webb is a 89 y.o. y/o female has been referred for worsening diarrhea and fecal incontinence for 6 weeks.  History of chronic lifelong constipation.  Differential includes infectious diarrhea, overflow diarrhea from constipation, and pancreatic insufficiency.  I am  ordering stool studies and abdominal x-ray for further evaluation.  Based on these results, then we will decide about further treatment.  1.  Diarrhea  Stop MiraLAX and Senokot which may be causing diarrhea. Sool studies GI pathogen panel, C. difficile toxin PCR, Fecal pancreatic elastase. Treatment will be based on test results.  2.  Constipation  Order abdominal x-ray to evaluate for overflow diarrhea from constipation.  If she is constipated, consider Amitiza or Linzess prescription.  Follow up 4 weeks with Inetta Fermo.  Loretta Amy, PA-C

## 2023-01-04 ENCOUNTER — Ambulatory Visit
Admission: RE | Admit: 2023-01-04 | Discharge: 2023-01-04 | Disposition: A | Discharge status: Home / Self Care | Payer: Medicare Other | Source: Ambulatory Visit | Attending: Physician Assistant | Admitting: Physician Assistant

## 2023-01-04 ENCOUNTER — Encounter: Payer: Self-pay | Admitting: Physician Assistant

## 2023-01-04 ENCOUNTER — Ambulatory Visit: Payer: Medicare Other | Admitting: Physician Assistant

## 2023-01-04 VITALS — BP 114/74 | HR 87 | Temp 97.7°F | Ht 62.0 in | Wt 159.0 lb

## 2023-01-04 DIAGNOSIS — R197 Diarrhea, unspecified: Secondary | ICD-10-CM

## 2023-01-04 DIAGNOSIS — K5901 Slow transit constipation: Secondary | ICD-10-CM | POA: Insufficient documentation

## 2023-01-04 DIAGNOSIS — K5909 Other constipation: Secondary | ICD-10-CM

## 2023-01-04 NOTE — Patient Instructions (Signed)
We are ordering Abdominal Xray to check Stool Burden. We are also ordering stool studies to evaluate for infections which can cause diarrhea and evaluate for pancreatic insufficiency which can cause diarrhea.  Please Stop Miralax and Senokot due to Diarrhea.  If abdominal x-ray shows moderate constipation, then we will start different treatment for constipation.  Further treatment will be based on above test results.

## 2023-01-12 ENCOUNTER — Telehealth: Payer: Self-pay

## 2023-01-12 MED ORDER — LUBIPROSTONE 24 MCG PO CAPS: 24.0000 ug | ORAL_CAPSULE | Freq: Two times a day (BID) | ORAL | 5 refills | Status: DC

## 2023-01-12 NOTE — Progress Notes (Signed)
Call and notify patient and her daughter Loretta Webb abdominal x-ray shows moderate constipation.  She appears to have overflow diarrhea from constipation.  No bowel obstruction.  I recommend start prescription Amitiza 24 mcg 1 tablet twice daily with food, #60, 5 RF to treat constipation.  It should help her diarrhea after her constipation improves.  Of note, patient is a resident at [facility] assisted living.  You will need to talk to her daughter Loretta Webb per [place].  Also need to send in Rx for Amitiza.

## 2023-01-12 NOTE — Telephone Encounter (Signed)
Left message on VM for patient and VM for daughter to return call to office.  Amitiza 24 mcg sent to pharmacy.   Call and notify patient and her daughter Loretta Webb abdominal x-ray shows moderate constipation.  She appears to have overflow diarrhea from constipation.  No bowel obstruction.  I recommend start prescription Amitiza 24 mcg 1 tablet twice daily with food, #60, 5 RF to treat constipation.  It should help her diarrhea after her constipation improves.  Of note, patient is a resident at [facility] assisted living.  You will need to talk to her daughter Loretta Webb per [place].  Also need to send in Rx for Amitiza.

## 2023-01-13 MED ORDER — LUBIPROSTONE 24 MCG PO CAPS: 24.0000 ug | ORAL_CAPSULE | Freq: Two times a day (BID) | ORAL | 5 refills | Status: AC

## 2023-01-13 NOTE — Telephone Encounter (Signed)
Spoke with patients daughter- faxed RX amitiza 24 mcg to [phone] [facility] facility .

## 2023-01-17 ENCOUNTER — Encounter: Payer: Self-pay | Admitting: Physician Assistant

## 2023-01-18 ENCOUNTER — Telehealth: Payer: Self-pay

## 2023-01-18 NOTE — Telephone Encounter (Signed)
Left message for patients daughter to call office.  Please call and notify patient's daughter Eunice Blase:  Recent abdominal x-ray shows moderate constipation.  She appears to have overflow diarrhea from constipation.  Typically if we treat the constipation, then the diarrhea will also improve.  Has patient been taking Amitiza 1 capsule twice daily?  How many BM's per day is she having?  [name], PA-C

## 2023-01-18 NOTE — Telephone Encounter (Signed)
Spoke with sheia at the [facility]- per Inetta Fermo hold Amitiza until diarrhea subsides. Then give one tablet amitiza once daily.fax order to [phone].  [facility] [phone] Fax # [phone]

## 2023-01-23 ENCOUNTER — Telehealth: Payer: Self-pay

## 2023-01-23 ENCOUNTER — Other Ambulatory Visit: Payer: Self-pay

## 2023-01-23 NOTE — Telephone Encounter (Signed)
Spoke with debbie-patients daughter-she continues to have diarrhea- I let her know I will be faxing order to [email or web address] to [facility].

## 2023-01-23 NOTE — Progress Notes (Signed)
Notify patient GI pathogen panel shows positive C. difficile toxin A/B.  All other infections are negative.  Ask patient if Loretta Webb is having diarrhea?  If so, then we will treat with vancomycin 125 Mg 1 tablet 4 times daily for 10 days, #40, no refills.  If not having diarrhea, then we do not need to treat with antibiotics.

## 2023-01-23 NOTE — Telephone Encounter (Signed)
Notify patient GI pathogen panel shows positive C. difficile toxin A/B.  All other infections are negative.  Ask patient if she is having diarrhea?  If so, then we will treat with vancomycin 125 Mg 1 tablet 4 times daily for 10 days, #40, no refills.  If not having diarrhea, then we do not need to treat with antibiotics.      Left detailed VM on Loretta Webb VM to return call to office. Also left message on patient;s daughter VM to call office.

## 2023-01-25 ENCOUNTER — Telehealth: Payer: Self-pay

## 2023-01-25 LAB — GI PROFILE, STOOL, PCR

## 2023-01-25 LAB — PANCREATIC ELASTASE, FECAL: Pancreatic Elastase, Fecal: 166 ug Elast./g — ABNORMAL LOW (ref >200)

## 2023-01-25 LAB — CALPROTECTIN, FECAL: Calprotectin, Fecal: 88 ug/g (ref 0–120)

## 2023-01-25 NOTE — Telephone Encounter (Signed)
spoke with the daughter and emailed written order for Creon to [email or web address]. Ok for patient to finish course of Vancomycin for positive c-diff. If patient is not having diarrhea at the end of treatment no need to start Creon.   Fecal pancreatic elastase stool test is low, consistent with pancreatic insufficiency.  I recommend start Creon 36,000 lipase units, take 2 with each meal and 1 with each snack, max 8 tablets/day, #300, 2 refills.  2.  Fecal calprotectin is normal.  No evidence of inflammatory bowel disease.  Keep follow-up appointment as scheduled.  [name], PA-C

## 2023-01-25 NOTE — Progress Notes (Signed)
Notify patient: 1.  Fecal pancreatic elastase stool test is low, consistent with pancreatic insufficiency.  I recommend start Creon 36,000 lipase units, take 2 with each meal and 1 with each snack, max 8 tablets/day, #300, 2 refills. 2.  Fecal calprotectin is normal.  No evidence of inflammatory bowel disease. Keep follow-up appointment as scheduled. [name], PA-C

## 2023-01-25 NOTE — Telephone Encounter (Signed)
Fecal pancreatic elastase stool test is low, consistent with pancreatic insufficiency.  I recommend start Creon 36,000 lipase units, take 2 with each meal and 1 with each snack, max 8 tablets/day, #300, 2 refills.  2.  Fecal calprotectin is normal.  No evidence of inflammatory bowel disease.  Keep follow-up appointment as scheduled.  [name], PA-C

## 2023-01-31 ENCOUNTER — Other Ambulatory Visit: Payer: Self-pay

## 2023-02-06 ENCOUNTER — Ambulatory Visit: Payer: Medicare Other | Admitting: Physician Assistant

## 2023-02-16 ENCOUNTER — Encounter: Payer: Self-pay | Admitting: Physician Assistant

## 2023-03-08 ENCOUNTER — Encounter: Payer: Self-pay | Admitting: Physician Assistant

## 2023-03-09 ENCOUNTER — Telehealth: Payer: Medicare Other | Admitting: Physician Assistant

## 2023-03-20 ENCOUNTER — Encounter: Payer: Self-pay | Admitting: Physician Assistant
